# Patient Record
Sex: Female | Born: 1965 | Race: Black or African American | Hispanic: No | Smoking: Former smoker
Health system: Southern US, Community
[De-identification: ages and names within clinical notes are randomized; demographics above are authoritative.]

## PROBLEM LIST (undated history)

## (undated) DIAGNOSIS — L309 Dermatitis, unspecified: Secondary | ICD-10-CM

## (undated) DIAGNOSIS — I10 Essential (primary) hypertension: Secondary | ICD-10-CM

## (undated) DIAGNOSIS — T7840XA Allergy, unspecified, initial encounter: Secondary | ICD-10-CM

## (undated) DIAGNOSIS — Z801 Family history of malignant neoplasm of trachea, bronchus and lung: Secondary | ICD-10-CM

## (undated) DIAGNOSIS — Z8042 Family history of malignant neoplasm of prostate: Secondary | ICD-10-CM

## (undated) DIAGNOSIS — Z803 Family history of malignant neoplasm of breast: Secondary | ICD-10-CM

## (undated) HISTORY — DX: Family history of malignant neoplasm of trachea, bronchus and lung: Z80.1

## (undated) HISTORY — DX: Family history of malignant neoplasm of breast: Z80.3

## (undated) HISTORY — DX: Allergy, unspecified, initial encounter: T78.40XA

## (undated) HISTORY — DX: Essential (primary) hypertension: I10

## (undated) HISTORY — PX: KNEE SURGERY: SHX244

## (undated) HISTORY — DX: Family history of malignant neoplasm of prostate: Z80.42

## (undated) HISTORY — PX: TUBAL LIGATION: SHX77

## (undated) HISTORY — PX: KNEE ARTHROSCOPY W/ ACL RECONSTRUCTION: SHX1858

## (undated) HISTORY — DX: Dermatitis, unspecified: L30.9

## (undated) HISTORY — PX: ABDOMINAL HYSTERECTOMY: SHX81

---

## 1998-01-06 ENCOUNTER — Emergency Department (HOSPITAL_COMMUNITY): Admission: EM | Admit: 1998-01-06 | Discharge: 1998-01-06 | Payer: Self-pay | Admitting: Emergency Medicine

## 1998-01-06 ENCOUNTER — Encounter: Payer: Self-pay | Admitting: Emergency Medicine

## 1998-07-23 ENCOUNTER — Ambulatory Visit (HOSPITAL_COMMUNITY): Admission: RE | Admit: 1998-07-23 | Discharge: 1998-07-23 | Payer: Self-pay | Admitting: *Deleted

## 1998-11-30 ENCOUNTER — Encounter: Payer: Self-pay | Admitting: Emergency Medicine

## 1998-11-30 ENCOUNTER — Emergency Department (HOSPITAL_COMMUNITY): Admission: EM | Admit: 1998-11-30 | Discharge: 1998-11-30 | Payer: Self-pay

## 1999-07-26 ENCOUNTER — Other Ambulatory Visit: Admission: RE | Admit: 1999-07-26 | Discharge: 1999-07-26 | Payer: Self-pay | Admitting: *Deleted

## 2001-10-03 ENCOUNTER — Other Ambulatory Visit: Admission: RE | Admit: 2001-10-03 | Discharge: 2001-10-03 | Payer: Self-pay | Admitting: *Deleted

## 2002-09-24 ENCOUNTER — Other Ambulatory Visit: Admission: RE | Admit: 2002-09-24 | Discharge: 2002-09-24 | Payer: Self-pay | Admitting: *Deleted

## 2004-03-30 ENCOUNTER — Other Ambulatory Visit: Admission: RE | Admit: 2004-03-30 | Discharge: 2004-03-30 | Payer: Self-pay | Admitting: Obstetrics and Gynecology

## 2004-11-01 ENCOUNTER — Other Ambulatory Visit: Admission: RE | Admit: 2004-11-01 | Discharge: 2004-11-01 | Payer: Self-pay | Admitting: Obstetrics and Gynecology

## 2005-04-21 ENCOUNTER — Other Ambulatory Visit: Admission: RE | Admit: 2005-04-21 | Discharge: 2005-04-21 | Payer: Self-pay | Admitting: Obstetrics and Gynecology

## 2008-05-14 ENCOUNTER — Encounter (INDEPENDENT_AMBULATORY_CARE_PROVIDER_SITE_OTHER): Payer: Self-pay | Admitting: Obstetrics and Gynecology

## 2008-05-14 ENCOUNTER — Ambulatory Visit (HOSPITAL_COMMUNITY): Admission: RE | Admit: 2008-05-14 | Discharge: 2008-05-15 | Payer: Self-pay | Admitting: Obstetrics and Gynecology

## 2010-06-24 LAB — CBC
HCT: 35.4 % — ABNORMAL LOW (ref 36.0–46.0)
HCT: 40.3 % (ref 36.0–46.0)
Hemoglobin: 11.9 g/dL — ABNORMAL LOW (ref 12.0–15.0)
Hemoglobin: 13.5 g/dL (ref 12.0–15.0)
MCHC: 33.5 g/dL (ref 30.0–36.0)
MCHC: 33.5 g/dL (ref 30.0–36.0)
MCV: 93.8 fL (ref 78.0–100.0)
MCV: 94.9 fL (ref 78.0–100.0)
Platelets: 182 10*3/uL (ref 150–400)
Platelets: 232 10*3/uL (ref 150–400)
RBC: 3.73 MIL/uL — ABNORMAL LOW (ref 3.87–5.11)
RBC: 4.3 MIL/uL (ref 3.87–5.11)
RDW: 12.3 % (ref 11.5–15.5)
RDW: 12.6 % (ref 11.5–15.5)
WBC: 12.1 10*3/uL — ABNORMAL HIGH (ref 4.0–10.5)
WBC: 6.7 10*3/uL (ref 4.0–10.5)

## 2010-06-24 LAB — PREGNANCY, URINE: Preg Test, Ur: NEGATIVE

## 2010-07-27 NOTE — H&P (Signed)
[name], [name]                ACCOUNT NO.:  [id number]   MEDICAL RECORD NO.:  [id number]          PATIENT TYPE:  AMB   LOCATION:  SDC                           FACILITY:  [facility]   PHYSICIAN:  Zenaida Niece, M.D.DATE OF BIRTH:  March 11, 1966   DATE OF ADMISSION:  05/14/2008  DATE OF DISCHARGE:                              HISTORY & PHYSICAL   CHIEF COMPLAINT:  Abnormal uterine bleeding and uterine myomas.   HISTORY OF PRESENT ILLNESS:  This is a 45 year old female para 2-0-0-2  who was last seen for an annual exam in March 2009.  At that time, she  was not having any significant problems.  She called in January of this  year saying for the last 2 months periods were lasting longer with  irregular bleeding and then spotting for days after.  She was seen in  February of this year for a pelvic ultrasound and endometrial biopsy.  The ultrasound revealed an anterior fibroid which was partially  submucosal.  Uterus was slightly enlarged, but an otherwise normal  ultrasound.  Endometrial biopsy sounded to 10 cm, got mostly blood, but  pathology reveals benign endometrium with breakdown.  All nonsurgical  and surgical options were discussed with the patient and she wishes to  proceed with definitive surgical therapy.  She is being admitted for  total laparoscopic hysterectomy at this time.   PAST OBSTETRICS HISTORY:  Two cesarean sections at term without  complications.   PAST GYNECOLOGICAL HISTORY:  No history of abnormal Pap smears or  sexually transmitted diseases.   PAST MEDICAL HISTORY:  No significant illnesses.   PAST SURGICAL HISTORY:  Cesarean section x2, bilateral tubal ligation  and knee surgery.   ALLERGIES:  None known.   CURRENT MEDICATIONS:  None.   REVIEW OF SYSTEMS:  She has currently normal bowel and bladder function.  No chest pain or shortness of breath.   FAMILY HISTORY:  Mom with breast cancer.   SOCIAL HISTORY:  She is divorced and she does smoke about  five  cigarettes a day.   PHYSICAL EXAMINATION:  VITAL SIGNS:  Weight is approximately 189 pounds,  blood pressure was 122/84.  GENERAL:  This is a well-developed female in no acute distress.  NECK:  Supple without lymphadenopathy or thyromegaly.  LUNGS:  Clear to auscultation.  HEART:  Regular rate and rhythm without murmur.  ABDOMEN:  Soft, nontender, nondistended without palpable masses.  She  does have a well-healed transverse scar.  EXTREMITIES:  No edema and are nontender.  PELVIC:  External genitalia has no lesions.  On pelvic exam she has  normal cervix with minimal uterine descensus.  On bimanual exam, the  uterus is slightly enlarged and nontender.  She has no adnexal masses  and again there is minimal descensus.   ASSESSMENT:  Abnormal uterine bleeding with uterine leiomyoma.  The  patient wishes to undergo definitive surgical therapy.  All surgical  options and all risks of surgery have been discussed, and she agrees to  proceed.   PLAN:  Admit the patient on March 3 for total laparoscopic hysterectomy.  Zenaida Niece, M.D.  Electronically Signed     TDM/MEDQ  D:  05/13/2008  T:  05/13/2008  Job:  [id number]

## 2010-07-27 NOTE — Op Note (Signed)
[name], [name]                ACCOUNT NO.:  [id number]   MEDICAL RECORD NO.:  [id number]          PATIENT TYPE:  OIB   LOCATION:  9316                          FACILITY:  [facility]   PHYSICIAN:  Zenaida Niece, M.D.DATE OF BIRTH:  [birth year]   DATE OF PROCEDURE:  05/14/2008  DATE OF DISCHARGE:                               OPERATIVE REPORT   PREOPERATIVE DIAGNOSES:  1. Abnormal uterine bleeding.  2. Uterine myoma.   POSTOPERATIVE DIAGNOSES:  1. Abnormal uterine bleeding.  2. Uterine myoma.   PROCEDURE:  Total laparoscopic hysterectomy.   SURGEON:  Zenaida Niece, MD   ASSISTANT:  Huel Cote, M.D.   ANESTHESIA:  General endotracheal tube.   FINDINGS:  She had a slightly enlarged uterus with normal tubes and  ovaries with evidence of prior tubal ligation.  She had a normal  appendix and upper abdomen.   SPECIMENS:  Uterus with cervix sent for routine pathology.   ESTIMATED BLOOD LOSS:  100 mL.   COMPLICATIONS:  None.   PROCEDURE IN DETAIL:  The patient was taken to the operating room and  placed in the dorsal supine position.  General anesthesia was induced.  Both arms were tucked to her sides and legs were placed in mobile  stirrups.  Abdomen, perineum, and vagina were then prepped and draped in  the usual sterile fashion and a Foley catheter was inserted.  A sponge  stick was placed into the vagina for location of the vaginal fornices  later.  Infraumbilical skin was infiltrated with 0.25% Marcaine and a  0.75 cm vertical incision was made.  The Veress needle was inserted into  the peritoneal cavity and placement confirmed by the water-drop testing  and opening pressure of 6 mmHg.  CO2 gas was insufflated to a pressure  of 14 mmHg and the Veress needle was removed.  A 5-mm trocar was then  introduced with direct visualization of the laparoscope.  Inspection  revealed the above-mentioned findings.  A 5-mm port was placed on the  right side under direct  visualization and a 10-mm port on the left side  also under direct visualization.  Uterus did appear to be amendable to  laparoscopic hysterectomy.  Using the Harmonic scalpel ACE, the left  utero-ovarian ligament, round ligament, and upper-broad ligaments were  taken down and the Harmonic scalpel was used to assure hemostasis.  The  anterior leaf of the broad ligament was incised to cross the anterior  portion of the uterus to help push the bladder inferior.  The uterine  artery was identified, skeletonized, and taken down with the Harmonic  scalpel ACE with adequate division and adequate hemostasis.  A similar  procedure was then performed on the right side.  While this was being  done, the assistant used a 5-mm tenaculum on the uterus for  mobilization.  Once the uterine arteries were taken down on the right  side, bleeding was controlled with Harmonic scalpel.  The bladder was  pushed further inferior.  I then took the Harmonic scalpel back to the  left side.  The sponge stick was placed  into the anterior fornix/cul-de-  sac and pushed up into the field.  Using the Harmonic scalpel ACE on  maximum power, I was able to enter the vagina and the anterior cul-de-  sac.  This incision was extended short ways on either side.  The uterus  was then flipped anterior after the pelvis was irrigated.  The sponge  stick was placed into the posterior cul-de-sac and again just below the  level of the uterosacral ligaments, I was able to enter the posterior  cul-de-sac with Harmonic scalpel ACE on maximum power.  Then, using the  Harmonic scalpel ACE on maximum and minimum power needed for hemostasis,  the anterior and posterior incisions were connected first on the left  side taking down cardinal ligament and uterosacral ligaments and then on  the right side doing the same thing.  This removed the uterus and cervix  from the vagina.  It was then necessary for me to put on overgloves and  go from  below.  Using Deaver retractors, I was able to see the uterus  and cervix in the vaginal incision.  These were grasped with large  tenaculum and pulled through and into the vagina with some difficulty.  However, I was eventually able to achieve this and remove the uterus  through the vagina.  A moist sponge was then placed into the vagina to  help maintain pneumoperitoneum.  I removed the overgloves.   The pelvis was irrigated.  I then used 0 Vicryl suture through the  laparoscope, starting from the patient's right side at the right angle,  and I did 4 figure-of-eight sutures across the vaginal cuff closing it  with adequate closure and adequate hemostasis.  I used extracorporeal  knot-tying with a knot pusher.  This achieved adequate closure and  adequate hemostasis.  Pelvis was irrigated.  Small areas of bleeding  were controlled with Harmonic scalpel ACE.  Both ureters were identified  and seen to peristalse well below any incision lines.  Pelvis was again  inspected and found to be hemostatic.  The 5-mm trocar in the right was  removed under direct visualization.  The 10-mm trocar on the left was  removed under direct visualization.  All gas was allowed to deflate from  the abdomen and the umbilical trocar was removed.  The skin incisions  were closed with interrupted subcuticular sutures of 4-0 Vicryl followed  by Dermabond.  The patient was awakened in the operating room after  tolerating the procedure well and was taken to the recovery room in  stable condition.  Counts were correct, she received Ancef 1 g IV at the  beginning of the procedure and had PAS hose on throughout the procedure.      Zenaida Niece, M.D.  Electronically Signed     TDM/MEDQ  D:  05/14/2008  T:  05/15/2008  Job:  [id number]

## 2012-08-07 ENCOUNTER — Ambulatory Visit (INDEPENDENT_AMBULATORY_CARE_PROVIDER_SITE_OTHER): Payer: BC Managed Care – PPO | Admitting: Family Medicine

## 2012-08-07 VITALS — BP 118/62 | HR 80 | Temp 97.8°F | Resp 16 | Ht 68.5 in | Wt 183.2 lb

## 2012-08-07 DIAGNOSIS — J329 Chronic sinusitis, unspecified: Secondary | ICD-10-CM

## 2012-08-07 MED ORDER — CETIRIZINE HCL 10 MG PO TABS: 10.0000 mg | ORAL_TABLET | Freq: Every day | ORAL | Status: DC

## 2012-08-07 MED ORDER — AMOXICILLIN-POT CLAVULANATE 875-125 MG PO TABS: 1.0000 | ORAL_TABLET | Freq: Two times a day (BID) | ORAL | Status: DC

## 2012-08-07 NOTE — Patient Instructions (Addendum)

## 2012-08-07 NOTE — Progress Notes (Signed)
SUBJECTIVE:  [name] is a 47 y.o. female who complains of congestion, sore throat, swollen glands, nasal blockage, post nasal drip, productive cough, headache, bilateral sinus pain and chills for 6 days. She denies a history of chest pain, fevers, nausea, vomiting, weakness and weight loss and denies a history of asthma. Patient has smoke cigarettes.   OBJECTIVE: She appears well, vital signs are as noted. Ears air fluid level noted, non infectious.  Throat and pharynx + erythema with PND.  Neck supple. No adenopathy in the neck. Nose is congested. Sinuses tender to palpation frontal bilateral. The chest is clear, without wheezes or rales.  ASSESSMENT:  sinusitis  PLAN: Symptomatic therapy suggested: push fluids, rest, return office visit prn if symptoms persist or worsen and augmentin and zyrtec given. Call or return to clinic prn if these symptoms worsen or fail to improve as anticipated.

## 2012-08-08 ENCOUNTER — Telehealth: Payer: Self-pay | Admitting: Radiology

## 2012-08-08 NOTE — Telephone Encounter (Signed)
Work note provided for her for today.

## 2012-10-03 LAB — HM MAMMOGRAPHY

## 2012-10-12 ENCOUNTER — Encounter: Payer: Self-pay | Admitting: *Deleted

## 2013-01-29 ENCOUNTER — Other Ambulatory Visit: Payer: Self-pay | Admitting: Family Medicine

## 2013-02-05 ENCOUNTER — Other Ambulatory Visit: Payer: Self-pay | Admitting: *Deleted

## 2013-02-05 DIAGNOSIS — R5381 Other malaise: Secondary | ICD-10-CM

## 2013-02-05 DIAGNOSIS — E785 Hyperlipidemia, unspecified: Secondary | ICD-10-CM

## 2013-02-06 ENCOUNTER — Other Ambulatory Visit: Payer: Self-pay

## 2013-02-06 LAB — CBC WITH DIFFERENTIAL/PLATELET
Basophils Absolute: 0 10*3/uL (ref 0.0–0.1)
Basophils Relative: 0 % (ref 0–1)
Eosinophils Absolute: 0.2 10*3/uL (ref 0.0–0.7)
Eosinophils Relative: 3 % (ref 0–5)
HCT: 36.7 % (ref 36.0–46.0)
Hemoglobin: 12 g/dL (ref 12.0–15.0)
Lymphocytes Relative: 40 % (ref 12–46)
Lymphs Abs: 2 10*3/uL (ref 0.7–4.0)
MCH: 29.6 pg (ref 26.0–34.0)
MCHC: 32.7 g/dL (ref 30.0–36.0)
MCV: 90.4 fL (ref 78.0–100.0)
Monocytes Absolute: 0.7 10*3/uL (ref 0.1–1.0)
Monocytes Relative: 14 % — ABNORMAL HIGH (ref 3–12)
Neutro Abs: 2.1 10*3/uL (ref 1.7–7.7)
Neutrophils Relative %: 43 % (ref 43–77)
Platelets: 249 10*3/uL (ref 150–400)
RBC: 4.06 MIL/uL (ref 3.87–5.11)
RDW: 13 % (ref 11.5–15.5)
WBC: 5 10*3/uL (ref 4.0–10.5)

## 2013-02-06 LAB — LIPID PANEL
Cholesterol: 180 mg/dL (ref 0–200)
HDL: 63 mg/dL (ref >39)
LDL Cholesterol: 104 mg/dL — ABNORMAL HIGH (ref 0–99)
Total CHOL/HDL Ratio: 2.9 Ratio
Triglycerides: 66 mg/dL (ref <150)
VLDL: 13 mg/dL (ref 0–40)

## 2013-02-06 LAB — COMPLETE METABOLIC PANEL WITH GFR
ALT: 20 U/L (ref 0–35)
AST: 23 U/L (ref 0–37)
Albumin: 4.3 g/dL (ref 3.5–5.2)
Alkaline Phosphatase: 68 U/L (ref 39–117)
BUN: 11 mg/dL (ref 6–23)
CO2: 27 mEq/L (ref 19–32)
Calcium: 9.7 mg/dL (ref 8.4–10.5)
Chloride: 103 mEq/L (ref 96–112)
Creat: 0.59 mg/dL (ref 0.50–1.10)
GFR, Est African American: >89 mL/min
GFR, Est Non African American: >89 mL/min
Glucose, Bld: 85 mg/dL (ref 70–99)
Potassium: 3.9 mEq/L (ref 3.5–5.3)
Sodium: 138 mEq/L (ref 135–145)
Total Bilirubin: 0.3 mg/dL (ref 0.3–1.2)
Total Protein: 7 g/dL (ref 6.0–8.3)

## 2013-02-07 LAB — TSH: TSH: 2.585 u[IU]/mL (ref 0.350–4.500)

## 2013-02-11 ENCOUNTER — Encounter: Payer: Self-pay | Admitting: Family Medicine

## 2013-02-11 ENCOUNTER — Ambulatory Visit (INDEPENDENT_AMBULATORY_CARE_PROVIDER_SITE_OTHER): Payer: BC Managed Care – PPO | Admitting: Family Medicine

## 2013-02-11 ENCOUNTER — Encounter (INDEPENDENT_AMBULATORY_CARE_PROVIDER_SITE_OTHER): Payer: Self-pay

## 2013-02-11 VITALS — BP 117/76 | HR 79 | Resp 16 | Ht 68.5 in | Wt 176.0 lb

## 2013-02-11 DIAGNOSIS — N760 Acute vaginitis: Secondary | ICD-10-CM

## 2013-02-11 DIAGNOSIS — F17200 Nicotine dependence, unspecified, uncomplicated: Secondary | ICD-10-CM

## 2013-02-11 DIAGNOSIS — B9689 Other specified bacterial agents as the cause of diseases classified elsewhere: Secondary | ICD-10-CM

## 2013-02-11 DIAGNOSIS — R5383 Other fatigue: Secondary | ICD-10-CM

## 2013-02-11 DIAGNOSIS — I10 Essential (primary) hypertension: Secondary | ICD-10-CM

## 2013-02-11 DIAGNOSIS — L259 Unspecified contact dermatitis, unspecified cause: Secondary | ICD-10-CM

## 2013-02-11 DIAGNOSIS — R5381 Other malaise: Secondary | ICD-10-CM | POA: Insufficient documentation

## 2013-02-11 DIAGNOSIS — A499 Bacterial infection, unspecified: Secondary | ICD-10-CM

## 2013-02-11 DIAGNOSIS — L309 Dermatitis, unspecified: Secondary | ICD-10-CM | POA: Insufficient documentation

## 2013-02-11 MED ORDER — CLOBETASOL PROPIONATE 0.05 % EX OINT: 1.0000 "application " | TOPICAL_OINTMENT | Freq: Two times a day (BID) | CUTANEOUS | Status: DC

## 2013-02-11 MED ORDER — BUPROPION HCL ER (XL) 150 MG PO TB24: 150.0000 mg | ORAL_TABLET | ORAL | Status: DC

## 2013-02-11 MED ORDER — LOSARTAN POTASSIUM-HCTZ 100-12.5 MG PO TABS: ORAL_TABLET | ORAL | Status: DC

## 2013-02-11 MED ORDER — BUPROPION HCL ER (XL) 300 MG PO TB24: 300.0000 mg | ORAL_TABLET | ORAL | Status: DC

## 2013-02-11 NOTE — Progress Notes (Signed)
   Subjective:    Patient ID: [name], female    DOB: March 12, 1966, 47 y.o.   MRN: [id number]  HPI  [name] is here today to go over her most recent lab results and to discuss the conditions listed below:   1)  Hypertension:  She is taking Hyzaar (100-12.5, 1/2 pill daily).  She was originally supposed to take 1 pill daily but she was having severe lightheadedness.    She started cutting her dose in half and has her side effects have improved.    2)  Eczema:  She has been using her Clobetasol (0.05%).  She needs a refill on it.    3)  BV:  She has done well taking Boric Acid and needs a refill on it.     Review of Systems  Genitourinary: Negative for vaginal discharge.  Skin: Positive for rash.  Neurological: Negative for light-headedness.  All other systems reviewed and are negative.   Past Medical History, Past Surgical History, Family History, Social History and Medications were reviewed and update at this visit.       Objective:   Physical Exam  Vitals reviewed. Constitutional: She appears well-developed and well-nourished. No distress.  HENT:  Head: Normocephalic.  Eyes: No scleral icterus.  Neck: No thyromegaly present.  Cardiovascular: Normal rate, regular rhythm and normal heart sounds.   Pulmonary/Chest: Effort normal and breath sounds normal.  Musculoskeletal: She exhibits no edema and no tenderness.  Neurological: She is alert.  Skin: Skin is warm and dry.  Psychiatric: She has a normal mood and affect. Her behavior is normal. Judgment and thought content normal.          Assessment & Plan:

## 2013-02-11 NOTE — Assessment & Plan Note (Signed)
She would like to quit smoking.  She was unable to take Chantix because it made her have hallucinations of spiders.

## 2013-02-11 NOTE — Patient Instructions (Signed)
1)  BP -  Keep track of your BP.  If you continue to lose weight and stop smoking, you can probably stop the losartan/HCT.    2)  Smoking Cessation - Try the Wellbutrin XL 150 mg.  You can remain on this dosage or increase to 300 mg in a month if needed.    Smoking Cessation, Tips for Success YOU CAN QUIT SMOKING If you are ready to quit smoking, congratulations! You have chosen to help yourself be healthier. Cigarettes bring nicotine, tar, carbon monoxide, and other irritants into your body. Your lungs, heart, and blood vessels will be able to work better without these poisons. There are many different ways to quit smoking. Nicotine gum, nicotine patches, a nicotine inhaler, or nicotine nasal spray can help with physical craving. Hypnosis, support groups, and medicines help break the habit of smoking. Here are some tips to help you quit for good.  Throw away all cigarettes.  Clean and remove all ashtrays from your home, work, and car.  On a card, write down your reasons for quitting. Carry the card with you and read it when you get the urge to smoke.  Cleanse your body of nicotine. Drink enough water and fluids to keep your urine clear or pale yellow. Do this after quitting to flush the nicotine from your body.  Learn to predict your moods. Do not let a bad situation be your excuse to have a cigarette. Some situations in your life might tempt you into wanting a cigarette.  Never have "just one" cigarette. It leads to wanting another and another. Remind yourself of your decision to quit.  Change habits associated with smoking. If you smoked while driving or when feeling stressed, try other activities to replace smoking. Stand up when drinking your coffee. Brush your teeth after eating. Sit in a different chair when you read the paper. Avoid alcohol while trying to quit, and try to drink fewer caffeinated beverages. Alcohol and caffeine may urge you to smoke.  Avoid foods and drinks that can  trigger a desire to smoke, such as sugary or spicy foods and alcohol.  Ask people who smoke not to smoke around you.  Have something planned to do right after eating or having a cup of coffee. Take a walk or exercise to perk you up. This will help to keep you from overeating.  Try a relaxation exercise to calm you down and decrease your stress. Remember, you may be tense and nervous for the first 2 weeks after you quit, but this will pass.  Find new activities to keep your hands busy. Play with a pen, coin, or rubber band. Doodle or draw things on paper.  Brush your teeth right after eating. This will help cut down on the craving for the taste of tobacco after meals. You can try mouthwash, too.  Use oral substitutes, such as lemon drops, carrots, a cinnamon stick, or chewing gum, in place of cigarettes. Keep them handy so they are available when you have the urge to smoke.  When you have the urge to smoke, try deep breathing.  Designate your home as a nonsmoking area.  If you are a heavy smoker, ask your caregiver about a prescription for nicotine chewing gum. It can ease your withdrawal from nicotine.  Reward yourself. Set aside the cigarette money you save and buy yourself something nice.  Look for support from others. Join a support group or smoking cessation program. Ask someone at home or at work  to help you with your plan to quit smoking.  Always ask yourself, "Do I need this cigarette or is this just a reflex?" Tell yourself, "Today, I choose not to smoke," or "I do not want to smoke." You are reminding yourself of your decision to quit, even if you do smoke a cigarette. HOW WILL I FEEL WHEN I QUIT SMOKING?  The benefits of not smoking start within days of quitting.  You may have symptoms of withdrawal because your body is used to nicotine (the addictive substance in cigarettes). You may crave cigarettes, be irritable, feel very hungry, cough often, get headaches, or have  difficulty concentrating.  The withdrawal symptoms are only temporary. They are strongest when you first quit but will go away within 10 to 14 days.  When withdrawal symptoms occur, stay in control. Think about your reasons for quitting. Remind yourself that these are signs that your body is healing and getting used to being without cigarettes.  Remember that withdrawal symptoms are easier to treat than the major diseases that smoking can cause.  Even after the withdrawal is over, expect periodic urges to smoke. However, these cravings are generally short-lived and will go away whether you smoke or not. Do not smoke!  If you relapse and smoke again, do not lose hope. Most smokers quit 3 times before they are successful.  If you relapse, do not give up! Plan ahead and think about what you will do the next time you get the urge to smoke. LIFE AS A NONSMOKER: MAKE IT FOR A MONTH, MAKE IT FOR LIFE Day 1: Hang this page where you will see it every day. Day 2: Get rid of all ashtrays, matches, and lighters. Day 3: Drink water. Breathe deeply between sips. Day 4: Avoid places with smoke-filled air, such as bars, clubs, or the smoking section of restaurants. Day 5: Keep track of how much money you save by not smoking. Day 6: Avoid boredom. Keep a good book with you or go to the movies. Day 7: Reward yourself! One week without smoking! Day 8: Make a dental appointment to get your teeth cleaned. Day 9: Decide how you will turn down a cigarette before it is offered to you. Day 10: Review your reasons for quitting. Day 11: Distract yourself. Stay active to keep your mind off smoking and to relieve tension. Take a walk, exercise, read a book, do a crossword puzzle, or try a new hobby. Day 12: Exercise. Get off the bus before your stop or use stairs instead of escalators. Day 13: Call on friends for support and encouragement. Day 14: Reward yourself! Two weeks without smoking! Day 15: Practice deep  breathing exercises. Day 16: Bet a friend that you can stay a nonsmoker. Day 17: Ask to sit in nonsmoking sections of restaurants. Day 18: Hang up "No Smoking" signs. Day 19: Think of yourself as a nonsmoker. Day 20: Each morning, tell yourself you will not smoke. Day 21: Reward yourself! Three weeks without smoking! Day 22: Think of smoking in negative ways. Remember how it stains your teeth, gives you bad breath, and leaves you short of breath. Day 23: Eat a nutritious breakfast. Day 24:Do not relive your days as a smoker. Day 25: Hold a pencil in your hand when talking on the telephone. Day 26: Tell all your friends you do not smoke. Day 27: Think about how much better food tastes. Day 28: Remember, one cigarette is one too many. Day 29: Take up a  hobby that will keep your hands busy. Day 30: Congratulations! One month without smoking! Give yourself a big reward. Your caregiver can direct you to community resources or hospitals for support, which may include:  Group support.  Education.  Hypnosis.  Subliminal therapy. Document Released: 11/27/2003 Document Revised: 05/23/2011 Document Reviewed: 08/16/2012 [facility] Patient Information 2014 [place], [place].

## 2013-02-11 NOTE — Assessment & Plan Note (Signed)
She was given a prescription for Boric Acid vaginal capsules.

## 2013-02-11 NOTE — Assessment & Plan Note (Signed)
Refilled clobetasol ointment. 

## 2013-02-11 NOTE — Assessment & Plan Note (Signed)
We'll see if she has more energy on Wellbutrin.

## 2013-02-11 NOTE — Assessment & Plan Note (Signed)
Her BP is perfect on 1/2 of her previous dosage.  If she continues to lose weight, she may be able to discontinue her Hyzaar.

## 2013-09-23 ENCOUNTER — Other Ambulatory Visit: Payer: Self-pay | Admitting: *Deleted

## 2013-09-23 DIAGNOSIS — I10 Essential (primary) hypertension: Secondary | ICD-10-CM

## 2013-09-24 ENCOUNTER — Other Ambulatory Visit: Payer: BC Managed Care – PPO

## 2013-09-24 LAB — CBC WITH DIFFERENTIAL/PLATELET
Basophils Absolute: 0 10*3/uL (ref 0.0–0.1)
Basophils Relative: 1 % (ref 0–1)
Eosinophils Absolute: 0.4 10*3/uL (ref 0.0–0.7)
Eosinophils Relative: 10 % — ABNORMAL HIGH (ref 0–5)
HCT: 37.2 % (ref 36.0–46.0)
Hemoglobin: 12.2 g/dL (ref 12.0–15.0)
Lymphocytes Relative: 48 % — ABNORMAL HIGH (ref 12–46)
Lymphs Abs: 2.1 10*3/uL (ref 0.7–4.0)
MCH: 29.3 pg (ref 26.0–34.0)
MCHC: 32.8 g/dL (ref 30.0–36.0)
MCV: 89.2 fL (ref 78.0–100.0)
Monocytes Absolute: 0.4 10*3/uL (ref 0.1–1.0)
Monocytes Relative: 9 % (ref 3–12)
Neutro Abs: 1.4 10*3/uL — ABNORMAL LOW (ref 1.7–7.7)
Neutrophils Relative %: 32 % — ABNORMAL LOW (ref 43–77)
Platelets: 265 10*3/uL (ref 150–400)
RBC: 4.17 MIL/uL (ref 3.87–5.11)
RDW: 13 % (ref 11.5–15.5)
WBC: 4.4 10*3/uL (ref 4.0–10.5)

## 2013-09-24 LAB — COMPLETE METABOLIC PANEL WITH GFR
ALT: 19 U/L (ref 0–35)
AST: 20 U/L (ref 0–37)
Albumin: 3.9 g/dL (ref 3.5–5.2)
Alkaline Phosphatase: 72 U/L (ref 39–117)
BUN: 8 mg/dL (ref 6–23)
CO2: 28 mEq/L (ref 19–32)
Calcium: 9.5 mg/dL (ref 8.4–10.5)
Chloride: 109 mEq/L (ref 96–112)
Creat: 0.69 mg/dL (ref 0.50–1.10)
GFR, Est African American: >89 mL/min
GFR, Est Non African American: >89 mL/min
Glucose, Bld: 92 mg/dL (ref 70–99)
Potassium: 4.9 mEq/L (ref 3.5–5.3)
Sodium: 141 mEq/L (ref 135–145)
Total Bilirubin: 0.3 mg/dL (ref 0.2–1.2)
Total Protein: 6.3 g/dL (ref 6.0–8.3)

## 2013-10-04 ENCOUNTER — Ambulatory Visit (INDEPENDENT_AMBULATORY_CARE_PROVIDER_SITE_OTHER): Payer: BC Managed Care – PPO | Admitting: Family Medicine

## 2013-10-04 ENCOUNTER — Encounter: Payer: Self-pay | Admitting: Family Medicine

## 2013-10-04 VITALS — BP 140/85 | HR 69 | Resp 16 | Ht 68.5 in | Wt 188.0 lb

## 2013-10-04 DIAGNOSIS — L309 Dermatitis, unspecified: Secondary | ICD-10-CM

## 2013-10-04 DIAGNOSIS — R5383 Other fatigue: Secondary | ICD-10-CM

## 2013-10-04 DIAGNOSIS — I10 Essential (primary) hypertension: Secondary | ICD-10-CM

## 2013-10-04 DIAGNOSIS — R5381 Other malaise: Secondary | ICD-10-CM

## 2013-10-04 DIAGNOSIS — F17200 Nicotine dependence, unspecified, uncomplicated: Secondary | ICD-10-CM

## 2013-10-04 DIAGNOSIS — L259 Unspecified contact dermatitis, unspecified cause: Secondary | ICD-10-CM

## 2013-10-04 MED ORDER — BUPROPION HCL ER (XL) 300 MG PO TB24: 300.0000 mg | ORAL_TABLET | ORAL | Status: DC

## 2013-10-04 MED ORDER — CLOBETASOL PROPIONATE 0.05 % EX OINT: 1.0000 "application " | TOPICAL_OINTMENT | Freq: Two times a day (BID) | CUTANEOUS | Status: AC

## 2013-10-04 MED ORDER — LOSARTAN POTASSIUM-HCTZ 100-12.5 MG PO TABS: ORAL_TABLET | ORAL | Status: DC

## 2013-10-04 NOTE — Progress Notes (Signed)
Subjective:    Patient ID: [name], female    DOB: Jan 04, 1966, 48 y.o.   MRN: [id number]  HPI  [name] is here today to follow up on her recent lab results. She is needing to get some medication refills.   1)  Hypertension:  She takes Hyzaar for her BP.  She was taking 1/2 tab daily but recently decreased this to 1/4 tab because she felt that the 1/2 dropped her pressure too much.  Her pressure is higher today because she has   2)  Mood/Fatigue:  She continues to do well on the Wellbutrin. She would like to remain on it.   3)  Eczema:  She would like to get a refill on the clobetasol ointment.   Review of Systems  Constitutional: Negative for activity change, appetite change and fatigue.  Cardiovascular: Negative for chest pain, palpitations and leg swelling.  Psychiatric/Behavioral: Negative for behavioral problems. The patient is not nervous/anxious.   All other systems reviewed and are negative.    Past Medical History  Diagnosis Date  . Allergy   . Hypertension   . Eczema      Past Surgical History  Procedure Laterality Date  . Cesarean section    . Abdominal hysterectomy    . Tubal ligation    . Knee arthroscopy w/ acl reconstruction Left      History   Social History Narrative   Marital Status: Divorced    Children:  Sons (2)    Pets: None   Living Situation: Lives alone   Occupation: Shipping Clerk (Financial trader)     Education: Programmer, systems    Tobacco Use/Exposure:  She has smoked as much 1 ppd.  She has smoked for > 20 years.  She is trying to quit and is down to about 3-5 cigs per day.     Alcohol Use: 4 days/months  (3 drinks)    Drug Use:  None   Diet:  Regular   Exercise:  Walking/ Zumba/Basketball, Biking   Hobbies: Dancing/ Cooking                  Family History  Problem Relation Age of Onset  . Cancer Mother 40    Breast Cancer  . Diabetes Father   . Kidney disease Father   . Hypertension Sister   . Heart disease  Maternal Grandmother   . Cancer Maternal Aunt     Lung Cancer  . Heart disease Maternal Aunt      No Known Allergies      Objective:   Physical Exam  Vitals reviewed. Constitutional: She appears well-developed and well-nourished. No distress.  HENT:  Head: Normocephalic.  Eyes: No scleral icterus.  Neck: No thyromegaly present.  Cardiovascular: Normal rate, regular rhythm and normal heart sounds.   Pulmonary/Chest: Effort normal and breath sounds normal.  Musculoskeletal: She exhibits no edema and no tenderness.  Neurological: She is alert.  Skin: Skin is warm and dry.  Psychiatric: She has a normal mood and affect. Her behavior is normal. Judgment and thought content normal.      Assessment & Plan:    [name] was seen today for medication management.  Diagnoses and associated orders for this visit:  Essential hypertension, benign - losartan-hydrochlorothiazide (HYZAAR) 100-12.5 MG per tablet; Take 1/2 - 1 tab daily  Eczema - clobetasol ointment (TEMOVATE) 0.05 %; Apply 1 application topically 2 (two) times daily.  Other malaise and fatigue - buPROPion (WELLBUTRIN XL) 300 MG  24 hr tablet; Take 1 tablet (300 mg total) by mouth every morning.  Tobacco use disorder - buPROPion (WELLBUTRIN XL) 300 MG 24 hr tablet; Take 1 tablet (300 mg total) by mouth every morning.   TIME SPENT "FACE TO FACE" WITH PATIENT -  30 MINS

## 2014-11-24 ENCOUNTER — Ambulatory Visit (INDEPENDENT_AMBULATORY_CARE_PROVIDER_SITE_OTHER): Payer: BLUE CROSS/BLUE SHIELD

## 2014-11-24 ENCOUNTER — Ambulatory Visit (INDEPENDENT_AMBULATORY_CARE_PROVIDER_SITE_OTHER): Payer: BLUE CROSS/BLUE SHIELD | Admitting: Physician Assistant

## 2014-11-24 VITALS — BP 138/72 | HR 80 | Temp 98.0°F | Resp 17 | Ht 69.0 in | Wt 172.0 lb

## 2014-11-24 DIAGNOSIS — Z23 Encounter for immunization: Secondary | ICD-10-CM

## 2014-11-24 DIAGNOSIS — M25562 Pain in left knee: Secondary | ICD-10-CM

## 2014-11-24 MED ORDER — MELOXICAM 15 MG PO TABS: 15.0000 mg | ORAL_TABLET | Freq: Every day | ORAL | Status: DC

## 2014-11-24 NOTE — Progress Notes (Signed)
[facility] [address] [place] [address] [phone]  Date:  11/24/2014   Name:  [name]   DOB:  December 06, 1965   MRN:  [id number]  PCP:  Ival Bible, MD    History of Present Illness:  [name] is a 49 y.o. female patient who presents to [facility] for chief complaint of left knee pain for the last 2 days. Patient was playing volleyball when she jumped up and she is went back down to the ground twisted her knee. She felt a popping sensation and pain and immediately ensued followed by swelling. She has been able to ambulate very little. She has pain along the lateral side of her knee as well as the top of her knee. There is no numbness or tingling. There has been some generalized swelling. She has tried Epsom salts to no avail. She is also used ice packs. Patient has had multiple surgeries on this left knee repairing her anterior cruciate ligament through arthroscopy as well as a repair to her meniscus.  Patient Active Problem List   Diagnosis Date Noted  . Essential hypertension, benign 02/11/2013  . Other malaise and fatigue 02/11/2013  . Tobacco use disorder 02/11/2013  . Eczema 02/11/2013  . Bacterial vaginosis 02/11/2013    Past Medical History  Diagnosis Date  . Allergy   . Hypertension   . Eczema     Past Surgical History  Procedure Laterality Date  . Cesarean section    . Abdominal hysterectomy    . Tubal ligation    . Knee arthroscopy w/ acl reconstruction Left     Social History  Substance Use Topics  . Smoking status: Current Some Day Smoker -- 0.25 packs/day for 36 years    Types: Cigarettes  . Smokeless tobacco: Never Used     Comment: She is trying to quit.    . Alcohol Use: 0.0 oz/week    0 Standard drinks or equivalent per week     Comment: Occasional     Family History  Problem Relation Age of Onset  . Cancer Mother 65    Breast Cancer  . Diabetes Father   . Kidney disease Father   . Hypertension Sister   .  Heart disease Maternal Grandmother   . Cancer Maternal Aunt     Lung Cancer  . Heart disease Maternal Aunt     No Known Allergies  Medication list has been reviewed and updated.  Current Outpatient Prescriptions on File Prior to Visit  Medication Sig Dispense Refill  . losartan-hydrochlorothiazide (HYZAAR) 100-12.5 MG per tablet Take 1/2 - 1 tab daily 30 tablet 5  . buPROPion (WELLBUTRIN XL) 300 MG 24 hr tablet Take 1 tablet (300 mg total) by mouth every morning. 30 tablet 5   No current facility-administered medications on file prior to visit.    ROS ROS otherwise unremarkable unless listed above.  Physical Examination: BP 138/72 mmHg  Pulse 80  Temp(Src) 98 F (36.7 C) (Oral)  Resp 17  Ht 5\' 9"  (1.753 m)  Wt 172 lb (78.019 kg)  BMI 25.39 kg/m2  SpO2 98% Ideal Body Weight: Weight in (lb) to have BMI = 25: 168.9  Physical Exam  Constitutional: She is oriented to person, place, and time. She appears well-developed and well-nourished. No distress.  HENT:  Head: Normocephalic and atraumatic.  Right Ear: External ear normal.  Left Ear: External ear normal.  Eyes: Conjunctivae and EOM are normal. Pupils are equal, round,  and reactive to light.  Cardiovascular: Normal rate.   Pulmonary/Chest: Effort normal. No respiratory distress.  Musculoskeletal:       Left knee: She exhibits decreased range of motion (Not able to ambulate.  Normal strength against resisted extension.  Resisted flexion sparks increased pain to lateral side.), swelling and abnormal meniscus (Negative mcmurrary). Tenderness found. Lateral joint line tenderness noted. No medial joint line and no patellar tendon tenderness noted. LCL: Appears to be some laxity and tenderness with adduction.  Swelling along the anterior knee along both sides of otye  Neurological: She is alert and oriented to person, place, and time.  Skin: She is not diaphoretic.  Psychiatric: She has a normal mood and affect. Her behavior is  normal.   [facility] reading (PRIMARY) by  Dr. Lorelei Pont: Degerative chagnes and hardware in place.  Assessment and Plan: 49 year old female with past medical history left knee surgery for apparent torn anterior cruciate ligament and meniscus is here today for left knee pain for 2 days. -At this time orthopedic consult is appreciated. She was referred back to Dr. Lynann Bologna of [place] in [place].  Ice 3 times for 15 minutes per day. -Work restrictions to [employer] duty given at this time, where this can then be followed by ortho specialist.   Left knee pain - Plan: DG Knee Complete 4 Views Left, Ambulatory referral to Orthopedic Surgery, meloxicam (MOBIC) 15 MG tablet, CANCELED: DG Foot Complete Left  Influenza vaccine needed - Plan: Flu Vaccine QUAD 36+ mos IM  Ivar Drape, PA-C [facility] 9/12/20168:53 PM

## 2014-11-24 NOTE — Patient Instructions (Addendum)
Please take medication as prescribed avoiding naproxen, ibuprofen, etc.  If you need another medication for pain, use tylenol. Ice the knee 3-4 times for 15 minutes per day. Rest the knee. Stay in the immobilizer while moving at this time.  You may remove at night.   Knee Pain The knee is the complex joint between your thigh and your lower leg. It is made up of bones, tendons, ligaments, and cartilage. The bones that make up the knee are:  The femur in the thigh.  The tibia and fibula in the lower leg.  The patella or kneecap riding in the groove on the lower femur. CAUSES  Knee pain is a common complaint with many causes. A few of these causes are:  Injury, such as:  A ruptured ligament or tendon injury.  Torn cartilage.  Medical conditions, such as:  Gout  Arthritis  Infections  Overuse, over training, or overdoing a physical activity. Knee pain can be minor or severe. Knee pain can accompany debilitating injury. Minor knee problems often respond well to self-care measures or get well on their own. More serious injuries may need medical intervention or even surgery. SYMPTOMS The knee is complex. Symptoms of knee problems can vary widely. Some of the problems are:  Pain with movement and weight bearing.  Swelling and tenderness.  Buckling of the knee.  Inability to straighten or extend your knee.  Your knee locks and you cannot straighten it.  Warmth and redness with pain and fever.  Deformity or dislocation of the kneecap. DIAGNOSIS  Determining what is wrong may be very straight forward such as when there is an injury. It can also be challenging because of the complexity of the knee. Tests to make a diagnosis may include:  Your caregiver taking a history and doing a physical exam.  Routine X-rays can be used to rule out other problems. X-rays will not reveal a cartilage tear. Some injuries of the knee can be diagnosed by:  Arthroscopy a surgical technique by  which a small video camera is inserted through tiny incisions on the sides of the knee. This procedure is used to examine and repair internal knee joint problems. Tiny instruments can be used during arthroscopy to repair the torn knee cartilage (meniscus).  Arthrography is a radiology technique. A contrast liquid is directly injected into the knee joint. Internal structures of the knee joint then become visible on X-ray film.  An MRI scan is a non X-ray radiology procedure in which magnetic fields and a computer produce two- or three-dimensional images of the inside of the knee. Cartilage tears are often visible using an MRI scanner. MRI scans have largely replaced arthrography in diagnosing cartilage tears of the knee.  Blood work.  Examination of the fluid that helps to lubricate the knee joint (synovial fluid). This is done by taking a sample out using a needle and a syringe. TREATMENT The treatment of knee problems depends on the cause. Some of these treatments are:  Depending on the injury, proper casting, splinting, surgery, or physical therapy care will be needed.  Give yourself adequate recovery time. Do not overuse your joints. If you begin to get sore during workout routines, back off. Slow down or do fewer repetitions.  For repetitive activities such as cycling or running, maintain your strength and nutrition.  Alternate muscle groups. For example, if you are a weight lifter, work the upper body on one day and the lower body the next.  Either tight or weak  muscles do not give the proper support for your knee. Tight or weak muscles do not absorb the stress placed on the knee joint. Keep the muscles surrounding the knee strong.  Take care of mechanical problems.  If you have flat feet, orthotics or special shoes may help. See your caregiver if you need help.  Arch supports, sometimes with wedges on the inner or outer aspect of the heel, can help. These can shift pressure away from  the side of the knee most bothered by osteoarthritis.  A brace called an "unloader" brace also may be used to help ease the pressure on the most arthritic side of the knee.  If your caregiver has prescribed crutches, braces, wraps or ice, use as directed. The acronym for this is PRICE. This means protection, rest, ice, compression, and elevation.  Nonsteroidal anti-inflammatory drugs (NSAIDs), can help relieve pain. But if taken immediately after an injury, they may actually increase swelling. Take NSAIDs with food in your stomach. Stop them if you develop stomach problems. Do not take these if you have a history of ulcers, stomach pain, or bleeding from the bowel. Do not take without your caregiver's approval if you have problems with fluid retention, heart failure, or kidney problems.  For ongoing knee problems, physical therapy may be helpful.  Glucosamine and chondroitin are over-the-counter dietary supplements. Both may help relieve the pain of osteoarthritis in the knee. These medicines are different from the usual anti-inflammatory drugs. Glucosamine may decrease the rate of cartilage destruction.  Injections of a corticosteroid drug into your knee joint may help reduce the symptoms of an arthritis flare-up. They may provide pain relief that lasts a few months. You may have to wait a few months between injections. The injections do have a small increased risk of infection, water retention, and elevated blood sugar levels.  Hyaluronic acid injected into damaged joints may ease pain and provide lubrication. These injections may work by reducing inflammation. A series of shots may give relief for as long as 6 months.  Topical painkillers. Applying certain ointments to your skin may help relieve the pain and stiffness of osteoarthritis. Ask your pharmacist for suggestions. Many over the-counter products are approved for temporary relief of arthritis pain.  In some countries, doctors often  prescribe topical NSAIDs for relief of chronic conditions such as arthritis and tendinitis. A review of treatment with NSAID creams found that they worked as well as oral medications but without the serious side effects. PREVENTION  Maintain a healthy weight. Extra pounds put more strain on your joints.  Get strong, stay limber. Weak muscles are a common cause of knee injuries. Stretching is important. Include flexibility exercises in your workouts.  Be smart about exercise. If you have osteoarthritis, chronic knee pain or recurring injuries, you may need to change the way you exercise. This does not mean you have to stop being active. If your knees ache after jogging or playing basketball, consider switching to swimming, water aerobics, or other low-impact activities, at least for a few days a week. Sometimes limiting high-impact activities will provide relief.  Make sure your shoes fit well. Choose footwear that is right for your sport.  Protect your knees. Use the proper gear for knee-sensitive activities. Use kneepads when playing volleyball or laying carpet. Buckle your seat belt every time you drive. Most shattered kneecaps occur in car accidents.  Rest when you are tired. SEEK MEDICAL CARE IF:  You have knee pain that is continual and does not  seem to be getting better.  SEEK IMMEDIATE MEDICAL CARE IF:  Your knee joint feels hot to the touch and you have a high fever. MAKE SURE YOU:   Understand these instructions.  Will watch your condition.  Will get help right away if you are not doing well or get worse. Document Released: 12/26/2006 Document Revised: 05/23/2011 Document Reviewed: 12/26/2006 [facility] Patient Information 2015 [place], [place]. This information is not intended to replace advice given to you by your health care provider. Make sure you discuss any questions you have with your health care provider.

## 2016-03-14 DIAGNOSIS — M1990 Unspecified osteoarthritis, unspecified site: Secondary | ICD-10-CM

## 2016-03-14 HISTORY — DX: Unspecified osteoarthritis, unspecified site: M19.90

## 2017-09-15 ENCOUNTER — Ambulatory Visit: Payer: BLUE CROSS/BLUE SHIELD | Admitting: Family Medicine

## 2017-09-15 VITALS — BP 114/74 | HR 92 | Temp 98.1°F | Resp 16 | Ht 69.0 in | Wt 215.0 lb

## 2017-09-15 DIAGNOSIS — T63441A Toxic effect of venom of bees, accidental (unintentional), initial encounter: Secondary | ICD-10-CM

## 2017-09-15 MED ORDER — PREDNISONE 10 MG PO TABS: ORAL_TABLET | ORAL | 0 refills | Status: DC

## 2017-09-15 MED ORDER — CETIRIZINE HCL 10 MG PO TABS: 10.0000 mg | ORAL_TABLET | Freq: Every day | ORAL | 2 refills | Status: DC

## 2017-09-15 NOTE — Patient Instructions (Addendum)
Try a cooling lotion or gel like aloe vera or diphenhydramine (topical benadryl), or even vaseline - try keeping any topical in fridge for actual cooling effect.  Try to avoid any topical with hydrocortisone at this point - more risks than benefits considering starting systemic steroid.   IF you received an x-ray today, you will receive an invoice from [facility]. Please contact [facility] at [phone] with questions or concerns regarding your invoice.   IF you received labwork today, you will receive an invoice from [facility]. Please contact [facility] at [phone] with questions or concerns regarding your invoice.   Our billing staff will not be able to assist you with questions regarding bills from these companies.  You will be contacted with the lab results as soon as they are available. The fastest way to get your results is to activate your My Chart account. Instructions are located on the last page of this paperwork. If you have not heard from [place] regarding the results in 2 weeks, please contact this office.     Bee, Wasp, or [employer], Adult Bees, wasps, and hornets are part of a family of insects that can sting people. These stings can cause pain and inflammation, but they are usually not serious. However, some people may have an allergic reaction to a sting. This can cause the symptoms to be more severe. What increases the risk? You may be at a greater risk of getting stung if you:  Provoke a stinging insect by swatting or disturbing it.  Wear strong-smelling soaps, deodorants, or body sprays.  Spend time outdoors near gardens with flowers or fruit trees or in clothes that expose skin.  Eat or drink outside.  What are the signs or symptoms? Common symptoms of this condition include:  A red lump in the skin that sometimes has a tiny hole in the center. In some cases, a stinger may be in the center of the wound.  Pain and itching at the sting  site.  Redness and swelling around the sting site. If you have an allergic reaction (localized allergic reaction), the swelling and redness may spread out from the sting site. In some cases, this reaction can continue to develop over the next 24-48 hours.  In rare cases, a person may have a severe allergic reaction (anaphylactic reaction) to a sting. Symptoms of an anaphylactic reaction may include:  Wheezing or difficulty breathing.  Raised, itchy, red patches on the skin (hives).  Nausea or vomiting.  Abdominal cramping.  Diarrhea.  Tightness in the chest or chest pain.  Dizziness or fainting.  Redness of the face (flushing).  Hoarse voice.  Swollen tongue, lips, or face.  How is this diagnosed? This condition is usually diagnosed based on your symptoms and medical history as well as a physical exam. You may have an allergy test to determine if you are allergic to the substance that the insect injected during the sting (venom). How is this treated? If you were stung by a bee, the stinger and a small sac of venom may be in the wound. It is important to remove the stinger as soon as possible. You can do this by brushing across the wound with gauze, a fingernail, or a flat card such as a credit card. Removing the stinger can help reduce the severity of your body's reaction to the sting. Most stings can be treated with:  Icing to reduce swelling in the area.  Medicines (antihistamines) to treat itching or an allergic reaction.  Medicines to help reduce pain. These may be medicines that you take by mouth, or medicated creams or lotions that you apply to your skin.  Pay close attention to your symptoms after you have been stung. If possible, have someone stay with you to make sure you do not have an allergic reaction. If you have any signs of an allergic reaction, call your health care provider. If you have ever had a severe allergic reaction, your health care provider may give you  an inhaler or injectable medicine (epinephrine auto-injector) to use if necessary. Follow these instructions at home:  Wash the sting site 2-3 times each day with soap and water as told by your health care provider.  Apply or take over-the-counter and prescription medicines only as told by your health care provider.  If directed, apply ice to the sting area. ? Put ice in a plastic bag. ? Place a towel between your skin and the bag. ? Leave the ice on for 20 minutes, 2-3 times a day.  Do not scratch the sting area.  If you had a severe allergic reaction to a sting, you may need: ? To wear a medical bracelet or necklace that lists the allergy. ? To learn when and how to use an anaphylaxis kit or epinephrine injection. Your family members and coworkers may also need to learn this. ? To carry an anaphylaxis kit or epinephrine injection with you at all times. How is this prevented?  Avoid swatting at stinging insects and disturbing insect nests.  Do not use fragrant soaps or lotions.  Wear shoes, pants, and long sleeves when spending time outdoors, especially in grassy areas where stinging insects are common.  Keep outdoor areas free from nests or hives.  Keep food and drink containers covered when eating outdoors.  Avoid working or sitting near [employer], if possible.  Wear gloves if you are gardening or working outdoors.  If an attack by a stinging insect or a swarm seems likely in the moment, move away from the area or find a barrier between you and the insect(s), such as a door. Contact a health care provider if:  Your symptoms do not get better in 2-3 days.  You have redness, swelling, or pain that spreads beyond the area of the sting.  You have a fever. Get help right away if: You have symptoms of a severe allergic reaction. These include:  Wheezing or difficulty breathing.  Tightness in the chest or chest pain.  Light-headedness or fainting.  Itchy, raised,  red patches on the skin.  Nausea or vomiting.  Abdominal cramping.  Diarrhea.  A swollen tongue or lips, or trouble swallowing.  Dizziness or fainting.  Summary  Stings from bees, wasps, and hornets can cause pain and inflammation, but they are usually not serious. However, some people may have an allergic reaction to a sting. This can cause the symptoms to be more severe.  Pay close attention to your symptoms after you have been stung. If possible, have someone stay with you to make sure you do not have an allergic reaction.  Call your health care provider if you have any signs of an allergic reaction. This information is not intended to replace advice given to you by your health care provider. Make sure you discuss any questions you have with your health care provider. Document Released: 02/28/2005 Document Revised: 05/05/2016 Document Reviewed: 05/05/2016 [employer]vier Interactive Patient Education  [employer].

## 2017-09-15 NOTE — Progress Notes (Signed)
Subjective:  By signing my name below, I, [name], attest that this documentation has been prepared under the direction and in the presence of Delman Cheadle, MD Electronically Signed: Ladene Artist, ED Scribe 09/15/2017 at 10:20 AM.   Patient ID: [name], female    DOB: 1965/08/31, 52 y.o.   MRN: [id number]  Chief Complaint  Patient presents with  . Insect Bite    bee sting, pt was cutting grass tuesday when she was stung   HPI [name] is a 52 y.o. female who presents to Primary Care at [facility] complaining of an insect bite sustained 3 days ago. Pt states that she was cutting grass 3 days ago when she was stung multiple times by what she thinks were bees to her LEs and 2 on her L hand. Pt has applied baking soda paste to the areas and tried 2 tabs of benadryl, which she is unable to take at night since she works third shift. She has noticed increased itching and pain when she doesn't take benadryl. Denies drainage from the areas or fever.  Past Medical History:  Diagnosis Date  . Allergy   . Eczema   . Hypertension    Past Surgical History:  Procedure Laterality Date  . ABDOMINAL HYSTERECTOMY    . CESAREAN SECTION    . KNEE ARTHROSCOPY W/ ACL RECONSTRUCTION Left   . TUBAL LIGATION     Current Outpatient Medications on File Prior to Visit  Medication Sig Dispense Refill  . telmisartan-hydrochlorothiazide (MICARDIS HCT) 80-25 MG tablet Take 1 tablet by mouth daily.    Marland Kitchen buPROPion (WELLBUTRIN XL) 300 MG 24 hr tablet Take 1 tablet (300 mg total) by mouth every morning. 30 tablet 5  . losartan-hydrochlorothiazide (HYZAAR) 100-12.5 MG per tablet Take 1/2 - 1 tab daily 30 tablet 5  . meloxicam (MOBIC) 15 MG tablet Take 1 tablet (15 mg total) by mouth daily. (Patient not taking: Reported on 09/15/2017) 30 tablet 0   No current facility-administered medications on file prior to visit.    No Known Allergies Family History  Problem Relation Age of Onset  . Cancer Mother 11         Breast Cancer  . Diabetes Father   . Kidney disease Father   . Hypertension Sister   . Heart disease Maternal Grandmother   . Cancer Maternal Aunt        Lung Cancer  . Heart disease Maternal Aunt    Social History   Socioeconomic History  . Marital status: Divorced    Spouse name: Not on file  . Number of children: Not on file  . Years of education: Not on file  . Highest education level: Not on file  Occupational History  . Not on file  Social Needs  . Financial resource strain: Not on file  . Food insecurity:    Worry: Not on file    Inability: Not on file  . Transportation needs:    Medical: Not on file    Non-medical: Not on file  Tobacco Use  . Smoking status: Current Some Day Smoker    Packs/day: 0.25    Years: 36.00    Pack years: 9.00    Types: Cigarettes  . Smokeless tobacco: Never Used  . Tobacco comment: She is trying to quit.    Substance and Sexual Activity  . Alcohol use: Yes    Alcohol/week: 0.0 oz    Comment: Occasional   . Drug use: Not on  file  . Sexual activity: Yes    Partners: Male    Birth control/protection: Abstinence, None  Lifestyle  . Physical activity:    Days per week: Not on file    Minutes per session: Not on file  . Stress: Not on file  Relationships  . Social connections:    Talks on phone: Not on file    Gets together: Not on file    Attends religious service: Not on file    Active member of club or organization: Not on file    Attends meetings of clubs or organizations: Not on file    Relationship status: Not on file  Other Topics Concern  . Not on file  Social History Narrative   Marital Status: Divorced    Children:  Sons (2)    Pets: None   Living Situation: Lives alone   Occupation: Shipping Clerk Therapist, nutritional)     Education: Programmer, systems    Tobacco Use/Exposure:  She has smoked as much 1 ppd.  She has smoked for > 20 years.  She is trying to quit and is down to about 3-5 cigs per day.      Alcohol Use: 4 days/months  (3 drinks)    Drug Use:  None   Diet:  Regular   Exercise:  Walking/ Zumba/Basketball, Biking   Hobbies: Dancing/ Cooking                Depression screen [facility] 2/9 09/15/2017 11/24/2014  Decreased Interest 0 0  Down, Depressed, Hopeless 0 0  PHQ - 2 Score 0 0   Review of Systems  Constitutional: Negative for fever.  Skin: Positive for color change.       + insect bites      Objective:   Physical Exam  Constitutional: She is oriented to person, place, and time. She appears well-developed and well-nourished. No distress.  HENT:  Head: Normocephalic and atraumatic.  Eyes: Conjunctivae and EOM are normal.  Neck: Neck supple. No tracheal deviation present.  Cardiovascular: Normal rate.  Pulmonary/Chest: Effort normal. No respiratory distress.  Musculoskeletal: Normal range of motion.  1+ bilaterally L slightly worse than L. R with 4 stings on R below knee, 1 above. 2 on L below knee, 2 on L hand. Minimal warmth. No tenderness to palpation. No fluctuance below. Minimal induration. No drainage. Non blanching. Ecchymotic appearance.  Neurological: She is alert and oriented to person, place, and time.  Skin: Skin is warm and dry.  Psychiatric: She has a normal mood and affect. Her behavior is normal.  Nursing note and vitals reviewed.  BP 114/74   Pulse 92   Temp 98.1 F (36.7 C) (Oral)   Resp 16   Ht 5\' 9"  (1.753 m)   Wt 215 lb (97.5 kg)   SpO2 99%   BMI 31.75 kg/m     Assessment & Plan:   1. Bee sting reaction, accidental or unintentional, initial encounter     Meds ordered this encounter  Medications  . cetirizine (ZYRTEC) 10 MG tablet    Sig: Take 1 tablet (10 mg total) by mouth daily. Before bed x 2 weeks then prn    Dispense:  30 tablet    Refill:  2  . predniSONE (DELTASONE) 10 MG tablet    Sig: 4-3-2-1 tabs po qd, start tonight before work    Dispense:  10 tablet    Refill:  0   I personally performed the services described in  this  documentation, which was scribed in my presence. The recorded information has been reviewed and considered, and addended by me as needed.   Delman Cheadle, M.D.  Primary Care at [facility] [address] [place], [place] [address] [phone] phone [phone] fax  12/30/17 9:40 PM

## 2019-04-03 ENCOUNTER — Ambulatory Visit: Payer: BC Managed Care – PPO | Attending: Internal Medicine

## 2019-04-03 DIAGNOSIS — Z20822 Contact with and (suspected) exposure to covid-19: Secondary | ICD-10-CM

## 2019-04-04 LAB — NOVEL CORONAVIRUS, NAA: SARS-CoV-2, NAA: NOT DETECTED

## 2019-10-22 ENCOUNTER — Other Ambulatory Visit: Payer: BC Managed Care – PPO

## 2019-10-22 ENCOUNTER — Other Ambulatory Visit: Payer: Self-pay

## 2019-10-22 DIAGNOSIS — Z20822 Contact with and (suspected) exposure to covid-19: Secondary | ICD-10-CM

## 2019-10-23 LAB — SARS-COV-2, NAA 2 DAY TAT

## 2019-10-23 LAB — NOVEL CORONAVIRUS, NAA: SARS-CoV-2, NAA: NOT DETECTED

## 2019-11-12 ENCOUNTER — Other Ambulatory Visit: Payer: Self-pay

## 2019-11-12 ENCOUNTER — Other Ambulatory Visit: Payer: BC Managed Care – PPO

## 2019-11-12 DIAGNOSIS — Z20822 Contact with and (suspected) exposure to covid-19: Secondary | ICD-10-CM

## 2019-11-13 LAB — NOVEL CORONAVIRUS, NAA: SARS-CoV-2, NAA: NOT DETECTED

## 2020-02-17 ENCOUNTER — Other Ambulatory Visit: Payer: Self-pay

## 2020-02-17 ENCOUNTER — Ambulatory Visit
Admission: RE | Admit: 2020-02-17 | Discharge: 2020-02-17 | Disposition: A | Discharge status: Home / Self Care | Payer: BC Managed Care – PPO | Source: Ambulatory Visit

## 2020-02-17 VITALS — BP 124/85 | HR 82 | Temp 98.0°F | Resp 16

## 2020-02-17 DIAGNOSIS — M546 Pain in thoracic spine: Secondary | ICD-10-CM | POA: Diagnosis not present

## 2020-02-17 MED ORDER — TIZANIDINE HCL 4 MG PO TABS: 4.0000 mg | ORAL_TABLET | Freq: Four times a day (QID) | ORAL | 0 refills | Status: DC | PRN

## 2020-02-17 MED ORDER — NAPROXEN 500 MG PO TABS: 500.0000 mg | ORAL_TABLET | Freq: Two times a day (BID) | ORAL | 0 refills | Status: DC

## 2020-02-17 NOTE — ED Provider Notes (Signed)
[facility]    CSN: [id number] Arrival date & time: 02/17/20  1343      History   Chief Complaint Chief Complaint  Patient presents with  . Appointment  . Shoulder Pain    HPI [name] is a 54 y.o. female history of hypertension presenting today for evaluation of shoulder/back pain.  Patient reports that her symptoms began last Friday and have been persistent for the past 1.5 weeks.  Has been using some Tylenol as well as ice and heat.  This has improved her range of motion in her shoulder.  Reports repetitive motions at work.  Denies specific injury trauma or fall.  Denies history of similar.  Denies any chest pain or shortness of breath.   HPI  Past Medical History:  Diagnosis Date  . Allergy   . Eczema   . Hypertension     Patient Active Problem List   Diagnosis Date Noted  . Essential hypertension, benign 02/11/2013  . Other malaise and fatigue 02/11/2013  . Tobacco use disorder 02/11/2013  . Eczema 02/11/2013  . Bacterial vaginosis 02/11/2013    Past Surgical History:  Procedure Laterality Date  . ABDOMINAL HYSTERECTOMY    . CESAREAN SECTION    . KNEE ARTHROSCOPY W/ ACL RECONSTRUCTION Left   . TUBAL LIGATION      OB History   No obstetric history on file.      Home Medications    Prior to Admission medications   Medication Sig Start Date End Date Taking? Authorizing Provider  olmesartan (BENICAR) 20 MG tablet Take 20 mg by mouth daily.   Yes [provider]  cetirizine (ZYRTEC) 10 MG tablet Take 1 tablet (10 mg total) by mouth daily. Before bed x 2 weeks then prn 09/15/17   Shawnee Knapp, MD  naproxen (NAPROSYN) 500 MG tablet Take 1 tablet (500 mg total) by mouth 2 (two) times daily. 02/17/20   Wieters, Hallie C, PA-C  tiZANidine (ZANAFLEX) 4 MG tablet Take 1 tablet (4 mg total) by mouth every 6 (six) hours as needed for muscle spasms. 02/17/20   Wieters, Hallie C, PA-C  buPROPion (WELLBUTRIN XL) 300 MG 24 hr tablet Take 1 tablet  (300 mg total) by mouth every morning. 10/04/13 02/17/20  [name], MD  losartan-hydrochlorothiazide (HYZAAR) 100-12.5 MG per tablet Take 1/2 - 1 tab daily 10/04/13 02/17/20  Zanard, Bernadene Bell, MD  telmisartan-hydrochlorothiazide (MICARDIS HCT) 80-25 MG tablet Take 1 tablet by mouth daily.  02/17/20  [provider]    Family History Family History  Problem Relation Age of Onset  . Cancer Mother 75       Breast Cancer  . Diabetes Father   . Kidney disease Father   . Hypertension Sister   . Heart disease Maternal Grandmother   . Cancer Maternal Aunt        Lung Cancer  . Heart disease Maternal Aunt     Social History Social History   Tobacco Use  . Smoking status: Current Some Day Smoker    Packs/day: 0.25    Years: 36.00    Pack years: 9.00    Types: Cigarettes  . Smokeless tobacco: Never Used  . Tobacco comment: She is trying to quit.    Substance Use Topics  . Alcohol use: Yes    Alcohol/week: 0.0 standard drinks    Comment: Occasional   . Drug use: Not on file     Allergies   Patient has no known allergies.  Review of Systems Review of Systems  Constitutional: Negative for fatigue and fever.  Eyes: Negative for visual disturbance.  Respiratory: Negative for shortness of breath.   Cardiovascular: Negative for chest pain.  Gastrointestinal: Negative for abdominal pain, nausea and vomiting.  Musculoskeletal: Positive for arthralgias, back pain and myalgias. Negative for joint swelling.  Skin: Negative for color change, rash and wound.  Neurological: Negative for dizziness, weakness, light-headedness and headaches.     Physical Exam Triage Vital Signs ED Triage Vitals  Enc Vitals Group     BP 02/17/20 1439 124/85     Pulse Rate 02/17/20 1439 82     Resp 02/17/20 1439 16     Temp 02/17/20 1439 98 F (36.7 C)     Temp Source 02/17/20 1439 Oral     SpO2 02/17/20 1439 98 %     Weight --      Height --      Head Circumference --      Peak Flow  --      Pain Score 02/17/20 1436 8     Pain Loc --      Pain Edu? --      Excl. in [place]? --    No data found.  Updated Vital Signs BP 124/85 (BP Location: Left Arm)   Pulse 82   Temp 98 F (36.7 C) (Oral)   Resp 16   SpO2 98%   Visual Acuity Right Eye Distance:   Left Eye Distance:   Bilateral Distance:    Right Eye Near:   Left Eye Near:    Bilateral Near:     Physical Exam Vitals and nursing note reviewed.  Constitutional:      Appearance: She is well-developed.     Comments: No acute distress  HENT:     Head: Normocephalic and atraumatic.     Nose: Nose normal.  Eyes:     Conjunctiva/sclera: Conjunctivae normal.  Cardiovascular:     Rate and Rhythm: Normal rate.  Pulmonary:     Effort: Pulmonary effort is normal. No respiratory distress.     Comments: Breathing comfortably at rest, CTABL, no wheezing, rales or other adventitious sounds auscultated Abdominal:     General: There is no distension.  Musculoskeletal:        General: Normal range of motion.     Cervical back: Neck supple.     Comments: Back: Nontender to palpation of thoracic and lumbar spine midline, tenderness to palpation to mid thoracic and lower periscapular musculature extending to right flank  Full active range of motion of right shoulder, nontender to palpation along superior trapezius musculature or anterior chest  Skin:    General: Skin is warm and dry.     Comments: No rash noted  Neurological:     Mental Status: She is alert and oriented to person, place, and time.      UC Treatments / Results  Labs (all labs ordered are listed, but only abnormal results are displayed) Labs Reviewed - No data to display  EKG   Radiology No results found.  Procedures Procedures (including critical care time)  Medications Ordered in UC Medications - No data to display  Initial Impression / Assessment and Plan / UC Course  I have reviewed the triage vital signs and the nursing notes.   Pertinent labs & imaging results that were available during my care of the patient were reviewed by me and considered in my medical decision making (see chart for details).  Treating for right-sided thoracic strain, recommending continued NSAIDs muscle relaxers and anti-inflammatories and monitor for continued improvement.  Gentle stretching.  Discussed strict return precautions. Patient verbalized understanding and is agreeable with plan.  Final Clinical Impressions(s) / UC Diagnoses   Final diagnoses:  Acute right-sided thoracic back pain     Discharge Instructions     Naprosyn twice daily for pain Ice along with tizanidine which is a muscle relaxer, do not drive or work after taking, may cause drowsiness Continue alternating ice and heat Gentle stretching-see attached Follow-up if not improving or worsening    ED Prescriptions    Medication Sig Dispense Auth. Provider   naproxen (NAPROSYN) 500 MG tablet Take 1 tablet (500 mg total) by mouth 2 (two) times daily. 30 tablet Wieters, Hallie C, PA-C   tiZANidine (ZANAFLEX) 4 MG tablet Take 1 tablet (4 mg total) by mouth every 6 (six) hours as needed for muscle spasms. 30 tablet Wieters, Afton C, PA-C     PDMP not reviewed this encounter.   Janith Lima, PA-C 02/18/20 1137

## 2020-02-17 NOTE — ED Triage Notes (Addendum)
Patient reports right shoulder, right rib, and right arm pain since November 26th. States that the pain had started as left than right breast pain that she had evaluated and was told was hormones, states now the pain is on the right shoulder.  Heat and ice help. Reports lots of repetitive motion with right arm.

## 2020-02-17 NOTE — Discharge Instructions (Signed)
Naprosyn twice daily for pain Ice along with tizanidine which is a muscle relaxer, do not drive or work after taking, may cause drowsiness Continue alternating ice and heat Gentle stretching-see attached Follow-up if not improving or worsening

## 2020-03-10 ENCOUNTER — Other Ambulatory Visit: Payer: Self-pay | Admitting: Obstetrics and Gynecology

## 2020-03-10 DIAGNOSIS — R928 Other abnormal and inconclusive findings on diagnostic imaging of breast: Secondary | ICD-10-CM

## 2020-03-26 ENCOUNTER — Ambulatory Visit
Admission: RE | Admit: 2020-03-26 | Discharge: 2020-03-26 | Disposition: A | Discharge status: Home / Self Care | Payer: BC Managed Care – PPO | Source: Ambulatory Visit | Attending: Obstetrics and Gynecology | Admitting: Obstetrics and Gynecology

## 2020-03-26 ENCOUNTER — Other Ambulatory Visit: Payer: Self-pay | Admitting: Obstetrics and Gynecology

## 2020-03-26 ENCOUNTER — Other Ambulatory Visit: Payer: Self-pay

## 2020-03-26 DIAGNOSIS — R928 Other abnormal and inconclusive findings on diagnostic imaging of breast: Secondary | ICD-10-CM

## 2020-03-27 ENCOUNTER — Other Ambulatory Visit: Payer: Self-pay | Admitting: Obstetrics and Gynecology

## 2020-03-27 ENCOUNTER — Ambulatory Visit
Admission: RE | Admit: 2020-03-27 | Discharge: 2020-03-27 | Disposition: A | Discharge status: Home / Self Care | Payer: BC Managed Care – PPO | Source: Ambulatory Visit | Attending: Obstetrics and Gynecology | Admitting: Obstetrics and Gynecology

## 2020-03-27 DIAGNOSIS — R928 Other abnormal and inconclusive findings on diagnostic imaging of breast: Secondary | ICD-10-CM

## 2020-03-27 DIAGNOSIS — C50919 Malignant neoplasm of unspecified site of unspecified female breast: Secondary | ICD-10-CM

## 2020-03-27 HISTORY — DX: Malignant neoplasm of unspecified site of unspecified female breast: C50.919

## 2020-03-31 ENCOUNTER — Other Ambulatory Visit: Payer: Self-pay

## 2020-03-31 ENCOUNTER — Ambulatory Visit
Admission: RE | Admit: 2020-03-31 | Discharge: 2020-03-31 | Disposition: A | Discharge status: Home / Self Care | Payer: BC Managed Care – PPO | Source: Ambulatory Visit | Attending: Obstetrics and Gynecology | Admitting: Obstetrics and Gynecology

## 2020-03-31 DIAGNOSIS — R928 Other abnormal and inconclusive findings on diagnostic imaging of breast: Secondary | ICD-10-CM

## 2020-04-01 ENCOUNTER — Telehealth: Payer: Self-pay | Admitting: Oncology

## 2020-04-01 NOTE — Telephone Encounter (Signed)
Spoke to patient to confirm morning [facility] appointment for 1/26, packet will be mailed to patient

## 2020-04-03 ENCOUNTER — Encounter: Payer: Self-pay | Admitting: *Deleted

## 2020-04-03 DIAGNOSIS — Z170 Estrogen receptor positive status [ER+]: Secondary | ICD-10-CM

## 2020-04-03 DIAGNOSIS — C50411 Malignant neoplasm of upper-outer quadrant of right female breast: Secondary | ICD-10-CM | POA: Insufficient documentation

## 2020-04-08 ENCOUNTER — Inpatient Hospital Stay (HOSPITAL_BASED_OUTPATIENT_CLINIC_OR_DEPARTMENT_OTHER): Payer: BC Managed Care – PPO | Admitting: Genetic Counselor

## 2020-04-08 ENCOUNTER — Inpatient Hospital Stay: Payer: BC Managed Care – PPO | Attending: Oncology | Admitting: Oncology

## 2020-04-08 ENCOUNTER — Encounter: Payer: Self-pay | Admitting: *Deleted

## 2020-04-08 ENCOUNTER — Ambulatory Visit
Admission: RE | Admit: 2020-04-08 | Discharge: 2020-04-08 | Disposition: A | Discharge status: Home / Self Care | Payer: BC Managed Care – PPO | Source: Ambulatory Visit | Attending: Radiation Oncology | Admitting: Radiation Oncology

## 2020-04-08 ENCOUNTER — Encounter: Payer: Self-pay | Admitting: Genetic Counselor

## 2020-04-08 ENCOUNTER — Encounter: Payer: Self-pay | Admitting: Oncology

## 2020-04-08 ENCOUNTER — Encounter: Payer: Self-pay | Admitting: Licensed Clinical Social Worker

## 2020-04-08 ENCOUNTER — Encounter: Payer: Self-pay | Admitting: Physical Therapy

## 2020-04-08 ENCOUNTER — Inpatient Hospital Stay: Payer: BC Managed Care – PPO

## 2020-04-08 ENCOUNTER — Other Ambulatory Visit: Payer: Self-pay

## 2020-04-08 ENCOUNTER — Other Ambulatory Visit: Payer: Self-pay | Admitting: *Deleted

## 2020-04-08 ENCOUNTER — Other Ambulatory Visit: Payer: Self-pay | Admitting: General Surgery

## 2020-04-08 ENCOUNTER — Ambulatory Visit: Payer: BC Managed Care – PPO | Attending: General Surgery | Admitting: Physical Therapy

## 2020-04-08 VITALS — BP 127/73 | HR 87 | Temp 97.5°F | Resp 18 | Ht 69.0 in | Wt 219.4 lb

## 2020-04-08 DIAGNOSIS — F17210 Nicotine dependence, cigarettes, uncomplicated: Secondary | ICD-10-CM

## 2020-04-08 DIAGNOSIS — Z8042 Family history of malignant neoplasm of prostate: Secondary | ICD-10-CM

## 2020-04-08 DIAGNOSIS — R293 Abnormal posture: Secondary | ICD-10-CM | POA: Diagnosis present

## 2020-04-08 DIAGNOSIS — Z803 Family history of malignant neoplasm of breast: Secondary | ICD-10-CM | POA: Insufficient documentation

## 2020-04-08 DIAGNOSIS — Z79899 Other long term (current) drug therapy: Secondary | ICD-10-CM | POA: Diagnosis not present

## 2020-04-08 DIAGNOSIS — Z833 Family history of diabetes mellitus: Secondary | ICD-10-CM | POA: Insufficient documentation

## 2020-04-08 DIAGNOSIS — Z8249 Family history of ischemic heart disease and other diseases of the circulatory system: Secondary | ICD-10-CM | POA: Diagnosis not present

## 2020-04-08 DIAGNOSIS — C50411 Malignant neoplasm of upper-outer quadrant of right female breast: Secondary | ICD-10-CM

## 2020-04-08 DIAGNOSIS — I10 Essential (primary) hypertension: Secondary | ICD-10-CM | POA: Diagnosis not present

## 2020-04-08 DIAGNOSIS — Z801 Family history of malignant neoplasm of trachea, bronchus and lung: Secondary | ICD-10-CM | POA: Diagnosis not present

## 2020-04-08 DIAGNOSIS — Z170 Estrogen receptor positive status [ER+]: Secondary | ICD-10-CM

## 2020-04-08 DIAGNOSIS — Z8261 Family history of arthritis: Secondary | ICD-10-CM | POA: Diagnosis not present

## 2020-04-08 DIAGNOSIS — Z90710 Acquired absence of both cervix and uterus: Secondary | ICD-10-CM

## 2020-04-08 DIAGNOSIS — Z171 Estrogen receptor negative status [ER-]: Secondary | ICD-10-CM | POA: Diagnosis not present

## 2020-04-08 LAB — CMP (CANCER CENTER ONLY)
ALT: 16 U/L (ref 0–44)
AST: 18 U/L (ref 15–41)
Albumin: 4.2 g/dL (ref 3.5–5.0)
Alkaline Phosphatase: 118 U/L (ref 38–126)
Anion gap: 9 (ref 5–15)
BUN: 18 mg/dL (ref 6–20)
CO2: 26 mmol/L (ref 22–32)
Calcium: 10 mg/dL (ref 8.9–10.3)
Chloride: 102 mmol/L (ref 98–111)
Creatinine: 0.88 mg/dL (ref 0.44–1.00)
GFR, Estimated: >60 mL/min (ref >60)
Glucose, Bld: 96 mg/dL (ref 70–99)
Potassium: 3.7 mmol/L (ref 3.5–5.1)
Sodium: 137 mmol/L (ref 135–145)
Total Bilirubin: 0.3 mg/dL (ref 0.3–1.2)
Total Protein: 8 g/dL (ref 6.5–8.1)

## 2020-04-08 LAB — CBC WITH DIFFERENTIAL (CANCER CENTER ONLY)
Abs Immature Granulocytes: 0.01 10*3/uL (ref 0.00–0.07)
Basophils Absolute: 0 10*3/uL (ref 0.0–0.1)
Basophils Relative: 0 %
Eosinophils Absolute: 0.2 10*3/uL (ref 0.0–0.5)
Eosinophils Relative: 3 %
HCT: 36.2 % (ref 36.0–46.0)
Hemoglobin: 11.4 g/dL — ABNORMAL LOW (ref 12.0–15.0)
Immature Granulocytes: 0 %
Lymphocytes Relative: 34 %
Lymphs Abs: 2.3 10*3/uL (ref 0.7–4.0)
MCH: 28.3 pg (ref 26.0–34.0)
MCHC: 31.5 g/dL (ref 30.0–36.0)
MCV: 89.8 fL (ref 80.0–100.0)
Monocytes Absolute: 0.7 10*3/uL (ref 0.1–1.0)
Monocytes Relative: 10 %
Neutro Abs: 3.7 10*3/uL (ref 1.7–7.7)
Neutrophils Relative %: 53 %
Platelet Count: 288 10*3/uL (ref 150–400)
RBC: 4.03 MIL/uL (ref 3.87–5.11)
RDW: 12.5 % (ref 11.5–15.5)
WBC Count: 6.9 10*3/uL (ref 4.0–10.5)
nRBC: 0 % (ref 0.0–0.2)

## 2020-04-08 LAB — GENETIC SCREENING ORDER

## 2020-04-08 NOTE — Therapy (Signed)
[place], [place], [address] Phone: [phone]   Fax:  [phone]  Physical Therapy Evaluation  Patient Details  Name: [name] MRN: [id number] Date of Birth: 22-Mar-1965 Referring Provider (PT): Dr. Rolm Bookbinder   Encounter Date: 04/08/2020   PT End of Session - 04/08/20 1517    Visit Number 1    Number of Visits 2    Date for PT Re-Evaluation 10/06/20    PT Start Time 1048    PT Stop Time 1117    PT Time Calculation (min) 29 min    Activity Tolerance Patient tolerated treatment well    Behavior During Therapy [facility] for tasks assessed/performed           Past Medical History:  Diagnosis Date  . Allergy   . Arthritis 2018  . Breast cancer ([place]) 03-27-2020  . Eczema   . Family history of breast cancer   . Family history of lung cancer   . Family history of prostate cancer   . Hypertension     Past Surgical History:  Procedure Laterality Date  . ABDOMINAL HYSTERECTOMY    . CESAREAN SECTION    . KNEE ARTHROSCOPY W/ ACL RECONSTRUCTION Left   . TUBAL LIGATION      There were no vitals filed for this visit.    Subjective Assessment - 04/08/20 1510    Subjective Patient reports she is here today to be seen by her medical team for her newly diagnosed right breast cancer.    Patient is accompained by: Family member    Pertinent History Patient was diagnosed on 02/25/2020 with right grade II invasive ductal carcinoma breast cancer. It measures 1.8 cm and is located in the upper outer quadrant. It is functionally triple negative with ER/PR being only 1% and HER2 is negative. She has multiple abnormal appearing lymph nodes in her axilla and 1 biopsied and found to be positive. Ki67 is 30%.    Patient Stated Goals Reduce lymphedema risk and learn post op shoulder ROM HEP    Currently in Pain? No/denies              [facility] PT Assessment - 04/08/20 0001      Assessment   Medical  Diagnosis Right breast cancer    Referring Provider (PT) Dr. Rolm Bookbinder    Onset Date/Surgical Date 02/25/20    Hand Dominance Right    Prior Therapy none      Precautions   Precautions Other (comment)    Precaution Comments active cancer      Restrictions   Weight Bearing Restrictions No      Balance Screen   Has the patient fallen in the past 6 months No    Has the patient had a decrease in activity level because of a fear of falling?  No    Is the patient reluctant to leave their home because of a fear of falling?  No      Home Environment   Living Environment Private residence    Living Arrangements Children   55 y.o. son   Available Help at Discharge Family      Prior Function   Level of Independence Independent    Vocation Full time employment    Pharmacist, hospital    Leisure She bikes sometimes      Cognition   Overall Cognitive Status Within Functional Limits for tasks assessed      Posture/Postural Control  Posture/Postural Control Postural limitations    Postural Limitations Rounded Shoulders;Forward head      ROM / Strength   AROM / PROM / Strength AROM;Strength      AROM   Overall AROM Comments Cervical AROM is WNL    AROM Assessment Site Shoulder    Right/Left Shoulder Right;Left    Right Shoulder Extension 65 Degrees    Right Shoulder Flexion 156 Degrees    Right Shoulder ABduction 160 Degrees    Right Shoulder Internal Rotation 60 Degrees    Right Shoulder External Rotation 90 Degrees    Left Shoulder Extension 62 Degrees    Left Shoulder Flexion 152 Degrees    Left Shoulder ABduction 172 Degrees    Left Shoulder Internal Rotation 52 Degrees    Left Shoulder External Rotation 72 Degrees      Strength   Overall Strength Within functional limits for tasks performed             LYMPHEDEMA/ONCOLOGY QUESTIONNAIRE - 04/08/20 0001      Type   Cancer Type Right breast cancer      Lymphedema Assessments   Lymphedema  Assessments Upper extremities      Right Upper Extremity Lymphedema   10 cm Proximal to Olecranon Process 35 cm    Olecranon Process 28.9 cm    10 cm Proximal to Ulnar Styloid Process 24.5 cm    Just Proximal to Ulnar Styloid Process 17.7 cm    Across Hand at [employer] 21 cm    At [place] of 2nd Digit 6.9 cm      Left Upper Extremity Lymphedema   10 cm Proximal to Olecranon Process 34.4 cm    Olecranon Process 28.6 cm    10 cm Proximal to Ulnar Styloid Process 23.7 cm    Just Proximal to Ulnar Styloid Process 17.2 cm    Across Hand at [employer] 20.3 cm    At [place] of 2nd Digit 7 cm           L-DEX FLOWSHEETS - 04/08/20 1500      L-DEX LYMPHEDEMA SCREENING   Measurement Type Unilateral    L-DEX MEASUREMENT EXTREMITY Upper Extremity    POSITION  Standing    DOMINANT SIDE Right    At Risk Side Right    BASELINE SCORE (UNILATERAL) 3.5          The patient was assessed using the L-Dex machine today to produce a lymphedema index baseline score. The patient will be reassessed on a regular basis (typically every 3 months) to obtain new L-Dex scores. If the score is > 6.5 points away from his/her baseline score indicating onset of subclinical lymphedema, it will be recommended to wear a compression garment for 4 weeks, 12 hours per day and then be reassessed. If the score continues to be > 6.5 points from baseline at reassessment, we will initiate lymphedema treatment. Assessing in this manner has a 95% rate of preventing clinically significant lymphedema.       Katina Dung - 04/08/20 0001    Open a tight or new jar No difficulty    Do heavy household chores (wash walls, wash floors) No difficulty    Carry a shopping bag or briefcase No difficulty    Wash your back No difficulty    Use a knife to cut food No difficulty    Recreational activities in which you take some force or impact through your arm, shoulder, or hand (golf, hammering, tennis)  No difficulty    During the  past week, to what extent has your arm, shoulder or hand problem interfered with your normal social activities with family, friends, neighbors, or groups? Not at all    During the past week, to what extent has your arm, shoulder or hand problem limited your work or other regular daily activities Not at all    Arm, shoulder, or hand pain. None    Tingling (pins and needles) in your arm, shoulder, or hand None    Difficulty Sleeping No difficulty    DASH Score 0 %            Objective measurements completed on examination: See above findings.        Patient was instructed today in a home exercise program today for post op shoulder range of motion. These included active assist shoulder flexion in sitting, scapular retraction, wall walking with shoulder abduction, and hands behind head external rotation.  She was encouraged to do these twice a day, holding 3 seconds and repeating 5 times when permitted by her physician.           PT Education - 04/08/20 1516    Education Details Lymphedema risk reduction and post op shoulder ROM HEP    Person(s) Educated Patient;Child(ren)    Methods Explanation;Demonstration;Handout    Comprehension Returned demonstration;Verbalized understanding               PT Long Term Goals - 04/08/20 1521      PT LONG TERM GOAL #1   Title Patient will demonstrate she has regained full shoulder ROM and function post operatively compared to baselines.    Time 6    Period Months    Status New    Target Date 10/06/20           Breast Clinic Goals - 04/08/20 1521      Patient will be able to verbalize understanding of pertinent lymphedema risk reduction practices relevant to her diagnosis specifically related to skin care.   Time 1    Period Days    Status Achieved      Patient will be able to return demonstrate and/or verbalize understanding of the post-op home exercise program related to regaining shoulder range of motion.   Time 1     Period Days    Status Achieved      Patient will be able to verbalize understanding of the importance of attending the postoperative After Breast Cancer Class for further lymphedema risk reduction education and therapeutic exercise.   Time 1    Period Days    Status Achieved                 Plan - 04/08/20 1518    Clinical Impression Statement Patient was diagnosed on 02/25/2020 with right grade II invasive ductal carcinoma breast cancer. It measures 1.8 cm and is located in the upper outer quadrant. It is functionally triple negative with ER/PR being only 1% and HER2 is negative. She has multiple abnormal appearing lymph nodes in her axilla and 1 biopsied and found to be positive. Ki67 is 30%. Her multidisciplinary medical team met prior to her assessments to determine a recommended treatment plan. She is planning to have neoadjuvant chemotherapy followed by possible a right lumpectomy and targeted axillary lymph node dissection, radiation, and anti-estrogen therapy. She will benefit from a post op PT reassessment to determine needs and from L-Dex screens every 3 months for 2 years to detect subclinical  lymphedema.    Stability/Clinical Decision Making Stable/Uncomplicated    Clinical Decision Making Low    Rehab Potential Excellent    PT Frequency --   Eval and 1 f/u visit   PT Treatment/Interventions ADLs/Self Care Home Management;Therapeutic exercise;Patient/family education    PT Next Visit Plan Will reassess 3-4 weeks post op    PT Home Exercise Plan Post op shoulder HEP    Consulted and Agree with Plan of Care Patient;Family member/caregiver    Family Member Consulted son           Patient will benefit from skilled therapeutic intervention in order to improve the following deficits and impairments:  Postural dysfunction,Decreased range of motion,Decreased knowledge of precautions,Impaired UE functional use,Pain  Visit Diagnosis: Malignant neoplasm of upper-outer quadrant of  right breast in female, estrogen receptor positive ([place]) - Plan: PT plan of care cert/re-cert  Abnormal posture - Plan: PT plan of care cert/re-cert   Patient will follow up at outpatient cancer rehab 3-4 weeks following surgery.  If the patient requires physical therapy at that time, a specific plan will be dictated and sent to the referring physician for approval. The patient was educated today on appropriate basic range of motion exercises to begin post operatively and the importance of attending the After Breast Cancer class following surgery.  Patient was educated today on lymphedema risk reduction practices as it pertains to recommendations that will benefit the patient immediately following surgery.  She verbalized good understanding.     Problem List Patient Active Problem List   Diagnosis Date Noted  . Family history of breast cancer   . Family history of lung cancer   . Family history of prostate cancer   . Malignant neoplasm of upper-outer quadrant of right breast in female, estrogen receptor positive ([place]) 04/03/2020  . Essential hypertension, benign 02/11/2013  . Other malaise and fatigue 02/11/2013  . Tobacco use disorder 02/11/2013  . Eczema 02/11/2013  . Bacterial vaginosis 02/11/2013   Annia Friendly, PT 04/08/20 3:37 PM  [place], [place], [address] Phone: [phone]   Fax:  [phone]  Name: [name] MRN: [id number] Date of Birth: August 16, 1965

## 2020-04-08 NOTE — Progress Notes (Signed)
[name]  [phone] [phone]     ID: [name] DOB: [birth year]  MR#: [id number]  QVZ#:[id number]  Patient Care Team: Kara Resides, MD as PCP - General (Family Medicine) Kara Kaufmann, RN as Oncology Nurse Navigator Kara Germany, RN as Oncology Nurse Navigator Kara Bookbinder, MD as Consulting Physician (General Surgery) [name], Virgie Dad, MD as Consulting Physician (Oncology) Kara Rudd, MD as Consulting Physician (Radiation Oncology) Kara Fowler, MD as Consulting Physician (Obstetrics and Gynecology) Kara Saas, MD as Consulting Physician (Orthopedic Surgery) Kara Craver, MD as Consulting Physician (Gastroenterology) Kara Cruel, MD OTHER MD:  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 She underwent right diagnostic mammography with tomography and right breast ultrasonography at [facility] on 03/26/2020 showing: breast density category B; palpable 1.8 cm right breast mass at 10 o'clock, which feels much larger than what is visualized sonographically; suspicious 0.6 cm intramammary lymph node in right breast at 10 o'clock; at least 3 morphologically abnormal lymph nodes in right axilla.  Accordingly on 03/27/2020 she proceeded to biopsy of the right breast areas in question. The pathology from this procedure ([id number]) showed:  1. Right Breast, 10 o'clock (mass)  - invasive mammary carcinoma, e-cadherin positive with a lobular growth pattern, grade 2  - mammary carcinoma in situ  - Prognostic indicators significant for: estrogen receptor, 1% positive and progesterone receptor, 1% positive, both with strong staining intensity. Proliferation marker Ki67 at 30%. HER2 negative by  immunohistochemistry (0). 2. Right Breast, 10 o'clock (intramammary node)  - mammary carcinoma involving lymph node and perinodal soft tissue  -  Prognostic indicators significant for: estrogen receptor, 1% positive with strong staining intensity and progesterone receptor, 0% negative. Proliferation marker Ki67 at 40%. HER2 negative by immunohistochemistry (0).  Cancer Staging Malignant neoplasm of upper-outer quadrant of right breast in female, estrogen receptor positive ([place]) Staging form: Breast, AJCC 8th Edition - Clinical stage from 04/08/2020: Stage IIB (cT1c, cN1(f), cM0, G2, ER-, PR-, HER2-) - Signed by Kara Cruel, MD on 04/08/2020  The patient's subsequent history is as detailed below.   INTERVAL HISTORY: [name] was evaluated in the multidisciplinary breast cancer clinic on 04/08/2020 accompanied by her son [name]. Her case was also presented at the multidisciplinary breast cancer conference on the same day. At that time a preliminary plan was proposed: Neoadjuvant chemotherapy, staging studies, definitive surgery, adjuvant radiation, genetics   REVIEW OF SYSTEMS: There were no specific symptoms leading to the original mammogram, which was routinely scheduled. On the provided questionnaire, [name] occasional throbbing pain in her right breast and under-arm, wearing glasses, and hot flashes. The patient denies unusual headaches, visual changes, nausea, vomiting, stiff neck, dizziness, or gait imbalance. There has been no cough, phlegm production, or pleurisy, no chest pain or pressure, and no change in bowel or bladder habits. The patient denies fever, rash, bleeding, unexplained fatigue or unexplained weight loss. A detailed review of systems was otherwise entirely negative.   COVID 19 VACCINATION STATUS: fully vaccinated Therapist, music), with booster   PAST MEDICAL HISTORY: Past Medical History:  Diagnosis Date  . Allergy   . Arthritis 2018  . Breast cancer ([place])  03-27-2020  . Eczema   . Family history of breast cancer   . Family history of lung cancer   .  Family history of prostate cancer   . Hypertension     PAST SURGICAL HISTORY: Past Surgical History:  Procedure Laterality Date  . ABDOMINAL HYSTERECTOMY    . CESAREAN SECTION    . KNEE ARTHROSCOPY W/ ACL RECONSTRUCTION Left   . TUBAL LIGATION      FAMILY HISTORY: Family History  Problem Relation Age of Onset  . Breast cancer Mother 19  . Arthritis Mother   . Diabetes Father   . Kidney disease Father   . Hypertension Sister   . Heart disease Maternal Grandmother   . Breast cancer Paternal Grandmother        dx >50  . Heart disease Maternal Aunt   . Breast cancer Maternal Aunt 77  . Other Maternal Aunt        brain tumor (not cancerous)  . Lung cancer Maternal Aunt   . Prostate cancer Cousin 39       localized   Her parents are both living, her father age 45 and mother age 21, as of 03/2020. [name] has 1 brother and 2 sisters. She Franco breast cancer in her mother at age 105, her maternal aunt, and her paternal grandmother. Her maternal aunt also has a history of lung cancer. She also Franco prostate cancer in a maternal cousin in his 60's.   GYNECOLOGIC HISTORY:  No LMP recorded. Patient has had a hysterectomy. Menarche: 54 years old Age at first live birth: 55 years old [place] P 2 LMP 2010 with hysterectomy Contraceptive: prior use without issue HRT never used  Hysterectomy? Yes, 2010 for fibroids BSO? 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 She is divorced. She lives at home with son [name], age 46, who works for [employer]. Son [name], [name], age 60, works as a Development worker, international aid in [place], [place]. [name] has no grandchildren. She attends [employer].    ADVANCED DIRECTIVES: not in place; considering naming mother [name], son [name], or son [name] as healthcare powers  of attorney   HEALTH MAINTENANCE: Social History   Tobacco Use  . Smoking status: Current Some Day Smoker    Packs/day: 0.25    Years: 36.00    Pack years: 9.00    Types: Cigarettes  . Smokeless tobacco: Never Used  . Tobacco comment: She is trying to quit.    Substance Use Topics  . Alcohol use: Yes    Alcohol/week: 3.0 standard drinks    Types: 3 Glasses of wine per week    Comment: Occasional   . Drug use: Never     Colonoscopy: age 44  PAP: date unsure  Bone density: never done   No Known Allergies  Current Outpatient Medications  Medication Sig Dispense Refill  . BORIC ACID VAGINAL VA Place vaginally as needed.    . Cholecalciferol (VITAMIN D) 125 MCG (5000 UT) CAPS Take 5,000 Units by mouth daily.    . Cyanocobalamin (VITAMIN B 12 PO) Take 5,000 mcg by mouth daily.    Marland Kitchen olmesartan-hydrochlorothiazide (BENICAR HCT) 40-25 MG tablet Take 1 tablet by mouth every morning.     No current facility-administered medications for this visit.    OBJECTIVE: African-American woman who appears stated age  55:   04/08/20 0852  BP: 127/73  Pulse: 87  Resp: 18  Temp: (!) 97.5 F (36.4 C)  SpO2: 100%     Body mass index is 32.4 kg/m.   Wt Readings from  Last 3 Encounters:  04/08/20 219 lb 6.4 oz (99.5 kg)  09/15/17 215 lb (97.5 kg)  11/24/14 172 lb (78 kg)      ECOG FS:1 - Symptomatic but completely ambulatory  Ocular: Sclerae unicteric, pupils round and equal Ear-nose-throat: Wearing a mask Lymphatic: No cervical or supraclavicular adenopathy Lungs no rales or rhonchi Heart regular rate and rhythm Abd soft, nontender, positive bowel sounds MSK no focal spinal tenderness, no joint edema Neuro: non-focal, well-oriented, appropriate affect Breasts: The mass is palpable in the right upper quadrant, measuring about 2 cm by palpation, movable, with no skin or nipple involvement.    LAB RESULTS:  CMP     Component Value Date/Time   NA 137 04/08/2020 0814   K  3.7 04/08/2020 0814   CL 102 04/08/2020 0814   CO2 26 04/08/2020 0814   GLUCOSE 96 04/08/2020 0814   BUN 18 04/08/2020 0814   CREATININE 0.88 04/08/2020 0814   CREATININE 0.69 09/24/2013 0819   CALCIUM 10.0 04/08/2020 0814   PROT 8.0 04/08/2020 0814   ALBUMIN 4.2 04/08/2020 0814   AST 18 04/08/2020 0814   ALT 16 04/08/2020 0814   ALKPHOS 118 04/08/2020 0814   BILITOT 0.3 04/08/2020 0814   GFRNONAA >60 04/08/2020 0814   GFRNONAA >89 09/24/2013 0819   GFRAA >89 09/24/2013 0819    No results found for: TOTALPROTELP, ALBUMINELP, A1GS, A2GS, BETS, BETA2SER, GAMS, MSPIKE, SPEI  Lab Results  Component Value Date   WBC 6.9 04/08/2020   NEUTROABS 3.7 04/08/2020   HGB 11.4 (L) 04/08/2020   HCT 36.2 04/08/2020   MCV 89.8 04/08/2020   PLT 288 04/08/2020    No results found for: LABCA2  No components found for: CLEXNT700  No results for input(s): INR in the last 168 hours.  No results found for: LABCA2  No results found for: FVC944  No results found for: HQP591  No results found for: MBW466  No results found for: CA2729  No components found for: HGQUANT  No results found for: CEA1 / No results found for: CEA1   No results found for: AFPTUMOR  No results found for: CHROMOGRNA  No results found for: KPAFRELGTCHN, LAMBDASER, KAPLAMBRATIO (kappa/lambda light chains)  No results found for: HGBA, HGBA2QUANT, HGBFQUANT, HGBSQUAN (Hemoglobinopathy evaluation)   No results found for: LDH  No results found for: IRON, TIBC, IRONPCTSAT (Iron and TIBC)  No results found for: FERRITIN  Urinalysis No results found for: COLORURINE, APPEARANCEUR, LABSPEC, PHURINE, GLUCOSEU, HGBUR, BILIRUBINUR, KETONESUR, PROTEINUR, UROBILINOGEN, NITRITE, LEUKOCYTESUR   STUDIES: US BREAST LTD UNI RIGHT INC AXILLA  Result Date: 03/26/2020 CLINICAL DATA:  Screening recall for possible right breast distortion and prominent intramammary lymph node. EXAM: DIGITAL DIAGNOSTIC UNILATERAL RIGHT  MAMMOGRAM WITH TOMO AND CAD; ULTRASOUND RIGHT BREAST LIMITED COMPARISON:  Previous exams. ACR Breast Density Category b: There are scattered areas of fibroglandular density. FINDINGS: There is persistent area of distortion in the upper-outer right breast with associated mass. Just posterosuperior to the mass is an intramammary lymph node measuring 0.6 cm, increased in size when compared to the prior exams. Mammographic images were processed with CAD. Physical examination of the upper-outer right breast really feels a large firm palpable mass. Targeted ultrasound of the right breast was performed. There is an ill-defined hypoechoic mass in the right breast at 10 o'clock 5 cm from nipple measuring 1.7 x 1.7 x 1.8 cm. This corresponds well with the mass/distortion seen in the upper-outer right breast at mammography, however feels much larger on  physical exam than what is being visualized sonographically. There is an intramammary lymph node in the right breast at 10 o'clock 7 cm from nipple measuring 0.6 x 0.5 x 0.5 cm. This corresponds with the abnormal lymph nodes seen in the upper-outer right breast at mammography. At least 3 morphologically abnormal lymph nodes are visualized in the right axilla. IMPRESSION: 1. Suspicious palpable mass in the right breast at 10 o'clock measuring up to 1.8 cm. Note that this mass feels much larger on physical exam than what is visualized sonographically. 2. Suspicious 0.6 cm intramammary lymph node in the right breast at the 10 o'clock position. 3. At least 3 morphologically abnormal lymph nodes in the right axilla. RECOMMENDATION: 1. Recommend ultrasound-guided biopsy of the mass in the right breast at the 10 o'clock position. 2. Recommend ultrasound-guided biopsy of the intramammary lymph node in the right breast at the 10 o'clock position. 3. Recommend ultrasound-guided biopsy of 1 of the lymph nodes in the right axilla. I have discussed the findings and recommendations with the  patient. If applicable, a reminder letter will be sent to the patient regarding the next appointment. BI-RADS CATEGORY  5: Highly suggestive of malignancy. Electronically Signed   By: Everlean Alstrom M.D.   On: 03/26/2020 09:30   MM DIAG BREAST TOMO UNI RIGHT  Result Date: 03/26/2020 CLINICAL DATA:  Screening recall for possible right breast distortion and prominent intramammary lymph node. EXAM: DIGITAL DIAGNOSTIC UNILATERAL RIGHT MAMMOGRAM WITH TOMO AND CAD; ULTRASOUND RIGHT BREAST LIMITED COMPARISON:  Previous exams. ACR Breast Density Category b: There are scattered areas of fibroglandular density. FINDINGS: There is persistent area of distortion in the upper-outer right breast with associated mass. Just posterosuperior to the mass is an intramammary lymph node measuring 0.6 cm, increased in size when compared to the prior exams. Mammographic images were processed with CAD. Physical examination of the upper-outer right breast really feels a large firm palpable mass. Targeted ultrasound of the right breast was performed. There is an ill-defined hypoechoic mass in the right breast at 10 o'clock 5 cm from nipple measuring 1.7 x 1.7 x 1.8 cm. This corresponds well with the mass/distortion seen in the upper-outer right breast at mammography, however feels much larger on physical exam than what is being visualized sonographically. There is an intramammary lymph node in the right breast at 10 o'clock 7 cm from nipple measuring 0.6 x 0.5 x 0.5 cm. This corresponds with the abnormal lymph nodes seen in the upper-outer right breast at mammography. At least 3 morphologically abnormal lymph nodes are visualized in the right axilla. IMPRESSION: 1. Suspicious palpable mass in the right breast at 10 o'clock measuring up to 1.8 cm. Note that this mass feels much larger on physical exam than what is visualized sonographically. 2. Suspicious 0.6 cm intramammary lymph node in the right breast at the 10 o'clock position. 3. At  least 3 morphologically abnormal lymph nodes in the right axilla. RECOMMENDATION: 1. Recommend ultrasound-guided biopsy of the mass in the right breast at the 10 o'clock position. 2. Recommend ultrasound-guided biopsy of the intramammary lymph node in the right breast at the 10 o'clock position. 3. Recommend ultrasound-guided biopsy of 1 of the lymph nodes in the right axilla. I have discussed the findings and recommendations with the patient. If applicable, a reminder letter will be sent to the patient regarding the next appointment. BI-RADS CATEGORY  5: Highly suggestive of malignancy. Electronically Signed   By: Everlean Alstrom M.D.   On: 03/26/2020 09:30  [place] AXILLARY NODE CORE BIOPSY RIGHT  Addendum Date: 04/02/2020   ADDENDUM REPORT: 04/01/2020 12:43 ADDENDUM: Pathology revealed METASTATIC MAMMARY CARCINOMA INVOLVING A LYMPH NODE of the RIGHT axilla. This was found to be concordant by Dr. Audie Pinto. Pathology results were discussed with the patient by telephone. The patient reported doing well after the biopsy with tenderness at the site. Post biopsy instructions and care were reviewed and questions were answered. The patient was encouraged to call [facility] for any additional concerns. The patient has a recent diagnosis of RIGHT breast cancer and should follow her outlined treatment plan. Pathology results reported by Stacie Acres RN on 04/01/2020. Electronically Signed   By: Audie Pinto M.D.   On: 04/01/2020 12:43   Addendum Date: 04/01/2020   ADDENDUM REPORT: 04/01/2020 12:43 ADDENDUM: Pathology revealed METASTATIC MAMMARY CARCINOMA INVOLVING A LYMPH NODE of the RIGHT axilla. This was found to be concordant by Dr. Audie Pinto. Pathology results were discussed with the patient by telephone. The patient reported doing well after the biopsy with tenderness at the site. Post biopsy instructions and care were reviewed and questions were answered. The patient  was encouraged to call [facility] for any additional concerns. The patient has a recent diagnosis of RIGHT breast cancer and should follow her outlined treatment plan. Pathology results reported by Stacie Acres RN on 04/01/2020. Electronically Signed   By: Audie Pinto M.D.   On: 04/01/2020 12:43   Result Date: 04/01/2020 CLINICAL DATA:  55 year old female presenting for biopsy of a right axillary lymph node. EXAM: [place] AXILLARY NODE CORE BIOPSY RIGHT COMPARISON:  Previous exam(s). PROCEDURE: I met with the patient and we discussed the procedure of ultrasound-guided biopsy, including benefits and alternatives. We discussed the high likelihood of a successful procedure. We discussed the risks of the procedure, including infection, bleeding, tissue injury, clip migration, and inadequate sampling. Informed written consent was given. The usual time-out protocol was performed immediately prior to the procedure. Using sterile technique and 1% Lidocaine as local anesthetic, under direct ultrasound visualization, a 14 gauge spring-loaded device was used to perform biopsy of a right axillary lymph node using a lateral approach. At the conclusion of the procedure a heart tissue marker clip was deployed into the biopsy cavity. Follow up 2 view mammogram was performed and dictated separately. IMPRESSION: Ultrasound guided biopsy of a right axillary lymph node. No apparent complications. Electronically Signed: By: Audie Pinto M.D. On: 03/31/2020 15:15   MM CLIP PLACEMENT RIGHT  Result Date: 03/31/2020 CLINICAL DATA:  Post procedure mammogram for clip placement. Patient had two right breast biopsies performed on January 13 but a post clip mammogram was not performed at that time due to patient bleeding. EXAM: DIAGNOSTIC RIGHT MAMMOGRAM POST ULTRASOUND BIOPSY COMPARISON:  Previous exam(s). FINDINGS: Mammographic images were obtained following ultrasound guided biopsy of a right breast mass  at 10 o'clock 5 cm from the nipple. The ribbon biopsy marking clip is in expected position at the site of biopsy. Mammographic images were obtained following ultrasound guided biopsy of a right breast mass at 10 o'clock 7 cm from the nipple. The coil biopsy marking clip is in expected position at the site of biopsy. Mammographic images were obtained following ultrasound guided biopsy of a right axillary lymph node. The heart biopsy marking clip is in expected position at the site of biopsy. IMPRESSION: Appropriate positioning of the ribbon shaped biopsy marking clip at the site of biopsy in the right  breast at 10 o'clock 5 cm from nipple. Appropriate positioning of the coil shaped biopsy marking clip at the site of biopsy in the right breast at 10 o'clock 7 cm from nipple. Appropriate positioning of the heart shaped biopsy marking clip at the site of biopsy in the right axilla. Final Assessment: Post Procedure Mammograms for Marker Placement Electronically Signed   By: Audie Pinto M.D.   On: 03/31/2020 15:15   [place] RT BREAST BX W LOC DEV 1ST LESION IMG BX SPEC US GUIDE  Addendum Date: 04/02/2020   ADDENDUM REPORT: 04/01/2020 12:14 ADDENDUM: Pathology revealed GRADE II INVASIVE MAMMARY CARCINOMA. MAMMARY CARCINOMA IN SITU of the RIGHT breast, 10 o'clock, 5 cmfn. E-cadherin is positive supporting a ductal origin; however, please note that there is a lobular growth pattern. This was found to be concordant by Dr. Kristopher Oppenheim. Pathology revealed MAMMARY CARCINOMA INVOLVING LYMPH NODE AND PERINODAL SOFT TISSUE of the RIGHT breast, 10 o'clock, 7 cmfn. This was found to be concordant by Dr. Kristopher Oppenheim. Pathology results were discussed with the patient by telephone. The patient reported doing well after the biopsies with tenderness and minimal bleeding at the sites. Post biopsy instructions and care were reviewed and questions were answered. The patient was encouraged to call [facility] for any additional concerns. Due to bleeding after second biopsy, patient instructed to return for clip films and RIGHT axillary lymph node biopsy on 03/31/2020 at 2:45 PM. The patient was referred to [facility] on April 08, 2020. Pathology results reported by Stacie Acres RN on 04/01/2020. Electronically Signed   By: Kristopher Oppenheim M.D.   On: 04/01/2020 12:14   Addendum Date: 04/01/2020   ADDENDUM REPORT: 04/01/2020 12:14 ADDENDUM: Pathology revealed GRADE II INVASIVE MAMMARY CARCINOMA. MAMMARY CARCINOMA IN SITU of the RIGHT breast, 10 o'clock, 5 cmfn. E-cadherin is positive supporting a ductal origin; however, please note that there is a lobular growth pattern. This was found to be concordant by Dr. Kristopher Oppenheim. Pathology revealed MAMMARY CARCINOMA INVOLVING LYMPH NODE AND PERINODAL SOFT TISSUE of the RIGHT breast, 10 o'clock, 7 cmfn. This was found to be concordant by Dr. Kristopher Oppenheim. Pathology results were discussed with the patient by telephone. The patient reported doing well after the biopsies with tenderness and minimal bleeding at the sites. Post biopsy instructions and care were reviewed and questions were answered. The patient was encouraged to call [facility] for any additional concerns. Due to bleeding after second biopsy, patient instructed to return for clip films and RIGHT axillary lymph node biopsy on 03/31/2020 at 2:45 PM. The patient was referred to [facility] on April 08, 2020. Pathology results reported by Stacie Acres RN on 04/01/2020. Electronically Signed   By: Kristopher Oppenheim M.D.   On: 04/01/2020 12:14   Result Date: 04/01/2020 CLINICAL DATA:  55 year old female with suspicious right breast masses and right axillary lymph node. EXAM: ULTRASOUND GUIDED RIGHT BREAST CORE NEEDLE BIOPSY COMPARISON:   Previous exam(s). PROCEDURE: I met with the patient and we discussed the procedure of ultrasound-guided biopsy, including benefits and alternatives. We discussed the high likelihood of a successful procedure. We discussed the risks of the procedure, including infection, bleeding, tissue injury, clip migration, and inadequate sampling. Informed written consent was given. The usual time-out protocol was performed immediately prior to the procedure. Lesion quadrant: Upper outer  quadrant Using sterile technique and 1% Lidocaine as local anesthetic, under direct ultrasound visualization, a 12 gauge spring-loaded device was used to perform biopsy of an irregular mass at the 10 o'clock position 5 cm from the nipple using a lateral approach. At the conclusion of the procedure a ribbon shaped tissue marker clip was deployed into the biopsy cavity. Lesion quadrant: Upper outer quadrant Using sterile technique and 1% Lidocaine as local anesthetic, under direct ultrasound visualization, a 12 gauge spring-loaded device was used to perform biopsy of of a mass at the 10 o'clock position 7 cm from the nipple using a lateral approach. The patient began having significant bleeding after a single core sample was taken. Firm pressure was held for approximately 15 minutes and the bleeding subsided. Subsequently, a coil shaped post biopsy clip was placed. At this time the patient began rebleeding significantly. Additional pressure was held for 10 minutes and a "quick clot" applied. At this point the bleeding subsided. However, follow-up mammogram and additional biopsy of a right axillary lymph node was deferred at this time. An appointment will be arranged for the patient to return for this. IMPRESSION: 1. Ultrasound guided biopsy of the right breast x2. This was complicated by significant bleeding after a single pass was obtained through the mass at the 10 o'clock position 7 cm from the nipple. Firm pressure was applied for multiple  intervals and a quick clot and pressure dressing were placed. The bleeding eventually subsided and the patient was discharged in stable condition. She was given instructions if the bleeding returns. 2. Additional biopsy of the right axilla and post biopsy clip films were deferred at this time. An appointment will be made for the patient to return soon to complete this. Electronically Signed: By: Kristopher Oppenheim M.D. On: 03/27/2020 12:32   [place] RT BREAST BX W LOC DEV EA ADD LESION IMG BX SPEC US GUIDE  Addendum Date: 04/02/2020   ADDENDUM REPORT: 04/01/2020 12:14 ADDENDUM: Pathology revealed GRADE II INVASIVE MAMMARY CARCINOMA. MAMMARY CARCINOMA IN SITU of the RIGHT breast, 10 o'clock, 5 cmfn. E-cadherin is positive supporting a ductal origin; however, please note that there is a lobular growth pattern. This was found to be concordant by Dr. Kristopher Oppenheim. Pathology revealed MAMMARY CARCINOMA INVOLVING LYMPH NODE AND PERINODAL SOFT TISSUE of the RIGHT breast, 10 o'clock, 7 cmfn. This was found to be concordant by Dr. Kristopher Oppenheim. Pathology results were discussed with the patient by telephone. The patient reported doing well after the biopsies with tenderness and minimal bleeding at the sites. Post biopsy instructions and care were reviewed and questions were answered. The patient was encouraged to call [facility] for any additional concerns. Due to bleeding after second biopsy, patient instructed to return for clip films and RIGHT axillary lymph node biopsy on 03/31/2020 at 2:45 PM. The patient was referred to [facility] at [facility] on April 08, 2020. Pathology results reported by Stacie Acres RN on 04/01/2020. Electronically Signed   By: Kristopher Oppenheim M.D.   On: 04/01/2020 12:14   Addendum Date: 04/01/2020   ADDENDUM REPORT: 04/01/2020 12:14 ADDENDUM: Pathology revealed GRADE II INVASIVE MAMMARY CARCINOMA. MAMMARY  CARCINOMA IN SITU of the RIGHT breast, 10 o'clock, 5 cmfn. E-cadherin is positive supporting a ductal origin; however, please note that there is a lobular growth pattern. This was found to be concordant by Dr. Kristopher Oppenheim. Pathology revealed MAMMARY CARCINOMA INVOLVING LYMPH NODE AND PERINODAL SOFT  TISSUE of the RIGHT breast, 10 o'clock, 7 cmfn. This was found to be concordant by Dr. Kristopher Oppenheim. Pathology results were discussed with the patient by telephone. The patient reported doing well after the biopsies with tenderness and minimal bleeding at the sites. Post biopsy instructions and care were reviewed and questions were answered. The patient was encouraged to call [facility] for any additional concerns. Due to bleeding after second biopsy, patient instructed to return for clip films and RIGHT axillary lymph node biopsy on 03/31/2020 at 2:45 PM. The patient was referred to [facility] at [facility] on April 08, 2020. Pathology results reported by Stacie Acres RN on 04/01/2020. Electronically Signed   By: Kristopher Oppenheim M.D.   On: 04/01/2020 12:14   Result Date: 04/01/2020 CLINICAL DATA:  55 year old female with suspicious right breast masses and right axillary lymph node. EXAM: ULTRASOUND GUIDED RIGHT BREAST CORE NEEDLE BIOPSY COMPARISON:  Previous exam(s). PROCEDURE: I met with the patient and we discussed the procedure of ultrasound-guided biopsy, including benefits and alternatives. We discussed the high likelihood of a successful procedure. We discussed the risks of the procedure, including infection, bleeding, tissue injury, clip migration, and inadequate sampling. Informed written consent was given. The usual time-out protocol was performed immediately prior to the procedure. Lesion quadrant: Upper outer quadrant Using sterile technique and 1% Lidocaine as local anesthetic, under direct ultrasound visualization,  a 12 gauge spring-loaded device was used to perform biopsy of an irregular mass at the 10 o'clock position 5 cm from the nipple using a lateral approach. At the conclusion of the procedure a ribbon shaped tissue marker clip was deployed into the biopsy cavity. Lesion quadrant: Upper outer quadrant Using sterile technique and 1% Lidocaine as local anesthetic, under direct ultrasound visualization, a 12 gauge spring-loaded device was used to perform biopsy of of a mass at the 10 o'clock position 7 cm from the nipple using a lateral approach. The patient began having significant bleeding after a single core sample was taken. Firm pressure was held for approximately 15 minutes and the bleeding subsided. Subsequently, a coil shaped post biopsy clip was placed. At this time the patient began rebleeding significantly. Additional pressure was held for 10 minutes and a "quick clot" applied. At this point the bleeding subsided. However, follow-up mammogram and additional biopsy of a right axillary lymph node was deferred at this time. An appointment will be arranged for the patient to return for this. IMPRESSION: 1. Ultrasound guided biopsy of the right breast x2. This was complicated by significant bleeding after a single pass was obtained through the mass at the 10 o'clock position 7 cm from the nipple. Firm pressure was applied for multiple intervals and a quick clot and pressure dressing were placed. The bleeding eventually subsided and the patient was discharged in stable condition. She was given instructions if the bleeding returns. 2. Additional biopsy of the right axilla and post biopsy clip films were deferred at this time. An appointment will be made for the patient to return soon to complete this. Electronically Signed: By: Kristopher Oppenheim M.D. On: 03/27/2020 12:32     ELIGIBLE FOR AVAILABLE RESEARCH PROTOCOL: no  ASSESSMENT: 55 y.o. [place] woman status post right breast upper outer quadrant biopsy  03/27/2020 for a clinical T1c N2, stage IIB invasive ductal carcinoma, functionally triple negative, with an MIB-1 of 30-40%  (1) genetics testing  (2) neoadjuvant chemotherapy will consist of pembrolizumab every  3 weeks to be continued for 1 year, carboplatin every 3 weeks x 4 with paclitaxel weekly x12, followed by doxorubicin and cyclophosphamide every 3 weeks x 4  (3) definitive surgery to follow  (4) adjuvant radiation  PLAN: I met today with [name] to review her new diagnosis. Specifically we discussed the biology of her breast cancer, its diagnosis, staging, treatment  options and prognosis. We first reviewed the fact that cancer is not one disease but more than 100 different diseases and that it is important to keep them separate-- otherwise when friends and relatives discuss their own cancer experiences with [name] confusion can result. Similarly we explained that if breast cancer spreads to the bone or liver, the patient would not have bone cancer or liver cancer, but breast cancer in the bone and breast cancer in the liver: one cancer in three places-- not 3 different cancers which otherwise would have to be treated in 3 different ways.  We discussed the difference between local and systemic therapy. In terms of loco-regional treatment, lumpectomy plus radiation is equivalent to mastectomy as far as survival is concerned. For this reason, and because the cosmetic results are generally superior, we recommend breast conserving surgery.   We also noted that in terms of sequencing of treatments, whether systemic therapy or surgery is done first does not affect the ultimate outcome.  This is relevant to [name] situation since we believe she will benefit from having chemotherapy before surgery.  This will make the surgery easier, make it more likely that she can safely keep her breast, and also provide [place] with prognostic information based on response.  Next we went over the options for systemic  therapy which are anti-estrogens,  immunotherapy, and chemotherapy. [name] breast cancer is minimally estrogen receptor positive and progesterone receptor negative.  It should be considered as functionally triple negative and treated as such.  Accordingly she will receive immunotherapy and chemotherapy as her main form of treatment.  Specifically she will receive pembrolizumab every 3 weeks to be continued for a year.  At the same time she starts the pembrolizumab she will start carboplatin given every 3 weeks x 4 with paclitaxel given weekly x12.  At the completion of the carboplatin and paclitaxel she will start doxorubicin and cyclophosphamide given every 3 weeks x 4.  She has a good understanding of the possible toxicities side effects and complications of treatment.  She will be restaged prior to the start of treatment and also have a breast MRI port placement and echocardiogram.  She will meet with our chemotherapy teaching nurse.  We are hoping to be able to start her chemotherapy on 04/21/2020.  She will meet with me that morning to make sure everything is in place and that all her questions have been answered.  I will then see her a week later to review initial symptoms.  Thereafter we will see her every 3 weeks until the completion of therapy.  In addition to the above, [name] does qualify for genetics testing. In patients who carry a deleterious mutation [for example in a  BRCA gene], the risk of a new breast cancer developing in the future may be sufficiently great that the patient may choose bilateral mastectomies. However if she wishes to keep her breasts in that situation it is safe to do so. That would require intensified screening, which generally means not only yearly mammography but a yearly breast MRI as well. Of course, if there is a deleterious mutation bilateral oophorectomy would  be necessary as there is no standard screening protocol for ovarian cancer.   [name] has a good  understanding of the overall plan. She agrees with it. She knows the goal of treatment in her case is cure. She will call with any problems that may develop before her next visit here.  Total encounter time 75 minutes.Kara Jews C. Adrean Heitz, MD 04/08/2020 6:41 PM Medical Oncology and Hematology [facility] [place], [place] [address] Tel. [phone]    Fax. [phone]   This document serves as a record of services personally performed by Kara Del, MD. It was created on his behalf by Wilburn Mylar, a trained medical scribe. The creation of this record is based on the scribe's personal observations and the provider's statements to them.   I, Kara Del MD, have reviewed the above documentation for accuracy and completeness, and I agree with the above.    *Total Encounter Time as defined by the Centers for Medicare and Medicaid Services includes, in addition to the face-to-face time of a patient visit (documented in the note above) non-face-to-face time: obtaining and reviewing outside history, ordering and reviewing medications, tests or procedures, care coordination (communications with other health care professionals or caregivers) and documentation in the medical record.

## 2020-04-08 NOTE — Patient Instructions (Signed)

## 2020-04-08 NOTE — Progress Notes (Signed)
Radiation Oncology         ([phone]) [phone] ________________________________  Name: [name]        MRN: [id number]  Date of Service: 04/08/2020 DOB: 06/08/1965  HA:[id number], Bernadene Bell, MD  Rolm Bookbinder, MD     REFERRING PHYSICIAN: Rolm Bookbinder, MD   DIAGNOSIS: The encounter diagnosis was Malignant neoplasm of upper-outer quadrant of right breast in female, estrogen receptor positive ([place]).   HISTORY OF PRESENT ILLNESS: [name] is a 55 y.o. female seen in the multidisciplinary breast clinic for a new diagnosis of right breast cancer. The patient was noted to have screening detected distortion of the right breast. Diagnostic imaging revealed a mass at 10:00 measuring 1.8 cm, and there was an intramammary noted at 10:00 as well as at least 3 abnormal nodes in the axilla. A biopsy on 03/31/20 revealed  A grade 2 invasive ductal carcinoma with DCIS of the 10:00 mass that was ER weakly positive, PR weakly positive, HER2 negative with a Ki 67 of 30%, functionally triple negative essentially. Her intramammary node at 10:00 was also consistent with invasive ductal carcinoma with perinodal tissue, ER weakly positive, PR negative, HER2 negative with a Ki 67 of 40%. An axillary node showed metastatic carcinoma. She's seen today to discuss treatment options of her cancer.   PREVIOUS RADIATION THERAPY: No   PAST MEDICAL HISTORY:  Past Medical History:  Diagnosis Date  . Allergy   . Eczema   . Hypertension        PAST SURGICAL HISTORY: Past Surgical History:  Procedure Laterality Date  . ABDOMINAL HYSTERECTOMY    . CESAREAN SECTION    . KNEE ARTHROSCOPY W/ ACL RECONSTRUCTION Left   . TUBAL LIGATION       FAMILY HISTORY:  Family History  Problem Relation Age of Onset  . Cancer Mother 29       Breast Cancer  . Breast cancer Mother 44  . Diabetes Father   . Kidney disease Father   . Hypertension Sister   . Heart disease Maternal Grandmother   . Breast cancer Paternal  Grandmother   . Cancer Maternal Aunt        Lung Cancer  . Heart disease Maternal Aunt   . Breast cancer Maternal Aunt      SOCIAL HISTORY:  reports that she has been smoking cigarettes. She has a 9.00 pack-year smoking history. She has never used smokeless tobacco. She reports current alcohol use. The patient is divorced and lives in [place]. 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 She's accompanied by her son [name]. She has another adult son who lives in [place].   ALLERGIES: Patient has no known allergies.   MEDICATIONS:  Current Outpatient Medications  Medication Sig Dispense Refill  . cetirizine (ZYRTEC) 10 MG tablet Take 1 tablet (10 mg total) by mouth daily. Before bed x 2 weeks then prn 30 tablet 2  . naproxen (NAPROSYN) 500 MG tablet Take 1 tablet (500 mg total) by mouth 2 (two) times daily. 30 tablet 0  . olmesartan (BENICAR) 20 MG tablet Take 20 mg by mouth daily.    Marland Kitchen tiZANidine (ZANAFLEX) 4 MG tablet Take 1 tablet (4 mg total) by mouth every 6 (six) hours as needed for muscle spasms. 30 tablet 0   No current facility-administered medications for this encounter.     REVIEW OF SYSTEMS: On review of systems, the patient reports that she is doing well overall. She denies any concerns  at this time specific to her breast but is taking all the information she can about her cancer.      PHYSICAL EXAM:  Wt Readings from Last 3 Encounters:  09/15/17 215 lb (97.5 kg)  11/24/14 172 lb (78 kg)  10/04/13 188 lb (85.3 kg)   Temp Readings from Last 3 Encounters:  02/17/20 98 F (36.7 C) (Oral)  09/15/17 98.1 F (36.7 C) (Oral)  11/24/14 98 F (36.7 C) (Oral)   BP Readings from Last 3 Encounters:  02/17/20 124/85  09/15/17 114/74  11/24/14 138/72   Pulse Readings from Last 3 Encounters:  02/17/20 82  09/15/17 92  11/24/14 80    In general this is a well appearing African American female in no acute distress. She's alert and oriented  x4 and appropriate throughout the examination. Cardiopulmonary assessment is negative for acute distress and she exhibits normal effort. Bilateral breast exam is deferred.    ECOG = 0  0 - Asymptomatic (Fully active, able to carry on all predisease activities without restriction)  1 - Symptomatic but completely ambulatory (Restricted in physically strenuous activity but ambulatory and able to carry out work of a light or sedentary nature. For example, light housework, office work)  2 - Symptomatic, <50% in bed during the day (Ambulatory and capable of all self care but unable to carry out any work activities. Up and about more than 50% of waking hours)  3 - Symptomatic, >50% in bed, but not bedbound (Capable of only limited self-care, confined to bed or chair 50% or more of waking hours)  4 - Bedbound (Completely disabled. Cannot carry on any self-care. Totally confined to bed or chair)  5 - Death   Eustace Pen MM, Creech RH, Tormey DC, et al. [phone]). "Toxicity and response criteria of the [facility]". [place] Oncol. 5 (6): 649-55    LABORATORY DATA:  Lab Results  Component Value Date   WBC 4.4 09/24/2013   HGB 12.2 09/24/2013   HCT 37.2 09/24/2013   MCV 89.2 09/24/2013   PLT 265 09/24/2013   Lab Results  Component Value Date   NA 141 09/24/2013   K 4.9 09/24/2013   CL 109 09/24/2013   CO2 28 09/24/2013   Lab Results  Component Value Date   ALT 19 09/24/2013   AST 20 09/24/2013   ALKPHOS 72 09/24/2013   BILITOT 0.3 09/24/2013      RADIOGRAPHY: US BREAST LTD UNI RIGHT INC AXILLA  Result Date: 03/26/2020 CLINICAL DATA:  Screening recall for possible right breast distortion and prominent intramammary lymph node. EXAM: DIGITAL DIAGNOSTIC UNILATERAL RIGHT MAMMOGRAM WITH TOMO AND CAD; ULTRASOUND RIGHT BREAST LIMITED COMPARISON:  Previous exams. ACR Breast Density Category b: There are scattered areas of fibroglandular density. FINDINGS: There is  persistent area of distortion in the upper-outer right breast with associated mass. Just posterosuperior to the mass is an intramammary lymph node measuring 0.6 cm, increased in size when compared to the prior exams. Mammographic images were processed with CAD. Physical examination of the upper-outer right breast really feels a large firm palpable mass. Targeted ultrasound of the right breast was performed. There is an ill-defined hypoechoic mass in the right breast at 10 o'clock 5 cm from nipple measuring 1.7 x 1.7 x 1.8 cm. This corresponds well with the mass/distortion seen in the upper-outer right breast at mammography, however feels much larger on physical exam than what is being visualized sonographically. There is an intramammary lymph node in the  right breast at 10 o'clock 7 cm from nipple measuring 0.6 x 0.5 x 0.5 cm. This corresponds with the abnormal lymph nodes seen in the upper-outer right breast at mammography. At least 3 morphologically abnormal lymph nodes are visualized in the right axilla. IMPRESSION: 1. Suspicious palpable mass in the right breast at 10 o'clock measuring up to 1.8 cm. Note that this mass feels much larger on physical exam than what is visualized sonographically. 2. Suspicious 0.6 cm intramammary lymph node in the right breast at the 10 o'clock position. 3. At least 3 morphologically abnormal lymph nodes in the right axilla. RECOMMENDATION: 1. Recommend ultrasound-guided biopsy of the mass in the right breast at the 10 o'clock position. 2. Recommend ultrasound-guided biopsy of the intramammary lymph node in the right breast at the 10 o'clock position. 3. Recommend ultrasound-guided biopsy of 1 of the lymph nodes in the right axilla. I have discussed the findings and recommendations with the patient. If applicable, a reminder letter will be sent to the patient regarding the next appointment. BI-RADS CATEGORY  5: Highly suggestive of malignancy. Electronically Signed   By: Everlean Alstrom M.D.   On: 03/26/2020 09:30   MM DIAG BREAST TOMO UNI RIGHT  Result Date: 03/26/2020 CLINICAL DATA:  Screening recall for possible right breast distortion and prominent intramammary lymph node. EXAM: DIGITAL DIAGNOSTIC UNILATERAL RIGHT MAMMOGRAM WITH TOMO AND CAD; ULTRASOUND RIGHT BREAST LIMITED COMPARISON:  Previous exams. ACR Breast Density Category b: There are scattered areas of fibroglandular density. FINDINGS: There is persistent area of distortion in the upper-outer right breast with associated mass. Just posterosuperior to the mass is an intramammary lymph node measuring 0.6 cm, increased in size when compared to the prior exams. Mammographic images were processed with CAD. Physical examination of the upper-outer right breast really feels a large firm palpable mass. Targeted ultrasound of the right breast was performed. There is an ill-defined hypoechoic mass in the right breast at 10 o'clock 5 cm from nipple measuring 1.7 x 1.7 x 1.8 cm. This corresponds well with the mass/distortion seen in the upper-outer right breast at mammography, however feels much larger on physical exam than what is being visualized sonographically. There is an intramammary lymph node in the right breast at 10 o'clock 7 cm from nipple measuring 0.6 x 0.5 x 0.5 cm. This corresponds with the abnormal lymph nodes seen in the upper-outer right breast at mammography. At least 3 morphologically abnormal lymph nodes are visualized in the right axilla. IMPRESSION: 1. Suspicious palpable mass in the right breast at 10 o'clock measuring up to 1.8 cm. Note that this mass feels much larger on physical exam than what is visualized sonographically. 2. Suspicious 0.6 cm intramammary lymph node in the right breast at the 10 o'clock position. 3. At least 3 morphologically abnormal lymph nodes in the right axilla. RECOMMENDATION: 1. Recommend ultrasound-guided biopsy of the mass in the right breast at the 10 o'clock position. 2. Recommend  ultrasound-guided biopsy of the intramammary lymph node in the right breast at the 10 o'clock position. 3. Recommend ultrasound-guided biopsy of 1 of the lymph nodes in the right axilla. I have discussed the findings and recommendations with the patient. If applicable, a reminder letter will be sent to the patient regarding the next appointment. BI-RADS CATEGORY  5: Highly suggestive of malignancy. Electronically Signed   By: Everlean Alstrom M.D.   On: 03/26/2020 09:30   [place] AXILLARY NODE CORE BIOPSY RIGHT  Addendum Date: 04/02/2020   ADDENDUM REPORT: 04/01/2020  12:43 ADDENDUM: Pathology revealed METASTATIC MAMMARY CARCINOMA INVOLVING A LYMPH NODE of the RIGHT axilla. This was found to be concordant by Dr. Audie Pinto. Pathology results were discussed with the patient by telephone. The patient reported doing well after the biopsy with tenderness at the site. Post biopsy instructions and care were reviewed and questions were answered. The patient was encouraged to call [facility] for any additional concerns. The patient has a recent diagnosis of RIGHT breast cancer and should follow her outlined treatment plan. Pathology results reported by Stacie Acres RN on 04/01/2020. Electronically Signed   By: Audie Pinto M.D.   On: 04/01/2020 12:43   Addendum Date: 04/01/2020   ADDENDUM REPORT: 04/01/2020 12:43 ADDENDUM: Pathology revealed METASTATIC MAMMARY CARCINOMA INVOLVING A LYMPH NODE of the RIGHT axilla. This was found to be concordant by Dr. Audie Pinto. Pathology results were discussed with the patient by telephone. The patient reported doing well after the biopsy with tenderness at the site. Post biopsy instructions and care were reviewed and questions were answered. The patient was encouraged to call [facility] for any additional concerns. The patient has a recent diagnosis of RIGHT breast cancer and should follow her outlined treatment  plan. Pathology results reported by Stacie Acres RN on 04/01/2020. Electronically Signed   By: Audie Pinto M.D.   On: 04/01/2020 12:43   Result Date: 04/01/2020 CLINICAL DATA:  55 year old female presenting for biopsy of a right axillary lymph node. EXAM: [place] AXILLARY NODE CORE BIOPSY RIGHT COMPARISON:  Previous exam(s). PROCEDURE: I met with the patient and we discussed the procedure of ultrasound-guided biopsy, including benefits and alternatives. We discussed the high likelihood of a successful procedure. We discussed the risks of the procedure, including infection, bleeding, tissue injury, clip migration, and inadequate sampling. Informed written consent was given. The usual time-out protocol was performed immediately prior to the procedure. Using sterile technique and 1% Lidocaine as local anesthetic, under direct ultrasound visualization, a 14 gauge spring-loaded device was used to perform biopsy of a right axillary lymph node using a lateral approach. At the conclusion of the procedure a heart tissue marker clip was deployed into the biopsy cavity. Follow up 2 view mammogram was performed and dictated separately. IMPRESSION: Ultrasound guided biopsy of a right axillary lymph node. No apparent complications. Electronically Signed: By: Audie Pinto M.D. On: 03/31/2020 15:15   MM CLIP PLACEMENT RIGHT  Result Date: 03/31/2020 CLINICAL DATA:  Post procedure mammogram for clip placement. Patient had two right breast biopsies performed on January 13 but a post clip mammogram was not performed at that time due to patient bleeding. EXAM: DIAGNOSTIC RIGHT MAMMOGRAM POST ULTRASOUND BIOPSY COMPARISON:  Previous exam(s). FINDINGS: Mammographic images were obtained following ultrasound guided biopsy of a right breast mass at 10 o'clock 5 cm from the nipple. The ribbon biopsy marking clip is in expected position at the site of biopsy. Mammographic images were obtained following ultrasound guided biopsy of a  right breast mass at 10 o'clock 7 cm from the nipple. The coil biopsy marking clip is in expected position at the site of biopsy. Mammographic images were obtained following ultrasound guided biopsy of a right axillary lymph node. The heart biopsy marking clip is in expected position at the site of biopsy. IMPRESSION: Appropriate positioning of the ribbon shaped biopsy marking clip at the site of biopsy in the right breast at 10 o'clock 5 cm from nipple. Appropriate positioning of the coil shaped biopsy  marking clip at the site of biopsy in the right breast at 10 o'clock 7 cm from nipple. Appropriate positioning of the heart shaped biopsy marking clip at the site of biopsy in the right axilla. Final Assessment: Post Procedure Mammograms for Marker Placement Electronically Signed   By: Audie Pinto M.D.   On: 03/31/2020 15:15   [place] RT BREAST BX W LOC DEV 1ST LESION IMG BX SPEC US GUIDE  Addendum Date: 04/02/2020   ADDENDUM REPORT: 04/01/2020 12:14 ADDENDUM: Pathology revealed GRADE II INVASIVE MAMMARY CARCINOMA. MAMMARY CARCINOMA IN SITU of the RIGHT breast, 10 o'clock, 5 cmfn. E-cadherin is positive supporting a ductal origin; however, please note that there is a lobular growth pattern. This was found to be concordant by Dr. Kristopher Oppenheim. Pathology revealed MAMMARY CARCINOMA INVOLVING LYMPH NODE AND PERINODAL SOFT TISSUE of the RIGHT breast, 10 o'clock, 7 cmfn. This was found to be concordant by Dr. Kristopher Oppenheim. Pathology results were discussed with the patient by telephone. The patient reported doing well after the biopsies with tenderness and minimal bleeding at the sites. Post biopsy instructions and care were reviewed and questions were answered. The patient was encouraged to call [facility] for any additional concerns. Due to bleeding after second biopsy, patient instructed to return for clip films and RIGHT axillary lymph node biopsy on 03/31/2020 at 2:45 PM. The patient  was referred to [facility] on April 08, 2020. Pathology results reported by Stacie Acres RN on 04/01/2020. Electronically Signed   By: Kristopher Oppenheim M.D.   On: 04/01/2020 12:14   Addendum Date: 04/01/2020   ADDENDUM REPORT: 04/01/2020 12:14 ADDENDUM: Pathology revealed GRADE II INVASIVE MAMMARY CARCINOMA. MAMMARY CARCINOMA IN SITU of the RIGHT breast, 10 o'clock, 5 cmfn. E-cadherin is positive supporting a ductal origin; however, please note that there is a lobular growth pattern. This was found to be concordant by Dr. Kristopher Oppenheim. Pathology revealed MAMMARY CARCINOMA INVOLVING LYMPH NODE AND PERINODAL SOFT TISSUE of the RIGHT breast, 10 o'clock, 7 cmfn. This was found to be concordant by Dr. Kristopher Oppenheim. Pathology results were discussed with the patient by telephone. The patient reported doing well after the biopsies with tenderness and minimal bleeding at the sites. Post biopsy instructions and care were reviewed and questions were answered. The patient was encouraged to call [facility] for any additional concerns. Due to bleeding after second biopsy, patient instructed to return for clip films and RIGHT axillary lymph node biopsy on 03/31/2020 at 2:45 PM. The patient was referred to [facility] on April 08, 2020. Pathology results reported by Stacie Acres RN on 04/01/2020. Electronically Signed   By: Kristopher Oppenheim M.D.   On: 04/01/2020 12:14   Result Date: 04/01/2020 CLINICAL DATA:  55 year old female with suspicious right breast masses and right axillary lymph node. EXAM: ULTRASOUND GUIDED RIGHT BREAST CORE NEEDLE BIOPSY COMPARISON:  Previous exam(s). PROCEDURE: I met with the patient and we discussed the procedure of ultrasound-guided biopsy, including benefits and alternatives. We discussed the high likelihood of a  successful procedure. We discussed the risks of the procedure, including infection, bleeding, tissue injury, clip migration, and inadequate sampling. Informed written consent was given. The usual time-out protocol was performed immediately prior to the procedure. Lesion quadrant: Upper outer quadrant Using sterile technique and 1% Lidocaine as local anesthetic, under direct ultrasound visualization, a  12 gauge spring-loaded device was used to perform biopsy of an irregular mass at the 10 o'clock position 5 cm from the nipple using a lateral approach. At the conclusion of the procedure a ribbon shaped tissue marker clip was deployed into the biopsy cavity. Lesion quadrant: Upper outer quadrant Using sterile technique and 1% Lidocaine as local anesthetic, under direct ultrasound visualization, a 12 gauge spring-loaded device was used to perform biopsy of of a mass at the 10 o'clock position 7 cm from the nipple using a lateral approach. The patient began having significant bleeding after a single core sample was taken. Firm pressure was held for approximately 15 minutes and the bleeding subsided. Subsequently, a coil shaped post biopsy clip was placed. At this time the patient began rebleeding significantly. Additional pressure was held for 10 minutes and a "quick clot" applied. At this point the bleeding subsided. However, follow-up mammogram and additional biopsy of a right axillary lymph node was deferred at this time. An appointment will be arranged for the patient to return for this. IMPRESSION: 1. Ultrasound guided biopsy of the right breast x2. This was complicated by significant bleeding after a single pass was obtained through the mass at the 10 o'clock position 7 cm from the nipple. Firm pressure was applied for multiple intervals and a quick clot and pressure dressing were placed. The bleeding eventually subsided and the patient was discharged in stable condition. She was given instructions if the bleeding  returns. 2. Additional biopsy of the right axilla and post biopsy clip films were deferred at this time. An appointment will be made for the patient to return soon to complete this. Electronically Signed: By: Kristopher Oppenheim M.D. On: 03/27/2020 12:32   [place] RT BREAST BX W LOC DEV EA ADD LESION IMG BX SPEC US GUIDE  Addendum Date: 04/02/2020   ADDENDUM REPORT: 04/01/2020 12:14 ADDENDUM: Pathology revealed GRADE II INVASIVE MAMMARY CARCINOMA. MAMMARY CARCINOMA IN SITU of the RIGHT breast, 10 o'clock, 5 cmfn. E-cadherin is positive supporting a ductal origin; however, please note that there is a lobular growth pattern. This was found to be concordant by Dr. Kristopher Oppenheim. Pathology revealed MAMMARY CARCINOMA INVOLVING LYMPH NODE AND PERINODAL SOFT TISSUE of the RIGHT breast, 10 o'clock, 7 cmfn. This was found to be concordant by Dr. Kristopher Oppenheim. Pathology results were discussed with the patient by telephone. The patient reported doing well after the biopsies with tenderness and minimal bleeding at the sites. Post biopsy instructions and care were reviewed and questions were answered. The patient was encouraged to call [facility] for any additional concerns. Due to bleeding after second biopsy, patient instructed to return for clip films and RIGHT axillary lymph node biopsy on 03/31/2020 at 2:45 PM. The patient was referred to [facility] at [facility] on April 08, 2020. Pathology results reported by Stacie Acres RN on 04/01/2020. Electronically Signed   By: Kristopher Oppenheim M.D.   On: 04/01/2020 12:14   Addendum Date: 04/01/2020   ADDENDUM REPORT: 04/01/2020 12:14 ADDENDUM: Pathology revealed GRADE II INVASIVE MAMMARY CARCINOMA. MAMMARY CARCINOMA IN SITU of the RIGHT breast, 10 o'clock, 5 cmfn. E-cadherin is positive supporting a ductal origin; however, please note that there is a lobular growth pattern. This was found to be  concordant by Dr. Kristopher Oppenheim. Pathology revealed MAMMARY CARCINOMA INVOLVING LYMPH NODE AND PERINODAL SOFT TISSUE of the RIGHT breast, 10 o'clock, 7 cmfn. This was found to be concordant  by Dr. Kristopher Oppenheim. Pathology results were discussed with the patient by telephone. The patient reported doing well after the biopsies with tenderness and minimal bleeding at the sites. Post biopsy instructions and care were reviewed and questions were answered. The patient was encouraged to call [facility] for any additional concerns. Due to bleeding after second biopsy, patient instructed to return for clip films and RIGHT axillary lymph node biopsy on 03/31/2020 at 2:45 PM. The patient was referred to [facility] on April 08, 2020. Pathology results reported by Stacie Acres RN on 04/01/2020. Electronically Signed   By: Kristopher Oppenheim M.D.   On: 04/01/2020 12:14   Result Date: 04/01/2020 CLINICAL DATA:  55 year old female with suspicious right breast masses and right axillary lymph node. EXAM: ULTRASOUND GUIDED RIGHT BREAST CORE NEEDLE BIOPSY COMPARISON:  Previous exam(s). PROCEDURE: I met with the patient and we discussed the procedure of ultrasound-guided biopsy, including benefits and alternatives. We discussed the high likelihood of a successful procedure. We discussed the risks of the procedure, including infection, bleeding, tissue injury, clip migration, and inadequate sampling. Informed written consent was given. The usual time-out protocol was performed immediately prior to the procedure. Lesion quadrant: Upper outer quadrant Using sterile technique and 1% Lidocaine as local anesthetic, under direct ultrasound visualization, a 12 gauge spring-loaded device was used to perform biopsy of an irregular mass at the 10 o'clock position 5 cm from the nipple using a lateral approach. At the conclusion of the procedure a  ribbon shaped tissue marker clip was deployed into the biopsy cavity. Lesion quadrant: Upper outer quadrant Using sterile technique and 1% Lidocaine as local anesthetic, under direct ultrasound visualization, a 12 gauge spring-loaded device was used to perform biopsy of of a mass at the 10 o'clock position 7 cm from the nipple using a lateral approach. The patient began having significant bleeding after a single core sample was taken. Firm pressure was held for approximately 15 minutes and the bleeding subsided. Subsequently, a coil shaped post biopsy clip was placed. At this time the patient began rebleeding significantly. Additional pressure was held for 10 minutes and a "quick clot" applied. At this point the bleeding subsided. However, follow-up mammogram and additional biopsy of a right axillary lymph node was deferred at this time. An appointment will be arranged for the patient to return for this. IMPRESSION: 1. Ultrasound guided biopsy of the right breast x2. This was complicated by significant bleeding after a single pass was obtained through the mass at the 10 o'clock position 7 cm from the nipple. Firm pressure was applied for multiple intervals and a quick clot and pressure dressing were placed. The bleeding eventually subsided and the patient was discharged in stable condition. She was given instructions if the bleeding returns. 2. Additional biopsy of the right axilla and post biopsy clip films were deferred at this time. An appointment will be made for the patient to return soon to complete this. Electronically Signed: By: Kristopher Oppenheim M.D. On: 03/27/2020 12:32       IMPRESSION/PLAN: 1. 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 Dr. Lisbeth Renshaw discusses the pathology findings and reviews the nature of node positive breast disease. The consensus from the breast conference includes  proceeding with neoadjuvant chemotherapy, with surgery to  be determined. Dr. Lisbeth Renshaw reviews the rationale for adjuvant radiotherapy to the right breast or chest wall and  regional nodes at the appropriate time.  We discussed the risks, benefits, short, and long term effects of radiotherapy, as well as the curative intent, and the patient is interested in proceeding. Dr. Lisbeth Renshaw discusses the delivery and logistics of radiotherapy and anticipates a course of 6 1/2 weeks of radiotherapy to the breast or chest wall as well as regional nodes. We will see her back a few weeks after surgery to discuss the simulation process and anticipate we starting radiotherapy about 4-6 weeks after surgery.  2. Possible genetic predisposition to malignancy. The patient is a candidate for genetic testing given her personal history. She was offered referral and is interested in meeting with genetics whom she will meet today..   In a visit lasting 60 minutes, greater than 50% of the time was spent face to face reviewing her case, as well as in preparation of, discussing, and coordinating the patient's care.  The above documentation reflects my direct findings during this shared patient visit. Please see the separate note by Dr. Lisbeth Renshaw on this date for the remainder of the patient's plan of care.    Carola Rhine, PAC

## 2020-04-08 NOTE — Progress Notes (Signed)
START ON PATHWAY REGIMEN - Breast     Cycles 1 through 4: A cycle is every 21 days:     Pembrolizumab      Paclitaxel      Carboplatin      Filgrastim-xxxx    Cycles 5 through 8: A cycle is every 21 days:     Pembrolizumab      Doxorubicin      Cyclophosphamide      Pegfilgrastim-xxxx   **Always confirm dose/schedule in your pharmacy ordering system**  Patient Characteristics: Preoperative or Nonsurgical Candidate (Clinical Staging), Neoadjuvant Therapy followed by Surgery, Invasive Disease, Chemotherapy, HER2 Negative/Unknown/Equivocal, ER Negative/Unknown, Platinum Therapy Indicated, High-Risk Disease Present Therapeutic Status: Preoperative or Nonsurgical Candidate (Clinical Staging) AJCC M Category: cM0 AJCC Grade: G2 Breast Surgical Plan: Neoadjuvant Therapy followed by Surgery ER Status: Negative (-) AJCC 8 Stage Grouping: IIIB HER2 Status: Negative (-) AJCC T Category: cT2 AJCC N Category: cN2 PR Status: Negative (-) Type of Therapy: Platinum Therapy Indicated Intent of Therapy: Curative Intent, Discussed with Patient

## 2020-04-08 NOTE — Progress Notes (Signed)
[facility]  Initial Assessment   [name] is a 55 y.o. year old female accompanied by patient and son, [name]. Clinical Social Work was referred by [facility] for assessment of psychosocial needs.   SDOH (Social Determinants of Health) assessments performed: Yes   Distress Screen completed: Yes ONCBCN DISTRESS SCREENING 04/08/2020  Screening Type Initial Screening  Distress experienced in past week (1-10) 4  Emotional problem type Nervousness/Anxiety  Information Concerns Type Lack of info about diagnosis;Lack of info about treatment  Physical Problem type Pain    Family/Social Information:  . Housing Arrangement: patient lives with son, [name]  (he may be moving out soon). Other son lives in [place] . Family members/support persons in your life? Family . Transportation concerns: no, should be able to drive self or have son help  . 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 May have access to FMLA and Short-term disability Income source: Employment . Financial concerns: Not currently o Type of concern: None . Food access concerns: no . Services Currently in place:  n/a  Coping/ Adjustment to diagnosis: . Patient understands treatment plan and what happens next? yes . Concerns about diagnosis and/or treatment: General adjustment to diagnosis and information . Patient reported stressors: Anxiety . Patient enjoys time with family, watching movies, her dog, her work . Current coping skills/ strengths: Active sense of humor, Capable of independent living, Motivation for treatment/growth and Supportive family/friends    SUMMARY: Current SDOH Barriers:  . No significant SDOH barriers noted today  Clinical Social Work Clinical Goal(s):  Marland Kitchen Patient will contact CSW if transportation or other needs arise  Interventions: . Discussed common feeling and emotions when being diagnosed with cancer, and the importance of support during  treatment . Informed patient of the support team roles and support services at [facility] . Provided CSW contact information and encouraged patient to call with any questions or concerns  Follow Up Plan: Patient will contact CSW with any support or resource needs Patient verbalizes understanding of plan: Yes    [name] , LCSW

## 2020-04-08 NOTE — Progress Notes (Signed)
REFERRING PROVIDER: Chauncey Cruel, MD [address] [place],  [place] [address]  PRIMARY PROVIDER:  [name], MD  PRIMARY REASON FOR VISIT:  1. Malignant neoplasm of upper-outer quadrant of right breast in female, estrogen receptor positive ([place])   2. Family history of breast cancer   3. Family history of lung cancer   4. Family history of prostate cancer      I connected with [name] on 04/08/2020 at 12:00 pm EDT by video conference and verified that I am speaking with the correct person using two identifiers.   Patient location: [facility] Provider location: [place] Office  HISTORY OF PRESENT ILLNESS:   [name], a 55 y.o. female, was seen for a [place] cancer genetics consultation at the request of Dr. [name] due to a personal and family history of cancer.  [name] presents to clinic today to discuss the possibility of a hereditary predisposition to cancer, genetic testing, and to further clarify her future cancer risks, as well as potential cancer risks for family members.   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 The treatment plan includes neoadjuvant chemotherapy, surgery, and radiation therapy.   CANCER HISTORY:  Oncology History  Malignant neoplasm of upper-outer quadrant of right breast in female, estrogen receptor positive ([place])  04/03/2020 Initial Diagnosis   Malignant neoplasm of upper-outer quadrant of right breast in female, estrogen receptor positive ([place])   04/21/2020 -  Chemotherapy    Patient is on Treatment Plan: BREAST PEMBROLIZUMAB + CARBOPLATIN D1 + PACLITAXEL D1,8,15 Q21D X 4 CYCLES / PEMBROLIZUMAB + AC Q21D X 4 CYCLES         RISK FACTORS:  Menarche was at age 82.  First live birth at age 89.  Ovaries intact: yes.  Hysterectomy: yes.  Menopausal status: postmenopausal.  HRT use: 0  years. Colonoscopy: yes; age 72. Mammogram within the last year: yes.   Past Medical History:  Diagnosis Date  . Allergy   . Arthritis 2018  . Breast cancer ([facility]) 03-27-2020  . Eczema   . Family history of breast cancer   . Family history of lung cancer   . Family history of prostate cancer   . Hypertension     Past Surgical History:  Procedure Laterality Date  . ABDOMINAL HYSTERECTOMY    . CESAREAN SECTION    . KNEE ARTHROSCOPY W/ ACL RECONSTRUCTION Left   . TUBAL LIGATION      Social History   Socioeconomic History  . Marital status: Divorced    Spouse name: Not on file  . Number of children: Not on file  . Years of education: Not on file  . Highest education level: Not on file  Occupational History  . Not on file  Tobacco Use  . Smoking status: Current Some Day Smoker    Packs/day: 0.25    Years: 36.00    Pack years: 9.00    Types: Cigarettes  . Smokeless tobacco: Never Used  . Tobacco comment: She is trying to quit.    Substance and Sexual Activity  . Alcohol use: Yes    Alcohol/week: 3.0 standard drinks    Types: 3 Glasses of wine per week    Comment: Occasional   . Drug use: Never  . Sexual activity: Yes    Partners: Male    Birth control/protection: Abstinence  Other Topics Concern  . Not on file  Social History Narrative   Marital Status: Divorced    Children:  Sons (2)    Pets: None   Living Situation: Lives alone   Occupation: Shipping Clerk Therapist, nutritional)     Education: Programmer, systems    Tobacco Use/Exposure:  She has smoked as much 1 ppd.  She has smoked for > 20 years.  She is trying to quit and is down to about 3-5 cigs per day.     Alcohol Use: 4 days/months  (3 drinks)    Drug Use:  None   Diet:  Regular   Exercise:  Walking/ Zumba/Basketball, Biking   Hobbies: Dancing/ Cooking                Social Determinants of Health   Financial Resource Strain: Not on file  Food Insecurity: No Food Insecurity  . Worried About  Charity fundraiser in the Last Year: Never true  . Ran Out of Food in the Last Year: Never true  Transportation Needs: No Transportation Needs  . Lack of Transportation (Medical): No  . Lack of Transportation (Non-Medical): No  Physical Activity: Not on file  Stress: Not on file  Social Connections: Not on file     FAMILY HISTORY:  We obtained a detailed, 4-generation family history.  Significant diagnoses are listed below: Family History  Problem Relation Age of Onset  . Breast cancer Mother 52  . Arthritis Mother   . Diabetes Father   . Kidney disease Father   . Hypertension Sister   . Heart disease Maternal Grandmother   . Breast cancer Paternal Grandmother        dx >50  . Heart disease Maternal Aunt   . Breast cancer Maternal Aunt 77  . Other Maternal Aunt        brain tumor (not cancerous)  . Lung cancer Maternal Aunt   . Prostate cancer Cousin 4       localized   [name] has two sons (ages 41 and 65). She has one maternal half-sister (age 83), as well as one paternal half-sister (age 63) and one paternal half-brother (age 50). None of these family members have had cancer.  [name] mother is 32 and has a history of breast cancer diagnosed at age 60. [name] had one maternal aunt and three maternal uncles. Her aunt had breast cancer diagnosed at age 56, lung cancer diagnosed at an unknown age, and was also diagnosed with a non-cancerous brain tumor. This aunt's son was recently diagnosed with localized prostate cancer at age 52. [name] maternal grandparents died older than 35 without cancer.  [name] father is 24 and has not had cancer. There are no paternal aunts or uncles. Her paternal grandmother was diagnosed with breast cancer older than 7. Her paternal grandfather died older than 44 without cancer.  [name] is unaware of previous family history of genetic testing for hereditary cancer risks. Patient's ancestors are of [place] and [place] descent,  according to [employer]. There is no reported Ashkenazi Jewish ancestry. There is no known consanguinity.  GENETIC COUNSELING ASSESSMENT: [name] is a 55 y.o. female with a personal history of breast cancer and a family history of breast and prostate cancer, which is somewhat suggestive of a hereditary cancer syndrome and predisposition to cancer. We, therefore, discussed and recommended the following at today's visit.   DISCUSSION: We discussed that approximately 5-10% of breast cancer is  hereditary, with most cases associated with the BRCA1 and BRCA2 genes. There are other genes that can be associated with hereditary breast cancer syndromes. These include ATM, CHEK2, PALB2, etc. We discussed that testing is beneficial for several reasons, including knowing about other cancer risks, identifying potential screening and risk-reduction options that may be appropriate, and to understand if other family members could be at risk for cancer and allow them to undergo genetic testing.   We reviewed the characteristics, features and inheritance patterns of hereditary cancer syndromes. We also discussed genetic testing, including the appropriate family members to test, the process of testing, insurance coverage and turn-around-time for results. We discussed the implications of a negative, positive and/or variant of uncertain significant result. We recommended [name] pursue genetic testing for the [facility] CustomNext-Cancer+RNAinsight gene panel.   The CustomNext-Cancer+RNAinsight panel offered by [facility] includes sequencing and rearrangement analysis for the following 47 genes:  APC, ATM, AXIN2, BARD1, BMPR1A, BRCA1, BRCA2, BRIP1, CDH1, CDK4, CDKN2A, CHEK2, DICER1, EPCAM, GREM1, HOXB13, MEN1, MLH1, MSH2, MSH3, MSH6, MUTYH, NBN, NF1, NF2, NTHL1, PALB2, PMS2, POLD1, POLE, PTEN, RAD51C, RAD51D, RECQL, RET, SDHA, SDHAF2, SDHB, SDHC, SDHD, SMAD4, SMARCA4, STK11, TP53, TSC1, TSC2, and VHL.  RNA data is routinely  analyzed for use in variant interpretation for all genes.  Based on [name]'s personal and family history of cancer, she meets medical criteria for genetic testing. Despite that she meets criteria, there may still be an out of pocket cost.  PLAN: After considering the risks, benefits, and limitations, [name] provided informed consent to pursue genetic testing and the blood sample was sent to [facility] for analysis of the CustomNext-Cancer + RNAinsight panel. Results should be available within approximately two-three weeks' time, at which Franco they will be disclosed by telephone to [name], as will any additional recommendations warranted by these results. [name] will receive a summary of her genetic counseling visit and a copy of her results once available. This information will also be available in Epic.   [name] questions were answered to her satisfaction today. Our contact information was provided should additional questions or concerns arise. Thank you for the referral and allowing [place] to share in the care of your patient.   Clint Guy, [place], [facility] Licensed, Certified Dispensing [email or web address] Phone: [phone]  The patient was seen for a total of 20 minutes in face-to-face genetic counseling.  This patient was discussed with Drs. Magrinat, Lindi Adie and/or Burr Medico who agrees with the above.    _______________________________________________________________________ For Office Staff:  Number of people involved in session: 1 Was an Intern/ student involved with case: no

## 2020-04-09 ENCOUNTER — Other Ambulatory Visit: Payer: Self-pay | Admitting: *Deleted

## 2020-04-13 ENCOUNTER — Telehealth: Payer: Self-pay | Admitting: *Deleted

## 2020-04-13 ENCOUNTER — Telehealth: Payer: Self-pay | Admitting: Oncology

## 2020-04-13 ENCOUNTER — Encounter: Payer: Self-pay | Admitting: *Deleted

## 2020-04-13 NOTE — Telephone Encounter (Signed)
Informed patient of her upcoming appointments. Patient is aware but requested more information regarding appointments.

## 2020-04-13 NOTE — Telephone Encounter (Signed)
Spoke with patient to follow up from [facility] 1/26 and assess navigation needs. Patient had some questions regarding her appointments. Reviewed appointments with her and she verbalized understanding.

## 2020-04-14 ENCOUNTER — Other Ambulatory Visit: Payer: Self-pay | Admitting: Oncology

## 2020-04-15 ENCOUNTER — Inpatient Hospital Stay: Payer: BC Managed Care – PPO | Attending: Oncology

## 2020-04-15 ENCOUNTER — Other Ambulatory Visit: Payer: Self-pay

## 2020-04-15 ENCOUNTER — Ambulatory Visit (HOSPITAL_BASED_OUTPATIENT_CLINIC_OR_DEPARTMENT_OTHER)
Admission: RE | Admit: 2020-04-15 | Discharge: 2020-04-15 | Disposition: A | Discharge status: Home / Self Care | Payer: BC Managed Care – PPO | Source: Ambulatory Visit | Attending: Oncology | Admitting: Oncology

## 2020-04-15 ENCOUNTER — Ambulatory Visit (HOSPITAL_COMMUNITY)
Admission: RE | Admit: 2020-04-15 | Discharge: 2020-04-15 | Disposition: A | Discharge status: Home / Self Care | Payer: BC Managed Care – PPO | Source: Ambulatory Visit | Attending: Oncology | Admitting: Oncology

## 2020-04-15 ENCOUNTER — Telehealth: Payer: Self-pay | Admitting: Oncology

## 2020-04-15 ENCOUNTER — Other Ambulatory Visit: Payer: Self-pay | Admitting: Oncology

## 2020-04-15 ENCOUNTER — Other Ambulatory Visit: Payer: Self-pay | Admitting: *Deleted

## 2020-04-15 DIAGNOSIS — Z170 Estrogen receptor positive status [ER+]: Secondary | ICD-10-CM

## 2020-04-15 DIAGNOSIS — C50411 Malignant neoplasm of upper-outer quadrant of right female breast: Secondary | ICD-10-CM | POA: Insufficient documentation

## 2020-04-15 DIAGNOSIS — Z8042 Family history of malignant neoplasm of prostate: Secondary | ICD-10-CM | POA: Insufficient documentation

## 2020-04-15 DIAGNOSIS — Z801 Family history of malignant neoplasm of trachea, bronchus and lung: Secondary | ICD-10-CM | POA: Insufficient documentation

## 2020-04-15 DIAGNOSIS — Z90710 Acquired absence of both cervix and uterus: Secondary | ICD-10-CM | POA: Insufficient documentation

## 2020-04-15 DIAGNOSIS — Z5112 Encounter for antineoplastic immunotherapy: Secondary | ICD-10-CM | POA: Insufficient documentation

## 2020-04-15 DIAGNOSIS — Z5111 Encounter for antineoplastic chemotherapy: Secondary | ICD-10-CM | POA: Insufficient documentation

## 2020-04-15 DIAGNOSIS — Z0189 Encounter for other specified special examinations: Secondary | ICD-10-CM | POA: Diagnosis not present

## 2020-04-15 DIAGNOSIS — Z5189 Encounter for other specified aftercare: Secondary | ICD-10-CM | POA: Insufficient documentation

## 2020-04-15 DIAGNOSIS — Z171 Estrogen receptor negative status [ER-]: Secondary | ICD-10-CM | POA: Insufficient documentation

## 2020-04-15 DIAGNOSIS — Z87891 Personal history of nicotine dependence: Secondary | ICD-10-CM | POA: Insufficient documentation

## 2020-04-15 DIAGNOSIS — Z79899 Other long term (current) drug therapy: Secondary | ICD-10-CM | POA: Insufficient documentation

## 2020-04-15 DIAGNOSIS — Z803 Family history of malignant neoplasm of breast: Secondary | ICD-10-CM | POA: Insufficient documentation

## 2020-04-15 DIAGNOSIS — C7951 Secondary malignant neoplasm of bone: Secondary | ICD-10-CM | POA: Insufficient documentation

## 2020-04-15 DIAGNOSIS — I10 Essential (primary) hypertension: Secondary | ICD-10-CM | POA: Insufficient documentation

## 2020-04-15 DIAGNOSIS — Z7982 Long term (current) use of aspirin: Secondary | ICD-10-CM | POA: Insufficient documentation

## 2020-04-15 LAB — ECHOCARDIOGRAM COMPLETE
Area-P 1/2: 5.27 cm2
S' Lateral: 3.3 cm

## 2020-04-15 MED ORDER — GADOBUTROL 1 MMOL/ML IV SOLN
10.0000 mL | Freq: Once | INTRAVENOUS | Status: AC | PRN
  Administered 2020-04-15: 10 mL via INTRAVENOUS

## 2020-04-15 MED ORDER — PROCHLORPERAZINE MALEATE 10 MG PO TABS: 10.0000 mg | ORAL_TABLET | Freq: Four times a day (QID) | ORAL | 1 refills | Status: DC | PRN

## 2020-04-15 MED ORDER — LIDOCAINE-PRILOCAINE 2.5-2.5 % EX CREA: TOPICAL_CREAM | CUTANEOUS | 3 refills | Status: DC

## 2020-04-15 NOTE — Progress Notes (Signed)
  Echocardiogram 2D Echocardiogram with 3D has been performed.  Darlina Sicilian M 04/15/2020, 9:25 AM

## 2020-04-15 NOTE — Telephone Encounter (Signed)
Rescheduled appt per 2/1 staff msg. Pt confirmed new arrival time.

## 2020-04-16 ENCOUNTER — Telehealth: Payer: Self-pay | Admitting: Oncology

## 2020-04-16 NOTE — Progress Notes (Signed)
Pharmacist Chemotherapy Monitoring - Initial Assessment    Anticipated start date: 04/24/20   Regimen:  . Are orders appropriate based on the patient's diagnosis, regimen, and cycle? Yes . Does the plan date match the patient's scheduled date? Yes . Is the sequencing of drugs appropriate? Yes . Are the premedications appropriate for the patient's regimen? Yes . Prior Authorization for treatment is: Not Started o If applicable, is the correct biosimilar selected based on the patient's insurance? No - will f/u w/ PA team  Organ Function and Labs: Marland Kitchen Are dose adjustments needed based on the patient's renal function, hepatic function, or hematologic function? No . Are appropriate labs ordered prior to the start of patient's treatment? Yes . Other organ system assessment, if indicated: anthracyclines: Echo/ MUGA . The following baseline labs, if indicated, have been ordered: N/A  Dose Assessment: . Are the drug doses appropriate? Yes . Are the following correct: o Drug concentrations Yes o IV fluid compatible with drug Yes o Administration routes Yes o Timing of therapy Yes . If applicable, does the patient have documented access for treatment and/or plans for port-a-cath placement? yes . If applicable, have lifetime cumulative doses been properly documented and assessed? yes Lifetime Dose Tracking  No doses have been documented on this patient for the following tracked chemicals: Doxorubicin, Epirubicin, Idarubicin, Daunorubicin, Mitoxantrone, Bleomycin, Oxaliplatin, Carboplatin, Liposomal Doxorubicin  o   Toxicity Monitoring/Prevention: . The patient has the following take home antiemetics prescribed: Prochlorperazine . The patient has the following take home medications prescribed: N/A . Medication allergies and previous infusion related reactions, if applicable, have been reviewed and addressed. Yes . The patient's current medication list has been assessed for drug-drug interactions  with their chemotherapy regimen. no significant drug-drug interactions were identified on review.  Order Review: . Are the treatment plan orders signed? Yes . Is the patient scheduled to see a provider prior to their treatment? Yes  I verify that I have reviewed each item in the above checklist and answered each question accordingly.   Kennith Center, Pharm.D., CPP 04/16/2020@9 :52 AM

## 2020-04-16 NOTE — Telephone Encounter (Signed)
Scheduled injection appointments per 2/3 schedule message. Patient is aware.

## 2020-04-17 ENCOUNTER — Encounter: Payer: Self-pay | Admitting: *Deleted

## 2020-04-20 ENCOUNTER — Ambulatory Visit (HOSPITAL_COMMUNITY)
Admission: RE | Admit: 2020-04-20 | Discharge: 2020-04-20 | Disposition: A | Discharge status: Home / Self Care | Payer: BC Managed Care – PPO | Source: Ambulatory Visit | Attending: Oncology | Admitting: Oncology

## 2020-04-20 ENCOUNTER — Other Ambulatory Visit: Payer: Self-pay

## 2020-04-20 ENCOUNTER — Other Ambulatory Visit (HOSPITAL_COMMUNITY): Payer: BC Managed Care – PPO

## 2020-04-20 ENCOUNTER — Encounter (HOSPITAL_COMMUNITY)
Admission: RE | Admit: 2020-04-20 | Discharge: 2020-04-20 | Disposition: A | Discharge status: Home / Self Care | Payer: BC Managed Care – PPO | Source: Ambulatory Visit | Attending: Oncology | Admitting: Oncology

## 2020-04-20 ENCOUNTER — Other Ambulatory Visit (HOSPITAL_COMMUNITY)
Admission: RE | Admit: 2020-04-20 | Discharge: 2020-04-20 | Disposition: A | Discharge status: Home / Self Care | Payer: BC Managed Care – PPO | Source: Ambulatory Visit | Attending: General Surgery | Admitting: General Surgery

## 2020-04-20 ENCOUNTER — Encounter (HOSPITAL_COMMUNITY): Payer: Self-pay

## 2020-04-20 ENCOUNTER — Other Ambulatory Visit: Payer: Self-pay | Admitting: Oncology

## 2020-04-20 DIAGNOSIS — I700 Atherosclerosis of aorta: Secondary | ICD-10-CM | POA: Diagnosis not present

## 2020-04-20 DIAGNOSIS — C50411 Malignant neoplasm of upper-outer quadrant of right female breast: Secondary | ICD-10-CM | POA: Insufficient documentation

## 2020-04-20 DIAGNOSIS — Z20822 Contact with and (suspected) exposure to covid-19: Secondary | ICD-10-CM | POA: Insufficient documentation

## 2020-04-20 DIAGNOSIS — Z170 Estrogen receptor positive status [ER+]: Secondary | ICD-10-CM | POA: Diagnosis not present

## 2020-04-20 DIAGNOSIS — Z01812 Encounter for preprocedural laboratory examination: Secondary | ICD-10-CM | POA: Diagnosis present

## 2020-04-20 DIAGNOSIS — C7951 Secondary malignant neoplasm of bone: Secondary | ICD-10-CM | POA: Diagnosis not present

## 2020-04-20 LAB — SARS CORONAVIRUS 2 (TAT 6-24 HRS): SARS Coronavirus 2: NEGATIVE

## 2020-04-20 MED ORDER — IOHEXOL 300 MG/ML  SOLN
75.0000 mL | Freq: Once | INTRAMUSCULAR | Status: AC | PRN
  Administered 2020-04-20: 75 mL via INTRAVENOUS

## 2020-04-20 MED ORDER — TECHNETIUM TC 99M MEDRONATE IV KIT
20.9000 | PACK | Freq: Once | INTRAVENOUS | Status: AC | PRN
  Administered 2020-04-20: 20.9 via INTRAVENOUS

## 2020-04-20 NOTE — Progress Notes (Signed)
[facility]  [phone]) [phone] [phone]     ID: [name] DOB: 1965/06/24  MR#: [id number]  YOM#:[id number]  Patient Care Team: [name], MD as PCP - General (Family Medicine) Mauro Kaufmann, RN as Oncology Nurse Navigator Rockwell Germany, RN as Oncology Nurse Navigator Rolm Bookbinder, MD as Consulting Physician (General Surgery) Chaun Uemura, Virgie Dad, MD as Consulting Physician (Oncology) Kyung Rudd, MD as Consulting Physician (Radiation Oncology) Cheri Fowler, MD as Consulting Physician (Obstetrics and Gynecology) Elsie Saas, MD as Consulting Physician (Orthopedic Surgery) Juanita Craver, MD as Consulting Physician (Gastroenterology) Chauncey Cruel, MD OTHER MD:  CHIEF COMPLAINT: Triple negative breast cancer  CURRENT TREATMENT: Neoadjuvant chemotherapy   INTERVAL HISTORY: [name] returns today for follow up of her triple negative breast cancer. She was evaluated in the multidisciplinary breast cancer clinic on 04/08/2020.  Since consultation, she underwent echocardiogram on 04/15/2020 showing an ejection fraction of 55-60%.   She also underwent breast MRI the same day showing: breast composition B; 4.5 cm area of biopsy-proven malignancy in upper-outer right breast extending into anterior upper-inner quadrant and from superior retroareolar region posteriorly to level of pectoralis major muscle laterally; edema and mild patchy enhancement in lateral aspect of pectoralis muscle on right; biopsy-proven metastatic intramammary lymph node within patchy non-mass enhancement in upper-outer right breast; at least 12 metastatic right axillary lymph nodes, including 2 retropectoral nodes in infraclavicular region; 8 mm enhancing sternal mass on left and several smaller enhancing masses in manubrium on left; nonspecific 3 mm right middle lobe nodule.  She also underwent staging chest CT and bone scan yesterday, 04/20/2020. Chest CT showed:  known right breast mass, associated with axillary adenopathy and widespread bony lesions with both lytic and sclerotic changes involving nearly every level of spine with destruction and soft tissue about right 9th rib posteriorly; potential left thoracic inlet adenopathy; T6 compression appears more likely to be chronic; pleural nodularity versus focal atelectasis in left lung base. PET scan and spinal MRI recommended for further evaluation.  Bone scan showed multifocal bone metastases corresponding to the findings on the chest CT scan.   REVIEW OF SYSTEMS: [name] has remarkably few symptoms given the findings on the recent scans.  Specifically she denies any bone pain.  There have been no unusual headaches visual changes nausea vomiting gait imbalance or falls.  A detailed review of systems was otherwise stable.   COVID 19 VACCINATION STATUS: fully vaccinated Therapist, music), with booster   HISTORY OF CURRENT ILLNESS: From the original intake note:  [name] had routine screening mammography showing a possible abnormality in the right breast and a prominent intramammary lymph node. She underwent right diagnostic mammography with tomography and right breast ultrasonography at [facility] on 03/26/2020 showing: breast density category B; palpable 1.8 cm right breast mass at 10 o'clock, which feels much larger than what is visualized sonographically; suspicious 0.6 cm intramammary lymph node in right breast at 10 o'clock; at least 3 morphologically abnormal lymph nodes in right axilla.  Accordingly on 03/27/2020 she proceeded to biopsy of the right breast areas in question. The pathology from this procedure ([id number]) showed:  1. Right Breast, 10 o'clock (mass)  - invasive mammary carcinoma, e-cadherin positive with a lobular growth pattern, grade 2  - mammary carcinoma in situ  - Prognostic indicators significant for: estrogen receptor, 1% positive and progesterone receptor, 1% positive,  both with strong staining intensity. Proliferation marker Ki67 at 30%. HER2 negative by immunohistochemistry (0). 2. Right  Breast, 10 o'clock (intramammary node)  - mammary carcinoma involving lymph node and perinodal soft tissue  -  Prognostic indicators significant for: estrogen receptor, 1% positive with strong staining intensity and progesterone receptor, 0% negative. Proliferation marker Ki67 at 40%. HER2 negative by immunohistochemistry (0).  Cancer Staging Malignant neoplasm of upper-outer quadrant of right breast in female, estrogen receptor positive ([place]) Staging form: Breast, AJCC 8th Edition - Clinical stage from 04/08/2020: Stage IIB (cT1c, cN1(f), cM0, G2, ER-, PR-, HER2-) - Signed by Chauncey Cruel, MD on 04/08/2020 Stage prefix: Initial diagnosis Method of lymph node assessment: Core biopsy  The patient's subsequent history is as detailed below.   PAST MEDICAL HISTORY: Past Medical History:  Diagnosis Date  . Allergy   . Arthritis 2018  . Breast cancer ([place]) 03-27-2020   right  . Eczema   . Family history of breast cancer   . Family history of lung cancer   . Family history of prostate cancer   . Hypertension     PAST SURGICAL HISTORY: Past Surgical History:  Procedure Laterality Date  . ABDOMINAL HYSTERECTOMY    . CESAREAN SECTION    . KNEE ARTHROSCOPY W/ ACL RECONSTRUCTION Left   . TUBAL LIGATION      FAMILY HISTORY: Family History  Problem Relation Age of Onset  . Breast cancer Mother 56  . Arthritis Mother   . Diabetes Father   . Kidney disease Father   . Hypertension Sister   . Heart disease Maternal Grandmother   . Breast cancer Paternal Grandmother        dx >50  . Heart disease Maternal Aunt   . Breast cancer Maternal Aunt 77  . Other Maternal Aunt        brain tumor (not cancerous)  . Lung cancer Maternal Aunt   . Prostate cancer Cousin 43       localized   Her parents are both living, her father age 10 and mother age 62, as of 03/2020.  [name] has 1 brother and 2 sisters. She reports breast cancer in her mother at age 54, her maternal aunt, and her paternal grandmother. Her maternal aunt also has a history of lung cancer. She also reports prostate cancer in a maternal cousin in his 21's.   GYNECOLOGIC HISTORY:  No LMP recorded. Patient has had a hysterectomy. Menarche: 55 years old Age at first live birth: 55 years old [place] P 2 LMP 2010 with hysterectomy Contraceptive: prior use without issue HRT never used  Hysterectomy? Yes, 2010 for fibroids BSO? 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 She is divorced. She lives at home with son [name], age 16, who works for [employer]. Son [name], [name], age 25, works as a Development worker, international aid in [place], [place]. [name] has no grandchildren. She attends [employer].    ADVANCED DIRECTIVES: not in place; considering naming mother [name], son [name], or son [name] as healthcare powers of attorney   HEALTH MAINTENANCE: Social History   Tobacco Use  . Smoking status: Current Some Day Smoker    Packs/day: 0.25    Years: 36.00    Pack years: 9.00    Types: Cigarettes  . Smokeless tobacco: Never Used  . Tobacco comment: She is trying to quit.    Substance Use Topics  . Alcohol use: Yes    Alcohol/week: 3.0 standard drinks    Types: 3 Glasses of wine per week  Comment: Occasional   . Drug use: Never     Colonoscopy: age 48  PAP: date unsure  Bone density: never done   No Known Allergies  Current Outpatient Medications  Medication Sig Dispense Refill  . acetaminophen (TYLENOL) 500 MG tablet Take 1,000 mg by mouth every 6 (six) hours as needed for moderate pain.    Marland Kitchen aspirin EC 81 MG tablet Take 81 mg by mouth daily. Swallow whole.    . Boric Acid Vaginal 600 MG SUPP Place 600 mg vaginally daily as needed (yeast infections).    . Cholecalciferol (VITAMIN D) 125 MCG (5000  UT) CAPS Take 5,000 Units by mouth daily.    . clobetasol ointment (TEMOVATE) 1.47 % Apply 1 application topically 2 (two) times daily as needed (rash/itching).    . Cyanocobalamin (B-12) 5000 MCG CAPS Take 5,000 mcg by mouth daily.    Marland Kitchen lidocaine-prilocaine (EMLA) cream Apply to affected area once (Patient taking differently: Apply 1 application topically daily as needed (port access).) 30 g 3  . loperamide (IMODIUM A-D) 2 MG tablet Take 2 mg by mouth 4 (four) times daily as needed for diarrhea or loose stools.    Marland Kitchen loratadine (CLARITIN) 10 MG tablet Take 10 mg by mouth daily.    Marland Kitchen olmesartan-hydrochlorothiazide (BENICAR HCT) 40-25 MG tablet Take 1 tablet by mouth every morning.    . prochlorperazine (COMPAZINE) 10 MG tablet Take 1 tablet (10 mg total) by mouth every 6 (six) hours as needed (Nausea or vomiting). 30 tablet 1   No current facility-administered medications for this visit.    OBJECTIVE: African-American woman who appears stated age  55:   04/21/20 1250  BP: (!) 142/71  Pulse: 93  Resp: 18  Temp: 97.7 F (36.5 C)  SpO2: 100%     Body mass index is 32.07 kg/m.   Wt Readings from Last 3 Encounters:  04/21/20 217 lb 3.2 oz (98.5 kg)  04/08/20 219 lb 6.4 oz (99.5 kg)  09/15/17 215 lb (97.5 kg)      ECOG FS:1 - Symptomatic but completely ambulatory  Sclerae unicteric, EOMs intact Wearing a mask No cervical or supraclavicular adenopathy Lungs no rales or rhonchi Heart regular rate and rhythm Abd soft, nontender, positive bowel sounds MSK no focal spinal tenderness, no upper extremity lymphedema Neuro: nonfocal, well oriented, appropriate affect Breasts: The right breast shows a diffuse mass in the upper quadrants, with no skin or nipple involvement.  The left breast is benign.  Both axillae are benign.   LAB RESULTS:  CMP     Component Value Date/Time   NA 137 04/08/2020 0814   K 3.7 04/08/2020 0814   CL 102 04/08/2020 0814   CO2 26 04/08/2020 0814    GLUCOSE 96 04/08/2020 0814   BUN 18 04/08/2020 0814   CREATININE 0.88 04/08/2020 0814   CREATININE 0.69 09/24/2013 0819   CALCIUM 10.0 04/08/2020 0814   PROT 8.0 04/08/2020 0814   ALBUMIN 4.2 04/08/2020 0814   AST 18 04/08/2020 0814   ALT 16 04/08/2020 0814   ALKPHOS 118 04/08/2020 0814   BILITOT 0.3 04/08/2020 0814   GFRNONAA >60 04/08/2020 0814   GFRNONAA >89 09/24/2013 0819   GFRAA >89 09/24/2013 0819    No results found for: TOTALPROTELP, ALBUMINELP, A1GS, A2GS, BETS, BETA2SER, GAMS, MSPIKE, SPEI  Lab Results  Component Value Date   WBC 6.9 04/08/2020   NEUTROABS 3.7 04/08/2020   HGB 11.4 (L) 04/08/2020   HCT 36.2 04/08/2020   MCV 89.8  04/08/2020   PLT 288 04/08/2020    No results found for: LABCA2  No components found for: IEPPIR518  No results for input(s): INR in the last 168 hours.  No results found for: LABCA2  No results found for: ACZ660  No results found for: YTK160  No results found for: FUX323  No results found for: CA2729  No components found for: HGQUANT  No results found for: CEA1 / No results found for: CEA1   No results found for: AFPTUMOR  No results found for: CHROMOGRNA  No results found for: KPAFRELGTCHN, LAMBDASER, KAPLAMBRATIO (kappa/lambda light chains)  No results found for: HGBA, HGBA2QUANT, HGBFQUANT, HGBSQUAN (Hemoglobinopathy evaluation)   No results found for: LDH  No results found for: IRON, TIBC, IRONPCTSAT (Iron and TIBC)  No results found for: FERRITIN  Urinalysis No results found for: COLORURINE, APPEARANCEUR, LABSPEC, PHURINE, GLUCOSEU, HGBUR, BILIRUBINUR, KETONESUR, PROTEINUR, UROBILINOGEN, NITRITE, LEUKOCYTESUR   STUDIES: CT Chest W Contrast  Result Date: 04/20/2020 CLINICAL DATA:  Breast cancer staging, new breast cancer diagnosis. 55 year old female with new diagnosis of breast cancer in January 2022, planning for immuno and chemotherapy. EXAM: CT CHEST WITH CONTRAST TECHNIQUE: Multidetector CT imaging of  the chest was performed during intravenous contrast administration. CONTRAST:  12m OMNIPAQUE IOHEXOL 300 MG/ML  SOLN COMPARISON:  None FINDINGS: Cardiovascular: Normal caliber thoracic aorta. Heart size top normal. No pericardial effusion. Central pulmonary vasculature unremarkable on venous phase normal caliber. Mediastinum/Nodes: RIGHT axillary adenopathy. Largest lymph node measuring 1.7 x 1.9 cm (image 39, series 2) Lymph nodes track towards subpectoral nodal stations (image 26, series 2) 11 mm lymph node with smaller lymph nodes with rounded morphologic features tracking beneath the RIGHT pectoral musculature best seen on image 21 of series 2. LEFT thoracic inlet with numerous small lymph nodes suspicious given other findings tracking into the LEFT low neck (image 14, series 2 6 mm and image 7 of series 2 7 mm. Thyroid grossly normal. No LEFT axillary lymphadenopathy the Known breast mass may be indicated by asymmetric density in the RIGHT breast as compared to the LEFT with biopsy clips in the area of the upper outer RIGHT breast. Internal mammary nodal prominence on the RIGHT 4 mm on image 37 of series 2 No hilar or mediastinal lymphadenopathy.  Esophagus mildly patulous. Lungs and pleura: Basilar atelectasis. Chest wall mass, see below. Pleural nodularity in the LEFT lung base at 9 mm on image 94 of series 7 airways are patent. Upper Abdomen: Imaged portions of liver, gallbladder, pancreas, spleen and adrenal glands are normal. Kidneys in the upper poles enhance symmetrically. No acute gastrointestinal process. Musculoskeletal: Destructive lesion involving the RIGHT posterior ninth rib measures 2.2 x 1.5 cm and is associated with subacute appearing pathologic fracture. Multifocal lytic changes seen in the ribs and in the spine. Lytic process in the RIGHT second rib (image 14, series 2) third rib with lytic and sclerotic changes on image 24 of series 2 also on the RIGHT. Similar involvement of the RIGHT  fourth rib and sixth rib Subtle lytic change in the LEFT posterior fifth rib on image 48 of series 2. Multilevel spinal involvement, lytic and sclerotic changes at at multiple levels, essentially every visualized level of the spine. Example of lytic changes seen at T12 violating the posterior cortex along the LEFT posterolateral vertebral body with central canal soft tissue. Another lytic focus with central soft tissue on image 99 of series 2 measuring 16 mm at thew T9 level as another example of this process.  Loss of height at T6 appears more likely to be chronic but is age indeterminate. IMPRESSION: 1. Soft tissue in the RIGHT breast presumably at the site of known breast mass, associated with axillary adenopathy and widespread bony lesions with both lytic and sclerotic changes involving nearly every level of the spine and with destruction and soft tissue about the RIGHT ninth rib posteriorly. 2. Potential LEFT thoracic inlet adenopathy as well. PET scan may be helpful for complete staging in this patient. 3. Given extensive spinal involvement and posterior cortical disruption and soft tissue extending in the central canal at the T11 level, spinal MRI may also be helpful. 4. T6 compression appears more likely to be chronic. Correlate with any symptoms in this location, approximately 40% loss of height at this level. 5. Pleural nodularity versus focal atelectasis in the LEFT lung base, attention on follow-up. 6. Aortic atherosclerosis. These results will be called to the ordering clinician or representative by the Radiologist Assistant, and communication documented in the PACS or [employer]. Aortic Atherosclerosis (ICD10-I70.0). Electronically Signed   By: Zetta Bills M.D.   On: 04/20/2020 11:42   NM Bone Scan Whole Body  Result Date: 04/21/2020 CLINICAL DATA:  Breast cancer, staging new breast cancer staging prior to chemo/metastatic lymph node EXAM: NUCLEAR MEDICINE WHOLE BODY BONE SCAN TECHNIQUE:  Whole body anterior and posterior images were obtained approximately 3 hours after intravenous injection of radiopharmaceutical. RADIOPHARMACEUTICALS:  20.9 mCi Technetium-75mMDP IV COMPARISON:  CT 04/20/2020 FINDINGS: Along the inferior medial border of the RIGHT scapula there is a rim of intense radiotracer activity which corresponds to a mixed lytic lesion on CT. Focal uptake posterior RIGHT ninth rib corresponds to pathologic fracture on comparison CT On comparison CT there several small sclerotic lesions in the spine as well as a lytic lesion at T9. These are not well appreciated on the bone scan. There is intense uptake associated with the LEFT knee involving the femoral condyles and tibial plateau. Tiny focus of uptake in the midshaft LEFT tibia. IMPRESSION: 1. Multifocal skeletal metastasis corresponds to findings on comparison CT chest. 2. Uptake in the LEFT knee is favored severe degenerative arthropathy. Electronically Signed   By: SSuzy BouchardM.D.   On: 04/21/2020 07:42   MR BREAST BILATERAL W WO CONTRAST INC CAD  Result Date: 04/17/2020 CLINICAL DATA:  Recently diagnosed invasive mammary carcinoma and mammary carcinoma in situ in the 10 o'clock position of the right breast. Recently diagnosed metastatic intramammary lymph node in the 10 o'clock position of the right breast. Recently diagnosed metastatic right axillary adenopathy. LABS:  None obtained on site today. EXAM: BILATERAL BREAST MRI WITH AND WITHOUT CONTRAST TECHNIQUE: Multiplanar, multisequence MR images of both breasts were obtained prior to and following the intravenous administration of 10 ml of Gadavist Three-dimensional MR images were rendered by post-processing of the original MR data on an independent workstation. The three-dimensional MR images were interpreted, and findings are reported in the following complete MRI report for this study. Three dimensional images were evaluated at the independent interpreting workstation  using the DynaCAD thin client. COMPARISON:  Bilateral screening mammogram dated 02/25/2020, right diagnostic mammogram and right breast ultrasound dated 03/26/2020 and subsequent right breast and right axillary ultrasound-guided core needle biopsies. FINDINGS: Breast composition: b. Scattered fibroglandular tissue. Background parenchymal enhancement: Mild. Right breast: Irregular, spiculated enhancing mass in the 10 o'clock position of the right breast, middle depth, containing a biopsy marker clip artifact. This measures 4.5 x 2.9 cm on image number 51  series 16 and 1.9 cm in cephalocaudal dimension in the sagittal plane. This has predominantly persistent enhancement kinetics with some plateau and rapid wash-in/washout kinetics. There is associated patchy non mass enhancement extending anteriorly and posteriorly with linear extensions, spanning 9.2 x 3.1 cm on image number 54 series 16. This is also in the upper outer quadrant and extends from the superior retroareolar region to the pectoralis major muscle with mild patchy enhancement in outer portions of the pectoralis major muscle on the right as well as edema in the muscle. Anteriorly, the non mass enhancement extends into the anterior aspect of the upper inner quadrant of the breast and is somewhat more focal and confluent in that area. This also has predominantly persistent enhancement kinetics with some plateau and rapid wash-in/washout kinetics. The recently biopsied intramammary lymph node is within the area of patchy non mass enhancement in the upper-outer quadrant of the right breast, slightly posteriorly. This contains a biopsy marker clip artifact laterally and has predominantly rapid wash-in/washout enhancement kinetics. Left breast: Nodular background parenchymal enhancement. No mass or abnormal enhancement suspicious for malignancy. Lymph nodes: There are at least 12 abnormally enlarged right axillary lymph nodes with abnormal morphology, including  the biopsied lymph node, containing a biopsy marker clip artifact in the inferior axilla. Two of the enlarged lymph nodes are retropectoral nodes in the infraclavicular region. No enlarged internal mammary lymph nodes are seen and there is no adenopathy on the left. Ancillary findings: 8 mm enhancing mass in the inferior sternum on the left. There are also several small rounded and somewhat patchy areas of enhancement in the manubrium on the left, near the sternoclavicular joint. A 3 mm enhancing nodule is demonstrated in the anterior right middle lobe medially on image number 31 series 5. IMPRESSION: 1. 4.5 x 2.9 x 1.9 cm area of biopsy-proven invasive mammary carcinoma and mammary carcinoma in situ in the upper-outer quadrant of the right breast and extending into the anterior aspect of the upper inner quadrant of the right breast. This extends from the superior retroareolar region posteriorly to the level of the pectoralis major muscle laterally. 2. Edema and mild patchy enhancement in the lateral aspect of the pectoralis muscle on the right. This could represent post biopsy changes or small areas of tumor invasion. 3. Biopsy-proven metastatic intramammary lymph node within the patchy non mass enhancement in the upper-outer quadrant of the right breast, slightly posteriorly. 4. At least 12 metastatic right axillary lymph nodes, including 2 retropectoral nodes in the infraclavicular region. 5. 8 mm enhancing sternal mass on the left and several smaller enhancing masses in the manubrium on the left. These are suspicious for the possibility of bony metastatic disease. 6. 3 mm right middle lobe nodule. This is nonspecific and of low suspicion for malignancy or metastatic disease. RECOMMENDATION: Treatment plan. BI-RADS CATEGORY  6: Known biopsy-proven malignancy. Electronically Signed   By: Claudie Revering M.D.   On: 04/17/2020 08:45   US BREAST LTD UNI RIGHT INC AXILLA  Result Date: 03/26/2020 CLINICAL DATA:   Screening recall for possible right breast distortion and prominent intramammary lymph node. EXAM: DIGITAL DIAGNOSTIC UNILATERAL RIGHT MAMMOGRAM WITH TOMO AND CAD; ULTRASOUND RIGHT BREAST LIMITED COMPARISON:  Previous exams. ACR Breast Density Category b: There are scattered areas of fibroglandular density. FINDINGS: There is persistent area of distortion in the upper-outer right breast with associated mass. Just posterosuperior to the mass is an intramammary lymph node measuring 0.6 cm, increased in size when compared to the prior exams.  Mammographic images were processed with CAD. Physical examination of the upper-outer right breast really feels a large firm palpable mass. Targeted ultrasound of the right breast was performed. There is an ill-defined hypoechoic mass in the right breast at 10 o'clock 5 cm from nipple measuring 1.7 x 1.7 x 1.8 cm. This corresponds well with the mass/distortion seen in the upper-outer right breast at mammography, however feels much larger on physical exam than what is being visualized sonographically. There is an intramammary lymph node in the right breast at 10 o'clock 7 cm from nipple measuring 0.6 x 0.5 x 0.5 cm. This corresponds with the abnormal lymph nodes seen in the upper-outer right breast at mammography. At least 3 morphologically abnormal lymph nodes are visualized in the right axilla. IMPRESSION: 1. Suspicious palpable mass in the right breast at 10 o'clock measuring up to 1.8 cm. Note that this mass feels much larger on physical exam than what is visualized sonographically. 2. Suspicious 0.6 cm intramammary lymph node in the right breast at the 10 o'clock position. 3. At least 3 morphologically abnormal lymph nodes in the right axilla. RECOMMENDATION: 1. Recommend ultrasound-guided biopsy of the mass in the right breast at the 10 o'clock position. 2. Recommend ultrasound-guided biopsy of the intramammary lymph node in the right breast at the 10 o'clock position. 3.  Recommend ultrasound-guided biopsy of 1 of the lymph nodes in the right axilla. I have discussed the findings and recommendations with the patient. If applicable, a reminder letter will be sent to the patient regarding the next appointment. BI-RADS CATEGORY  5: Highly suggestive of malignancy. Electronically Signed   By: Everlean Alstrom M.D.   On: 03/26/2020 09:30   MM DIAG BREAST TOMO UNI RIGHT  Result Date: 03/26/2020 CLINICAL DATA:  Screening recall for possible right breast distortion and prominent intramammary lymph node. EXAM: DIGITAL DIAGNOSTIC UNILATERAL RIGHT MAMMOGRAM WITH TOMO AND CAD; ULTRASOUND RIGHT BREAST LIMITED COMPARISON:  Previous exams. ACR Breast Density Category b: There are scattered areas of fibroglandular density. FINDINGS: There is persistent area of distortion in the upper-outer right breast with associated mass. Just posterosuperior to the mass is an intramammary lymph node measuring 0.6 cm, increased in size when compared to the prior exams. Mammographic images were processed with CAD. Physical examination of the upper-outer right breast really feels a large firm palpable mass. Targeted ultrasound of the right breast was performed. There is an ill-defined hypoechoic mass in the right breast at 10 o'clock 5 cm from nipple measuring 1.7 x 1.7 x 1.8 cm. This corresponds well with the mass/distortion seen in the upper-outer right breast at mammography, however feels much larger on physical exam than what is being visualized sonographically. There is an intramammary lymph node in the right breast at 10 o'clock 7 cm from nipple measuring 0.6 x 0.5 x 0.5 cm. This corresponds with the abnormal lymph nodes seen in the upper-outer right breast at mammography. At least 3 morphologically abnormal lymph nodes are visualized in the right axilla. IMPRESSION: 1. Suspicious palpable mass in the right breast at 10 o'clock measuring up to 1.8 cm. Note that this mass feels much larger on physical exam  than what is visualized sonographically. 2. Suspicious 0.6 cm intramammary lymph node in the right breast at the 10 o'clock position. 3. At least 3 morphologically abnormal lymph nodes in the right axilla. RECOMMENDATION: 1. Recommend ultrasound-guided biopsy of the mass in the right breast at the 10 o'clock position. 2. Recommend ultrasound-guided biopsy of the intramammary lymph node  in the right breast at the 10 o'clock position. 3. Recommend ultrasound-guided biopsy of 1 of the lymph nodes in the right axilla. I have discussed the findings and recommendations with the patient. If applicable, a reminder letter will be sent to the patient regarding the next appointment. BI-RADS CATEGORY  5: Highly suggestive of malignancy. Electronically Signed   By: Everlean Alstrom M.D.   On: 03/26/2020 09:30   ECHOCARDIOGRAM COMPLETE  Result Date: 04/15/2020    ECHOCARDIOGRAM REPORT   Patient Name:   ALLONA GONDEK Date of Exam: 04/15/2020 Medical Rec #:  [id number]      Height:       69.0 in Accession #:    [id number]     Weight:       219.4 lb Date of Birth:  1965/10/06     BSA:          2.149 m Patient Age:    80 years       BP:           127/73 mmHg Patient Gender: F              HR:           80 bpm. Exam Location:  Outpatient Procedure: 2D Echo, 3D Echo, Color Doppler, Cardiac Doppler and Strain Analysis Indications:    Chemo Z09  History:        Patient has no prior history of Echocardiogram examinations.                 Risk Factors:Hypertension. Breast Cancer.  Sonographer:    Darlina Sicilian RDCS Referring Phys: [place]  1. Left ventricular ejection fraction, by estimation, is 55 to 60%. The left ventricle has normal function. The left ventricle has no regional wall motion abnormalities. Left ventricular diastolic parameters are consistent with Grade I diastolic dysfunction (impaired relaxation).  2. Right ventricular systolic function is mildly reduced. The right ventricular size is mildly  enlarged. There is mildly elevated pulmonary artery systolic pressure.  3. The mitral valve is normal in structure. Trivial mitral valve regurgitation. No evidence of mitral stenosis.  4. The TV is not well seen but is mildly thickened. There is severe tricuspid regurgitation. Consider TEE to more fully evalaute. The tricuspid valve is abnormal. Severe tricuspid stenosis.  5. The aortic valve is normal in structure. There is mild calcification of the aortic valve. Aortic valve regurgitation is not visualized. No aortic stenosis is present.  6. The inferior vena cava is normal in size with greater than 50% respiratory variability, suggesting right atrial pressure of 3 mmHg. FINDINGS  Left Ventricle: Left ventricular ejection fraction, by estimation, is 55 to 60%. The left ventricle has normal function. The left ventricle has no regional wall motion abnormalities. The left ventricular internal cavity size was normal in size. There is  no left ventricular hypertrophy. Left ventricular diastolic parameters are consistent with Grade I diastolic dysfunction (impaired relaxation). Right Ventricle: The right ventricular size is mildly enlarged. No increase in right ventricular wall thickness. Right ventricular systolic function is mildly reduced. There is mildly elevated pulmonary artery systolic pressure. The tricuspid regurgitant  velocity is 3.12 m/s, and with an assumed right atrial pressure of 3 mmHg, the estimated right ventricular systolic pressure is 60.1 mmHg. Left Atrium: Left atrial size was normal in size. Right Atrium: Right atrial size was normal in size. Pericardium: There is no evidence of pericardial effusion. Mitral Valve: The mitral valve is normal in  structure. There is mild thickening of the mitral valve leaflet(s). Trivial mitral valve regurgitation. No evidence of mitral valve stenosis. Tricuspid Valve: The TV is not well seen but is mildly thickened. There is severe tricuspid regurgitation. Consider  TEE to more fully evalaute. The tricuspid valve is abnormal. Tricuspid valve regurgitation is not demonstrated. Severe tricuspid stenosis. Aortic Valve: The aortic valve is normal in structure. There is mild calcification of the aortic valve. Aortic valve regurgitation is not visualized. No aortic stenosis is present. Pulmonic Valve: The pulmonic valve was normal in structure. Pulmonic valve regurgitation is trivial. No evidence of pulmonic stenosis. Aorta: The aortic root is normal in size and structure. Venous: The inferior vena cava is normal in size with greater than 50% respiratory variability, suggesting right atrial pressure of 3 mmHg. IAS/Shunts: No atrial level shunt detected by color flow Doppler.  LEFT VENTRICLE PLAX 2D LVIDd:         4.70 cm  Diastology LVIDs:         3.30 cm  LV e' medial:    7.83 cm/s LV PW:         1.00 cm  LV E/e' medial:  8.8 LV IVS:        0.90 cm  LV e' lateral:   11.00 cm/s LVOT diam:     1.90 cm  LV E/e' lateral: 6.2 LV SV:         58 LV SV Index:   27 LVOT Area:     2.84 cm  RIGHT VENTRICLE RV S prime:     13.60 cm/s TAPSE (M-mode): 2.4 cm LEFT ATRIUM             Index       RIGHT ATRIUM           Index LA diam:        3.60 cm 1.68 cm/m  RA Area:     15.30 cm LA Vol (A2C):   40.5 ml 18.85 ml/m RA Volume:   37.60 ml  17.50 ml/m LA Vol (A4C):   42.5 ml 19.78 ml/m LA Biplane Vol: 41.3 ml 19.22 ml/m  AORTIC VALVE LVOT Vmax:   102.00 cm/s LVOT Vmean:  65.800 cm/s LVOT VTI:    0.203 m  AORTA Ao Root diam: 2.70 cm Ao Asc diam:  2.60 cm MITRAL VALVE               TRICUSPID VALVE MV Area (PHT): 5.27 cm    TR Peak grad:   38.9 mmHg MV Decel Time: 144 msec    TR Vmax:        312.00 cm/s MV E velocity: 68.70 cm/s MV A velocity: 80.60 cm/s  SHUNTS MV E/A ratio:  0.85        Systemic VTI:  0.20 m                            Systemic Diam: 1.90 cm Glori Bickers MD Electronically signed by Glori Bickers MD Signature Date/Time: 04/15/2020/4:12:20 PM    Final    [place] AXILLARY NODE  CORE BIOPSY RIGHT  Addendum Date: 04/02/2020   ADDENDUM REPORT: 04/01/2020 12:43 ADDENDUM: Pathology revealed METASTATIC MAMMARY CARCINOMA INVOLVING A LYMPH NODE of the RIGHT axilla. This was found to be concordant by Dr. Audie Pinto. Pathology results were discussed with the patient by telephone. The patient reported doing well after the biopsy with tenderness at the site. Post biopsy instructions and care  were reviewed and questions were answered. The patient was encouraged to call [facility] for any additional concerns. The patient has a recent diagnosis of RIGHT breast cancer and should follow her outlined treatment plan. Pathology results reported by Stacie Acres RN on 04/01/2020. Electronically Signed   By: Audie Pinto M.D.   On: 04/01/2020 12:43   Addendum Date: 04/01/2020   ADDENDUM REPORT: 04/01/2020 12:43 ADDENDUM: Pathology revealed METASTATIC MAMMARY CARCINOMA INVOLVING A LYMPH NODE of the RIGHT axilla. This was found to be concordant by Dr. Audie Pinto. Pathology results were discussed with the patient by telephone. The patient reported doing well after the biopsy with tenderness at the site. Post biopsy instructions and care were reviewed and questions were answered. The patient was encouraged to call [facility] for any additional concerns. The patient has a recent diagnosis of RIGHT breast cancer and should follow her outlined treatment plan. Pathology results reported by Stacie Acres RN on 04/01/2020. Electronically Signed   By: Audie Pinto M.D.   On: 04/01/2020 12:43   Result Date: 04/01/2020 CLINICAL DATA:  55 year old female presenting for biopsy of a right axillary lymph node. EXAM: [place] AXILLARY NODE CORE BIOPSY RIGHT COMPARISON:  Previous exam(s). PROCEDURE: I met with the patient and we discussed the procedure of ultrasound-guided biopsy, including benefits and alternatives. We discussed the high likelihood of a  successful procedure. We discussed the risks of the procedure, including infection, bleeding, tissue injury, clip migration, and inadequate sampling. Informed written consent was given. The usual time-out protocol was performed immediately prior to the procedure. Using sterile technique and 1% Lidocaine as local anesthetic, under direct ultrasound visualization, a 14 gauge spring-loaded device was used to perform biopsy of a right axillary lymph node using a lateral approach. At the conclusion of the procedure a heart tissue marker clip was deployed into the biopsy cavity. Follow up 2 view mammogram was performed and dictated separately. IMPRESSION: Ultrasound guided biopsy of a right axillary lymph node. No apparent complications. Electronically Signed: By: Audie Pinto M.D. On: 03/31/2020 15:15   MM CLIP PLACEMENT RIGHT  Result Date: 03/31/2020 CLINICAL DATA:  Post procedure mammogram for clip placement. Patient had two right breast biopsies performed on January 13 but a post clip mammogram was not performed at that time due to patient bleeding. EXAM: DIAGNOSTIC RIGHT MAMMOGRAM POST ULTRASOUND BIOPSY COMPARISON:  Previous exam(s). FINDINGS: Mammographic images were obtained following ultrasound guided biopsy of a right breast mass at 10 o'clock 5 cm from the nipple. The ribbon biopsy marking clip is in expected position at the site of biopsy. Mammographic images were obtained following ultrasound guided biopsy of a right breast mass at 10 o'clock 7 cm from the nipple. The coil biopsy marking clip is in expected position at the site of biopsy. Mammographic images were obtained following ultrasound guided biopsy of a right axillary lymph node. The heart biopsy marking clip is in expected position at the site of biopsy. IMPRESSION: Appropriate positioning of the ribbon shaped biopsy marking clip at the site of biopsy in the right breast at 10 o'clock 5 cm from nipple. Appropriate positioning of the coil  shaped biopsy marking clip at the site of biopsy in the right breast at 10 o'clock 7 cm from nipple. Appropriate positioning of the heart shaped biopsy marking clip at the site of biopsy in the right axilla. Final Assessment: Post Procedure Mammograms for Marker Placement Electronically Signed   By: Audie Pinto  M.D.   On: 03/31/2020 15:15   [place] RT BREAST BX W LOC DEV 1ST LESION IMG BX SPEC US GUIDE  Addendum Date: 04/02/2020   ADDENDUM REPORT: 04/01/2020 12:14 ADDENDUM: Pathology revealed GRADE II INVASIVE MAMMARY CARCINOMA. MAMMARY CARCINOMA IN SITU of the RIGHT breast, 10 o'clock, 5 cmfn. E-cadherin is positive supporting a ductal origin; however, please note that there is a lobular growth pattern. This was found to be concordant by Dr. Kristopher Oppenheim. Pathology revealed MAMMARY CARCINOMA INVOLVING LYMPH NODE AND PERINODAL SOFT TISSUE of the RIGHT breast, 10 o'clock, 7 cmfn. This was found to be concordant by Dr. Kristopher Oppenheim. Pathology results were discussed with the patient by telephone. The patient reported doing well after the biopsies with tenderness and minimal bleeding at the sites. Post biopsy instructions and care were reviewed and questions were answered. The patient was encouraged to call [facility] [place] [facility] for any additional concerns. Due to bleeding after second biopsy, patient instructed to return for clip films and RIGHT axillary lymph node biopsy on 03/31/2020 at 2:45 PM. The patient was referred to [facility] on April 08, 2020. Pathology results reported by Stacie Acres RN on 04/01/2020. Electronically Signed   By: Kristopher Oppenheim M.D.   On: 04/01/2020 12:14   Addendum Date: 04/01/2020   ADDENDUM REPORT: 04/01/2020 12:14 ADDENDUM: Pathology revealed GRADE II INVASIVE MAMMARY CARCINOMA. MAMMARY CARCINOMA IN SITU of the RIGHT breast, 10 o'clock, 5 cmfn. E-cadherin is positive supporting a  ductal origin; however, please note that there is a lobular growth pattern. This was found to be concordant by Dr. Kristopher Oppenheim. Pathology revealed MAMMARY CARCINOMA INVOLVING LYMPH NODE AND PERINODAL SOFT TISSUE of the RIGHT breast, 10 o'clock, 7 cmfn. This was found to be concordant by Dr. Kristopher Oppenheim. Pathology results were discussed with the patient by telephone. The patient reported doing well after the biopsies with tenderness and minimal bleeding at the sites. Post biopsy instructions and care were reviewed and questions were answered. The patient was encouraged to call [facility] for any additional concerns. Due to bleeding after second biopsy, patient instructed to return for clip films and RIGHT axillary lymph node biopsy on 03/31/2020 at 2:45 PM. The patient was referred to [facility] on April 08, 2020. Pathology results reported by Stacie Acres RN on 04/01/2020. Electronically Signed   By: Kristopher Oppenheim M.D.   On: 04/01/2020 12:14   Result Date: 04/01/2020 CLINICAL DATA:  55 year old female with suspicious right breast masses and right axillary lymph node. EXAM: ULTRASOUND GUIDED RIGHT BREAST CORE NEEDLE BIOPSY COMPARISON:  Previous exam(s). PROCEDURE: I met with the patient and we discussed the procedure of ultrasound-guided biopsy, including benefits and alternatives. We discussed the high likelihood of a successful procedure. We discussed the risks of the procedure, including infection, bleeding, tissue injury, clip migration, and inadequate sampling. Informed written consent was given. The usual time-out protocol was performed immediately prior to the procedure. Lesion quadrant: Upper outer quadrant Using sterile technique and 1% Lidocaine as local anesthetic, under direct ultrasound visualization, a 12 gauge spring-loaded device was used to perform biopsy of an irregular mass at the 10  o'clock position 5 cm from the nipple using a lateral approach. At the conclusion of the procedure a ribbon shaped tissue marker clip was deployed into the biopsy cavity. Lesion quadrant: Upper outer quadrant Using sterile  technique and 1% Lidocaine as local anesthetic, under direct ultrasound visualization, a 12 gauge spring-loaded device was used to perform biopsy of of a mass at the 10 o'clock position 7 cm from the nipple using a lateral approach. The patient began having significant bleeding after a single core sample was taken. Firm pressure was held for approximately 15 minutes and the bleeding subsided. Subsequently, a coil shaped post biopsy clip was placed. At this time the patient began rebleeding significantly. Additional pressure was held for 10 minutes and a "quick clot" applied. At this point the bleeding subsided. However, follow-up mammogram and additional biopsy of a right axillary lymph node was deferred at this time. An appointment will be arranged for the patient to return for this. IMPRESSION: 1. Ultrasound guided biopsy of the right breast x2. This was complicated by significant bleeding after a single pass was obtained through the mass at the 10 o'clock position 7 cm from the nipple. Firm pressure was applied for multiple intervals and a quick clot and pressure dressing were placed. The bleeding eventually subsided and the patient was discharged in stable condition. She was given instructions if the bleeding returns. 2. Additional biopsy of the right axilla and post biopsy clip films were deferred at this time. An appointment will be made for the patient to return soon to complete this. Electronically Signed: By: Kristopher Oppenheim M.D. On: 03/27/2020 12:32   [place] RT BREAST BX W LOC DEV EA ADD LESION IMG BX SPEC US GUIDE  Addendum Date: 04/02/2020   ADDENDUM REPORT: 04/01/2020 12:14 ADDENDUM: Pathology revealed GRADE II INVASIVE MAMMARY CARCINOMA. MAMMARY CARCINOMA IN SITU of the RIGHT breast,  10 o'clock, 5 cmfn. E-cadherin is positive supporting a ductal origin; however, please note that there is a lobular growth pattern. This was found to be concordant by Dr. Kristopher Oppenheim. Pathology revealed MAMMARY CARCINOMA INVOLVING LYMPH NODE AND PERINODAL SOFT TISSUE of the RIGHT breast, 10 o'clock, 7 cmfn. This was found to be concordant by Dr. Kristopher Oppenheim. Pathology results were discussed with the patient by telephone. The patient reported doing well after the biopsies with tenderness and minimal bleeding at the sites. Post biopsy instructions and care were reviewed and questions were answered. The patient was encouraged to call [facility] for any additional concerns. Due to bleeding after second biopsy, patient instructed to return for clip films and RIGHT axillary lymph node biopsy on 03/31/2020 at 2:45 PM. The patient was referred to [facility] at [facility] on April 08, 2020. Pathology results reported by Stacie Acres RN on 04/01/2020. Electronically Signed   By: Kristopher Oppenheim M.D.   On: 04/01/2020 12:14   Addendum Date: 04/01/2020   ADDENDUM REPORT: 04/01/2020 12:14 ADDENDUM: Pathology revealed GRADE II INVASIVE MAMMARY CARCINOMA. MAMMARY CARCINOMA IN SITU of the RIGHT breast, 10 o'clock, 5 cmfn. E-cadherin is positive supporting a ductal origin; however, please note that there is a lobular growth pattern. This was found to be concordant by Dr. Kristopher Oppenheim. Pathology revealed MAMMARY CARCINOMA INVOLVING LYMPH NODE AND PERINODAL SOFT TISSUE of the RIGHT breast, 10 o'clock, 7 cmfn. This was found to be concordant by Dr. Kristopher Oppenheim. Pathology results were discussed with the patient by telephone. The patient reported doing well after the biopsies with tenderness and minimal bleeding at the sites. Post biopsy instructions and care were reviewed and questions were answered. The patient was encouraged to call The  [facility]  for any additional concerns. Due to bleeding after second biopsy, patient instructed to return for clip films and RIGHT axillary lymph node biopsy on 03/31/2020 at 2:45 PM. The patient was referred to [facility] on April 08, 2020. Pathology results reported by Stacie Acres RN on 04/01/2020. Electronically Signed   By: Kristopher Oppenheim M.D.   On: 04/01/2020 12:14   Result Date: 04/01/2020 CLINICAL DATA:  55 year old female with suspicious right breast masses and right axillary lymph node. EXAM: ULTRASOUND GUIDED RIGHT BREAST CORE NEEDLE BIOPSY COMPARISON:  Previous exam(s). PROCEDURE: I met with the patient and we discussed the procedure of ultrasound-guided biopsy, including benefits and alternatives. We discussed the high likelihood of a successful procedure. We discussed the risks of the procedure, including infection, bleeding, tissue injury, clip migration, and inadequate sampling. Informed written consent was given. The usual time-out protocol was performed immediately prior to the procedure. Lesion quadrant: Upper outer quadrant Using sterile technique and 1% Lidocaine as local anesthetic, under direct ultrasound visualization, a 12 gauge spring-loaded device was used to perform biopsy of an irregular mass at the 10 o'clock position 5 cm from the nipple using a lateral approach. At the conclusion of the procedure a ribbon shaped tissue marker clip was deployed into the biopsy cavity. Lesion quadrant: Upper outer quadrant Using sterile technique and 1% Lidocaine as local anesthetic, under direct ultrasound visualization, a 12 gauge spring-loaded device was used to perform biopsy of of a mass at the 10 o'clock position 7 cm from the nipple using a lateral approach. The patient began having significant bleeding after a single core sample was taken. Firm pressure was held for approximately 15 minutes and  the bleeding subsided. Subsequently, a coil shaped post biopsy clip was placed. At this time the patient began rebleeding significantly. Additional pressure was held for 10 minutes and a "quick clot" applied. At this point the bleeding subsided. However, follow-up mammogram and additional biopsy of a right axillary lymph node was deferred at this time. An appointment will be arranged for the patient to return for this. IMPRESSION: 1. Ultrasound guided biopsy of the right breast x2. This was complicated by significant bleeding after a single pass was obtained through the mass at the 10 o'clock position 7 cm from the nipple. Firm pressure was applied for multiple intervals and a quick clot and pressure dressing were placed. The bleeding eventually subsided and the patient was discharged in stable condition. She was given instructions if the bleeding returns. 2. Additional biopsy of the right axilla and post biopsy clip films were deferred at this time. An appointment will be made for the patient to return soon to complete this. Electronically Signed: By: Kristopher Oppenheim M.D. On: 03/27/2020 12:32     ELIGIBLE FOR AVAILABLE RESEARCH PROTOCOL: no  ASSESSMENT: 55 y.o. [place] woman status post right breast upper outer quadrant biopsy 03/27/2020 for a clinical T1c N3 M1, stageIV invasive ductal carcinoma, functionally triple negative, with an MIB-1 of 30-40%  (a) chest CT scan 04/20/2020 shows in addition to the breast mass and regional adenopathy, lytic and sclerotic bone changes, possible left thoracic adenopathy, and indeterminant left pleural nodularity  (b) bone scan 04/21/2020 confirms multiple bone lesions  (c) thoracic MRI  (1) genetics testing pending  (2) neoadjuvant chemotherapy will consist of pembrolizumab every 3 weeks, carboplatin every 3 weeks x 4 with paclitaxel weekly x12, then possibly followed by doxorubicin and cyclophosphamide every 3 weeks x 4  (  3) zoledronate to start 05/08/2018,  repeated every 12 weeks  (4) definitive surgery to follow  (5) adjuvant radiation   PLAN: I discussed the results of the CT of the chest and bone scan with [facility].  She understands that stage IV breast cancer is not curable with our current knowledge base. The goal of treatment is control.  At the same time, especially in cases where there is predominantly bone metastatic disease, survival may be prolonged and good local control is crucial to quality of life.  For that reason I think starting with carboplatin, paclitaxel and pembrolizumab as originally planned is still the best plan.  We may however choose to not follow-up with the Cytoxan and doxorubicin portions of her treatment.  Instead we will obtain a repeat MRI of the breast after the Taxol/carbo portion and make a decision then regarding further chemotherapy versus continuing on pembrolizumab alone.  We discussed the possible toxicities side effects and complications of the agents she is about to receive and she understands the risk of pneumonitis and other unusual complications from the pembrolizumab, and concerns regarding peripheral neuropathy from the paclitaxel.  She is already scheduled for her first treatment 04/24/2020 and she will have her port placed the day before.  She understands that this will be left accessed and I reassured her regarding that.  We reviewed her supportive medications which she already has on hand.  She will then see [place] on the day 8 treatment of cycle 1 and I asked her to keep a diary of symptoms so we can troubleshoot problems for the next cycles.  Because of the spinal involvement with soft tissue extending into the central canal at T11 I have set her up for an MRI of the spine.  At present though she has no symptoms suggestive of cord compression.   Encounter time 40 minutes.Sarajane Jews C. Sherleen Pangborn, MD 04/21/2020 6:30 PM Medical Oncology and Hematology [facility] [place],  [address] Tel. [phone]    Fax. [phone]   This document serves as a record of services personally performed by Lurline Del, MD. It was created on his behalf by Wilburn Mylar, a trained medical scribe. The creation of this record is based on the scribe's personal observations and the provider's statements to them.   I, Lurline Del MD, have reviewed the above documentation for accuracy and completeness, and I agree with the above.   *Total Encounter Time as defined by the Centers for Medicare and Medicaid Services includes, in addition to the face-to-face time of a patient visit (documented in the note above) non-face-to-face time: obtaining and reviewing outside history, ordering and reviewing medications, tests or procedures, care coordination (communications with other health care professionals or caregivers) and documentation in the medical record.

## 2020-04-21 ENCOUNTER — Other Ambulatory Visit: Payer: Self-pay

## 2020-04-21 ENCOUNTER — Encounter: Payer: Self-pay | Admitting: Oncology

## 2020-04-21 ENCOUNTER — Ambulatory Visit: Payer: BC Managed Care – PPO | Admitting: Oncology

## 2020-04-21 ENCOUNTER — Encounter: Payer: Self-pay | Admitting: *Deleted

## 2020-04-21 ENCOUNTER — Inpatient Hospital Stay: Payer: BC Managed Care – PPO | Admitting: Oncology

## 2020-04-21 VITALS — BP 142/71 | HR 93 | Temp 97.7°F | Resp 18 | Ht 69.0 in | Wt 217.2 lb

## 2020-04-21 DIAGNOSIS — Z79899 Other long term (current) drug therapy: Secondary | ICD-10-CM | POA: Diagnosis not present

## 2020-04-21 DIAGNOSIS — Z5112 Encounter for antineoplastic immunotherapy: Secondary | ICD-10-CM | POA: Diagnosis present

## 2020-04-21 DIAGNOSIS — Z170 Estrogen receptor positive status [ER+]: Secondary | ICD-10-CM | POA: Diagnosis not present

## 2020-04-21 DIAGNOSIS — C50411 Malignant neoplasm of upper-outer quadrant of right female breast: Secondary | ICD-10-CM | POA: Diagnosis present

## 2020-04-21 DIAGNOSIS — Z90710 Acquired absence of both cervix and uterus: Secondary | ICD-10-CM | POA: Diagnosis not present

## 2020-04-21 DIAGNOSIS — I10 Essential (primary) hypertension: Secondary | ICD-10-CM | POA: Diagnosis not present

## 2020-04-21 DIAGNOSIS — Z801 Family history of malignant neoplasm of trachea, bronchus and lung: Secondary | ICD-10-CM | POA: Diagnosis not present

## 2020-04-21 DIAGNOSIS — Z5189 Encounter for other specified aftercare: Secondary | ICD-10-CM | POA: Diagnosis not present

## 2020-04-21 DIAGNOSIS — Z171 Estrogen receptor negative status [ER-]: Secondary | ICD-10-CM | POA: Diagnosis not present

## 2020-04-21 DIAGNOSIS — Z87891 Personal history of nicotine dependence: Secondary | ICD-10-CM | POA: Diagnosis not present

## 2020-04-21 DIAGNOSIS — C7951 Secondary malignant neoplasm of bone: Secondary | ICD-10-CM | POA: Diagnosis not present

## 2020-04-21 DIAGNOSIS — Z7982 Long term (current) use of aspirin: Secondary | ICD-10-CM | POA: Diagnosis not present

## 2020-04-21 DIAGNOSIS — Z8042 Family history of malignant neoplasm of prostate: Secondary | ICD-10-CM | POA: Diagnosis not present

## 2020-04-21 DIAGNOSIS — Z5111 Encounter for antineoplastic chemotherapy: Secondary | ICD-10-CM | POA: Diagnosis present

## 2020-04-21 DIAGNOSIS — Z803 Family history of malignant neoplasm of breast: Secondary | ICD-10-CM | POA: Diagnosis not present

## 2020-04-21 NOTE — Progress Notes (Signed)
Met with patient at registration to introduce myself as Arboriculturist and to offer available resources.  Discussed one-time $1000 Radio broadcast assistant to assist with personal expenses while going through treatment. Also discussed possible available copay assistance for treatment drugs. She verbalized understanding   She has my card if interested in applying and for any additional financial questions or concerns.

## 2020-04-22 ENCOUNTER — Telehealth: Payer: Self-pay | Admitting: Oncology

## 2020-04-22 ENCOUNTER — Encounter (HOSPITAL_BASED_OUTPATIENT_CLINIC_OR_DEPARTMENT_OTHER): Payer: Self-pay | Admitting: General Surgery

## 2020-04-22 ENCOUNTER — Encounter: Payer: Self-pay | Admitting: Oncology

## 2020-04-22 ENCOUNTER — Other Ambulatory Visit: Payer: Self-pay

## 2020-04-22 ENCOUNTER — Telehealth: Payer: Self-pay | Admitting: *Deleted

## 2020-04-22 NOTE — Telephone Encounter (Signed)
Called pt & clarified that she would receive Adriamycin & Cytoxan after 4 cycles of Pembrolizumab, Paclitaxel & Carboplatin.  Discussed possible side effects of N/V, diarrhea/constipation, bone marrow suppression, red urine, hair loss, bladder irritation, cardiac effects, fertility effects, but not limited to these.  Pt expressed understanding & information on these drugs will be left at front desk for pt to p/u on Friday.

## 2020-04-22 NOTE — Telephone Encounter (Signed)
Scheduled appts per 2/8 los. Pt to get updated appt calendar at next visit per appt notes.

## 2020-04-22 NOTE — Progress Notes (Signed)
Left physician form for copay assistance for Keytruda through [employer] with RN to have provider sign and return to me.  I have patient forms for her to sign at next visit so that I can fax completed application to [employer] for processing.

## 2020-04-23 ENCOUNTER — Ambulatory Visit (HOSPITAL_BASED_OUTPATIENT_CLINIC_OR_DEPARTMENT_OTHER)
Admission: RE | Admit: 2020-04-23 | Discharge: 2020-04-23 | Disposition: A | Discharge status: Home / Self Care | Payer: BC Managed Care – PPO | Attending: General Surgery | Admitting: General Surgery

## 2020-04-23 ENCOUNTER — Encounter (HOSPITAL_BASED_OUTPATIENT_CLINIC_OR_DEPARTMENT_OTHER)
Admission: RE | Disposition: A | Discharge status: Home / Self Care | Payer: Self-pay | Source: Home / Self Care | Attending: General Surgery

## 2020-04-23 ENCOUNTER — Ambulatory Visit (HOSPITAL_BASED_OUTPATIENT_CLINIC_OR_DEPARTMENT_OTHER): Payer: BC Managed Care – PPO | Admitting: Anesthesiology

## 2020-04-23 ENCOUNTER — Ambulatory Visit (HOSPITAL_COMMUNITY): Payer: BC Managed Care – PPO

## 2020-04-23 ENCOUNTER — Encounter (HOSPITAL_BASED_OUTPATIENT_CLINIC_OR_DEPARTMENT_OTHER): Payer: Self-pay | Admitting: General Surgery

## 2020-04-23 ENCOUNTER — Encounter: Payer: Self-pay | Admitting: *Deleted

## 2020-04-23 ENCOUNTER — Other Ambulatory Visit (HOSPITAL_COMMUNITY): Payer: BC Managed Care – PPO

## 2020-04-23 ENCOUNTER — Other Ambulatory Visit: Payer: Self-pay

## 2020-04-23 DIAGNOSIS — Z87891 Personal history of nicotine dependence: Secondary | ICD-10-CM | POA: Diagnosis not present

## 2020-04-23 DIAGNOSIS — Z8249 Family history of ischemic heart disease and other diseases of the circulatory system: Secondary | ICD-10-CM | POA: Diagnosis not present

## 2020-04-23 DIAGNOSIS — Z419 Encounter for procedure for purposes other than remedying health state, unspecified: Secondary | ICD-10-CM

## 2020-04-23 DIAGNOSIS — D0511 Intraductal carcinoma in situ of right breast: Secondary | ICD-10-CM | POA: Insufficient documentation

## 2020-04-23 DIAGNOSIS — Z803 Family history of malignant neoplasm of breast: Secondary | ICD-10-CM | POA: Insufficient documentation

## 2020-04-23 DIAGNOSIS — C50919 Malignant neoplasm of unspecified site of unspecified female breast: Secondary | ICD-10-CM | POA: Diagnosis present

## 2020-04-23 DIAGNOSIS — Z809 Family history of malignant neoplasm, unspecified: Secondary | ICD-10-CM | POA: Diagnosis not present

## 2020-04-23 DIAGNOSIS — Z833 Family history of diabetes mellitus: Secondary | ICD-10-CM | POA: Insufficient documentation

## 2020-04-23 HISTORY — PX: PORTACATH PLACEMENT: SHX2246

## 2020-04-23 SURGERY — INSERTION, TUNNELED CENTRAL VENOUS DEVICE, WITH PORT
Anesthesia: General | Site: Breast | Laterality: Left

## 2020-04-23 MED ORDER — HEPARIN SOD (PORK) LOCK FLUSH 100 UNIT/ML IV SOLN
INTRAVENOUS | Status: AC
  Filled 2020-04-23: qty 5

## 2020-04-23 MED ORDER — HEPARIN (PORCINE) IN NACL 1000-0.9 UT/500ML-% IV SOLN
INTRAVENOUS | Status: AC
  Filled 2020-04-23: qty 500

## 2020-04-23 MED ORDER — OXYCODONE HCL 5 MG PO TABS
5.0000 mg | ORAL_TABLET | Freq: Once | ORAL | Status: AC | PRN
  Administered 2020-04-23: 5 mg via ORAL

## 2020-04-23 MED ORDER — MIDAZOLAM HCL 2 MG/2ML IJ SOLN
INTRAMUSCULAR | Status: AC
  Filled 2020-04-23: qty 2

## 2020-04-23 MED ORDER — CEFAZOLIN SODIUM-DEXTROSE 2-4 GM/100ML-% IV SOLN
INTRAVENOUS | Status: AC
  Filled 2020-04-23: qty 100

## 2020-04-23 MED ORDER — AMISULPRIDE (ANTIEMETIC) 5 MG/2ML IV SOLN: 10.0000 mg | Freq: Once | INTRAVENOUS | Status: DC | PRN

## 2020-04-23 MED ORDER — HYDROMORPHONE HCL 1 MG/ML IJ SOLN: 0.2500 mg | INTRAMUSCULAR | Status: DC | PRN

## 2020-04-23 MED ORDER — BUPIVACAINE HCL (PF) 0.25 % IJ SOLN
INTRAMUSCULAR | Status: DC | PRN
  Administered 2020-04-23: 8 mL

## 2020-04-23 MED ORDER — ACETAMINOPHEN 500 MG PO TABS
1000.0000 mg | ORAL_TABLET | ORAL | Status: AC
  Administered 2020-04-23: 1000 mg via ORAL

## 2020-04-23 MED ORDER — MIDAZOLAM HCL 5 MG/5ML IJ SOLN
INTRAMUSCULAR | Status: DC | PRN
  Administered 2020-04-23: 2 mg via INTRAVENOUS

## 2020-04-23 MED ORDER — OXYCODONE HCL 5 MG PO TABS
ORAL_TABLET | ORAL | Status: AC
  Filled 2020-04-23: qty 1

## 2020-04-23 MED ORDER — LIDOCAINE HCL (CARDIAC) PF 100 MG/5ML IV SOSY
PREFILLED_SYRINGE | INTRAVENOUS | Status: DC | PRN
  Administered 2020-04-23: 100 mg via INTRAVENOUS

## 2020-04-23 MED ORDER — FENTANYL CITRATE (PF) 100 MCG/2ML IJ SOLN
INTRAMUSCULAR | Status: AC
  Filled 2020-04-23: qty 2

## 2020-04-23 MED ORDER — HEPARIN SOD (PORK) LOCK FLUSH 100 UNIT/ML IV SOLN
INTRAVENOUS | Status: DC | PRN
  Administered 2020-04-23: 500 [IU]

## 2020-04-23 MED ORDER — TRAMADOL HCL 50 MG PO TABS
100.0000 mg | ORAL_TABLET | Freq: Four times a day (QID) | ORAL | 0 refills | Status: DC | PRN
Start: 2020-04-23 — End: 2020-08-21

## 2020-04-23 MED ORDER — OXYCODONE HCL 5 MG/5ML PO SOLN: 5.0000 mg | Freq: Once | ORAL | Status: AC | PRN

## 2020-04-23 MED ORDER — ONDANSETRON HCL 4 MG/2ML IJ SOLN
INTRAMUSCULAR | Status: DC | PRN
  Administered 2020-04-23: 4 mg via INTRAVENOUS

## 2020-04-23 MED ORDER — ACETAMINOPHEN 500 MG PO TABS
ORAL_TABLET | ORAL | Status: AC
  Filled 2020-04-23: qty 2

## 2020-04-23 MED ORDER — CEFAZOLIN SODIUM-DEXTROSE 2-4 GM/100ML-% IV SOLN
2.0000 g | INTRAVENOUS | Status: AC
  Administered 2020-04-23: 2 g via INTRAVENOUS

## 2020-04-23 MED ORDER — ENSURE PRE-SURGERY PO LIQD: 296.0000 mL | Freq: Once | ORAL | Status: DC

## 2020-04-23 MED ORDER — DEXAMETHASONE SODIUM PHOSPHATE 10 MG/ML IJ SOLN
INTRAMUSCULAR | Status: DC | PRN
  Administered 2020-04-23: 10 mg via INTRAVENOUS

## 2020-04-23 MED ORDER — PROPOFOL 10 MG/ML IV BOLUS
INTRAVENOUS | Status: DC | PRN
  Administered 2020-04-23: 150 mg via INTRAVENOUS
  Administered 2020-04-23: 30 mg via INTRAVENOUS
  Administered 2020-04-23: 20 mg via INTRAVENOUS

## 2020-04-23 MED ORDER — MEPERIDINE HCL 25 MG/ML IJ SOLN: 6.2500 mg | INTRAMUSCULAR | Status: DC | PRN

## 2020-04-23 MED ORDER — FENTANYL CITRATE (PF) 100 MCG/2ML IJ SOLN
INTRAMUSCULAR | Status: DC | PRN
  Administered 2020-04-23: 100 ug via INTRAVENOUS

## 2020-04-23 MED ORDER — HEPARIN (PORCINE) IN NACL 2-0.9 UNITS/ML
INTRAMUSCULAR | Status: AC | PRN
  Administered 2020-04-23: 1

## 2020-04-23 MED ORDER — BUPIVACAINE HCL (PF) 0.25 % IJ SOLN
INTRAMUSCULAR | Status: AC
  Filled 2020-04-23: qty 30

## 2020-04-23 MED ORDER — LACTATED RINGERS IV SOLN: INTRAVENOUS | Status: DC

## 2020-04-23 MED ORDER — PROMETHAZINE HCL 25 MG/ML IJ SOLN: 6.2500 mg | INTRAMUSCULAR | Status: DC | PRN

## 2020-04-23 SURGICAL SUPPLY — 50 items
ADH SKN CLS APL DERMABOND .7 (GAUZE/BANDAGES/DRESSINGS) ×1
APL PRP STRL LF DISP 70% ISPRP (MISCELLANEOUS) ×1
APL SKNCLS STERI-STRIP NONHPOA (GAUZE/BANDAGES/DRESSINGS) ×1
BAG DECANTER FOR FLEXI CONT (MISCELLANEOUS) ×2 IMPLANT
BENZOIN TINCTURE PRP APPL 2/3 (GAUZE/BANDAGES/DRESSINGS) ×2 IMPLANT
BLADE SURG 11 STRL SS (BLADE) ×2 IMPLANT
BLADE SURG 15 STRL LF DISP TIS (BLADE) ×1 IMPLANT
BLADE SURG 15 STRL SS (BLADE) ×2
CANISTER SUCT 1200ML W/VALVE (MISCELLANEOUS) IMPLANT
CHLORAPREP W/TINT 26 (MISCELLANEOUS) ×2 IMPLANT
COVER BACK TABLE 60X90IN (DRAPES) ×2 IMPLANT
COVER MAYO STAND STRL (DRAPES) ×2 IMPLANT
COVER PROBE 5X48 (MISCELLANEOUS) ×2
COVER WAND RF STERILE (DRAPES) IMPLANT
DECANTER SPIKE VIAL GLASS SM (MISCELLANEOUS) IMPLANT
DERMABOND ADVANCED (GAUZE/BANDAGES/DRESSINGS) ×1
DERMABOND ADVANCED .7 DNX12 (GAUZE/BANDAGES/DRESSINGS) ×1 IMPLANT
DRAPE C-ARM 42X72 X-RAY (DRAPES) ×2 IMPLANT
DRAPE LAPAROSCOPIC ABDOMINAL (DRAPES) ×2 IMPLANT
DRAPE UTILITY XL STRL (DRAPES) ×2 IMPLANT
DRSG TEGADERM 4X4.75 (GAUZE/BANDAGES/DRESSINGS) ×4 IMPLANT
ELECT COATED BLADE 2.86 ST (ELECTRODE) ×2 IMPLANT
ELECT REM PT RETURN 9FT ADLT (ELECTROSURGICAL) ×2
ELECTRODE REM PT RTRN 9FT ADLT (ELECTROSURGICAL) ×1 IMPLANT
GAUZE SPONGE 4X4 12PLY STRL LF (GAUZE/BANDAGES/DRESSINGS) ×2 IMPLANT
GLOVE SURG ENC MOIS LTX SZ7 (GLOVE) ×2 IMPLANT
GLOVE SURG UNDER POLY LF SZ7.5 (GLOVE) ×2 IMPLANT
GOWN STRL REUS W/ TWL LRG LVL3 (GOWN DISPOSABLE) ×2 IMPLANT
GOWN STRL REUS W/TWL LRG LVL3 (GOWN DISPOSABLE) ×4
IV KIT MINILOC 20X1 SAFETY (NEEDLE) IMPLANT
KIT CVR 48X5XPRB PLUP LF (MISCELLANEOUS) ×1 IMPLANT
KIT PORT POWER 8FR ISP CVUE (Port) ×2 IMPLANT
NDL SAFETY ECLIPSE 18X1.5 (NEEDLE) IMPLANT
NEEDLE HYPO 18GX1.5 SHARP (NEEDLE)
NEEDLE HYPO 25X1 1.5 SAFETY (NEEDLE) ×2 IMPLANT
PACK BASIN DAY SURGERY FS (CUSTOM PROCEDURE TRAY) ×2 IMPLANT
PENCIL SMOKE EVACUATOR (MISCELLANEOUS) ×2 IMPLANT
SLEEVE SCD COMPRESS KNEE MED (MISCELLANEOUS) ×2 IMPLANT
STRIP CLOSURE SKIN 1/2X4 (GAUZE/BANDAGES/DRESSINGS) ×2 IMPLANT
SUT MNCRL AB 4-0 PS2 18 (SUTURE) ×2 IMPLANT
SUT PROLENE 2 0 SH DA (SUTURE) ×2 IMPLANT
SUT SILK 2 0 TIES 17X18 (SUTURE)
SUT SILK 2-0 18XBRD TIE BLK (SUTURE) IMPLANT
SUT VIC AB 3-0 SH 27 (SUTURE) ×2
SUT VIC AB 3-0 SH 27X BRD (SUTURE) ×1 IMPLANT
SYR 5ML LUER SLIP (SYRINGE) ×2 IMPLANT
SYR CONTROL 10ML LL (SYRINGE) ×2 IMPLANT
TOWEL GREEN STERILE FF (TOWEL DISPOSABLE) ×2 IMPLANT
TUBE CONNECTING 20X1/4 (TUBING) IMPLANT
YANKAUER SUCT BULB TIP NO VENT (SUCTIONS) IMPLANT

## 2020-04-23 NOTE — H&P (Signed)
55 yof who has history of HTN presents with new right breast cancer. she has significant fh. she noted right breast mass in November, no nipple dc noted. she had mm that showed a right breast distortion in uoq. dx views and mm showed a 1.8x1.7x1.7 cm mass with a 6 mm IM node as well. there are at least 3 abnormal ax nodes present. biopsy of all 3 were done. the clips from breast and im node are 3.3 cm apart under compression. the ax node is positive. 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 Ki between 30-40%.  she lives in [place] and works in [employer]. she has no prior breast history.    Past Surgical History Kara Slipper, RN; 04/08/2020 8:17 AM) Cesarean Section - Multiple  Hysterectomy (not due to cancer) - Partial  Knee Surgery  Left.  Diagnostic Studies History Kara Slipper, RN; 04/08/2020 8:17 AM) Mammogram  within last year Pap Smear  1-5 years ago  Medication History Kara Slipper, RN; 04/08/2020 8:16 AM) Medications Reconciled  Social History Kara Slipper, RN; 04/08/2020 8:17 AM) Alcohol use  Occasional alcohol use. Caffeine use  Carbonated beverages, Coffee, Tea. No drug use  Tobacco use  Former smoker.  Family History Kara Slipper, RN; 04/08/2020 8:17 AM) Arthritis  Mother. Breast Cancer  Family Members In General, Mother. Cancer  Family Members In General. Diabetes Mellitus  Father. Heart Disease  Family Members In General, Father, Mother. Hypertension  Mother.  Pregnancy / Birth History Kara Slipper, RN; 04/08/2020 8:17 AM) Age at menarche  55 years. Age of menopause  <45 Contraceptive History  Oral contraceptives. Gravida  2 Irregular periods  Maternal age  41-20 Para  2  Other Problems Kara Slipper, RN; 04/08/2020 8:17 AM) Arthritis  High blood pressure     Review of Systems Kara Slipper RN; 04/08/2020 8:17 AM) General Present- Night Sweats. Not Present- Appetite Loss, Chills,  Fatigue, Fever, Weight Gain and Weight Loss. HEENT Present- Wears glasses/contact lenses. Not Present- Earache, Hearing Loss, Hoarseness, Nose Bleed, Oral Ulcers, Ringing in the Ears, Seasonal Allergies, Sinus Pain, Sore Throat, Visual Disturbances and Yellow Eyes. Breast Present- Breast Mass and Breast Pain. Not Present- Nipple Discharge and Skin Changes. Gastrointestinal Not Present- Abdominal Pain, Bloating, Bloody Stool, Change in Bowel Habits, Chronic diarrhea, Constipation, Difficulty Swallowing, Excessive gas, Gets full quickly at meals, Hemorrhoids, Indigestion, Nausea, Rectal Pain and Vomiting. Female Genitourinary Not Present- Frequency, Nocturia, Painful Urination, Pelvic Pain and Urgency. Musculoskeletal Not Present- Back Pain, Joint Pain, Joint Stiffness, Muscle Pain, Muscle Weakness and Swelling of Extremities. Neurological Not Present- Decreased Memory, Fainting, Headaches, Numbness, Seizures, Tingling, Tremor, Trouble walking and Weakness. Psychiatric Not Present- Anxiety, Bipolar, Change in Sleep Pattern, Depression, Fearful and Frequent crying. Endocrine Present- Hot flashes. Not Present- Cold Intolerance, Excessive Hunger, Hair Changes, Heat Intolerance and New Diabetes. Hematology Present- Blood Thinners and Easy Bruising. Not Present- Excessive bleeding, Gland problems, HIV and Persistent Infections.   Physical Exam Rolm Bookbinder MD; 04/08/2020 2:04 PM) General Mental Status-Alert. Orientation-Oriented X3.  Breast Nipples-No Discharge. Note: right uoq mass with surrounding hematoma measuring 3 cm no left breast mass   Lymphatic Head & Neck  General Head & Neck Lymphatics: Bilateral - Description - Normal. Axillary  General Axillary Region: Bilateral - Description - Normal. Note: no Tecumseh adenopathy     Assessment & Plan Rolm Bookbinder MD; 04/08/2020 2:07 PM) BREAST CANCER OF UPPER-OUTER QUADRANT OF RIGHT FEMALE BREAST (C50.411)  Story: Genetic  testing, MRI, port, staging We discussed the staging and pathophysiology of breast cancer. We discussed all of the different options for treatment for breast cancer including surgery, chemotherapy, radiation therapy, Herceptin, and antiestrogen therapy. We discussed rationale for chemo first with prognostic information. We discussed possibly less surgery including a TAD once chemot completed We discussed mastectomy and lumpectomy as options once chemo is done and would not make that decision until chemo is done. for now will plan for port placement which I discussed will plan to do soon so we can start chemotherapy.

## 2020-04-23 NOTE — Transfer of Care (Signed)
Immediate Anesthesia Transfer of Care Note  Patient: [name]  Procedure(s) Performed: LEFT SIDE PORT PLACEMENT WITH ULTRASOUND GUIDANCE (Left Breast)  Patient Location: PACU  Anesthesia Type:General  Level of Consciousness: awake  Airway & Oxygen Therapy: Patient Spontanous Breathing and Patient connected to face mask oxygen  Post-op Assessment: Report given to RN and Post -op Vital signs reviewed and stable  Post vital signs: Reviewed and stable  Last Vitals:  Vitals Value Taken Time  BP 145/90 04/23/20 1523  Temp    Pulse 85 04/23/20 1524  Resp 18 04/23/20 1524  SpO2 100 % 04/23/20 1524  Vitals shown include unvalidated device data.  Last Pain:  Vitals:   04/23/20 1253  TempSrc: Oral  PainSc: 0-No pain         Complications: No complications documented.

## 2020-04-23 NOTE — Discharge Instructions (Signed)
PORT-A-CATH: POST OP INSTRUCTIONS  Always review your discharge instruction sheet given to you by the facility where your surgery was performed.   1. A prescription for pain medication may be given to you upon discharge. Take your pain medication as prescribed, if needed. If narcotic pain medicine is not needed, then you make take acetaminophen (Tylenol) or ibuprofen (Advil) as needed.  2. Take your usually prescribed medications unless otherwise directed. 3. If you need a refill on your pain medication, please contact our office. All narcotic pain medicine now requires a paper prescription.  Phoned in and fax refills are no longer allowed by law.  Prescriptions will not be filled after 5 pm or on weekends.  4. You should follow a light diet for the remainder of the day after your procedure. 5. Most patients will experience some mild swelling and/or bruising in the area of the incision. It may take several days to resolve. 6. It is common to experience some constipation if taking pain medication after surgery. Increasing fluid intake and taking a stool softener (such as Colace) will usually help or prevent this problem from occurring. A mild laxative (Milk of Magnesia or Miralax) should be taken according to package directions if there are no bowel movements after 48 hours.  7. Unless discharge instructions indicate otherwise, you may remove your bandages 48 hours after surgery, and you may shower at that time. You may have steri-strips (small white skin tapes) in place directly over the incision.  These strips should be left on the skin for 7-10 days.  If your surgeon used Dermabond (skin glue) on the incision, you may shower in 24 hours.  The glue will flake off over the next 2-3 weeks.  8. If your port is left accessed at the end of surgery (needle left in port), the dressing cannot get wet and should only by changed by a healthcare professional. When the port is no longer accessed (when the  needle has been removed), follow step 7.   9. ACTIVITIES:  Limit activity involving your arms for the next 72 hours. Do no strenuous exercise or activity for 1 week. You may drive when you are no longer taking prescription pain medication, you can comfortably wear a seatbelt, and you can maneuver your car. 10.You may need to see your doctor in the office for a follow-up appointment.  Please       check with your doctor.  11.When you receive a new Port-a-Cath, you will get a product guide and        ID card.  Please keep them in case you need them.  WHEN TO CALL YOUR DOCTOR [phone]): 1. Fever over 101.0 2. Chills 3. Continued bleeding from incision 4. Increased redness and tenderness at the site 5. Shortness of breath, difficulty breathing   The clinic staff is available to answer your questions during regular business hours. Please dont hesitate to call and ask to speak to one of the nurses or medical assistants for clinical concerns. If you have a medical emergency, go to the nearest emergency room or call 911.  A surgeon from [facility] is always on call at the hospital.     For further information, please visit [email or web address]   Post Anesthesia Home Care Instructions  Activity: Get plenty of rest for the remainder of the day. A responsible individual must stay with you for 24 hours following the procedure.  For the next 24 hours, DO NOT: -Drive a car -  Operate machinery -Drink alcoholic beverages -Take any medication unless instructed by your physician -Make any legal decisions or sign important papers.  Meals: Start with liquid foods such as gelatin or soup. Progress to regular foods as tolerated. Avoid greasy, spicy, heavy foods. If nausea and/or vomiting occur, drink only clear liquids until the nausea and/or vomiting subsides. Call your physician if vomiting continues.  Special Instructions/Symptoms: Your throat may feel dry or sore from  the anesthesia or the breathing tube placed in your throat during surgery. If this causes discomfort, gargle with warm salt water. The discomfort should disappear within 24 hours.  If you had a scopolamine patch placed behind your ear for the management of post- operative nausea and/or vomiting:  1. The medication in the patch is effective for 72 hours, after which it should be removed.  Wrap patch in a tissue and discard in the trash. Wash hands thoroughly with soap and water. 2. You may remove the patch earlier than 72 hours if you experience unpleasant side effects which may include dry mouth, dizziness or visual disturbances. 3. Avoid touching the patch. Wash your hands with soap and water after contact with the patch.    No Tylenol until 7:00 PM. Check with your nurses at the cancer center about taking your aspiring. Otherwise check with your regular doctor.

## 2020-04-23 NOTE — Anesthesia Preprocedure Evaluation (Signed)
Anesthesia Evaluation  Patient identified by MRN, date of birth, ID band Patient awake    Reviewed: Allergy & Precautions, H&P , NPO status , Patient's Chart, lab work & pertinent test results  Airway Mallampati: II  TM Distance: >3 FB Neck ROM: Full    Dental no notable dental hx.    Pulmonary neg pulmonary ROS, former smoker,    Pulmonary exam normal breath sounds clear to auscultation       Cardiovascular hypertension, negative cardio ROS Normal cardiovascular exam Rhythm:Regular Rate:Normal     Neuro/Psych negative neurological ROS  negative psych ROS   GI/Hepatic negative GI ROS, Neg liver ROS,   Endo/Other  negative endocrine ROS  Renal/GU negative Renal ROS  negative genitourinary   Musculoskeletal  (+) Arthritis , Osteoarthritis,    Abdominal (+) + obese,   Peds negative pediatric ROS (+)  Hematology negative hematology ROS (+)   Anesthesia Other Findings Breast Cancer  Reproductive/Obstetrics negative OB ROS                             Anesthesia Physical Anesthesia Plan  ASA: III  Anesthesia Plan: General   Post-op Pain Management:    Induction: Intravenous  PONV Risk Score and Plan: 3 and Ondansetron, Dexamethasone and Midazolam  Airway Management Planned: LMA  Additional Equipment:   Intra-op Plan:   Post-operative Plan: Extubation in OR  Informed Consent: I have reviewed the patients History and Physical, chart, labs and discussed the procedure including the risks, benefits and alternatives for the proposed anesthesia with the patient or authorized representative who has indicated his/her understanding and acceptance.     Dental advisory given  Plan Discussed with: CRNA  Anesthesia Plan Comments:         Anesthesia Quick Evaluation

## 2020-04-23 NOTE — Anesthesia Postprocedure Evaluation (Signed)
Anesthesia Post Note  Patient: [name]  Procedure(s) Performed: LEFT SIDE PORT PLACEMENT WITH ULTRASOUND GUIDANCE (Left Breast)     Patient location during evaluation: Phase II Anesthesia Type: General Level of consciousness: awake and sedated Pain management: pain level controlled Vital Signs Assessment: post-procedure vital signs reviewed and stable Respiratory status: spontaneous breathing Cardiovascular status: stable Postop Assessment: no apparent nausea or vomiting Anesthetic complications: no   No complications documented.  Last Vitals:  Vitals:   04/23/20 1545 04/23/20 1558  BP: 133/83 (!) 143/82  Pulse: 72 74  Resp: 13 16  Temp:  36.8 C  SpO2: 99% 100%    Last Pain:  Vitals:   04/23/20 1530  TempSrc:   PainSc: Dike Jr

## 2020-04-23 NOTE — Anesthesia Procedure Notes (Signed)
Procedure Name: LMA Insertion Date/Time: 04/23/2020 2:36 PM Performed by: Ezequiel Kayser, CRNA Pre-anesthesia Checklist: Patient identified, Emergency Drugs available, Suction available and Patient being monitored Patient Re-evaluated:Patient Re-evaluated prior to induction Oxygen Delivery Method: Circle System Utilized Preoxygenation: Pre-oxygenation with 100% oxygen Induction Type: IV induction Ventilation: Mask ventilation without difficulty LMA: LMA inserted LMA Size: 4.0 Number of attempts: 1 Airway Equipment and Method: Bite block Placement Confirmation: positive ETCO2 Tube secured with: Tape Dental Injury: Teeth and Oropharynx as per pre-operative assessment

## 2020-04-23 NOTE — Op Note (Signed)
Preoperative diagnosis stage IV breast cancer Postoperative diagnosis: Same as above Procedure: Left internal jugular port placement with ultrasound guidance Surgeon: Dr. Serita Grammes Anesthesia: General  Estimated blood loss:minimal Specimens:none Sponge and needlecount was correct atcompletion Drains: None Disposition recovery stable condition  Indications:54 yof with stage IV breast cancer due to begin chemotherapy tomorrow. Discussed port placement.   Procedure: After informed consent was obtainedshe was taken to the OR.She was given antibiotics. SCDs were placed. She was placed under general anesthesia without complication. She was prepped and draped in the standard sterile surgical fashion. A surgical timeout was then performed.  I used the ultrasound to identify the left internal jugular vein. Under ultrasound guidance I then accessed the vein with the needle. I passed the wire. The wire was in the vein both by ultrasound and by fluoroscopy. I thenmade an incisionon her left chest. I tunneled the line between the 2 sites. I then placed the dilator over the wire. I observed this with fluoroscopy to go in the correct position. I then removedthe wire. I then passed the line. The peel-away sheath was removed. I pulled the line back to be in the superior vena cava. I then attached the port. I sutured this into place with 2-0 Prolene. I then closed this with 3-0 Vicryl and 4-0 Monocryl. Glue was placed. Final fluoroscopic image showed the port to be in good position. I then accessed the port and was able to aspirate blood and packed this with heparin.

## 2020-04-24 ENCOUNTER — Inpatient Hospital Stay: Payer: BC Managed Care – PPO

## 2020-04-24 ENCOUNTER — Encounter: Payer: Self-pay | Admitting: *Deleted

## 2020-04-24 ENCOUNTER — Encounter (HOSPITAL_BASED_OUTPATIENT_CLINIC_OR_DEPARTMENT_OTHER): Payer: Self-pay | Admitting: General Surgery

## 2020-04-24 ENCOUNTER — Telehealth: Payer: Self-pay | Admitting: *Deleted

## 2020-04-24 ENCOUNTER — Other Ambulatory Visit: Payer: Self-pay

## 2020-04-24 VITALS — BP 145/74 | HR 70 | Temp 98.0°F | Resp 18

## 2020-04-24 DIAGNOSIS — C50411 Malignant neoplasm of upper-outer quadrant of right female breast: Secondary | ICD-10-CM

## 2020-04-24 DIAGNOSIS — Z170 Estrogen receptor positive status [ER+]: Secondary | ICD-10-CM

## 2020-04-24 DIAGNOSIS — Z1379 Encounter for other screening for genetic and chromosomal anomalies: Secondary | ICD-10-CM | POA: Insufficient documentation

## 2020-04-24 DIAGNOSIS — Z95828 Presence of other vascular implants and grafts: Secondary | ICD-10-CM

## 2020-04-24 LAB — CMP (CANCER CENTER ONLY)
ALT: 11 U/L (ref 0–44)
AST: 14 U/L — ABNORMAL LOW (ref 15–41)
Albumin: 3.8 g/dL (ref 3.5–5.0)
Alkaline Phosphatase: 114 U/L (ref 38–126)
Anion gap: 7 (ref 5–15)
BUN: 13 mg/dL (ref 6–20)
CO2: 25 mmol/L (ref 22–32)
Calcium: 10.2 mg/dL (ref 8.9–10.3)
Chloride: 107 mmol/L (ref 98–111)
Creatinine: 0.78 mg/dL (ref 0.44–1.00)
GFR, Estimated: >60 mL/min (ref >60)
Glucose, Bld: 136 mg/dL — ABNORMAL HIGH (ref 70–99)
Potassium: 3.4 mmol/L — ABNORMAL LOW (ref 3.5–5.1)
Sodium: 139 mmol/L (ref 135–145)
Total Bilirubin: 0.3 mg/dL (ref 0.3–1.2)
Total Protein: 7.9 g/dL (ref 6.5–8.1)

## 2020-04-24 LAB — CBC WITH DIFFERENTIAL (CANCER CENTER ONLY)
Abs Immature Granulocytes: 0.03 10*3/uL (ref 0.00–0.07)
Basophils Absolute: 0 10*3/uL (ref 0.0–0.1)
Basophils Relative: 0 %
Eosinophils Absolute: 0 10*3/uL (ref 0.0–0.5)
Eosinophils Relative: 0 %
HCT: 36.1 % (ref 36.0–46.0)
Hemoglobin: 11.5 g/dL — ABNORMAL LOW (ref 12.0–15.0)
Immature Granulocytes: 0 %
Lymphocytes Relative: 14 %
Lymphs Abs: 1.4 10*3/uL (ref 0.7–4.0)
MCH: 28.2 pg (ref 26.0–34.0)
MCHC: 31.9 g/dL (ref 30.0–36.0)
MCV: 88.5 fL (ref 80.0–100.0)
Monocytes Absolute: 0.7 10*3/uL (ref 0.1–1.0)
Monocytes Relative: 7 %
Neutro Abs: 7.8 10*3/uL — ABNORMAL HIGH (ref 1.7–7.7)
Neutrophils Relative %: 79 %
Platelet Count: 369 10*3/uL (ref 150–400)
RBC: 4.08 MIL/uL (ref 3.87–5.11)
RDW: 12.1 % (ref 11.5–15.5)
WBC Count: 10 10*3/uL (ref 4.0–10.5)
nRBC: 0 % (ref 0.0–0.2)

## 2020-04-24 LAB — TSH: TSH: 0.525 u[IU]/mL (ref 0.308–3.960)

## 2020-04-24 MED ORDER — DIPHENHYDRAMINE HCL 50 MG/ML IJ SOLN
25.0000 mg | Freq: Once | INTRAMUSCULAR | Status: AC
  Administered 2020-04-24: 25 mg via INTRAVENOUS

## 2020-04-24 MED ORDER — DIPHENHYDRAMINE HCL 50 MG/ML IJ SOLN
INTRAMUSCULAR | Status: AC
  Filled 2020-04-24: qty 1

## 2020-04-24 MED ORDER — SODIUM CHLORIDE 0.9 % IV SOLN
Freq: Once | INTRAVENOUS | Status: AC
  Filled 2020-04-24: qty 250

## 2020-04-24 MED ORDER — SODIUM CHLORIDE 0.9 % IV SOLN
80.0000 mg/m2 | Freq: Once | INTRAVENOUS | Status: AC
  Administered 2020-04-24: 174 mg via INTRAVENOUS
  Filled 2020-04-24: qty 29

## 2020-04-24 MED ORDER — FAMOTIDINE IN NACL 20-0.9 MG/50ML-% IV SOLN
INTRAVENOUS | Status: AC
  Filled 2020-04-24: qty 50

## 2020-04-24 MED ORDER — HEPARIN SOD (PORK) LOCK FLUSH 100 UNIT/ML IV SOLN
500.0000 [IU] | Freq: Once | INTRAVENOUS | Status: AC | PRN
  Administered 2020-04-24: 500 [IU]
  Filled 2020-04-24: qty 5

## 2020-04-24 MED ORDER — PALONOSETRON HCL INJECTION 0.25 MG/5ML
0.2500 mg | Freq: Once | INTRAVENOUS | Status: AC
  Administered 2020-04-24: 0.25 mg via INTRAVENOUS

## 2020-04-24 MED ORDER — SODIUM CHLORIDE 0.9% FLUSH
10.0000 mL | INTRAVENOUS | Status: DC | PRN
  Administered 2020-04-24: 10 mL via INTRAVENOUS
  Filled 2020-04-24: qty 10

## 2020-04-24 MED ORDER — FAMOTIDINE IN NACL 20-0.9 MG/50ML-% IV SOLN
20.0000 mg | Freq: Once | INTRAVENOUS | Status: AC
  Administered 2020-04-24: 20 mg via INTRAVENOUS

## 2020-04-24 MED ORDER — SODIUM CHLORIDE 0.9 % IV SOLN
200.0000 mg | Freq: Once | INTRAVENOUS | Status: AC
  Administered 2020-04-24: 200 mg via INTRAVENOUS
  Filled 2020-04-24: qty 8

## 2020-04-24 MED ORDER — SODIUM CHLORIDE 0.9 % IV SOLN
10.0000 mg | Freq: Once | INTRAVENOUS | Status: AC
  Administered 2020-04-24: 10 mg via INTRAVENOUS
  Filled 2020-04-24: qty 10

## 2020-04-24 MED ORDER — PALONOSETRON HCL INJECTION 0.25 MG/5ML
INTRAVENOUS | Status: AC
  Filled 2020-04-24: qty 5

## 2020-04-24 MED ORDER — SODIUM CHLORIDE 0.9% FLUSH
10.0000 mL | INTRAVENOUS | Status: DC | PRN
  Administered 2020-04-24: 10 mL
  Filled 2020-04-24: qty 10

## 2020-04-24 MED ORDER — SODIUM CHLORIDE 0.9 % IV SOLN
150.0000 mg | Freq: Once | INTRAVENOUS | Status: AC
  Administered 2020-04-24: 150 mg via INTRAVENOUS
  Filled 2020-04-24: qty 150

## 2020-04-24 MED ORDER — SODIUM CHLORIDE 0.9 % IV SOLN
750.0000 mg | Freq: Once | INTRAVENOUS | Status: AC
Start: 2020-04-24 — End: 2020-04-24
  Administered 2020-04-24: 750 mg via INTRAVENOUS
  Filled 2020-04-24: qty 75

## 2020-04-24 NOTE — Patient Instructions (Signed)
Implanted Port Insertion, Care After This sheet gives you information about how to care for yourself after your procedure. Your health care provider may also give you more specific instructions. If you have problems or questions, contact your health care provider. What can I expect after the procedure? After the procedure, it is common to have:  Discomfort at the port insertion site.  Bruising on the skin over the port. This should improve over 3-4 days. Follow these instructions at home: Port care  After your port is placed, you will get a manufacturer's information card. The card has information about your port. Keep this card with you at all times.  Take care of the port as told by your health care provider. Ask your health care provider if you or a family member can get training for taking care of the port at home. A home health care nurse may also take care of the port.  Make sure to remember what type of port you have. Incision care  Follow instructions from your health care provider about how to take care of your port insertion site. Make sure you: ? Wash your hands with soap and water before and after you change your bandage (dressing). If soap and water are not available, use hand sanitizer. ? Change your dressing as told by your health care provider. ? Leave stitches (sutures), skin glue, or adhesive strips in place. These skin closures may need to stay in place for 2 weeks or longer. If adhesive strip edges start to loosen and curl up, you may trim the loose edges. Do not remove adhesive strips completely unless your health care provider tells you to do that.  Check your port insertion site every day for signs of infection. Check for: ? Redness, swelling, or pain. ? Fluid or blood. ? Warmth. ? Pus or a bad smell.      Activity  Return to your normal activities as told by your health care provider. Ask your health care provider what activities are safe for you.  Do not  lift anything that is heavier than 10 lb (4.5 kg), or the limit that you are told, until your health care provider says that it is safe. General instructions  Take over-the-counter and prescription medicines only as told by your health care provider.  Do not take baths, swim, or use a hot tub until your health care provider approves. Ask your health care provider if you may take showers. You may only be allowed to take sponge baths.  Do not drive for 24 hours if you were given a sedative during your procedure.  Wear a medical alert bracelet in case of an emergency. This will tell any health care providers that you have a port.  Keep all follow-up visits as told by your health care provider. This is important. Contact a health care provider if:  You cannot flush your port with saline as directed, or you cannot draw blood from the port.  You have a fever or chills.  You have redness, swelling, or pain around your port insertion site.  You have fluid or blood coming from your port insertion site.  Your port insertion site feels warm to the touch.  You have pus or a bad smell coming from the port insertion site. Get help right away if:  You have chest pain or shortness of breath.  You have bleeding from your port that you cannot control. Summary  Take care of the port as told by your   health care provider. Keep the manufacturer's information card with you at all times.  Change your dressing as told by your health care provider.  Contact a health care provider if you have a fever or chills or if you have redness, swelling, or pain around your port insertion site.  Keep all follow-up visits as told by your health care provider. This information is not intended to replace advice given to you by your health care provider. Make sure you discuss any questions you have with your health care provider. Document Revised: 09/26/2017 Document Reviewed: 09/26/2017 Elsevier Patient Education   2021 [employer] Inc[employer]  

## 2020-04-24 NOTE — Telephone Encounter (Signed)
LM with note below. To call if has questions 

## 2020-04-24 NOTE — Telephone Encounter (Signed)
-----   Message from Gardenia Phlegm, NP sent at 04/24/2020  1:36 PM EST ----- Patient's potassium is slightly low, please call her about increasing her potassium rich diet.   ----- Message ----- From: Buel Ream, Lab In [place] Sent: 04/24/2020   8:50 AM EST To: Chauncey Cruel, MD

## 2020-04-24 NOTE — Patient Instructions (Signed)
[facility] Discharge Instructions for Patients Receiving Chemotherapy  Today you received the following chemotherapy agents keytruda; taxol; carboplatin  To help prevent nausea and vomiting after your treatment, we encourage you to take your nausea medication as directed   If you develop nausea and vomiting that is not controlled by your nausea medication, call the clinic.   BELOW ARE SYMPTOMS THAT SHOULD BE REPORTED IMMEDIATELY:  *FEVER GREATER THAN 100.5 F  *CHILLS WITH OR WITHOUT FEVER  NAUSEA AND VOMITING THAT IS NOT CONTROLLED WITH YOUR NAUSEA MEDICATION  *UNUSUAL SHORTNESS OF BREATH  *UNUSUAL BRUISING OR BLEEDING  TENDERNESS IN MOUTH AND THROAT WITH OR WITHOUT PRESENCE OF ULCERS  *URINARY PROBLEMS  *BOWEL PROBLEMS  UNUSUAL RASH Items with * indicate a potential emergency and should be followed up as soon as possible.  Feel free to call the clinic should you have any questions or concerns. The clinic phone number is [phone].  Please show the [facility] at check-in to the Emergency Department and triage nurse.  Pembrolizumab injection What is this medicine? PEMBROLIZUMAB (pem broe liz ue mab) is a monoclonal antibody. It is used to treat certain types of cancer. This medicine may be used for other purposes; ask your health care provider or pharmacist if you have questions. COMMON BRAND NAME(S): Keytruda What should I tell my health care provider before I take this medicine? They need to know if you have any of these conditions:  autoimmune diseases like Crohn's disease, ulcerative colitis, or lupus  have had or planning to have an allogeneic stem cell transplant (uses someone else's stem cells)  history of organ transplant  history of chest radiation  nervous system problems like myasthenia gravis or Guillain-Barre syndrome  an unusual or allergic reaction to pembrolizumab, other medicines, foods, dyes, or preservatives  pregnant  or trying to get pregnant  breast-feeding How should I use this medicine? This medicine is for infusion into a vein. It is given by a health care professional in a hospital or clinic setting. A special MedGuide will be given to you before each treatment. Be sure to read this information carefully each time. Talk to your pediatrician regarding the use of this medicine in children. While this drug may be prescribed for children as young as 6 months for selected conditions, precautions do apply. Overdosage: If you think you have taken too much of this medicine contact a poison control center or emergency room at once. NOTE: This medicine is only for you. Do not share this medicine with others. What if I miss a dose? It is important not to miss your dose. Call your doctor or health care professional if you are unable to keep an appointment. What may interact with this medicine? Interactions have not been studied. This list may not describe all possible interactions. Give your health care provider a list of all the medicines, herbs, non-prescription drugs, or dietary supplements you use. Also tell them if you smoke, drink alcohol, or use illegal drugs. Some items may interact with your medicine. What should I watch for while using this medicine? Your condition will be monitored carefully while you are receiving this medicine. You may need blood work done while you are taking this medicine. Do not become pregnant while taking this medicine or for 4 months after stopping it. Women should inform their doctor if they wish to become pregnant or think they might be pregnant. There is a potential for serious side effects to an unborn child.  Talk to your health care professional or pharmacist for more information. Do not breast-feed an infant while taking this medicine or for 4 months after the last dose. What side effects may I notice from receiving this medicine? Side effects that you should report to your  doctor or health care professional as soon as possible:  allergic reactions like skin rash, itching or hives, swelling of the face, lips, or tongue  bloody or black, tarry  breathing problems  changes in vision  chest pain  chills  confusion  constipation  cough  diarrhea  dizziness or feeling faint or lightheaded  fast or irregular heartbeat  fever  flushing  joint pain  low blood counts - this medicine may decrease the number of white blood cells, red blood cells and platelets. You may be at increased risk for infections and bleeding.  muscle pain  muscle weakness  pain, tingling, numbness in the hands or feet  persistent headache  redness, blistering, peeling or loosening of the skin, including inside the mouth  signs and symptoms of high blood sugar such as dizziness; dry mouth; dry skin; fruity breath; nausea; stomach pain; increased hunger or thirst; increased urination  signs and symptoms of kidney injury like trouble passing urine or change in the amount of urine  signs and symptoms of liver injury like dark urine, light-colored stools, loss of appetite, nausea, right upper belly pain, yellowing of the eyes or skin  sweating  swollen lymph nodes  weight loss Side effects that usually do not require medical attention (report to your doctor or health care professional if they continue or are bothersome):  decreased appetite  hair loss  tiredness This list may not describe all possible side effects. Call your doctor for medical advice about side effects. You may report side effects to FDA at 1-800-FDA-1088. Where should I keep my medicine? This drug is given in a hospital or clinic and will not be stored at home. NOTE: This sheet is a summary. It may not cover all possible information. If you have questions about this medicine, talk to your doctor, pharmacist, or health care provider.  2021 Elsevier/Gold Standard (2019-01-30  21:44:53)   Paclitaxel injection What is this medicine? PACLITAXEL (PAK li TAX el) is a chemotherapy drug. It targets fast dividing cells, like cancer cells, and causes these cells to die. This medicine is used to treat ovarian cancer, breast cancer, lung cancer, Kaposi's sarcoma, and other cancers. This medicine may be used for other purposes; ask your health care provider or pharmacist if you have questions. COMMON BRAND NAME(S): Onxol, Taxol What should I tell my health care provider before I take this medicine? They need to know if you have any of these conditions:  history of irregular heartbeat  liver disease  low blood counts, like low white cell, platelet, or red cell counts  lung or breathing disease, like asthma  tingling of the fingers or toes, or other nerve disorder  an unusual or allergic reaction to paclitaxel, alcohol, polyoxyethylated castor oil, other chemotherapy, other medicines, foods, dyes, or preservatives  pregnant or trying to get pregnant  breast-feeding How should I use this medicine? This drug is given as an infusion into a vein. It is administered in a hospital or clinic by a specially trained health care professional. Talk to your pediatrician regarding the use of this medicine in children. Special care may be needed. Overdosage: If you think you have taken too much of this medicine contact a poison control  center or emergency room at once. NOTE: This medicine is only for you. Do not share this medicine with others. What if I miss a dose? It is important not to miss your dose. Call your doctor or health care professional if you are unable to keep an appointment. What may interact with this medicine? Do not take this medicine with any of the following medications:  live virus vaccines This medicine may also interact with the following medications:  antiviral medicines for hepatitis, HIV or AIDS  certain antibiotics like erythromycin and  clarithromycin  certain medicines for fungal infections like ketoconazole and itraconazole  certain medicines for seizures like carbamazepine, phenobarbital, phenytoin  gemfibrozil  nefazodone  rifampin  St. Raad's wort This list may not describe all possible interactions. Give your health care provider a list of all the medicines, herbs, non-prescription drugs, or dietary supplements you use. Also tell them if you smoke, drink alcohol, or use illegal drugs. Some items may interact with your medicine. What should I watch for while using this medicine? Your condition will be monitored carefully while you are receiving this medicine. You will need important blood work done while you are taking this medicine. This medicine can cause serious allergic reactions. To reduce your risk you will need to take other medicine(s) before treatment with this medicine. If you experience allergic reactions like skin rash, itching or hives, swelling of the face, lips, or tongue, tell your doctor or health care professional right away. In some cases, you may be given additional medicines to help with side effects. Follow all directions for their use. This drug may make you feel generally unwell. This is not uncommon, as chemotherapy can affect healthy cells as well as cancer cells. Report any side effects. Continue your course of treatment even though you feel ill unless your doctor tells you to stop. Call your doctor or health care professional for advice if you get a fever, chills or sore throat, or other symptoms of a cold or flu. Do not treat yourself. This drug decreases your body's ability to fight infections. Try to avoid being around people who are sick. This medicine may increase your risk to bruise or bleed. Call your doctor or health care professional if you notice any unusual bleeding. Be careful brushing and flossing your teeth or using a toothpick because you may get an infection or bleed more easily.  If you have any dental work done, tell your dentist you are receiving this medicine. Avoid taking products that contain aspirin, acetaminophen, ibuprofen, naproxen, or ketoprofen unless instructed by your doctor. These medicines may hide a fever. Do not become pregnant while taking this medicine. Women should inform their doctor if they wish to become pregnant or think they might be pregnant. There is a potential for serious side effects to an unborn child. Talk to your health care professional or pharmacist for more information. Do not breast-feed an infant while taking this medicine. Men are advised not to father a child while receiving this medicine. This product may contain alcohol. Ask your pharmacist or healthcare provider if this medicine contains alcohol. Be sure to tell all healthcare providers you are taking this medicine. Certain medicines, like metronidazole and disulfiram, can cause an unpleasant reaction when taken with alcohol. The reaction includes flushing, headache, nausea, vomiting, sweating, and increased thirst. The reaction can last from 30 minutes to several hours. What side effects may I notice from receiving this medicine? Side effects that you should report to your doctor  or health care professional as soon as possible:  allergic reactions like skin rash, itching or hives, swelling of the face, lips, or tongue  breathing problems  changes in vision  fast, irregular heartbeat  high or low blood pressure  mouth sores  pain, tingling, numbness in the hands or feet  signs of decreased platelets or bleeding - bruising, pinpoint red spots on the skin, black, tarry stools, blood in the urine  signs of decreased red blood cells - unusually weak or tired, feeling faint or lightheaded, falls  signs of infection - fever or chills, cough, sore throat, pain or difficulty passing urine  signs and symptoms of liver injury like dark yellow or brown urine; general ill feeling or  flu-like symptoms; light-colored stools; loss of appetite; nausea; right upper belly pain; unusually weak or tired; yellowing of the eyes or skin  swelling of the ankles, feet, hands  unusually slow heartbeat Side effects that usually do not require medical attention (report to your doctor or health care professional if they continue or are bothersome):  diarrhea  hair loss  loss of appetite  muscle or joint pain  nausea, vomiting  pain, redness, or irritation at site where injected  tiredness This list may not describe all possible side effects. Call your doctor for medical advice about side effects. You may report side effects to FDA at 1-800-FDA-1088. Where should I keep my medicine? This drug is given in a hospital or clinic and will not be stored at home. NOTE: This sheet is a summary. It may not cover all possible information. If you have questions about this medicine, talk to your doctor, pharmacist, or health care provider.  2021 Elsevier/Gold Standard (2019-01-30 13:37:23)  Carboplatin injection What is this medicine? CARBOPLATIN (KAR boe pla tin) is a chemotherapy drug. It targets fast dividing cells, like cancer cells, and causes these cells to die. This medicine is used to treat ovarian cancer and many other cancers. This medicine may be used for other purposes; ask your health care provider or pharmacist if you have questions. COMMON BRAND NAME(S): Paraplatin What should I tell my health care provider before I take this medicine? They need to know if you have any of these conditions:  blood disorders  hearing problems  kidney disease  recent or ongoing radiation therapy  an unusual or allergic reaction to carboplatin, cisplatin, other chemotherapy, other medicines, foods, dyes, or preservatives  pregnant or trying to get pregnant  breast-feeding How should I use this medicine? This drug is usually given as an infusion into a vein. It is administered in a  hospital or clinic by a specially trained health care professional. Talk to your pediatrician regarding the use of this medicine in children. Special care may be needed. Overdosage: If you think you have taken too much of this medicine contact a poison control center or emergency room at once. NOTE: This medicine is only for you. Do not share this medicine with others. What if I miss a dose? It is important not to miss a dose. Call your doctor or health care professional if you are unable to keep an appointment. What may interact with this medicine?  medicines for seizures  medicines to increase blood counts like filgrastim, pegfilgrastim, sargramostim  some antibiotics like amikacin, gentamicin, neomycin, streptomycin, tobramycin  vaccines Talk to your doctor or health care professional before taking any of these medicines:  acetaminophen  aspirin  ibuprofen  ketoprofen  naproxen This list may not describe all  possible interactions. Give your health care provider a list of all the medicines, herbs, non-prescription drugs, or dietary supplements you use. Also tell them if you smoke, drink alcohol, or use illegal drugs. Some items may interact with your medicine. What should I watch for while using this medicine? Your condition will be monitored carefully while you are receiving this medicine. You will need important blood work done while you are taking this medicine. This drug may make you feel generally unwell. This is not uncommon, as chemotherapy can affect healthy cells as well as cancer cells. Report any side effects. Continue your course of treatment even though you feel ill unless your doctor tells you to stop. In some cases, you may be given additional medicines to help with side effects. Follow all directions for their use. Call your doctor or health care professional for advice if you get a fever, chills or sore throat, or other symptoms of a cold or flu. Do not treat  yourself. This drug decreases your body's ability to fight infections. Try to avoid being around people who are sick. This medicine may increase your risk to bruise or bleed. Call your doctor or health care professional if you notice any unusual bleeding. Be careful brushing and flossing your teeth or using a toothpick because you may get an infection or bleed more easily. If you have any dental work done, tell your dentist you are receiving this medicine. Avoid taking products that contain aspirin, acetaminophen, ibuprofen, naproxen, or ketoprofen unless instructed by your doctor. These medicines may hide a fever. Do not become pregnant while taking this medicine. Women should inform their doctor if they wish to become pregnant or think they might be pregnant. There is a potential for serious side effects to an unborn child. Talk to your health care professional or pharmacist for more information. Do not breast-feed an infant while taking this medicine. What side effects may I notice from receiving this medicine? Side effects that you should report to your doctor or health care professional as soon as possible:  allergic reactions like skin rash, itching or hives, swelling of the face, lips, or tongue  signs of infection - fever or chills, cough, sore throat, pain or difficulty passing urine  signs of decreased platelets or bleeding - bruising, pinpoint red spots on the skin, black, tarry stools, nosebleeds  signs of decreased red blood cells - unusually weak or tired, fainting spells, lightheadedness  breathing problems  changes in hearing  changes in vision  chest pain  high blood pressure  low blood counts - This drug may decrease the number of white blood cells, red blood cells and platelets. You may be at increased risk for infections and bleeding.  nausea and vomiting  pain, swelling, redness or irritation at the injection site  pain, tingling, numbness in the hands or  feet  problems with balance, talking, walking  trouble passing urine or change in the amount of urine Side effects that usually do not require medical attention (report to your doctor or health care professional if they continue or are bothersome):  hair loss  loss of appetite  metallic taste in the mouth or changes in taste This list may not describe all possible side effects. Call your doctor for medical advice about side effects. You may report side effects to FDA at 1-800-FDA-1088. Where should I keep my medicine? This drug is given in a hospital or clinic and will not be stored at home. NOTE: This sheet is  a summary. It may not cover all possible information. If you have questions about this medicine, talk to your doctor, pharmacist, or health care provider.  2021 Elsevier/Gold Standard (2007-06-05 14:38:05)

## 2020-04-25 LAB — T4: T4, Total: 8.7 ug/dL (ref 4.5–12.0)

## 2020-04-27 ENCOUNTER — Encounter: Payer: Self-pay | Admitting: *Deleted

## 2020-04-27 ENCOUNTER — Ambulatory Visit: Payer: Self-pay | Admitting: Genetic Counselor

## 2020-04-27 ENCOUNTER — Encounter: Payer: Self-pay | Admitting: Oncology

## 2020-04-27 ENCOUNTER — Telehealth: Payer: Self-pay

## 2020-04-27 ENCOUNTER — Telehealth: Payer: Self-pay | Admitting: *Deleted

## 2020-04-27 ENCOUNTER — Encounter: Payer: Self-pay | Admitting: Genetic Counselor

## 2020-04-27 ENCOUNTER — Telehealth: Payer: Self-pay | Admitting: Genetic Counselor

## 2020-04-27 DIAGNOSIS — Z1379 Encounter for other screening for genetic and chromosomal anomalies: Secondary | ICD-10-CM

## 2020-04-27 NOTE — Telephone Encounter (Signed)
Revealed negative genetic testing. Discussed that we do not know why she has breast cancer or why there is cancer in the family. There could be a genetic mutation in the family that [name] did not inherit. There could also be a mutation in a different gene that we are not testing, or our current technology may not be able to detect certain mutations. It will therefore be important for her to stay in contact with genetics to keep up with whether additional testing may be appropriate in the future.

## 2020-04-27 NOTE — Progress Notes (Signed)
HPI:  Ms. Kiss was previously seen in the [facility] due to a personal and family history of cancer and concerns regarding a hereditary predisposition to cancer. Please refer to our prior cancer genetics clinic note for more information regarding our discussion, assessment and recommendations, at the time. Ms. Bohall recent genetic test results were disclosed to her, as were recommendations warranted by these results. These results and recommendations are discussed in more detail below.  CANCER HISTORY:  Oncology History  Malignant neoplasm of upper-outer quadrant of right breast in female, estrogen receptor positive ([place])  04/03/2020 Initial Diagnosis   Malignant neoplasm of upper-outer quadrant of right breast in female, estrogen receptor positive ([place])   04/08/2020 Cancer Staging   Staging form: Breast, AJCC 8th Edition - Clinical stage from 04/08/2020: Stage IIB (cT1c, cN1(f), cM0, G2, ER-, PR-, HER2-) - Signed by Chauncey Cruel, MD on 04/08/2020   04/24/2020 -  Chemotherapy    Patient is on Treatment Plan: BREAST PEMBROLIZUMAB + CARBOPLATIN D1 + PACLITAXEL D1,8,15 Q21D X 4 CYCLES / PEMBROLIZUMAB + AC Q21D X 4 CYCLES      04/24/2020 Genetic Testing   Negative genetic testing:  No pathogenic variants detected on the [facility]Next-Cancer + RNAinsight panel. The report date is 04/24/2020.   The CustomNext-Cancer+RNAinsight panel offered by [facility] includes sequencing and rearrangement analysis for the following 47 genes:  APC, ATM, AXIN2, BARD1, BMPR1A, BRCA1, BRCA2, BRIP1, CDH1, CDK4, CDKN2A, CHEK2, DICER1, EPCAM, GREM1, HOXB13, MEN1, MLH1, MSH2, MSH3, MSH6, MUTYH, NBN, NF1, NF2, NTHL1, PALB2, PMS2, POLD1, POLE, PTEN, RAD51C, RAD51D, RECQL, RET, SDHA, SDHAF2, SDHB, SDHC, SDHD, SMAD4, SMARCA4, STK11, TP53, TSC1, TSC2, and VHL.  RNA data is routinely analyzed for use in variant interpretation for all genes.     FAMILY HISTORY:  We obtained a detailed,  4-generation family history.  Significant diagnoses are listed below: Family History  Problem Relation Age of Onset  . Breast cancer Mother 95  . Arthritis Mother   . Diabetes Father   . Kidney disease Father   . Hypertension Sister   . Heart disease Maternal Grandmother   . Breast cancer Paternal Grandmother        dx >50  . Heart disease Maternal Aunt   . Breast cancer Maternal Aunt 77  . Other Maternal Aunt        brain tumor (not cancerous)  . Lung cancer Maternal Aunt   . Prostate cancer Cousin 75       localized   Ms. Lamaster has two sons (ages 76 and 36). She has one maternal half-sister (age 33), as well as one paternal half-sister (age 6) and one paternal half-brother (age 41). None of these family members have had cancer.  Ms. Laconte mother is 41 and has a history of breast cancer diagnosed at age 93. Ms. Salyers had one maternal aunt and three maternal uncles. Her aunt had breast cancer diagnosed at age 72, lung cancer diagnosed at an unknown age, and was also diagnosed with a non-cancerous brain tumor. This aunt's son was recently diagnosed with localized prostate cancer at age 53. Ms. Pfeffer's maternal grandparents died older than 54 without cancer.  Ms. Reininger father is 26 and has not had cancer. There are no paternal aunts or uncles. Her paternal grandmother was diagnosed with breast cancer older than 75. Her paternal grandfather died older than 70 without cancer.  Ms. Pearman is unaware of previous family history of genetic testing for hereditary cancer risks. Patient's  ancestors are of [place] and [place] descent, according to [employer]. There is no reported Ashkenazi Jewish ancestry. There is no known consanguinity.  GENETIC TEST RESULTS: Genetic testing reported out on 04/24/2020 through the [facility]Next-Cancer + RNAinsight panel. No pathogenic variants were detected.   The CustomNext-Cancer+RNAinsight panel offered by [facility] includes sequencing and  rearrangement analysis for the following 47 genes:  APC, ATM, AXIN2, BARD1, BMPR1A, BRCA1, BRCA2, BRIP1, CDH1, CDK4, CDKN2A, CHEK2, DICER1, EPCAM, GREM1, HOXB13, MEN1, MLH1, MSH2, MSH3, MSH6, MUTYH, NBN, NF1, NF2, NTHL1, PALB2, PMS2, POLD1, POLE, PTEN, RAD51C, RAD51D, RECQL, RET, SDHA, SDHAF2, SDHB, SDHC, SDHD, SMAD4, SMARCA4, STK11, TP53, TSC1, TSC2, and VHL.  RNA data is routinely analyzed for use in variant interpretation for all genes. The test report will be scanned into EPIC and located under the Molecular Pathology section of the Results Review tab.  A portion of the result report is included below for reference.     We discussed with Ms. Nau that because current genetic testing is not perfect, it is possible there may be a gene mutation in one of these genes that current testing cannot detect, but that chance is small.  We also discussed that there could be another gene that has not yet been discovered, or that we have not yet tested, that is responsible for the cancer diagnoses in the family. It is also possible there is a hereditary cause for the cancer in the family that Ms. Kohen did not inherit and therefore was not identified in her testing.  Therefore, it is important to remain in touch with cancer genetics in the future so that we can continue to offer Ms. Halm the most up to date genetic testing.   CANCER SCREENING RECOMMENDATIONS: Ms. Pecor test result is considered negative (normal).  This means that we have not identified a hereditary cause for her personal and family history of cancer at this time. While reassuring, this does not definitively rule out a hereditary predisposition to cancer. It is still possible that there could be genetic mutations that are undetectable by current technology. There could be genetic mutations in genes that have not been tested or identified to increase cancer risk.  Therefore, it is recommended she continue to follow the cancer management and screening  guidelines provided by her oncology and primary healthcare provider.   An individual's cancer risk and medical management are not determined by genetic test results alone. Overall cancer risk assessment incorporates additional factors, including personal medical history, family history, and any available genetic information that may result in a personalized plan for cancer prevention and surveillance.  RECOMMENDATIONS FOR FAMILY MEMBERS:  Individuals in this family might be at some increased risk of developing cancer, over the general population risk, simply due to the family history of cancer.  We recommended women in this family have a yearly mammogram beginning at age 52, or 73 years younger than the earliest onset of cancer, an annual clinical breast exam, and perform monthly breast self-exams. Women in this family should also have a gynecological exam as recommended by their primary provider. All family members should be referred for colonoscopy starting at age 52.  It is also possible there is a hereditary cause for the cancer in Ms. Erway's family that she did not inherit and therefore was not identified in her.  Based on Ms. Nowaczyk's family history, we recommended her mother, who was diagnosed with breast cancer at age 18, have genetic counseling and testing. Ms. Granville will let us  know if we can be of any assistance in coordinating genetic counseling and/or testing for this family member.   FOLLOW-UP: Lastly, we discussed with Ms. Hodgman that cancer genetics is a rapidly advancing field and it is possible that new genetic tests will be appropriate for her and/or her family members in the future. We encouraged her to remain in contact with cancer genetics on an annual basis so we can update her personal and family histories and let her know of advances in cancer genetics that may benefit this family.   Our contact number was provided. Ms. Rupard questions were answered to her satisfaction, and she  knows she is welcome to call us at anytime with additional questions or concerns.   Clint Guy, MS, [facility] Counsel[facility] [email or web address] Phone: [phone]

## 2020-04-27 NOTE — Telephone Encounter (Signed)
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 Pt verbalized understanding.

## 2020-04-27 NOTE — Progress Notes (Signed)
Patient called regarding Pharmacologist. Advised what is needed to apply.  She will bring on 05/01/20.  She has my card for any additional financial questions or concerns.

## 2020-04-29 ENCOUNTER — Ambulatory Visit (HOSPITAL_COMMUNITY)
Admission: RE | Admit: 2020-04-29 | Discharge: 2020-04-29 | Disposition: A | Discharge status: Home / Self Care | Payer: BC Managed Care – PPO | Source: Ambulatory Visit | Attending: Oncology | Admitting: Oncology

## 2020-04-29 DIAGNOSIS — Z170 Estrogen receptor positive status [ER+]: Secondary | ICD-10-CM | POA: Diagnosis present

## 2020-04-29 DIAGNOSIS — C50411 Malignant neoplasm of upper-outer quadrant of right female breast: Secondary | ICD-10-CM

## 2020-04-29 DIAGNOSIS — C7951 Secondary malignant neoplasm of bone: Secondary | ICD-10-CM

## 2020-04-29 MED ORDER — GADOBUTROL 1 MMOL/ML IV SOLN
10.0000 mL | Freq: Once | INTRAVENOUS | Status: AC | PRN
  Administered 2020-04-29: 10 mL via INTRAVENOUS

## 2020-04-30 ENCOUNTER — Encounter: Payer: Self-pay | Admitting: *Deleted

## 2020-04-30 ENCOUNTER — Other Ambulatory Visit: Payer: Self-pay | Admitting: Oncology

## 2020-05-01 ENCOUNTER — Inpatient Hospital Stay: Payer: BC Managed Care – PPO

## 2020-05-01 ENCOUNTER — Encounter: Payer: Self-pay | Admitting: Adult Health

## 2020-05-01 ENCOUNTER — Inpatient Hospital Stay: Payer: BC Managed Care – PPO | Admitting: Adult Health

## 2020-05-01 ENCOUNTER — Encounter: Payer: Self-pay | Admitting: Oncology

## 2020-05-01 ENCOUNTER — Other Ambulatory Visit: Payer: BC Managed Care – PPO

## 2020-05-01 ENCOUNTER — Encounter: Payer: Self-pay | Admitting: *Deleted

## 2020-05-01 ENCOUNTER — Other Ambulatory Visit: Payer: Self-pay

## 2020-05-01 VITALS — BP 108/66 | HR 103 | Temp 97.7°F | Resp 17 | Ht 69.0 in | Wt 216.9 lb

## 2020-05-01 VITALS — HR 90

## 2020-05-01 DIAGNOSIS — C50411 Malignant neoplasm of upper-outer quadrant of right female breast: Secondary | ICD-10-CM | POA: Diagnosis not present

## 2020-05-01 DIAGNOSIS — C7951 Secondary malignant neoplasm of bone: Secondary | ICD-10-CM

## 2020-05-01 DIAGNOSIS — Z170 Estrogen receptor positive status [ER+]: Secondary | ICD-10-CM

## 2020-05-01 DIAGNOSIS — Z95828 Presence of other vascular implants and grafts: Secondary | ICD-10-CM

## 2020-05-01 LAB — CMP (CANCER CENTER ONLY)
ALT: 17 U/L (ref 0–44)
AST: 22 U/L (ref 15–41)
Albumin: 3.9 g/dL (ref 3.5–5.0)
Alkaline Phosphatase: 118 U/L (ref 38–126)
Anion gap: 10 (ref 5–15)
BUN: 11 mg/dL (ref 6–20)
CO2: 25 mmol/L (ref 22–32)
Calcium: 9.6 mg/dL (ref 8.9–10.3)
Chloride: 102 mmol/L (ref 98–111)
Creatinine: 0.86 mg/dL (ref 0.44–1.00)
GFR, Estimated: >60 mL/min (ref >60)
Glucose, Bld: 118 mg/dL — ABNORMAL HIGH (ref 70–99)
Potassium: 3.7 mmol/L (ref 3.5–5.1)
Sodium: 137 mmol/L (ref 135–145)
Total Bilirubin: 0.4 mg/dL (ref 0.3–1.2)
Total Protein: 7.5 g/dL (ref 6.5–8.1)

## 2020-05-01 LAB — CBC WITH DIFFERENTIAL (CANCER CENTER ONLY)
Abs Immature Granulocytes: 0.01 10*3/uL (ref 0.00–0.07)
Basophils Absolute: 0 10*3/uL (ref 0.0–0.1)
Basophils Relative: 1 %
Eosinophils Absolute: 0.2 10*3/uL (ref 0.0–0.5)
Eosinophils Relative: 4 %
HCT: 31.1 % — ABNORMAL LOW (ref 36.0–46.0)
Hemoglobin: 10 g/dL — ABNORMAL LOW (ref 12.0–15.0)
Immature Granulocytes: 0 %
Lymphocytes Relative: 34 %
Lymphs Abs: 1.4 10*3/uL (ref 0.7–4.0)
MCH: 28 pg (ref 26.0–34.0)
MCHC: 32.2 g/dL (ref 30.0–36.0)
MCV: 87.1 fL (ref 80.0–100.0)
Monocytes Absolute: 0.3 10*3/uL (ref 0.1–1.0)
Monocytes Relative: 8 %
Neutro Abs: 2.3 10*3/uL (ref 1.7–7.7)
Neutrophils Relative %: 53 %
Platelet Count: 262 10*3/uL (ref 150–400)
RBC: 3.57 MIL/uL — ABNORMAL LOW (ref 3.87–5.11)
RDW: 11.9 % (ref 11.5–15.5)
WBC Count: 4.3 10*3/uL (ref 4.0–10.5)
nRBC: 0 % (ref 0.0–0.2)

## 2020-05-01 LAB — TSH: TSH: 0.895 u[IU]/mL (ref 0.308–3.960)

## 2020-05-01 MED ORDER — ZOLEDRONIC ACID 4 MG/100ML IV SOLN
INTRAVENOUS | Status: AC
  Filled 2020-05-01: qty 100

## 2020-05-01 MED ORDER — ZOLEDRONIC ACID 4 MG/100ML IV SOLN: 4.0000 mg | Freq: Once | INTRAVENOUS | Status: DC

## 2020-05-01 MED ORDER — FAMOTIDINE IN NACL 20-0.9 MG/50ML-% IV SOLN
20.0000 mg | Freq: Once | INTRAVENOUS | Status: AC
  Administered 2020-05-01: 20 mg via INTRAVENOUS

## 2020-05-01 MED ORDER — DIPHENHYDRAMINE HCL 50 MG/ML IJ SOLN
INTRAMUSCULAR | Status: AC
  Filled 2020-05-01: qty 1

## 2020-05-01 MED ORDER — SODIUM CHLORIDE 0.9% FLUSH
10.0000 mL | INTRAVENOUS | Status: DC | PRN
  Administered 2020-05-01: 10 mL
  Filled 2020-05-01: qty 10

## 2020-05-01 MED ORDER — SODIUM CHLORIDE 0.9 % IV SOLN
10.0000 mg | Freq: Once | INTRAVENOUS | Status: AC
  Administered 2020-05-01: 10 mg via INTRAVENOUS
  Filled 2020-05-01: qty 10

## 2020-05-01 MED ORDER — SODIUM CHLORIDE 0.9% FLUSH
10.0000 mL | Freq: Once | INTRAVENOUS | Status: AC
  Administered 2020-05-01: 10 mL via INTRAVENOUS
  Filled 2020-05-01: qty 10

## 2020-05-01 MED ORDER — HEPARIN SOD (PORK) LOCK FLUSH 100 UNIT/ML IV SOLN
500.0000 [IU] | Freq: Once | INTRAVENOUS | Status: AC | PRN
  Administered 2020-05-01: 500 [IU]
  Filled 2020-05-01: qty 5

## 2020-05-01 MED ORDER — DIPHENHYDRAMINE HCL 50 MG/ML IJ SOLN
25.0000 mg | Freq: Once | INTRAMUSCULAR | Status: AC
  Administered 2020-05-01: 25 mg via INTRAVENOUS

## 2020-05-01 MED ORDER — SODIUM CHLORIDE 0.9 % IV SOLN
80.0000 mg/m2 | Freq: Once | INTRAVENOUS | Status: AC
  Administered 2020-05-01: 174 mg via INTRAVENOUS
  Filled 2020-05-01: qty 29

## 2020-05-01 MED ORDER — FAMOTIDINE IN NACL 20-0.9 MG/50ML-% IV SOLN
INTRAVENOUS | Status: AC
  Filled 2020-05-01: qty 50

## 2020-05-01 MED ORDER — SODIUM CHLORIDE 0.9 % IV SOLN
Freq: Once | INTRAVENOUS | Status: AC
  Filled 2020-05-01: qty 250

## 2020-05-01 NOTE — Progress Notes (Signed)
Obtained information from RN for [place] for [employer].  Faxed completed form to [employer]. Fax received ok per confirmation sheet.

## 2020-05-01 NOTE — Progress Notes (Signed)
[name]  Telephone:(336) [phone] Fax:(336) [phone]     ID: [name] DOB: 1965-08-05  MR#: [id number]  YTK#:[id number]  Patient Care Team: [name], MD as PCP - General (Family Medicine) Mauro Kaufmann, RN as Oncology Nurse Navigator Rockwell Germany, RN as Oncology Nurse Navigator Rolm Bookbinder, MD as Consulting Physician (General Surgery) Magrinat, Virgie Dad, MD as Consulting Physician (Oncology) Kyung Rudd, MD as Consulting Physician (Radiation Oncology) Cheri Fowler, MD as Consulting Physician (Obstetrics and Gynecology) Elsie Saas, MD as Consulting Physician (Orthopedic Surgery) Juanita Craver, MD as Consulting Physician (Gastroenterology) Scot Dock, NP OTHER MD:  CHIEF COMPLAINT: Triple negative breast cancer  CURRENT TREATMENT: Neoadjuvant chemotherapy   INTERVAL HISTORY: [name] returns today for follow up and treatment of her 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  She is receiving treatment with Paclitaxel, Carboplatin, and Pembrolizumab.  She receives the Pembrolizumab and Carboplatin on day 1 of a 21 day cycle, and Paclitaxel on days 1, 8, and 15.  She is here for cycle 1 day 8 of treatment.     REVIEW OF SYSTEMS: [name] notes that she tolerated her treatment quite well.  She has continued to work.  She has had maybe some mild fatigue, and occasional pain/difficulty with the mobility of her right arm.  She has noted that she has been accommodated at work to change what she is doing so she isn't moving her arm as much.    [name] is still adjusting to the port and it is working well.  She denies nausea, vomiting, fever,chills, cough, chest pain, palpitations, shortness of breath, bowel/bladder changes, peripheral neuropathy concerns.    She did not do the cryotherapy last week and was unaware that she needed to ask the nurse to help her with this.  Otherwise a detailed ROS was non contributory.      COVID 19 VACCINATION  STATUS: fully vaccinated Therapist, music), with booster   HISTORY OF CURRENT ILLNESS: From the original intake note:  [name] had routine screening mammography showing a possible abnormality in the right breast and a prominent intramammary lymph node. She underwent right diagnostic mammography with tomography and right breast ultrasonography at [facility] on 03/26/2020 showing: breast density category B; palpable 1.8 cm right breast mass at 10 o'clock, which feels much larger than what is visualized sonographically; suspicious 0.6 cm intramammary lymph node in right breast at 10 o'clock; at least 3 morphologically abnormal lymph nodes in right axilla.  Accordingly on 03/27/2020 she proceeded to biopsy of the right breast areas in question. The pathology from this procedure ([id number]) showed:  1. Right Breast, 10 o'clock (mass)  - invasive mammary carcinoma, e-cadherin positive with a lobular growth pattern, grade 2  - mammary carcinoma in situ  - Prognostic indicators significant for: estrogen receptor, 1% positive and progesterone receptor, 1% positive, both with strong staining intensity. Proliferation marker Ki67 at 30%. HER2 negative by immunohistochemistry (0). 2. Right Breast, 10 o'clock (intramammary node)  - mammary carcinoma involving lymph node and perinodal soft tissue  -  Prognostic indicators significant for: estrogen receptor, 1% positive with strong staining intensity and progesterone receptor, 0% negative. Proliferation marker Ki67 at 40%. HER2 negative by immunohistochemistry (0).  Cancer Staging Malignant neoplasm of upper-outer quadrant of right breast in female, estrogen receptor positive ([place]) Staging form: Breast, AJCC 8th Edition - Clinical stage from 04/08/2020: Stage IIB (cT1c, cN1(f), cM0, G2, ER-, PR-, HER2-) - Signed by Chauncey Cruel, MD on 04/08/2020 Stage  prefix: Initial diagnosis Method of lymph node assessment: Core biopsy  The patient's subsequent  history is as detailed below.   PAST MEDICAL HISTORY: Past Medical History:  Diagnosis Date  . Allergy   . Arthritis 2018   hands, left knee  . Breast cancer ([place]) 03-27-2020   right breast IMC  . Eczema   . Family history of breast cancer   . Family history of lung cancer   . Family history of prostate cancer   . Hypertension     PAST SURGICAL HISTORY: Past Surgical History:  Procedure Laterality Date  . ABDOMINAL HYSTERECTOMY    . CESAREAN SECTION    . KNEE ARTHROSCOPY W/ ACL RECONSTRUCTION Left   . PORTACATH PLACEMENT Left 04/23/2020   Procedure: LEFT SIDE PORT PLACEMENT WITH ULTRASOUND GUIDANCE;  Surgeon: Rolm Bookbinder, MD;  Location: [facility];  Service: General;  Laterality: Left;  230PM START TIME PLEASE ROOM 2 THANKS  . TUBAL LIGATION      FAMILY HISTORY: Family History  Problem Relation Age of Onset  . Breast cancer Mother 41  . Arthritis Mother   . Diabetes Father   . Kidney disease Father   . Hypertension Sister   . Heart disease Maternal Grandmother   . Breast cancer Paternal Grandmother        dx >50  . Heart disease Maternal Aunt   . Breast cancer Maternal Aunt 77  . Other Maternal Aunt        brain tumor (not cancerous)  . Lung cancer Maternal Aunt   . Prostate cancer Cousin 102       localized   Her parents are both living, her father age 31 and mother age 38, as of 03/2020. Chequita has 1 brother and 2 sisters. She reports breast cancer in her mother at age 20, her maternal aunt, and her paternal grandmother. Her maternal aunt also has a history of lung cancer. She also reports prostate cancer in a maternal cousin in his 77's.   GYNECOLOGIC HISTORY:  No LMP recorded. Patient has had a hysterectomy. Menarche: 55 years old Age at first live birth: 54 years old [place] P 2 LMP 2010 with hysterectomy Contraceptive: prior use without issue HRT never used  Hysterectomy? Yes, 2010 for fibroids BSO? 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 She is divorced. She lives at home with son [name], age 49, who works for [employer]. Son [name], Brooke Bonito, age 35, works as a Development worker, international aid in [place], [place]. [name] has no grandchildren. She attends [employer].    ADVANCED DIRECTIVES: not in place; considering naming mother [name], son [name], or son [name] as healthcare powers of attorney   HEALTH MAINTENANCE: Social History   Tobacco Use  . Smoking status: Former Smoker    Packs/day: 0.25    Years: 36.00    Pack years: 9.00    Types: Cigarettes    Quit date: 04/22/2017    Years since quitting: 3.0  . Smokeless tobacco: Never Used  . Tobacco comment: She is trying to quit.    Substance Use Topics  . Alcohol use: Yes    Alcohol/week: 3.0 standard drinks    Types: 3 Glasses of wine per week    Comment: Occasional   . Drug use: Never     Colonoscopy: age 59  PAP: date unsure  Bone density: never done   No Known Allergies  Current Outpatient Medications  Medication Sig Dispense Refill  . acetaminophen (TYLENOL) 500 MG tablet Take 1,000 mg by mouth every 6 (six) hours as needed for moderate pain.    . Boric Acid Vaginal 600 MG SUPP Place 600 mg vaginally daily as needed (yeast infections).    . Cholecalciferol (VITAMIN D) 125 MCG (5000 UT) CAPS Take 5,000 Units by mouth daily.    . clobetasol ointment (TEMOVATE) 9.35 % Apply 1 application topically 2 (two) times daily as needed (rash/itching).    . Cyanocobalamin (B-12) 5000 MCG CAPS Take 5,000 mcg by mouth daily.    Marland Kitchen lidocaine-prilocaine (EMLA) cream Apply to affected area once (Patient taking differently: Apply 1 application topically daily as needed (port access).) 30 g 3  . olmesartan-hydrochlorothiazide (BENICAR HCT) 40-25 MG tablet Take 1 tablet by mouth every morning.    . prochlorperazine (COMPAZINE) 10 MG tablet Take 1 tablet (10 mg total) by mouth every 6 (six)  hours as needed (Nausea or vomiting). 30 tablet 1  . traMADol (ULTRAM) 50 MG tablet Take 2 tablets (100 mg total) by mouth every 6 (six) hours as needed. 10 tablet 0  . aspirin EC 81 MG tablet Take 81 mg by mouth daily. Swallow whole. (Patient not taking: Reported on 05/01/2020)    . loperamide (IMODIUM A-D) 2 MG tablet Take 2 mg by mouth 4 (four) times daily as needed for diarrhea or loose stools. (Patient not taking: Reported on 05/01/2020)    . loratadine (CLARITIN) 10 MG tablet Take 10 mg by mouth daily. (Patient not taking: Reported on 05/01/2020)     No current facility-administered medications for this visit.    OBJECTIVE: African-American woman who appears stated age  10:   05/01/20 0911  BP: 108/66  Pulse: (!) 103  Resp: 17  Temp: 97.7 F (36.5 C)  SpO2: 100%     Body mass index is 32.03 kg/m.   Wt Readings from Last 3 Encounters:  05/01/20 216 lb 14.4 oz (98.4 kg)  04/23/20 214 lb 8.1 oz (97.3 kg)  04/21/20 217 lb 3.2 oz (98.5 kg)      ECOG FS:1 - Symptomatic but completely ambulatory  GENERAL: Patient is a well appearing female in no acute distress HEENT:  Sclerae anicteric.  Mask in place. Neck is supple.  NODES:  No cervical, supraclavicular, or axillary lymphadenopathy palpated.  BREAST EXAM:  Deferred. LUNGS:  Clear to auscultation bilaterally.  No wheezes or rhonchi. HEART:  Regular rate and rhythm. No murmur appreciated. ABDOMEN:  Soft, nontender.  Positive, normoactive bowel sounds. No organomegaly palpated. MSK:  No focal spinal tenderness to palpation. Full range of motion bilaterally in the upper extremities. EXTREMITIES:  + right arm edema SKIN:  Clear with no obvious rashes or skin changes. No nail dyscrasia. NEURO:  Nonfocal. Well oriented.  Appropriate affect.    LAB RESULTS:  CMP     Component Value Date/Time   NA 139 04/24/2020 0832   K 3.4 (L) 04/24/2020 0832   CL 107 04/24/2020 0832   CO2 25 04/24/2020 0832   GLUCOSE 136 (H) 04/24/2020  0832   BUN 13 04/24/2020 0832   CREATININE 0.78 04/24/2020 0832   CREATININE 0.69 09/24/2013 0819   CALCIUM 10.2 04/24/2020 0832   PROT 7.9 04/24/2020 0832   ALBUMIN 3.8 04/24/2020 0832   AST 14 (L) 04/24/2020 0832   ALT 11 04/24/2020 0832   ALKPHOS 114 04/24/2020 0832   BILITOT 0.3 04/24/2020 0832   GFRNONAA >60 04/24/2020 7017  GFRNONAA >89 09/24/2013 0819   GFRAA >89 09/24/2013 0819    No results found for: TOTALPROTELP, ALBUMINELP, A1GS, A2GS, BETS, BETA2SER, GAMS, MSPIKE, SPEI  Lab Results  Component Value Date   WBC 4.3 05/01/2020   NEUTROABS 2.3 05/01/2020   HGB 10.0 (L) 05/01/2020   HCT 31.1 (L) 05/01/2020   MCV 87.1 05/01/2020   PLT 262 05/01/2020    No results found for: LABCA2  No components found for: JQBHAL937  No results for input(s): INR in the last 168 hours.  No results found for: LABCA2  No results found for: TKW409  No results found for: BDZ329  No results found for: JME268  No results found for: CA2729  No components found for: HGQUANT  No results found for: CEA1 / No results found for: CEA1   No results found for: AFPTUMOR  No results found for: CHROMOGRNA  No results found for: KPAFRELGTCHN, LAMBDASER, KAPLAMBRATIO (kappa/lambda light chains)  No results found for: HGBA, HGBA2QUANT, HGBFQUANT, HGBSQUAN (Hemoglobinopathy evaluation)   No results found for: LDH  No results found for: IRON, TIBC, IRONPCTSAT (Iron and TIBC)  No results found for: FERRITIN  Urinalysis No results found for: COLORURINE, APPEARANCEUR, LABSPEC, PHURINE, GLUCOSEU, HGBUR, BILIRUBINUR, KETONESUR, PROTEINUR, UROBILINOGEN, NITRITE, LEUKOCYTESUR   STUDIES: DG Chest 1 View  Result Date: 04/23/2020 CLINICAL DATA:  Chest port placement EXAM: DG C-ARM 1-60 MIN; CHEST  1 VIEW FLUOROSCOPY TIME:  Fluoroscopy Time:  20.7 seconds Radiation Exposure Index (if provided by the fluoroscopic device): 2.42 mGy Number of Acquired Spot Images: 1 COMPARISON:  None.  FINDINGS: Single fluoroscopic image was obtained during the performance of the procedure and is provided for interpretation only. Left chest wall port via internal jugular approach tip overlies superior vena cava. Visualized portions of the left chest are clear. IMPRESSION: 1. Left chest wall port as above. Please refer to the operative report. Electronically Signed   By: Randa Ngo M.D.   On: 04/23/2020 15:40   CT Chest W Contrast  Result Date: 04/20/2020 CLINICAL DATA:  Breast cancer staging, new breast cancer diagnosis. 55 year old female with new diagnosis of breast cancer in January 2022, planning for immuno and chemotherapy. EXAM: CT CHEST WITH CONTRAST TECHNIQUE: Multidetector CT imaging of the chest was performed during intravenous contrast administration. CONTRAST:  16m OMNIPAQUE IOHEXOL 300 MG/ML  SOLN COMPARISON:  None FINDINGS: Cardiovascular: Normal caliber thoracic aorta. Heart size top normal. No pericardial effusion. Central pulmonary vasculature unremarkable on venous phase normal caliber. Mediastinum/Nodes: RIGHT axillary adenopathy. Largest lymph node measuring 1.7 x 1.9 cm (image 39, series 2) Lymph nodes track towards subpectoral nodal stations (image 26, series 2) 11 mm lymph node with smaller lymph nodes with rounded morphologic features tracking beneath the RIGHT pectoral musculature best seen on image 21 of series 2. LEFT thoracic inlet with numerous small lymph nodes suspicious given other findings tracking into the LEFT low neck (image 14, series 2 6 mm and image 7 of series 2 7 mm. Thyroid grossly normal. No LEFT axillary lymphadenopathy the Known breast mass may be indicated by asymmetric density in the RIGHT breast as compared to the LEFT with biopsy clips in the area of the upper outer RIGHT breast. Internal mammary nodal prominence on the RIGHT 4 mm on image 37 of series 2 No hilar or mediastinal lymphadenopathy.  Esophagus mildly patulous. Lungs and pleura: Basilar  atelectasis. Chest wall mass, see below. Pleural nodularity in the LEFT lung base at 9 mm on image 94 of series 7 airways  are patent. Upper Abdomen: Imaged portions of liver, gallbladder, pancreas, spleen and adrenal glands are normal. Kidneys in the upper poles enhance symmetrically. No acute gastrointestinal process. Musculoskeletal: Destructive lesion involving the RIGHT posterior ninth rib measures 2.2 x 1.5 cm and is associated with subacute appearing pathologic fracture. Multifocal lytic changes seen in the ribs and in the spine. Lytic process in the RIGHT second rib (image 14, series 2) third rib with lytic and sclerotic changes on image 24 of series 2 also on the RIGHT. Similar involvement of the RIGHT fourth rib and sixth rib Subtle lytic change in the LEFT posterior fifth rib on image 48 of series 2. Multilevel spinal involvement, lytic and sclerotic changes at at multiple levels, essentially every visualized level of the spine. Example of lytic changes seen at T12 violating the posterior cortex along the LEFT posterolateral vertebral body with central canal soft tissue. Another lytic focus with central soft tissue on image 99 of series 2 measuring 16 mm at thew T9 level as another example of this process. Loss of height at T6 appears more likely to be chronic but is age indeterminate. IMPRESSION: 1. Soft tissue in the RIGHT breast presumably at the site of known breast mass, associated with axillary adenopathy and widespread bony lesions with both lytic and sclerotic changes involving nearly every level of the spine and with destruction and soft tissue about the RIGHT ninth rib posteriorly. 2. Potential LEFT thoracic inlet adenopathy as well. PET scan may be helpful for complete staging in this patient. 3. Given extensive spinal involvement and posterior cortical disruption and soft tissue extending in the central canal at the T11 level, spinal MRI may also be helpful. 4. T6 compression appears more  likely to be chronic. Correlate with any symptoms in this location, approximately 40% loss of height at this level. 5. Pleural nodularity versus focal atelectasis in the LEFT lung base, attention on follow-up. 6. Aortic atherosclerosis. These results will be called to the ordering clinician or representative by the Radiologist Assistant, and communication documented in the PACS or [employer]. Aortic Atherosclerosis (ICD10-I70.0). Electronically Signed   By: Zetta Bills M.D.   On: 04/20/2020 11:42   MR THORACIC SPINE W WO CONTRAST  Result Date: 04/30/2020 CLINICAL DATA:  Metastatic breast cancer. Evaluate for spinal cord compression. EXAM: MRI THORACIC WITHOUT AND WITH CONTRAST TECHNIQUE: Multiplanar and multiecho pulse sequences of the thoracic spine were obtained without and with intravenous contrast. CONTRAST:  70m GADAVIST GADOBUTROL 1 MMOL/ML IV SOLN COMPARISON:  Chest CT 04/20/2020, bone scan 04/20/2020 FINDINGS: Alignment:  Physiologic. Vertebrae: Chronic T6 vertebral body compression fracture with approximately 20% anterior height loss. Remainder the vertebral body heights are maintained. No acute fracture. No discitis or osteomyelitis. Abnormal bone lesions throughout the thoracic spine vertebral bodies and lower cervical spine vertebral bodies and posterior elements consistent with metastatic disease. Abnormal bone lesion involving the left T8 pedicle and right T6 pedicle. No epidural soft tissue component. Abnormal bone lesions scattered throughout the visualized portions of the ribs with a destructive bone lesion with the large soft tissue component involving the right posterior ninth rib. Cord:  Normal signal and morphology. Paraspinal and other soft tissues: No acute paraspinal abnormality. Disc levels: No significant thoracic spine disc protrusion, foraminal stenosis or central canal stenosis. IMPRESSION: 1. 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 No epidural soft tissue component. No cord compression. Electronically Signed   By: HKathreen Devoid  On: 04/30/2020 10:52  NM Bone Scan Whole Body  Result Date: 04/21/2020 CLINICAL DATA:  Breast cancer, staging new breast cancer staging prior to chemo/metastatic lymph node EXAM: NUCLEAR MEDICINE WHOLE BODY BONE SCAN TECHNIQUE: Whole body anterior and posterior images were obtained approximately 3 hours after intravenous injection of radiopharmaceutical. RADIOPHARMACEUTICALS:  20.9 mCi Technetium-74mMDP IV COMPARISON:  CT 04/20/2020 FINDINGS: Along the inferior medial border of the RIGHT scapula there is a rim of intense radiotracer activity which corresponds to a mixed lytic lesion on CT. Focal uptake posterior RIGHT ninth rib corresponds to pathologic fracture on comparison CT On comparison CT there several small sclerotic lesions in the spine as well as a lytic lesion at T9. These are not well appreciated on the bone scan. There is intense uptake associated with the LEFT knee involving the femoral condyles and tibial plateau. Tiny focus of uptake in the midshaft LEFT tibia. IMPRESSION: 1. Multifocal skeletal metastasis corresponds to findings on comparison CT chest. 2. Uptake in the LEFT knee is favored severe degenerative arthropathy. Electronically Signed   By: SSuzy BouchardM.D.   On: 04/21/2020 07:42   MR BREAST BILATERAL W WO CONTRAST INC CAD  Result Date: 04/17/2020 CLINICAL DATA:  Recently diagnosed invasive mammary carcinoma and mammary carcinoma in situ in the 10 o'clock position of the right breast. Recently diagnosed metastatic intramammary lymph node in the 10 o'clock position of the right breast. Recently diagnosed metastatic right axillary adenopathy. LABS:  None obtained on site today. EXAM: BILATERAL BREAST MRI WITH AND WITHOUT CONTRAST TECHNIQUE: Multiplanar, multisequence MR images of both breasts were obtained prior to and following the intravenous administration of 10 ml of  Gadavist Three-dimensional MR images were rendered by post-processing of the original MR data on an independent workstation. The three-dimensional MR images were interpreted, and findings are reported in the following complete MRI report for this study. Three dimensional images were evaluated at the independent interpreting workstation using the DynaCAD thin client. COMPARISON:  Bilateral screening mammogram dated 02/25/2020, right diagnostic mammogram and right breast ultrasound dated 03/26/2020 and subsequent right breast and right axillary ultrasound-guided core needle biopsies. FINDINGS: Breast composition: b. Scattered fibroglandular tissue. Background parenchymal enhancement: Mild. Right breast: Irregular, spiculated enhancing mass in the 10 o'clock position of the right breast, middle depth, containing a biopsy marker clip artifact. This measures 4.5 x 2.9 cm on image number 51 series 16 and 1.9 cm in cephalocaudal dimension in the sagittal plane. This has predominantly persistent enhancement kinetics with some plateau and rapid wash-in/washout kinetics. There is associated patchy non mass enhancement extending anteriorly and posteriorly with linear extensions, spanning 9.2 x 3.1 cm on image number 54 series 16. This is also in the upper outer quadrant and extends from the superior retroareolar region to the pectoralis major muscle with mild patchy enhancement in outer portions of the pectoralis major muscle on the right as well as edema in the muscle. Anteriorly, the non mass enhancement extends into the anterior aspect of the upper inner quadrant of the breast and is somewhat more focal and confluent in that area. This also has predominantly persistent enhancement kinetics with some plateau and rapid wash-in/washout kinetics. The recently biopsied intramammary lymph node is within the area of patchy non mass enhancement in the upper-outer quadrant of the right breast, slightly posteriorly. This contains a  biopsy marker clip artifact laterally and has predominantly rapid wash-in/washout enhancement kinetics. Left breast: Nodular background parenchymal enhancement. No mass or abnormal enhancement suspicious for malignancy. Lymph nodes: There are at least  12 abnormally enlarged right axillary lymph nodes with abnormal morphology, including the biopsied lymph node, containing a biopsy marker clip artifact in the inferior axilla. Two of the enlarged lymph nodes are retropectoral nodes in the infraclavicular region. No enlarged internal mammary lymph nodes are seen and there is no adenopathy on the left. Ancillary findings: 8 mm enhancing mass in the inferior sternum on the left. There are also several small rounded and somewhat patchy areas of enhancement in the manubrium on the left, near the sternoclavicular joint. A 3 mm enhancing nodule is demonstrated in the anterior right middle lobe medially on image number 31 series 5. IMPRESSION: 1. 4.5 x 2.9 x 1.9 cm area of biopsy-proven invasive mammary carcinoma and mammary carcinoma in situ in the upper-outer quadrant of the right breast and extending into the anterior aspect of the upper inner quadrant of the right breast. This extends from the superior retroareolar region posteriorly to the level of the pectoralis major muscle laterally. 2. Edema and mild patchy enhancement in the lateral aspect of the pectoralis muscle on the right. This could represent post biopsy changes or small areas of tumor invasion. 3. Biopsy-proven metastatic intramammary lymph node within the patchy non mass enhancement in the upper-outer quadrant of the right breast, slightly posteriorly. 4. At least 12 metastatic right axillary lymph nodes, including 2 retropectoral nodes in the infraclavicular region. 5. 8 mm enhancing sternal mass on the left and several smaller enhancing masses in the manubrium on the left. These are suspicious for the possibility of bony metastatic disease. 6. 3 mm right  middle lobe nodule. This is nonspecific and of low suspicion for malignancy or metastatic disease. RECOMMENDATION: Treatment plan. BI-RADS CATEGORY  6: Known biopsy-proven malignancy. Electronically Signed   By: Claudie Revering M.D.   On: 04/17/2020 08:45   DG C-Arm 1-60 Min  Result Date: 04/23/2020 CLINICAL DATA:  Chest port placement EXAM: DG C-ARM 1-60 MIN; CHEST  1 VIEW FLUOROSCOPY TIME:  Fluoroscopy Time:  20.7 seconds Radiation Exposure Index (if provided by the fluoroscopic device): 2.42 mGy Number of Acquired Spot Images: 1 COMPARISON:  None. FINDINGS: Single fluoroscopic image was obtained during the performance of the procedure and is provided for interpretation only. Left chest wall port via internal jugular approach tip overlies superior vena cava. Visualized portions of the left chest are clear. IMPRESSION: 1. Left chest wall port as above. Please refer to the operative report. Electronically Signed   By: Randa Ngo M.D.   On: 04/23/2020 15:40   ECHOCARDIOGRAM COMPLETE  Result Date: 04/15/2020    ECHOCARDIOGRAM REPORT   Patient Name:   SAMHITHA ROSEN Date of Exam: 04/15/2020 Medical Rec #:  [id number]      Height:       69.0 in Accession #:    [id number]     Weight:       219.4 lb Date of Birth:  [birth year]     BSA:          2.149 m Patient Age:    55 years       BP:           127/73 mmHg Patient Gender: F              HR:           80 bpm. Exam Location:  Outpatient Procedure: 2D Echo, 3D Echo, Color Doppler, Cardiac Doppler and Strain Analysis Indications:    Chemo Z09  History:  Patient has no prior history of Echocardiogram examinations.                 Risk Factors:Hypertension. Breast Cancer.  Sonographer:    Darlina Sicilian RDCS Referring Phys: [place]  1. Left ventricular ejection fraction, by estimation, is 55 to 60%. The left ventricle has normal function. The left ventricle has no regional wall motion abnormalities. Left ventricular diastolic parameters  are consistent with Grade I diastolic dysfunction (impaired relaxation).  2. Right ventricular systolic function is mildly reduced. The right ventricular size is mildly enlarged. There is mildly elevated pulmonary artery systolic pressure.  3. The mitral valve is normal in structure. Trivial mitral valve regurgitation. No evidence of mitral stenosis.  4. The TV is not well seen but is mildly thickened. There is severe tricuspid regurgitation. Consider TEE to more fully evalaute. The tricuspid valve is abnormal. Severe tricuspid stenosis.  5. The aortic valve is normal in structure. There is mild calcification of the aortic valve. Aortic valve regurgitation is not visualized. No aortic stenosis is present.  6. The inferior vena cava is normal in size with greater than 50% respiratory variability, suggesting right atrial pressure of 3 mmHg. FINDINGS  Left Ventricle: Left ventricular ejection fraction, by estimation, is 55 to 60%. The left ventricle has normal function. The left ventricle has no regional wall motion abnormalities. The left ventricular internal cavity size was normal in size. There is  no left ventricular hypertrophy. Left ventricular diastolic parameters are consistent with Grade I diastolic dysfunction (impaired relaxation). Right Ventricle: The right ventricular size is mildly enlarged. No increase in right ventricular wall thickness. Right ventricular systolic function is mildly reduced. There is mildly elevated pulmonary artery systolic pressure. The tricuspid regurgitant  velocity is 3.12 m/s, and with an assumed right atrial pressure of 3 mmHg, the estimated right ventricular systolic pressure is 65.7 mmHg. Left Atrium: Left atrial size was normal in size. Right Atrium: Right atrial size was normal in size. Pericardium: There is no evidence of pericardial effusion. Mitral Valve: The mitral valve is normal in structure. There is mild thickening of the mitral valve leaflet(s). Trivial mitral valve  regurgitation. No evidence of mitral valve stenosis. Tricuspid Valve: The TV is not well seen but is mildly thickened. There is severe tricuspid regurgitation. Consider TEE to more fully evalaute. The tricuspid valve is abnormal. Tricuspid valve regurgitation is not demonstrated. Severe tricuspid stenosis. Aortic Valve: The aortic valve is normal in structure. There is mild calcification of the aortic valve. Aortic valve regurgitation is not visualized. No aortic stenosis is present. Pulmonic Valve: The pulmonic valve was normal in structure. Pulmonic valve regurgitation is trivial. No evidence of pulmonic stenosis. Aorta: The aortic root is normal in size and structure. Venous: The inferior vena cava is normal in size with greater than 50% respiratory variability, suggesting right atrial pressure of 3 mmHg. IAS/Shunts: No atrial level shunt detected by color flow Doppler.  LEFT VENTRICLE PLAX 2D LVIDd:         4.70 cm  Diastology LVIDs:         3.30 cm  LV e' medial:    7.83 cm/s LV PW:         1.00 cm  LV E/e' medial:  8.8 LV IVS:        0.90 cm  LV e' lateral:   11.00 cm/s LVOT diam:     1.90 cm  LV E/e' lateral: 6.2 LV SV:  58 LV SV Index:   27 LVOT Area:     2.84 cm  RIGHT VENTRICLE RV S prime:     13.60 cm/s TAPSE (M-mode): 2.4 cm LEFT ATRIUM             Index       RIGHT ATRIUM           Index LA diam:        3.60 cm 1.68 cm/m  RA Area:     15.30 cm LA Vol (A2C):   40.5 ml 18.85 ml/m RA Volume:   37.60 ml  17.50 ml/m LA Vol (A4C):   42.5 ml 19.78 ml/m LA Biplane Vol: 41.3 ml 19.22 ml/m  AORTIC VALVE LVOT Vmax:   102.00 cm/s LVOT Vmean:  65.800 cm/s LVOT VTI:    0.203 m  AORTA Ao Root diam: 2.70 cm Ao Asc diam:  2.60 cm MITRAL VALVE               TRICUSPID VALVE MV Area (PHT): 5.27 cm    TR Peak grad:   38.9 mmHg MV Decel Time: 144 msec    TR Vmax:        312.00 cm/s MV E velocity: 68.70 cm/s MV A velocity: 80.60 cm/s  SHUNTS MV E/A ratio:  0.85        Systemic VTI:  0.20 m                             Systemic Diam: 1.90 cm Kara Bickers MD Electronically signed by Kara Bickers MD Signature Date/Time: 04/15/2020/4:12:20 PM    Final      ELIGIBLE FOR AVAILABLE RESEARCH PROTOCOL: no  ASSESSMENT: 55 y.o. [place] woman status post right breast upper outer quadrant biopsy 03/27/2020 for a clinical T1c N3 M1, stageIV invasive ductal carcinoma, functionally triple negative, with an MIB-1 of 30-40%  (a) chest CT scan 04/20/2020 shows in addition to the breast mass and regional adenopathy, lytic and sclerotic bone changes, possible left thoracic adenopathy, and indeterminant left pleural nodularity  (b) bone scan 04/21/2020 confirms multiple bone lesions  (c) thoracic MRI  (1) genetics testing pending  (2) neoadjuvant chemotherapy will consist of pembrolizumab every 3 weeks, carboplatin every 3 weeks x 4 with paclitaxel weekly x12, then possibly followed by doxorubicin and cyclophosphamide every 3 weeks x 4  (3) zoledronate to start 05/08/2018, repeated every 12 weeks  (4) definitive surgery to follow  (5) adjuvant radiation   PLAN:  [name] is here today for follow up and evaluation of her stage IV breast cancer.  She is currently receiving neoadjuvant chemotherapy that began last week and she is tolerating treatment quite well.  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    She is not experiencing peripheral neuropathy. We discussed cryotherapy.  I recommended that she ask her nurse this time to help her with putting ice in the bags.    [name] and I discussed healthy diet and exercise.  She is not experiencing pain, and we are going to help her with her FMLA paperwork today.    [name] will return in one week for labs and treatment.  She will see [place] again on cycle 2 day 1 of her therapy.    She knows to call for any questions that may arise between now and her next appointment.  We are happy to see her sooner  if  needed.    Total encounter time: 20 minutes*  Wilber Bihari, NP 05/01/20 9:26 AM Medical Oncology and Hematology [facility] [place], [place] [address] Tel. [phone]    Fax. [phone]  *Total Encounter Time as defined by the Centers for Medicare and Medicaid Services includes, in addition to the face-to-face time of a patient visit (documented in the note above) non-face-to-face time: obtaining and reviewing outside history, ordering and reviewing medications, tests or procedures, care coordination (communications with other health care professionals or caregivers) and documentation in the medical record.

## 2020-05-01 NOTE — Patient Instructions (Signed)
[facility] Discharge Instructions for Patients Receiving Chemotherapy  Today you received the following chemotherapy agents Taxol  To help prevent nausea and vomiting after your treatment, we encourage you to take your nausea medication as directed.   If you develop nausea and vomiting that is not controlled by your nausea medication, call the clinic.   BELOW ARE SYMPTOMS THAT SHOULD BE REPORTED IMMEDIATELY:  *FEVER GREATER THAN 100.5 F  *CHILLS WITH OR WITHOUT FEVER  NAUSEA AND VOMITING THAT IS NOT CONTROLLED WITH YOUR NAUSEA MEDICATION  *UNUSUAL SHORTNESS OF BREATH  *UNUSUAL BRUISING OR BLEEDING  TENDERNESS IN MOUTH AND THROAT WITH OR WITHOUT PRESENCE OF ULCERS  *URINARY PROBLEMS  *BOWEL PROBLEMS  UNUSUAL RASH Items with * indicate a potential emergency and should be followed up as soon as possible.  Feel free to call the clinic should you have any questions or concerns. The clinic phone number is [phone].  Please show the CHEMO ALERT CARD at check-in to the Emergency Department and triage nurse.  Paclitaxel injection What is this medicine? PACLITAXEL (PAK li TAX el) is a chemotherapy drug. It targets fast dividing cells, like cancer cells, and causes these cells to die. This medicine is used to treat ovarian cancer, breast cancer, lung cancer, Kaposi's sarcoma, and other cancers. This medicine may be used for other purposes; ask your health care provider or pharmacist if you have questions. COMMON BRAND NAME(S): Onxol, Taxol What should I tell my health care provider before I take this medicine? They need to know if you have any of these conditions:  history of irregular heartbeat  liver disease  low blood counts, like low white cell, platelet, or red cell counts  lung or breathing disease, like asthma  tingling of the fingers or toes, or other nerve disorder  an unusual or allergic reaction to paclitaxel, alcohol, polyoxyethylated castor  oil, other chemotherapy, other medicines, foods, dyes, or preservatives  pregnant or trying to get pregnant  breast-feeding How should I use this medicine? This drug is given as an infusion into a vein. It is administered in a hospital or clinic by a specially trained health care professional. Talk to your pediatrician regarding the use of this medicine in children. Special care may be needed. Overdosage: If you think you have taken too much of this medicine contact a poison control center or emergency room at once. NOTE: This medicine is only for you. Do not share this medicine with others. What if I miss a dose? It is important not to miss your dose. Call your doctor or health care professional if you are unable to keep an appointment. What may interact with this medicine? Do not take this medicine with any of the following medications:  live virus vaccines This medicine may also interact with the following medications:  antiviral medicines for hepatitis, HIV or AIDS  certain antibiotics like erythromycin and clarithromycin  certain medicines for fungal infections like ketoconazole and itraconazole  certain medicines for seizures like carbamazepine, phenobarbital, phenytoin  gemfibrozil  nefazodone  rifampin  St. John's wort This list may not describe all possible interactions. Give your health care provider a list of all the medicines, herbs, non-prescription drugs, or dietary supplements you use. Also tell them if you smoke, drink alcohol, or use illegal drugs. Some items may interact with your medicine. What should I watch for while using this medicine? Your condition will be monitored carefully while you are receiving this medicine. You will need important blood work   work done while you are taking this medicine. This medicine can cause serious allergic reactions. To reduce your risk you will need to take other medicine(s) before treatment with this medicine. If you experience  allergic reactions like skin rash, itching or hives, swelling of the face, lips, or tongue, tell your doctor or health care professional right away. In some cases, you may be given additional medicines to help with side effects. Follow all directions for their use. This drug may make you feel generally unwell. This is not uncommon, as chemotherapy can affect healthy cells as well as cancer cells. Report any side effects. Continue your course of treatment even though you feel ill unless your doctor tells you to stop. Call your doctor or health care professional for advice if you get a fever, chills or sore throat, or other symptoms of a cold or flu. Do not treat yourself. This drug decreases your body's ability to fight infections. Try to avoid being around people who are sick. This medicine may increase your risk to bruise or bleed. Call your doctor or health care professional if you notice any unusual bleeding. Be careful brushing and flossing your teeth or using a toothpick because you may get an infection or bleed more easily. If you have any dental work done, tell your dentist you are receiving this medicine. Avoid taking products that contain aspirin, acetaminophen, ibuprofen, naproxen, or ketoprofen unless instructed by your doctor. These medicines may hide a fever. Do not become pregnant while taking this medicine. Women should inform their doctor if they wish to become pregnant or think they might be pregnant. There is a potential for serious side effects to an unborn child. Talk to your health care professional or pharmacist for more information. Do not breast-feed an infant while taking this medicine. Men are advised not to father a child while receiving this medicine. This product may contain alcohol. Ask your pharmacist or healthcare provider if this medicine contains alcohol. Be sure to tell all healthcare providers you are taking this medicine. Certain medicines, like metronidazole and  disulfiram, can cause an unpleasant reaction when taken with alcohol. The reaction includes flushing, headache, nausea, vomiting, sweating, and increased thirst. The reaction can last from 30 minutes to several hours. What side effects may I notice from receiving this medicine? Side effects that you should report to your doctor or health care professional as soon as possible:  allergic reactions like skin rash, itching or hives, swelling of the face, lips, or tongue  breathing problems  changes in vision  fast, irregular heartbeat  high or low blood pressure  mouth sores  pain, tingling, numbness in the hands or feet  signs of decreased platelets or bleeding - bruising, pinpoint red spots on the skin, black, tarry stools, blood in the urine  signs of decreased red blood cells - unusually weak or tired, feeling faint or lightheaded, falls  signs of infection - fever or chills, cough, sore throat, pain or difficulty passing urine  signs and symptoms of liver injury like dark yellow or brown urine; general ill feeling or flu-like symptoms; light-colored stools; loss of appetite; nausea; right upper belly pain; unusually weak or tired; yellowing of the eyes or skin  swelling of the ankles, feet, hands  unusually slow heartbeat Side effects that usually do not require medical attention (report to your doctor or health care professional if they continue or are bothersome):  diarrhea  hair loss  loss of appetite  muscle or joint pain  nausea, vomiting  pain, redness, or irritation at site where injected  tiredness This list may not describe all possible side effects. Call your doctor for medical advice about side effects. You may report side effects to FDA at 1-800-FDA-1088. Where should I keep my medicine? This drug is given in a hospital or clinic and will not be stored at home. NOTE: This sheet is a summary. It may not cover all possible information. If you have questions  about this medicine, talk to your doctor, pharmacist, or health care provider.  2021 Elsevier/Gold Standard (2019-01-30 13:37:23)

## 2020-05-01 NOTE — Progress Notes (Signed)
Met with patient in lobby and provided her with copy of grant approval and gave gift card from grant.  Obtained her signatures for [employer] application for [employer] assistance.   She has my card for any additional financial questions or concerns.  Faxed completed application to [employer] for [employer]. Fax received ok per confirmation sheet.

## 2020-05-01 NOTE — Progress Notes (Signed)
Met with patient at registration to obtain income for grant.  Patient approved for one-time $1000 [employer] grant to assist with personal expenses while going through treatment. Discussed expenses and how they are covered. Will give her a copy of the approval letter and expense sheet along with the Outpatient pharmacy information. She will receive gift card today from her grant as I will catch up with her between doctor and infusion appointment to provide everything due to RN waiting for her to flush.  Will also have her to complete her portion of [employer] application for [employer].  She has my card for any additional financial questions or concerns.

## 2020-05-01 NOTE — Patient Instructions (Signed)

## 2020-05-01 NOTE — Progress Notes (Signed)
Per provider and patient request RN help with completion of FMLA forms.  Original copy was provided to patient, pt reports she will fax and deliver to employer.  Copy placed to HIM. Pt verbalized appreciation.

## 2020-05-02 LAB — T4: T4, Total: 9 ug/dL (ref 4.5–12.0)

## 2020-05-04 ENCOUNTER — Telehealth: Payer: Self-pay

## 2020-05-04 ENCOUNTER — Telehealth: Payer: Self-pay | Admitting: Adult Health

## 2020-05-04 NOTE — Telephone Encounter (Signed)
No 2/18 los. No changes made to pt's schedule.  °

## 2020-05-04 NOTE — Telephone Encounter (Signed)
Returned call to pt regarding sensation she had yesterday when getting out of the car. Pt stated that she felt something under her right breast "pop" . Pt states there has been a mass there since Jan. (MD aware) and the mass is unchanged. Pt states she is not having pain but now feels very "sore" there. Pt wanted to make sure  That she did not need to do anything any differently. Pt states no pain or SOB, just "soreness". Pt to call back if anything changes or she develops pain. Will inform Dr. [name].

## 2020-05-08 ENCOUNTER — Inpatient Hospital Stay: Payer: BC Managed Care – PPO

## 2020-05-08 ENCOUNTER — Other Ambulatory Visit: Payer: Self-pay

## 2020-05-08 VITALS — BP 110/70 | HR 93 | Temp 98.5°F | Resp 18

## 2020-05-08 DIAGNOSIS — Z95828 Presence of other vascular implants and grafts: Secondary | ICD-10-CM

## 2020-05-08 DIAGNOSIS — C50411 Malignant neoplasm of upper-outer quadrant of right female breast: Secondary | ICD-10-CM | POA: Diagnosis not present

## 2020-05-08 DIAGNOSIS — Z170 Estrogen receptor positive status [ER+]: Secondary | ICD-10-CM

## 2020-05-08 DIAGNOSIS — C7951 Secondary malignant neoplasm of bone: Secondary | ICD-10-CM

## 2020-05-08 LAB — CBC WITH DIFFERENTIAL (CANCER CENTER ONLY)
Abs Immature Granulocytes: 0.02 10*3/uL (ref 0.00–0.07)
Basophils Absolute: 0 10*3/uL (ref 0.0–0.1)
Basophils Relative: 1 %
Eosinophils Absolute: 0.1 10*3/uL (ref 0.0–0.5)
Eosinophils Relative: 2 %
HCT: 29.4 % — ABNORMAL LOW (ref 36.0–46.0)
Hemoglobin: 9.5 g/dL — ABNORMAL LOW (ref 12.0–15.0)
Immature Granulocytes: 1 %
Lymphocytes Relative: 39 %
Lymphs Abs: 1.6 10*3/uL (ref 0.7–4.0)
MCH: 28.8 pg (ref 26.0–34.0)
MCHC: 32.3 g/dL (ref 30.0–36.0)
MCV: 89.1 fL (ref 80.0–100.0)
Monocytes Absolute: 0.6 10*3/uL (ref 0.1–1.0)
Monocytes Relative: 13 %
Neutro Abs: 1.9 10*3/uL (ref 1.7–7.7)
Neutrophils Relative %: 44 %
Platelet Count: 266 10*3/uL (ref 150–400)
RBC: 3.3 MIL/uL — ABNORMAL LOW (ref 3.87–5.11)
RDW: 11.9 % (ref 11.5–15.5)
WBC Count: 4.2 10*3/uL (ref 4.0–10.5)
nRBC: 0 % (ref 0.0–0.2)

## 2020-05-08 LAB — CMP (CANCER CENTER ONLY)
ALT: 24 U/L (ref 0–44)
AST: 20 U/L (ref 15–41)
Albumin: 3.9 g/dL (ref 3.5–5.0)
Alkaline Phosphatase: 114 U/L (ref 38–126)
Anion gap: 7 (ref 5–15)
BUN: 20 mg/dL (ref 6–20)
CO2: 25 mmol/L (ref 22–32)
Calcium: 9.7 mg/dL (ref 8.9–10.3)
Chloride: 103 mmol/L (ref 98–111)
Creatinine: 0.79 mg/dL (ref 0.44–1.00)
GFR, Estimated: >60 mL/min (ref >60)
Glucose, Bld: 122 mg/dL — ABNORMAL HIGH (ref 70–99)
Potassium: 4 mmol/L (ref 3.5–5.1)
Sodium: 135 mmol/L (ref 135–145)
Total Bilirubin: <0.2 mg/dL — ABNORMAL LOW (ref 0.3–1.2)
Total Protein: 7.4 g/dL (ref 6.5–8.1)

## 2020-05-08 LAB — TSH: TSH: 1.028 u[IU]/mL (ref 0.308–3.960)

## 2020-05-08 MED ORDER — HEPARIN SOD (PORK) LOCK FLUSH 100 UNIT/ML IV SOLN
500.0000 [IU] | Freq: Once | INTRAVENOUS | Status: AC | PRN
Start: 2020-05-08 — End: 2020-05-08
  Administered 2020-05-08: 500 [IU]
  Filled 2020-05-08: qty 5

## 2020-05-08 MED ORDER — SODIUM CHLORIDE 0.9 % IV SOLN
Freq: Once | INTRAVENOUS | Status: AC
  Filled 2020-05-08: qty 250

## 2020-05-08 MED ORDER — SODIUM CHLORIDE 0.9% FLUSH
10.0000 mL | INTRAVENOUS | Status: DC | PRN
  Administered 2020-05-08: 10 mL
  Filled 2020-05-08: qty 10

## 2020-05-08 MED ORDER — DIPHENHYDRAMINE HCL 50 MG/ML IJ SOLN
INTRAMUSCULAR | Status: AC
  Filled 2020-05-08: qty 1

## 2020-05-08 MED ORDER — FAMOTIDINE IN NACL 20-0.9 MG/50ML-% IV SOLN
INTRAVENOUS | Status: AC
  Filled 2020-05-08: qty 50

## 2020-05-08 MED ORDER — SODIUM CHLORIDE 0.9 % IV SOLN
80.0000 mg/m2 | Freq: Once | INTRAVENOUS | Status: AC
  Administered 2020-05-08: 174 mg via INTRAVENOUS
  Filled 2020-05-08: qty 29

## 2020-05-08 MED ORDER — SODIUM CHLORIDE 0.9% FLUSH
10.0000 mL | INTRAVENOUS | Status: DC | PRN
  Administered 2020-05-08: 10 mL via INTRAVENOUS
  Filled 2020-05-08: qty 10

## 2020-05-08 MED ORDER — SODIUM CHLORIDE 0.9 % IV SOLN
10.0000 mg | Freq: Once | INTRAVENOUS | Status: AC
  Administered 2020-05-08: 10 mg via INTRAVENOUS
  Filled 2020-05-08: qty 10

## 2020-05-08 MED ORDER — ZOLEDRONIC ACID 4 MG/100ML IV SOLN
INTRAVENOUS | Status: AC
  Filled 2020-05-08: qty 100

## 2020-05-08 MED ORDER — DIPHENHYDRAMINE HCL 50 MG/ML IJ SOLN
25.0000 mg | Freq: Once | INTRAMUSCULAR | Status: AC
Start: 2020-05-08 — End: 2020-05-08
  Administered 2020-05-08: 25 mg via INTRAVENOUS

## 2020-05-08 MED ORDER — FAMOTIDINE IN NACL 20-0.9 MG/50ML-% IV SOLN
20.0000 mg | Freq: Once | INTRAVENOUS | Status: AC
  Administered 2020-05-08: 20 mg via INTRAVENOUS

## 2020-05-08 MED ORDER — ZOLEDRONIC ACID 4 MG/100ML IV SOLN
4.0000 mg | Freq: Once | INTRAVENOUS | Status: AC
  Administered 2020-05-08: 4 mg via INTRAVENOUS

## 2020-05-08 NOTE — Patient Instructions (Signed)
Implanted Port Insertion, Care After This sheet gives you information about how to care for yourself after your procedure. Your health care provider may also give you more specific instructions. If you have problems or questions, contact your health care provider. What can I expect after the procedure? After the procedure, it is common to have:  Discomfort at the port insertion site.  Bruising on the skin over the port. This should improve over 3-4 days. Follow these instructions at home: Port care  After your port is placed, you will get a manufacturer's information card. The card has information about your port. Keep this card with you at all times.  Take care of the port as told by your health care provider. Ask your health care provider if you or a family member can get training for taking care of the port at home. A home health care nurse may also take care of the port.  Make sure to remember what type of port you have. Incision care  Follow instructions from your health care provider about how to take care of your port insertion site. Make sure you: ? Wash your hands with soap and water before and after you change your bandage (dressing). If soap and water are not available, use hand sanitizer. ? Change your dressing as told by your health care provider. ? Leave stitches (sutures), skin glue, or adhesive strips in place. These skin closures may need to stay in place for 2 weeks or longer. If adhesive strip edges start to loosen and curl up, you may trim the loose edges. Do not remove adhesive strips completely unless your health care provider tells you to do that.  Check your port insertion site every day for signs of infection. Check for: ? Redness, swelling, or pain. ? Fluid or blood. ? Warmth. ? Pus or a bad smell.      Activity  Return to your normal activities as told by your health care provider. Ask your health care provider what activities are safe for you.  Do not  lift anything that is heavier than 10 lb (4.5 kg), or the limit that you are told, until your health care provider says that it is safe. General instructions  Take over-the-counter and prescription medicines only as told by your health care provider.  Do not take baths, swim, or use a hot tub until your health care provider approves. Ask your health care provider if you may take showers. You may only be allowed to take sponge baths.  Do not drive for 24 hours if you were given a sedative during your procedure.  Wear a medical alert bracelet in case of an emergency. This will tell any health care providers that you have a port.  Keep all follow-up visits as told by your health care provider. This is important. Contact a health care provider if:  You cannot flush your port with saline as directed, or you cannot draw blood from the port.  You have a fever or chills.  You have redness, swelling, or pain around your port insertion site.  You have fluid or blood coming from your port insertion site.  Your port insertion site feels warm to the touch.  You have pus or a bad smell coming from the port insertion site. Get help right away if:  You have chest pain or shortness of breath.  You have bleeding from your port that you cannot control. Summary  Take care of the port as told by your   health care provider. Keep the manufacturer's information card with you at all times.  Change your dressing as told by your health care provider.  Contact a health care provider if you have a fever or chills or if you have redness, swelling, or pain around your port insertion site.  Keep all follow-up visits as told by your health care provider. This information is not intended to replace advice given to you by your health care provider. Make sure you discuss any questions you have with your health care provider. Document Revised: 09/26/2017 Document Reviewed: 09/26/2017 Elsevier Patient Education   2021 [employer] Inc[employer]  

## 2020-05-08 NOTE — Patient Instructions (Signed)
[facility] Discharge Instructions for Patients Receiving Chemotherapy  Today you received the following chemotherapy agents: paclitaxel.  To help prevent nausea and vomiting after your treatment, we encourage you to take your nausea medication as directed.   If you develop nausea and vomiting that is not controlled by your nausea medication, call the clinic.   BELOW ARE SYMPTOMS THAT SHOULD BE REPORTED IMMEDIATELY:  *FEVER GREATER THAN 100.5 F  *CHILLS WITH OR WITHOUT FEVER  NAUSEA AND VOMITING THAT IS NOT CONTROLLED WITH YOUR NAUSEA MEDICATION  *UNUSUAL SHORTNESS OF BREATH  *UNUSUAL BRUISING OR BLEEDING  TENDERNESS IN MOUTH AND THROAT WITH OR WITHOUT PRESENCE OF ULCERS  *URINARY PROBLEMS  *BOWEL PROBLEMS  UNUSUAL RASH Items with * indicate a potential emergency and should be followed up as soon as possible.  Feel free to call the clinic should you have any questions or concerns. The clinic phone number is [phone].  Please show the CHEMO ALERT CARD at check-in to the Emergency Department and triage nurse.   

## 2020-05-09 ENCOUNTER — Inpatient Hospital Stay: Payer: BC Managed Care – PPO

## 2020-05-09 VITALS — BP 130/85 | HR 93 | Temp 97.9°F | Resp 18

## 2020-05-09 DIAGNOSIS — C50411 Malignant neoplasm of upper-outer quadrant of right female breast: Secondary | ICD-10-CM | POA: Diagnosis not present

## 2020-05-09 DIAGNOSIS — Z170 Estrogen receptor positive status [ER+]: Secondary | ICD-10-CM

## 2020-05-09 LAB — T4: T4, Total: 8 ug/dL (ref 4.5–12.0)

## 2020-05-09 MED ORDER — FILGRASTIM-AAFI 480 MCG/0.8ML IJ SOSY
480.0000 ug | PREFILLED_SYRINGE | Freq: Once | INTRAMUSCULAR | Status: AC
  Administered 2020-05-09: 480 ug via SUBCUTANEOUS

## 2020-05-09 NOTE — Patient Instructions (Signed)

## 2020-05-11 ENCOUNTER — Inpatient Hospital Stay: Payer: BC Managed Care – PPO

## 2020-05-11 ENCOUNTER — Other Ambulatory Visit: Payer: Self-pay

## 2020-05-11 VITALS — BP 120/70 | HR 103 | Temp 99.0°F | Resp 18

## 2020-05-11 DIAGNOSIS — C50411 Malignant neoplasm of upper-outer quadrant of right female breast: Secondary | ICD-10-CM

## 2020-05-11 DIAGNOSIS — Z170 Estrogen receptor positive status [ER+]: Secondary | ICD-10-CM

## 2020-05-11 MED ORDER — FILGRASTIM-AAFI 480 MCG/0.8ML IJ SOSY
480.0000 ug | PREFILLED_SYRINGE | Freq: Once | INTRAMUSCULAR | Status: AC
  Administered 2020-05-11: 480 ug via SUBCUTANEOUS
  Filled 2020-05-11: qty 0.8

## 2020-05-11 NOTE — Patient Instructions (Signed)

## 2020-05-12 ENCOUNTER — Other Ambulatory Visit: Payer: Self-pay

## 2020-05-12 ENCOUNTER — Inpatient Hospital Stay: Payer: BC Managed Care – PPO

## 2020-05-12 ENCOUNTER — Inpatient Hospital Stay: Payer: BC Managed Care – PPO | Attending: Oncology

## 2020-05-12 ENCOUNTER — Inpatient Hospital Stay (HOSPITAL_BASED_OUTPATIENT_CLINIC_OR_DEPARTMENT_OTHER): Payer: BC Managed Care – PPO | Admitting: Medical

## 2020-05-12 ENCOUNTER — Other Ambulatory Visit: Payer: Self-pay | Admitting: Oncology

## 2020-05-12 VITALS — BP 107/74 | HR 110 | Resp 18

## 2020-05-12 DIAGNOSIS — Z90710 Acquired absence of both cervix and uterus: Secondary | ICD-10-CM | POA: Insufficient documentation

## 2020-05-12 DIAGNOSIS — Z5189 Encounter for other specified aftercare: Secondary | ICD-10-CM | POA: Diagnosis not present

## 2020-05-12 DIAGNOSIS — Z5111 Encounter for antineoplastic chemotherapy: Secondary | ICD-10-CM | POA: Diagnosis present

## 2020-05-12 DIAGNOSIS — Z7982 Long term (current) use of aspirin: Secondary | ICD-10-CM | POA: Diagnosis not present

## 2020-05-12 DIAGNOSIS — C50411 Malignant neoplasm of upper-outer quadrant of right female breast: Secondary | ICD-10-CM

## 2020-05-12 DIAGNOSIS — Z8041 Family history of malignant neoplasm of ovary: Secondary | ICD-10-CM | POA: Diagnosis not present

## 2020-05-12 DIAGNOSIS — Z171 Estrogen receptor negative status [ER-]: Secondary | ICD-10-CM | POA: Diagnosis not present

## 2020-05-12 DIAGNOSIS — Z5112 Encounter for antineoplastic immunotherapy: Secondary | ICD-10-CM | POA: Insufficient documentation

## 2020-05-12 DIAGNOSIS — Z79899 Other long term (current) drug therapy: Secondary | ICD-10-CM | POA: Insufficient documentation

## 2020-05-12 DIAGNOSIS — Z803 Family history of malignant neoplasm of breast: Secondary | ICD-10-CM | POA: Diagnosis not present

## 2020-05-12 DIAGNOSIS — Z170 Estrogen receptor positive status [ER+]: Secondary | ICD-10-CM

## 2020-05-12 DIAGNOSIS — Z801 Family history of malignant neoplasm of trachea, bronchus and lung: Secondary | ICD-10-CM | POA: Insufficient documentation

## 2020-05-12 DIAGNOSIS — I10 Essential (primary) hypertension: Secondary | ICD-10-CM | POA: Diagnosis not present

## 2020-05-12 DIAGNOSIS — C7951 Secondary malignant neoplasm of bone: Secondary | ICD-10-CM

## 2020-05-12 MED ORDER — SODIUM CHLORIDE 0.9% FLUSH
10.0000 mL | Freq: Once | INTRAVENOUS | Status: AC
  Administered 2020-05-12: 10 mL via INTRAVENOUS
  Filled 2020-05-12: qty 10

## 2020-05-12 MED ORDER — SODIUM CHLORIDE 0.9 % IV SOLN
INTRAVENOUS | Status: DC
  Filled 2020-05-12: qty 250

## 2020-05-12 MED ORDER — HEPARIN SOD (PORK) LOCK FLUSH 100 UNIT/ML IV SOLN
500.0000 [IU] | Freq: Once | INTRAVENOUS | Status: AC
  Administered 2020-05-12: 500 [IU] via INTRAVENOUS
  Filled 2020-05-12: qty 5

## 2020-05-12 MED ORDER — FILGRASTIM-AAFI 480 MCG/0.8ML IJ SOSY
480.0000 ug | PREFILLED_SYRINGE | Freq: Once | INTRAMUSCULAR | Status: AC
  Administered 2020-05-12: 480 ug via SUBCUTANEOUS
  Filled 2020-05-12: qty 0.8

## 2020-05-12 NOTE — Progress Notes (Signed)
Dr. [name] notified of pt low blood pressure this am. Ok to administer injection.

## 2020-05-12 NOTE — Patient Instructions (Signed)
Rehydration, Adult Rehydration is the replacement of body fluids, salts, and minerals (electrolytes) that are lost during dehydration. Dehydration is when there is not enough water or other fluids in the body. This happens when you lose more fluids than you take in. Common causes of dehydration include:  Not drinking enough fluids. This can occur when you are ill or doing activities that require a lot of energy, especially in hot weather.  Conditions that cause loss of water or other fluids, such as diarrhea, vomiting, sweating, or urinating a lot.  Other illnesses, such as fever or infection.  Certain medicines, such as those that remove excess fluid from the body (diuretics). Symptoms of mild or moderate dehydration may include thirst, dry lips and mouth, and dizziness. Symptoms of severe dehydration may include increased heart rate, confusion, fainting, and not urinating. For severe dehydration, you may need to get fluids through an IV at the hospital. For mild or moderate dehydration, you can usually rehydrate at home by drinking certain fluids as told by your health care provider. What are the risks? Generally, rehydration is safe. However, taking in too much fluid (overhydration) can be a problem. This is rare. Overhydration can cause an electrolyte imbalance, kidney failure, or a decrease in salt (sodium) levels in the body. Supplies needed You will need an oral rehydration solution (ORS) if your health care provider tells you to use one. This is a drink to treat dehydration. It can be found in pharmacies and retail stores. How to rehydrate Fluids Follow instructions from your health care provider for rehydration. The kind of fluid and the amount you should drink depend on your condition. In general, you should choose drinks that you prefer.  If told by your health care provider, drink an ORS. ? Make an ORS by following instructions on the package. ? Start by drinking small amounts,  about  cup (120 mL) every 5-10 minutes. ? Slowly increase how much you drink until you have taken the amount recommended by your health care provider.  Drink enough clear fluids to keep your urine pale yellow. If you were told to drink an ORS, finish it first, then start slowly drinking other clear fluids. Drink fluids such as: ? Water. This includes sparkling water and flavored water. Drinking only water can lead to having too little sodium in your body (hyponatremia). Follow the advice of your health care provider. ? Water from ice chips you suck on. ? Fruit juice with water you add to it (diluted). ? Sports drinks. ? Hot or cold herbal teas. ? Broth-based soups. ? Milk or milk products. Food Follow instructions from your health care provider about what to eat while you rehydrate. Your health care provider may recommend that you slowly begin eating regular foods in small amounts.  Eat foods that contain a healthy balance of electrolytes, such as bananas, oranges, potatoes, tomatoes, and spinach.  Avoid foods that are greasy or contain a lot of sugar. In some cases, you may get nutrition through a feeding tube that is passed through your nose and into your stomach (nasogastric tube, or NG tube). This may be done if you have uncontrolled vomiting or diarrhea.   Beverages to avoid Certain beverages may make dehydration worse. While you rehydrate, avoid drinking alcohol.   How to tell if you are recovering from dehydration You may be recovering from dehydration if:  You are urinating more often than before you started rehydrating.  Your urine is pale yellow.  Your energy level   improves.  You vomit less frequently.  You have diarrhea less frequently.  Your appetite improves or returns to normal.  You feel less dizzy or less light-headed.  Your skin tone and color start to look more normal. Follow these instructions at home:  Take over-the-counter and prescription medicines only  as told by your health care provider.  Do not take sodium tablets. Doing this can lead to having too much sodium in your body (hypernatremia). Contact a health care provider if:  You continue to have symptoms of mild or moderate dehydration, such as: ? Thirst. ? Dry lips. ? Slightly dry mouth. ? Dizziness. ? Dark urine or less urine than normal. ? Muscle cramps.  You continue to vomit or have diarrhea. Get help right away if you:  Have symptoms of dehydration that get worse.  Have a fever.  Have a severe headache.  Have been vomiting and the following happens: ? Your vomiting gets worse or does not go away. ? Your vomit includes blood or green matter (bile). ? You cannot eat or drink without vomiting.  Have problems with urination or bowel movements, such as: ? Diarrhea that gets worse or does not go away. ? Blood in your stool (feces). This may cause stool to look black and tarry. ? Not urinating, or urinating only a small amount of very dark urine, within 6-8 hours.  Have trouble breathing.  Have symptoms that get worse with treatment. These symptoms may represent a serious problem that is an emergency. Do not wait to see if the symptoms will go away. Get medical help right away. Call your local emergency services (911 in the [place] Do not drive yourself to the hospital. Summary  Rehydration is the replacement of body fluids and minerals (electrolytes) that are lost during dehydration.  Follow instructions from your health care provider for rehydration. The kind of fluid and amount you should drink depend on your condition.  Slowly increase how much you drink until you have taken the amount recommended by your health care provider.  Contact your health care provider if you continue to show signs of mild or moderate dehydration. This information is not intended to replace advice given to you by your health care provider. Make sure you discuss any questions you have with  your health care provider. Document Revised: 05/01/2019 Document Reviewed: 03/11/2019 [employer] Patient Education  2021 [employer]  

## 2020-05-13 NOTE — Progress Notes (Unsigned)
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 She is receiving treatment with Paclitaxel, Carboplatin, and Pembrolizumab.  She receives the Pembrolizumab and Carboplatin on day 1 of a 21 day cycle, and Paclitaxel on days 1, 8, and 15.  She is here for cycle 2 day 1 of treatment.     INTERVAL HISTORY: [name] 55 y.o. female returns to the clinic today for a follow up visit. The patient is feeling well today without any concerning complaints except for she is curious about a status update on her FMLA for her son which was turned in last Friday. She also is requiring the chemotherapy drug information for [name]. She completed a release of information form at her last appointment. In the interval since her last appointment, she was seen in the [facility] on 05/12/20 for the chief concern of asymptomatic hypotension. She had taken her BP medication that morning. Since that time, she has only been taking a 1/2 a tablet. She is looking to purchase a BP cuff at home so she can monitor her BP. She feels like her breast mass is getting smaller as the soreness is improving.  She denies fevers, chills, or weight loss. She reports her baseline night sweats which has been occurring prior to her diagnosis. She denies cough, shortness of breath, or palpations. She denies nausea, vomiting, diarrhea or constipation. She denies rashes or skin changes except her eczema on her right forearm for which she has cream for.. She denies changes with her bladder habits. She denies peripheral neuropathy. She is here for evaluation before starting cycle #2 day 1 today.   COVID 19 VACCINATION STATUS: fully vaccinated Therapist, music), with booster   HISTORY OF CURRENT ILLNESS: From the original intake note:  [name] had routine screening mammography showing a possible  abnormality in the right breast and a prominent intramammary lymph node. She underwent right diagnostic mammography with tomography and right breast ultrasonography at [name] on 03/26/2020 showing: breast density category B; palpable 1.8 cm right breast mass at 10 o'clock, which feels much larger than what is visualized sonographically; suspicious 0.6 cm intramammary lymph node in right breast at 10 o'clock; at least 3 morphologically abnormal lymph nodes in right axilla.  Accordingly on 03/27/2020 she proceeded to biopsy of the right breast areas in question. The pathology from this procedure ([id number]) showed:  1. Right Breast, 10 o'clock (mass)             - invasive mammary carcinoma, e-cadherin positive with a lobular growth pattern, grade 2             - mammary carcinoma in situ             - Prognostic indicators significant for: estrogen receptor, 1% positive and progesterone receptor, 1% positive, both with strong staining intensity. Proliferation marker Ki67 at 30%. HER2 negative by immunohistochemistry (0). 2. Right Breast, 10 o'clock (intramammary node)             - mammary carcinoma involving lymph node and perinodal soft tissue             -  Prognostic indicators significant for: estrogen receptor, 1% positive with strong staining intensity and progesterone receptor, 0% negative. Proliferation marker Ki67 at 40%. HER2 negative by immunohistochemistry (0).   MEDICAL HISTORY: Past Medical History:  Diagnosis Date  .  Allergy   . Arthritis 2018   hands, left knee  . Breast cancer ([place]) 03-27-2020   right breast IMC  . Eczema   . Family history of breast cancer   . Family history of lung cancer   . Family history of prostate cancer   . Hypertension     ALLERGIES:  has No Known Allergies.  MEDICATIONS:  Current Outpatient Medications  Medication Sig Dispense Refill  . acetaminophen (TYLENOL) 500 MG tablet Take 1,000 mg by mouth every 6 (six) hours as needed for  moderate pain.    . Boric Acid Vaginal 600 MG SUPP Place 600 mg vaginally daily as needed (yeast infections).    . Cholecalciferol (VITAMIN D) 125 MCG (5000 UT) CAPS Take 5,000 Units by mouth daily.    . clobetasol ointment (TEMOVATE) 1.57 % Apply 1 application topically 2 (two) times daily as needed (rash/itching).    . Cyanocobalamin (B-12) 5000 MCG CAPS Take 5,000 mcg by mouth daily.    Marland Kitchen lidocaine-prilocaine (EMLA) cream Apply to affected area once (Patient taking differently: Apply 1 application topically daily as needed (port access).) 30 g 3  . loratadine (CLARITIN) 10 MG tablet Take 10 mg by mouth daily.    Marland Kitchen olmesartan-hydrochlorothiazide (BENICAR HCT) 40-25 MG tablet Take 1 tablet by mouth every morning.    . prochlorperazine (COMPAZINE) 10 MG tablet Take 1 tablet (10 mg total) by mouth every 6 (six) hours as needed (Nausea or vomiting). 30 tablet 1  . traMADol (ULTRAM) 50 MG tablet Take 2 tablets (100 mg total) by mouth every 6 (six) hours as needed. 10 tablet 0  . aspirin EC 81 MG tablet Take 81 mg by mouth daily. Swallow whole. (Patient not taking: Reported on 05/15/2020)    . loperamide (IMODIUM A-D) 2 MG tablet Take 2 mg by mouth 4 (four) times daily as needed for diarrhea or loose stools. (Patient not taking: No sig reported)     No current facility-administered medications for this visit.   Facility-Administered Medications Ordered in Other Visits  Medication Dose Route Frequency Provider Last Rate Last Admin  . CARBOplatin (PARAPLATIN) 750 mg in sodium chloride 0.9 % 250 mL chemo infusion  750 mg Intravenous Once Magrinat, Virgie Dad, MD      . dexamethasone (DECADRON) 10 mg in sodium chloride 0.9 % 50 mL IVPB  10 mg Intravenous Once Magrinat, Virgie Dad, MD      . diphenhydrAMINE (BENADRYL) injection 25 mg  25 mg Intravenous Once Magrinat, Virgie Dad, MD      . famotidine (PEPCID) IVPB 20 mg premix  20 mg Intravenous Once Magrinat, Virgie Dad, MD      . fosaprepitant (EMEND) 150 mg in  sodium chloride 0.9 % 145 mL IVPB  150 mg Intravenous Once Magrinat, Virgie Dad, MD      . heparin lock flush 100 unit/mL  500 Units Intracatheter Once PRN Magrinat, Virgie Dad, MD      . PACLitaxel (TAXOL) 174 mg in sodium chloride 0.9 % 250 mL chemo infusion (</= 77m/m2)  80 mg/m2 (Treatment Plan Recorded) Intravenous Once Magrinat, GVirgie Dad MD      . palonosetron (ALOXI) injection 0.25 mg  0.25 mg Intravenous Once Magrinat, GVirgie Dad MD      . pembrolizumab ([facility] 200 mg in sodium chloride 0.9 % 50 mL chemo infusion  200 mg Intravenous Once Magrinat, GVirgie Dad MD      . sodium chloride flush (NS) 0.9 % injection 10 mL  10 mL  Intracatheter PRN Magrinat, Virgie Dad, MD        SURGICAL HISTORY:  Past Surgical History:  Procedure Laterality Date  . ABDOMINAL HYSTERECTOMY    . CESAREAN SECTION    . KNEE ARTHROSCOPY W/ ACL RECONSTRUCTION Left   . PORTACATH PLACEMENT Left 04/23/2020   Procedure: LEFT SIDE PORT PLACEMENT WITH ULTRASOUND GUIDANCE;  Surgeon: Rolm Bookbinder, MD;  Location: [facility];  Service: General;  Laterality: Left;  230PM START TIME PLEASE ROOM 2 THANKS  . TUBAL LIGATION      REVIEW OF SYSTEMS:   Review of Systems  Constitutional: Negative for appetite change, chills, fatigue, fever and unexpected weight change.  HENT: Negative for mouth sores, nosebleeds, sore throat and trouble swallowing.   Eyes: Negative for eye problems and icterus.  Respiratory: Negative for cough, hemoptysis, shortness of breath and wheezing.   Cardiovascular: Negative for chest pain and leg swelling.  Gastrointestinal: Negative for abdominal pain, constipation, diarrhea, nausea and vomiting.  Genitourinary: Negative for bladder incontinence, difficulty urinating, dysuria, frequency and hematuria.   Musculoskeletal: Negative for back pain, gait problem, neck pain and neck stiffness.  Skin: Positive for eczema.  Neurological: Negative for dizziness, extremity weakness, gait  problem, headaches, light-headedness and seizures.  Hematological: Negative for adenopathy. Does not bruise/bleed easily.  Psychiatric/Behavioral: Negative for confusion, depression and sleep disturbance. The patient is not nervous/anxious.     PHYSICAL EXAMINATION:  Blood pressure 118/70, pulse 87, temperature (!) 97 F (36.1 C), temperature source Tympanic, resp. rate 16, height '5\' 9"'  (1.753 m), weight 216 lb 12.8 oz (98.3 kg), SpO2 100 %.  ECOG PERFORMANCE STATUS: 1 - Symptomatic but completely ambulatory  Physical Exam  Constitutional: Oriented to person, place, and time and well-developed, well-nourished, and in no distress. HENT:  Head: Normocephalic and atraumatic.  Mouth/Throat: Oropharynx is clear and moist. No oropharyngeal exudate.  Eyes: Conjunctivae are normal. Right eye exhibits no discharge. Left eye exhibits no discharge. No scleral icterus.  Neck: Normal range of motion. Neck supple.  Cardiovascular: Normal rate, regular rhythm, normal heart sounds and intact distal pulses.   Pulmonary/Chest: Effort normal and breath sounds normal. No respiratory distress. No wheezes. No rales.  Abdominal: Soft. Bowel sounds are normal. Exhibits no distension and no mass. There is no tenderness.  Musculoskeletal: Normal range of motion. Exhibits no edema.  Lymphadenopathy:    No cervical adenopathy.  Neurological: Alert and oriented to person, place, and time. Exhibits normal muscle tone. Gait normal. Coordination normal.  Skin: Skin is warm and dry. No rash noted. Not diaphoretic. No erythema. No pallor.  Psychiatric: Mood, memory and judgment normal.  Breast: Right breast mass noted in the right upper quadrant without overlying skin or nipple involvement. The left breast is benign.  Vitals reviewed.  LABORATORY DATA: Lab Results  Component Value Date   WBC 8.8 05/15/2020   HGB 9.4 (L) 05/15/2020   HCT 29.5 (L) 05/15/2020   MCV 89.7 05/15/2020   PLT 209 05/15/2020       Chemistry      Component Value Date/Time   NA 136 05/15/2020 0916   K 3.6 05/15/2020 0916   CL 106 05/15/2020 0916   CO2 22 05/15/2020 0916   BUN 17 05/15/2020 0916   CREATININE 0.76 05/15/2020 0916   CREATININE 0.69 09/24/2013 0819      Component Value Date/Time   CALCIUM 9.2 05/15/2020 0916   ALKPHOS 143 (H) 05/15/2020 0916   AST 15 05/15/2020 0916   ALT 19 05/15/2020 0916  BILITOT <0.2 (L) 05/15/2020 0916       RADIOGRAPHIC STUDIES:  DG Chest 1 View  Result Date: 04/23/2020 CLINICAL DATA:  Chest port placement EXAM: DG C-ARM 1-60 MIN; CHEST  1 VIEW FLUOROSCOPY TIME:  Fluoroscopy Time:  20.7 seconds Radiation Exposure Index (if provided by the fluoroscopic device): 2.42 mGy Number of Acquired Spot Images: 1 COMPARISON:  None. FINDINGS: Single fluoroscopic image was obtained during the performance of the procedure and is provided for interpretation only. Left chest wall port via internal jugular approach tip overlies superior vena cava. Visualized portions of the left chest are clear. IMPRESSION: 1. Left chest wall port as above. Please refer to the operative report. Electronically Signed   By: Randa Ngo M.D.   On: 04/23/2020 15:40   CT Chest W Contrast  Result Date: 04/20/2020 CLINICAL DATA:  Breast cancer staging, new breast cancer diagnosis. 55 year old female with new diagnosis of breast cancer in January 2022, planning for immuno and chemotherapy. EXAM: CT CHEST WITH CONTRAST TECHNIQUE: Multidetector CT imaging of the chest was performed during intravenous contrast administration. CONTRAST:  67m OMNIPAQUE IOHEXOL 300 MG/ML  SOLN COMPARISON:  None FINDINGS: Cardiovascular: Normal caliber thoracic aorta. Heart size top normal. No pericardial effusion. Central pulmonary vasculature unremarkable on venous phase normal caliber. Mediastinum/Nodes: RIGHT axillary adenopathy. Largest lymph node measuring 1.7 x 1.9 cm (image 39, series 2) Lymph nodes track towards subpectoral nodal  stations (image 26, series 2) 11 mm lymph node with smaller lymph nodes with rounded morphologic features tracking beneath the RIGHT pectoral musculature best seen on image 21 of series 2. LEFT thoracic inlet with numerous small lymph nodes suspicious given other findings tracking into the LEFT low neck (image 14, series 2 6 mm and image 7 of series 2 7 mm. Thyroid grossly normal. No LEFT axillary lymphadenopathy the Known breast mass may be indicated by asymmetric density in the RIGHT breast as compared to the LEFT with biopsy clips in the area of the upper outer RIGHT breast. Internal mammary nodal prominence on the RIGHT 4 mm on image 37 of series 2 No hilar or mediastinal lymphadenopathy.  Esophagus mildly patulous. Lungs and pleura: Basilar atelectasis. Chest wall mass, see below. Pleural nodularity in the LEFT lung base at 9 mm on image 94 of series 7 airways are patent. Upper Abdomen: Imaged portions of liver, gallbladder, pancreas, spleen and adrenal glands are normal. Kidneys in the upper poles enhance symmetrically. No acute gastrointestinal process. Musculoskeletal: Destructive lesion involving the RIGHT posterior ninth rib measures 2.2 x 1.5 cm and is associated with subacute appearing pathologic fracture. Multifocal lytic changes seen in the ribs and in the spine. Lytic process in the RIGHT second rib (image 14, series 2) third rib with lytic and sclerotic changes on image 24 of series 2 also on the RIGHT. Similar involvement of the RIGHT fourth rib and sixth rib Subtle lytic change in the LEFT posterior fifth rib on image 48 of series 2. Multilevel spinal involvement, lytic and sclerotic changes at at multiple levels, essentially every visualized level of the spine. Example of lytic changes seen at T12 violating the posterior cortex along the LEFT posterolateral vertebral body with central canal soft tissue. Another lytic focus with central soft tissue on image 99 of series 2 measuring 16 mm at thew T9  level as another example of this process. Loss of height at T6 appears more likely to be chronic but is age indeterminate. IMPRESSION: 1. Soft tissue in the RIGHT breast presumably at  the site of known breast mass, associated with axillary adenopathy and widespread bony lesions with both lytic and sclerotic changes involving nearly every level of the spine and with destruction and soft tissue about the RIGHT ninth rib posteriorly. 2. Potential LEFT thoracic inlet adenopathy as well. PET scan may be helpful for complete staging in this patient. 3. Given extensive spinal involvement and posterior cortical disruption and soft tissue extending in the central canal at the T11 level, spinal MRI may also be helpful. 4. T6 compression appears more likely to be chronic. Correlate with any symptoms in this location, approximately 40% loss of height at this level. 5. Pleural nodularity versus focal atelectasis in the LEFT lung base, attention on follow-up. 6. Aortic atherosclerosis. These results will be called to the ordering clinician or representative by the Radiologist Assistant, and communication documented in the PACS or [employer]. Aortic Atherosclerosis (ICD10-I70.0). Electronically Signed   By: Zetta Bills M.D.   On: 04/20/2020 11:42   MR THORACIC SPINE W WO CONTRAST  Result Date: 04/30/2020 CLINICAL DATA:  Metastatic breast cancer. Evaluate for spinal cord compression. EXAM: MRI THORACIC WITHOUT AND WITH CONTRAST TECHNIQUE: Multiplanar and multiecho pulse sequences of the thoracic spine were obtained without and with intravenous contrast. CONTRAST:  64m GADAVIST GADOBUTROL 1 MMOL/ML IV SOLN COMPARISON:  Chest CT 04/20/2020, bone scan 04/20/2020 FINDINGS: Alignment:  Physiologic. Vertebrae: Chronic T6 vertebral body compression fracture with approximately 20% anterior height loss. Remainder the vertebral body heights are maintained. No acute fracture. No discitis or osteomyelitis. Abnormal bone lesions  throughout the thoracic spine vertebral bodies and lower cervical spine vertebral bodies and posterior elements consistent with metastatic disease. Abnormal bone lesion involving the left T8 pedicle and right T6 pedicle. No epidural soft tissue component. Abnormal bone lesions scattered throughout the visualized portions of the ribs with a destructive bone lesion with the large soft tissue component involving the right posterior ninth rib. Cord:  Normal signal and morphology. Paraspinal and other soft tissues: No acute paraspinal abnormality. Disc levels: No significant thoracic spine disc protrusion, foraminal stenosis or central canal stenosis. IMPRESSION: 1. 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 No epidural soft tissue component. No cord compression. Electronically Signed   By: HKathreen Devoid  On: 04/30/2020 10:52   NM Bone Scan Whole Body  Result Date: 04/21/2020 CLINICAL DATA:  Breast cancer, staging new breast cancer staging prior to chemo/metastatic lymph node EXAM: NUCLEAR MEDICINE WHOLE BODY BONE SCAN TECHNIQUE: Whole body anterior and posterior images were obtained approximately 3 hours after intravenous injection of radiopharmaceutical. RADIOPHARMACEUTICALS:  20.9 mCi Technetium-968mDP IV COMPARISON:  CT 04/20/2020 FINDINGS: Along the inferior medial border of the RIGHT scapula there is a rim of intense radiotracer activity which corresponds to a mixed lytic lesion on CT. Focal uptake posterior RIGHT ninth rib corresponds to pathologic fracture on comparison CT On comparison CT there several small sclerotic lesions in the spine as well as a lytic lesion at T9. These are not well appreciated on the bone scan. There is intense uptake associated with the LEFT knee involving the femoral condyles and tibial plateau. Tiny focus of uptake in the midshaft LEFT tibia. IMPRESSION: 1. Multifocal skeletal metastasis corresponds to findings on comparison CT chest. 2.  Uptake in the LEFT knee is favored severe degenerative arthropathy. Electronically Signed   By: StSuzy Bouchard.D.   On: 04/21/2020 07:42   MR BREAST BILATERAL W WO CONTRAST INC CAD  Result Date: 04/17/2020  CLINICAL DATA:  Recently diagnosed invasive mammary carcinoma and mammary carcinoma in situ in the 10 o'clock position of the right breast. Recently diagnosed metastatic intramammary lymph node in the 10 o'clock position of the right breast. Recently diagnosed metastatic right axillary adenopathy. LABS:  None obtained on site today. EXAM: BILATERAL BREAST MRI WITH AND WITHOUT CONTRAST TECHNIQUE: Multiplanar, multisequence MR images of both breasts were obtained prior to and following the intravenous administration of 10 ml of Gadavist Three-dimensional MR images were rendered by post-processing of the original MR data on an independent workstation. The three-dimensional MR images were interpreted, and findings are reported in the following complete MRI report for this study. Three dimensional images were evaluated at the independent interpreting workstation using the DynaCAD thin client. COMPARISON:  Bilateral screening mammogram dated 02/25/2020, right diagnostic mammogram and right breast ultrasound dated 03/26/2020 and subsequent right breast and right axillary ultrasound-guided core needle biopsies. FINDINGS: Breast composition: b. Scattered fibroglandular tissue. Background parenchymal enhancement: Mild. Right breast: Irregular, spiculated enhancing mass in the 10 o'clock position of the right breast, middle depth, containing a biopsy marker clip artifact. This measures 4.5 x 2.9 cm on image number 51 series 16 and 1.9 cm in cephalocaudal dimension in the sagittal plane. This has predominantly persistent enhancement kinetics with some plateau and rapid wash-in/washout kinetics. There is associated patchy non mass enhancement extending anteriorly and posteriorly with linear extensions, spanning 9.2 x  3.1 cm on image number 54 series 16. This is also in the upper outer quadrant and extends from the superior retroareolar region to the pectoralis major muscle with mild patchy enhancement in outer portions of the pectoralis major muscle on the right as well as edema in the muscle. Anteriorly, the non mass enhancement extends into the anterior aspect of the upper inner quadrant of the breast and is somewhat more focal and confluent in that area. This also has predominantly persistent enhancement kinetics with some plateau and rapid wash-in/washout kinetics. The recently biopsied intramammary lymph node is within the area of patchy non mass enhancement in the upper-outer quadrant of the right breast, slightly posteriorly. This contains a biopsy marker clip artifact laterally and has predominantly rapid wash-in/washout enhancement kinetics. Left breast: Nodular background parenchymal enhancement. No mass or abnormal enhancement suspicious for malignancy. Lymph nodes: There are at least 12 abnormally enlarged right axillary lymph nodes with abnormal morphology, including the biopsied lymph node, containing a biopsy marker clip artifact in the inferior axilla. Two of the enlarged lymph nodes are retropectoral nodes in the infraclavicular region. No enlarged internal mammary lymph nodes are seen and there is no adenopathy on the left. Ancillary findings: 8 mm enhancing mass in the inferior sternum on the left. There are also several small rounded and somewhat patchy areas of enhancement in the manubrium on the left, near the sternoclavicular joint. A 3 mm enhancing nodule is demonstrated in the anterior right middle lobe medially on image number 31 series 5. IMPRESSION: 1. 4.5 x 2.9 x 1.9 cm area of biopsy-proven invasive mammary carcinoma and mammary carcinoma in situ in the upper-outer quadrant of the right breast and extending into the anterior aspect of the upper inner quadrant of the right breast. This extends from  the superior retroareolar region posteriorly to the level of the pectoralis major muscle laterally. 2. Edema and mild patchy enhancement in the lateral aspect of the pectoralis muscle on the right. This could represent post biopsy changes or small areas of tumor invasion. 3. Biopsy-proven metastatic intramammary lymph node  within the patchy non mass enhancement in the upper-outer quadrant of the right breast, slightly posteriorly. 4. At least 12 metastatic right axillary lymph nodes, including 2 retropectoral nodes in the infraclavicular region. 5. 8 mm enhancing sternal mass on the left and several smaller enhancing masses in the manubrium on the left. These are suspicious for the possibility of bony metastatic disease. 6. 3 mm right middle lobe nodule. This is nonspecific and of low suspicion for malignancy or metastatic disease. RECOMMENDATION: Treatment plan. BI-RADS CATEGORY  6: Known biopsy-proven malignancy. Electronically Signed   By: Claudie Revering M.D.   On: 04/17/2020 08:45   DG C-Arm 1-60 Min  Result Date: 04/23/2020 CLINICAL DATA:  Chest port placement EXAM: DG C-ARM 1-60 MIN; CHEST  1 VIEW FLUOROSCOPY TIME:  Fluoroscopy Time:  20.7 seconds Radiation Exposure Index (if provided by the fluoroscopic device): 2.42 mGy Number of Acquired Spot Images: 1 COMPARISON:  None. FINDINGS: Single fluoroscopic image was obtained during the performance of the procedure and is provided for interpretation only. Left chest wall port via internal jugular approach tip overlies superior vena cava. Visualized portions of the left chest are clear. IMPRESSION: 1. Left chest wall port as above. Please refer to the operative report. Electronically Signed   By: Randa Ngo M.D.   On: 04/23/2020 15:40     ASSESSMENT: 55 y.o. [place] woman status post right breast upper outer quadrant biopsy 03/27/2020 for a clinical T1c N3 M1, stageIV invasive ductal carcinoma, functionally triple negative, with an MIB-1 of 30-40%              (a) chest CT scan 04/20/2020 shows in addition to the breast mass and regional adenopathy, lytic and sclerotic bone changes, possible left thoracic adenopathy, and indeterminant left pleural nodularity             (b) bone scan 04/21/2020 confirms multiple bone lesions             (c) thoracic MRI  (1) genetics testing which was negative  (2) neoadjuvant chemotherapy will consist of pembrolizumab every 3 weeks, carboplatin every 3 weeks x 4 with paclitaxel weekly x12, then possibly followed by doxorubicin and cyclophosphamide every 3 weeks x 4  (3) zoledronate to start 05/08/2020, repeated every 12 weeks  (4) definitive surgery to follow  (5) adjuvant radiation  Plan: This is a very pleasant 55 year old African American female with 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 She receives Pembrolizumab and Carboplatin on day 1 of a 21 day cycle, and Paclitaxel on days 1, 8, and 15.  She is here for cycle 2 day 1 of treatment.    She is feeling well today. She received her first dose of zometa last week. Labs were reviewed with the patient which are adequate for treatment today. Recommend that she proceed with cycle #2 day 1 today as scheduled.   We will see her back for a follow up visit in 3 weeks for day 1 of cycle #3. She also is missing a few appointments for her injections. I have requested scheduling continue to add more appointments.   She is going to buy a BP cuff and monitor her BP at home. Advised her to reach out to her prescribing provider for her BP medications to make them aware of her BP and to make appropriate medication adjustments. For now, she has been taking a 1/2 a tablet of her anti-hypertensive with good control.   I provided the patient with a copy of  her last office note which contains the treatment plan for [facility]. I also discussed that it may take 7-10 days for the [facility] paperwork to be completed.   The patient was advised to call immediately if she has any  concerning symptoms in the interval. The patient voices understanding of current disease status and treatment options and is in agreement with the current care plan. All questions were answered. The patient knows to call the clinic with any problems, questions or concerns. We can certainly see the patient much sooner if necessary  No orders of the defined types were placed in this encounter.    I spent 20-29 minutes in this encounter.   Cassandra L Heilingoetter, PA-C 05/15/20

## 2020-05-14 ENCOUNTER — Other Ambulatory Visit: Payer: Self-pay | Admitting: Oncology

## 2020-05-14 NOTE — Progress Notes (Signed)
The patient was seen in the infusion room today as she was receiving IV fluids.  She had presented for a shot and was found to have a blood pressure of 86/50.  She is asymptomatic.  She was begun on IV fluids and was noted to have a blood pressure improved to 101/63.  She denied any other issues or concerns.  She denied fevers, chills, sweats, nausea, vomiting, constipation, or diarrhea.  No other intervention was indicated.  Sandi Mealy, MHS, PA-C Physician Assistant

## 2020-05-15 ENCOUNTER — Inpatient Hospital Stay: Payer: BC Managed Care – PPO

## 2020-05-15 ENCOUNTER — Encounter: Payer: Self-pay | Admitting: Physician Assistant

## 2020-05-15 ENCOUNTER — Other Ambulatory Visit: Payer: Self-pay

## 2020-05-15 ENCOUNTER — Inpatient Hospital Stay: Payer: BC Managed Care – PPO | Admitting: Physician Assistant

## 2020-05-15 VITALS — BP 118/70 | HR 87 | Temp 97.0°F | Resp 16 | Ht 69.0 in | Wt 216.8 lb

## 2020-05-15 DIAGNOSIS — Z170 Estrogen receptor positive status [ER+]: Secondary | ICD-10-CM | POA: Diagnosis not present

## 2020-05-15 DIAGNOSIS — C50411 Malignant neoplasm of upper-outer quadrant of right female breast: Secondary | ICD-10-CM

## 2020-05-15 DIAGNOSIS — C7951 Secondary malignant neoplasm of bone: Secondary | ICD-10-CM | POA: Diagnosis not present

## 2020-05-15 DIAGNOSIS — Z95828 Presence of other vascular implants and grafts: Secondary | ICD-10-CM

## 2020-05-15 LAB — CBC WITH DIFFERENTIAL (CANCER CENTER ONLY)
Abs Immature Granulocytes: 0.37 10*3/uL — ABNORMAL HIGH (ref 0.00–0.07)
Basophils Absolute: 0 10*3/uL (ref 0.0–0.1)
Basophils Relative: 1 %
Eosinophils Absolute: 0.2 10*3/uL (ref 0.0–0.5)
Eosinophils Relative: 2 %
HCT: 29.5 % — ABNORMAL LOW (ref 36.0–46.0)
Hemoglobin: 9.4 g/dL — ABNORMAL LOW (ref 12.0–15.0)
Immature Granulocytes: 4 %
Lymphocytes Relative: 26 %
Lymphs Abs: 2.3 10*3/uL (ref 0.7–4.0)
MCH: 28.6 pg (ref 26.0–34.0)
MCHC: 31.9 g/dL (ref 30.0–36.0)
MCV: 89.7 fL (ref 80.0–100.0)
Monocytes Absolute: 0.8 10*3/uL (ref 0.1–1.0)
Monocytes Relative: 9 %
Neutro Abs: 5.2 10*3/uL (ref 1.7–7.7)
Neutrophils Relative %: 58 %
Platelet Count: 209 10*3/uL (ref 150–400)
RBC: 3.29 MIL/uL — ABNORMAL LOW (ref 3.87–5.11)
RDW: 13 % (ref 11.5–15.5)
WBC Count: 8.8 10*3/uL (ref 4.0–10.5)
nRBC: 0 % (ref 0.0–0.2)

## 2020-05-15 LAB — TSH: TSH: 1.006 u[IU]/mL (ref 0.308–3.960)

## 2020-05-15 LAB — CMP (CANCER CENTER ONLY)
ALT: 19 U/L (ref 10–47)
AST: 15 U/L (ref 11–38)
Albumin: 3.8 g/dL (ref 3.5–5.0)
Alkaline Phosphatase: 143 U/L — ABNORMAL HIGH (ref 38–126)
Anion gap: 8 (ref 5–15)
BUN: 17 mg/dL (ref 6–20)
CO2: 22 mmol/L (ref 22–32)
Calcium: 9.2 mg/dL (ref 8.9–10.3)
Chloride: 106 mmol/L (ref 98–111)
Creatinine: 0.76 mg/dL (ref 0.60–1.20)
GFR, Estimated: >60 mL/min (ref >60)
Glucose, Bld: 127 mg/dL — ABNORMAL HIGH (ref 70–99)
Potassium: 3.6 mmol/L (ref 3.5–5.1)
Sodium: 136 mmol/L (ref 135–145)
Total Bilirubin: <0.2 mg/dL — ABNORMAL LOW (ref 0.2–1.6)
Total Protein: 7.2 g/dL (ref 6.5–8.1)

## 2020-05-15 MED ORDER — SODIUM CHLORIDE 0.9 % IV SOLN
Freq: Once | INTRAVENOUS | Status: AC
  Filled 2020-05-15: qty 250

## 2020-05-15 MED ORDER — PALONOSETRON HCL INJECTION 0.25 MG/5ML
0.2500 mg | Freq: Once | INTRAVENOUS | Status: AC
  Administered 2020-05-15: 0.25 mg via INTRAVENOUS

## 2020-05-15 MED ORDER — PEMBROLIZUMAB CHEMO INJECTION 100 MG/4ML
200.0000 mg | Freq: Once | INTRAVENOUS | Status: AC
  Administered 2020-05-15: 200 mg via INTRAVENOUS
  Filled 2020-05-15: qty 8

## 2020-05-15 MED ORDER — SODIUM CHLORIDE 0.9 % IV SOLN
80.0000 mg/m2 | Freq: Once | INTRAVENOUS | Status: AC
  Administered 2020-05-15: 174 mg via INTRAVENOUS
  Filled 2020-05-15: qty 29

## 2020-05-15 MED ORDER — SODIUM CHLORIDE 0.9% FLUSH
10.0000 mL | INTRAVENOUS | Status: DC | PRN
  Administered 2020-05-15: 10 mL via INTRAVENOUS
  Filled 2020-05-15: qty 10

## 2020-05-15 MED ORDER — SODIUM CHLORIDE 0.9 % IV SOLN
10.0000 mg | Freq: Once | INTRAVENOUS | Status: AC
  Administered 2020-05-15: 10 mg via INTRAVENOUS
  Filled 2020-05-15: qty 10

## 2020-05-15 MED ORDER — SODIUM CHLORIDE 0.9% FLUSH
10.0000 mL | INTRAVENOUS | Status: DC | PRN
  Administered 2020-05-15: 10 mL
  Filled 2020-05-15: qty 10

## 2020-05-15 MED ORDER — SODIUM CHLORIDE 0.9 % IV SOLN
150.0000 mg | Freq: Once | INTRAVENOUS | Status: AC
  Administered 2020-05-15: 150 mg via INTRAVENOUS
  Filled 2020-05-15: qty 150

## 2020-05-15 MED ORDER — DIPHENHYDRAMINE HCL 50 MG/ML IJ SOLN
25.0000 mg | Freq: Once | INTRAMUSCULAR | Status: AC
  Administered 2020-05-15: 25 mg via INTRAVENOUS

## 2020-05-15 MED ORDER — HEPARIN SOD (PORK) LOCK FLUSH 100 UNIT/ML IV SOLN
500.0000 [IU] | Freq: Once | INTRAVENOUS | Status: AC | PRN
  Administered 2020-05-15: 500 [IU]
  Filled 2020-05-15: qty 5

## 2020-05-15 MED ORDER — PALONOSETRON HCL INJECTION 0.25 MG/5ML
INTRAVENOUS | Status: AC
  Filled 2020-05-15: qty 5

## 2020-05-15 MED ORDER — FAMOTIDINE IN NACL 20-0.9 MG/50ML-% IV SOLN
INTRAVENOUS | Status: AC
  Filled 2020-05-15: qty 50

## 2020-05-15 MED ORDER — FAMOTIDINE IN NACL 20-0.9 MG/50ML-% IV SOLN
20.0000 mg | Freq: Once | INTRAVENOUS | Status: AC
Start: 2020-05-15 — End: 2020-05-15
  Administered 2020-05-15: 20 mg via INTRAVENOUS

## 2020-05-15 MED ORDER — CARBOPLATIN CHEMO INJECTION 600 MG/60ML
750.0000 mg | Freq: Once | INTRAVENOUS | Status: AC
Start: 2020-05-15 — End: 2020-05-15
  Administered 2020-05-15: 750 mg via INTRAVENOUS
  Filled 2020-05-15: qty 75

## 2020-05-15 MED ORDER — DIPHENHYDRAMINE HCL 50 MG/ML IJ SOLN
INTRAMUSCULAR | Status: AC
  Filled 2020-05-15: qty 1

## 2020-05-15 NOTE — Patient Instructions (Signed)
[facility] °Discharge Instructions for Patients Receiving Chemotherapy ° °Today you received the following chemotherapy agents taxol; carboplatin ° °To help prevent nausea and vomiting after your treatment, we encourage you to take your nausea medication as directed °  °If you develop nausea and vomiting that is not controlled by your nausea medication, call the clinic.  ° °BELOW ARE SYMPTOMS THAT SHOULD BE REPORTED IMMEDIATELY: °· *FEVER GREATER THAN 100.5 F °· *CHILLS WITH OR WITHOUT FEVER °· NAUSEA AND VOMITING THAT IS NOT CONTROLLED WITH YOUR NAUSEA MEDICATION °· *UNUSUAL SHORTNESS OF BREATH °· *UNUSUAL BRUISING OR BLEEDING °· TENDERNESS IN MOUTH AND THROAT WITH OR WITHOUT PRESENCE OF ULCERS °· *URINARY PROBLEMS °· *BOWEL PROBLEMS °· UNUSUAL RASH °Items with * indicate a potential emergency and should be followed up as soon as possible. ° °Feel free to call the clinic should you have any questions or concerns. The clinic phone number is [phone]. ° °Please show the CHEMO ALERT CARD at check-in to the Emergency Department and triage nurse. ° ° °

## 2020-05-15 NOTE — Patient Instructions (Signed)
Implanted Port Insertion, Care After This sheet gives you information about how to care for yourself after your procedure. Your health care provider may also give you more specific instructions. If you have problems or questions, contact your health care provider. What can I expect after the procedure? After the procedure, it is common to have:  Discomfort at the port insertion site.  Bruising on the skin over the port. This should improve over 3-4 days. Follow these instructions at home: Port care  After your port is placed, you will get a manufacturer's information card. The card has information about your port. Keep this card with you at all times.  Take care of the port as told by your health care provider. Ask your health care provider if you or a family member can get training for taking care of the port at home. A home health care nurse may also take care of the port.  Make sure to remember what type of port you have. Incision care  Follow instructions from your health care provider about how to take care of your port insertion site. Make sure you: ? Wash your hands with soap and water before and after you change your bandage (dressing). If soap and water are not available, use hand sanitizer. ? Change your dressing as told by your health care provider. ? Leave stitches (sutures), skin glue, or adhesive strips in place. These skin closures may need to stay in place for 2 weeks or longer. If adhesive strip edges start to loosen and curl up, you may trim the loose edges. Do not remove adhesive strips completely unless your health care provider tells you to do that.  Check your port insertion site every day for signs of infection. Check for: ? Redness, swelling, or pain. ? Fluid or blood. ? Warmth. ? Pus or a bad smell.      Activity  Return to your normal activities as told by your health care provider. Ask your health care provider what activities are safe for you.  Do not  lift anything that is heavier than 10 lb (4.5 kg), or the limit that you are told, until your health care provider says that it is safe. General instructions  Take over-the-counter and prescription medicines only as told by your health care provider.  Do not take baths, swim, or use a hot tub until your health care provider approves. Ask your health care provider if you may take showers. You may only be allowed to take sponge baths.  Do not drive for 24 hours if you were given a sedative during your procedure.  Wear a medical alert bracelet in case of an emergency. This will tell any health care providers that you have a port.  Keep all follow-up visits as told by your health care provider. This is important. Contact a health care provider if:  You cannot flush your port with saline as directed, or you cannot draw blood from the port.  You have a fever or chills.  You have redness, swelling, or pain around your port insertion site.  You have fluid or blood coming from your port insertion site.  Your port insertion site feels warm to the touch.  You have pus or a bad smell coming from the port insertion site. Get help right away if:  You have chest pain or shortness of breath.  You have bleeding from your port that you cannot control. Summary  Take care of the port as told by your   health care provider. Keep the manufacturer's information card with you at all times.  Change your dressing as told by your health care provider.  Contact a health care provider if you have a fever or chills or if you have redness, swelling, or pain around your port insertion site.  Keep all follow-up visits as told by your health care provider. This information is not intended to replace advice given to you by your health care provider. Make sure you discuss any questions you have with your health care provider. Document Revised: 09/26/2017 Document Reviewed: 09/26/2017 Elsevier Patient Education   2021 [employer] Inc[employer]  

## 2020-05-16 LAB — T4: T4, Total: 8.9 ug/dL (ref 4.5–12.0)

## 2020-05-22 ENCOUNTER — Telehealth: Payer: Self-pay | Admitting: Oncology

## 2020-05-22 ENCOUNTER — Inpatient Hospital Stay: Payer: BC Managed Care – PPO

## 2020-05-22 ENCOUNTER — Other Ambulatory Visit: Payer: Self-pay

## 2020-05-22 VITALS — BP 135/78 | HR 85 | Temp 97.8°F | Resp 16 | Wt 215.0 lb

## 2020-05-22 DIAGNOSIS — Z95828 Presence of other vascular implants and grafts: Secondary | ICD-10-CM

## 2020-05-22 DIAGNOSIS — C50411 Malignant neoplasm of upper-outer quadrant of right female breast: Secondary | ICD-10-CM | POA: Diagnosis not present

## 2020-05-22 DIAGNOSIS — Z170 Estrogen receptor positive status [ER+]: Secondary | ICD-10-CM

## 2020-05-22 LAB — CBC WITH DIFFERENTIAL (CANCER CENTER ONLY)
Abs Immature Granulocytes: 0.02 10*3/uL (ref 0.00–0.07)
Basophils Absolute: 0 10*3/uL (ref 0.0–0.1)
Basophils Relative: 1 %
Eosinophils Absolute: 0.1 10*3/uL (ref 0.0–0.5)
Eosinophils Relative: 4 %
HCT: 28.8 % — ABNORMAL LOW (ref 36.0–46.0)
Hemoglobin: 9.3 g/dL — ABNORMAL LOW (ref 12.0–15.0)
Immature Granulocytes: 1 %
Lymphocytes Relative: 42 %
Lymphs Abs: 1.1 10*3/uL (ref 0.7–4.0)
MCH: 28.6 pg (ref 26.0–34.0)
MCHC: 32.3 g/dL (ref 30.0–36.0)
MCV: 88.6 fL (ref 80.0–100.0)
Monocytes Absolute: 0.2 10*3/uL (ref 0.1–1.0)
Monocytes Relative: 7 %
Neutro Abs: 1.2 10*3/uL — ABNORMAL LOW (ref 1.7–7.7)
Neutrophils Relative %: 45 %
Platelet Count: 181 10*3/uL (ref 150–400)
RBC: 3.25 MIL/uL — ABNORMAL LOW (ref 3.87–5.11)
RDW: 12.8 % (ref 11.5–15.5)
WBC Count: 2.5 10*3/uL — ABNORMAL LOW (ref 4.0–10.5)
nRBC: 0 % (ref 0.0–0.2)

## 2020-05-22 LAB — CMP (CANCER CENTER ONLY)
ALT: 21 U/L (ref 0–44)
AST: 17 U/L (ref 15–41)
Albumin: 3.5 g/dL (ref 3.5–5.0)
Alkaline Phosphatase: 132 U/L — ABNORMAL HIGH (ref 38–126)
Anion gap: 5 (ref 5–15)
BUN: 7 mg/dL (ref 6–20)
CO2: 24 mmol/L (ref 22–32)
Calcium: 8.6 mg/dL — ABNORMAL LOW (ref 8.9–10.3)
Chloride: 109 mmol/L (ref 98–111)
Creatinine: 0.66 mg/dL (ref 0.44–1.00)
GFR, Estimated: >60 mL/min (ref >60)
Glucose, Bld: 121 mg/dL — ABNORMAL HIGH (ref 70–99)
Potassium: 3.9 mmol/L (ref 3.5–5.1)
Sodium: 138 mmol/L (ref 135–145)
Total Bilirubin: 0.3 mg/dL (ref 0.3–1.2)
Total Protein: 6.7 g/dL (ref 6.5–8.1)

## 2020-05-22 LAB — TSH: TSH: 1.051 u[IU]/mL (ref 0.308–3.960)

## 2020-05-22 MED ORDER — DIPHENHYDRAMINE HCL 50 MG/ML IJ SOLN
INTRAMUSCULAR | Status: AC
  Filled 2020-05-22: qty 1

## 2020-05-22 MED ORDER — SODIUM CHLORIDE 0.9 % IV SOLN
10.0000 mg | Freq: Once | INTRAVENOUS | Status: AC
  Administered 2020-05-22: 10 mg via INTRAVENOUS
  Filled 2020-05-22: qty 10

## 2020-05-22 MED ORDER — DIPHENHYDRAMINE HCL 50 MG/ML IJ SOLN
25.0000 mg | Freq: Once | INTRAMUSCULAR | Status: AC
  Administered 2020-05-22: 25 mg via INTRAVENOUS

## 2020-05-22 MED ORDER — SODIUM CHLORIDE 0.9 % IV SOLN
Freq: Once | INTRAVENOUS | Status: AC
  Filled 2020-05-22: qty 250

## 2020-05-22 MED ORDER — SODIUM CHLORIDE 0.9% FLUSH
10.0000 mL | INTRAVENOUS | Status: DC | PRN
  Administered 2020-05-22: 10 mL via INTRAVENOUS
  Filled 2020-05-22: qty 10

## 2020-05-22 MED ORDER — FAMOTIDINE IN NACL 20-0.9 MG/50ML-% IV SOLN
20.0000 mg | Freq: Once | INTRAVENOUS | Status: AC
  Administered 2020-05-22: 20 mg via INTRAVENOUS

## 2020-05-22 MED ORDER — SODIUM CHLORIDE 0.9% FLUSH
10.0000 mL | INTRAVENOUS | Status: DC | PRN
  Administered 2020-05-22: 10 mL
  Filled 2020-05-22: qty 10

## 2020-05-22 MED ORDER — FAMOTIDINE IN NACL 20-0.9 MG/50ML-% IV SOLN
INTRAVENOUS | Status: AC
  Filled 2020-05-22: qty 50

## 2020-05-22 MED ORDER — SODIUM CHLORIDE 0.9 % IV SOLN
80.0000 mg/m2 | Freq: Once | INTRAVENOUS | Status: AC
  Administered 2020-05-22: 174 mg via INTRAVENOUS
  Filled 2020-05-22: qty 29

## 2020-05-22 MED ORDER — HEPARIN SOD (PORK) LOCK FLUSH 100 UNIT/ML IV SOLN
500.0000 [IU] | Freq: Once | INTRAVENOUS | Status: AC | PRN
  Administered 2020-05-22: 500 [IU]
  Filled 2020-05-22: qty 5

## 2020-05-22 NOTE — Progress Notes (Signed)
Per Dr. Lindi Adie, okay for patient to receive treatment today with [place] 1.2.

## 2020-05-22 NOTE — Patient Instructions (Signed)
Implanted Port Insertion, Care After This sheet gives you information about how to care for yourself after your procedure. Your health care provider may also give you more specific instructions. If you have problems or questions, contact your health care provider. What can I expect after the procedure? After the procedure, it is common to have:  Discomfort at the port insertion site.  Bruising on the skin over the port. This should improve over 3-4 days. Follow these instructions at home: Port care  After your port is placed, you will get a manufacturer's information card. The card has information about your port. Keep this card with you at all times.  Take care of the port as told by your health care provider. Ask your health care provider if you or a family member can get training for taking care of the port at home. A home health care nurse may also take care of the port.  Make sure to remember what type of port you have. Incision care  Follow instructions from your health care provider about how to take care of your port insertion site. Make sure you: ? Wash your hands with soap and water before and after you change your bandage (dressing). If soap and water are not available, use hand sanitizer. ? Change your dressing as told by your health care provider. ? Leave stitches (sutures), skin glue, or adhesive strips in place. These skin closures may need to stay in place for 2 weeks or longer. If adhesive strip edges start to loosen and curl up, you may trim the loose edges. Do not remove adhesive strips completely unless your health care provider tells you to do that.  Check your port insertion site every day for signs of infection. Check for: ? Redness, swelling, or pain. ? Fluid or blood. ? Warmth. ? Pus or a bad smell.      Activity  Return to your normal activities as told by your health care provider. Ask your health care provider what activities are safe for you.  Do not  lift anything that is heavier than 10 lb (4.5 kg), or the limit that you are told, until your health care provider says that it is safe. General instructions  Take over-the-counter and prescription medicines only as told by your health care provider.  Do not take baths, swim, or use a hot tub until your health care provider approves. Ask your health care provider if you may take showers. You may only be allowed to take sponge baths.  Do not drive for 24 hours if you were given a sedative during your procedure.  Wear a medical alert bracelet in case of an emergency. This will tell any health care providers that you have a port.  Keep all follow-up visits as told by your health care provider. This is important. Contact a health care provider if:  You cannot flush your port with saline as directed, or you cannot draw blood from the port.  You have a fever or chills.  You have redness, swelling, or pain around your port insertion site.  You have fluid or blood coming from your port insertion site.  Your port insertion site feels warm to the touch.  You have pus or a bad smell coming from the port insertion site. Get help right away if:  You have chest pain or shortness of breath.  You have bleeding from your port that you cannot control. Summary  Take care of the port as told by your   health care provider. Keep the manufacturer's information card with you at all times.  Change your dressing as told by your health care provider.  Contact a health care provider if you have a fever or chills or if you have redness, swelling, or pain around your port insertion site.  Keep all follow-up visits as told by your health care provider. This information is not intended to replace advice given to you by your health care provider. Make sure you discuss any questions you have with your health care provider. Document Revised: 09/26/2017 Document Reviewed: 09/26/2017 Elsevier Patient Education   2021 [employer] Inc[employer]  

## 2020-05-22 NOTE — Patient Instructions (Addendum)
[facility] Discharge Instructions for Patients Receiving Chemotherapy  Today you received the following chemotherapy agents:  Taxol.  To help prevent nausea and vomiting after your treatment, we encourage you to take your nausea medication as directed.   If you develop nausea and vomiting that is not controlled by your nausea medication, call the clinic.   BELOW ARE SYMPTOMS THAT SHOULD BE REPORTED IMMEDIATELY:  *FEVER GREATER THAN 100.5 F  *CHILLS WITH OR WITHOUT FEVER  NAUSEA AND VOMITING THAT IS NOT CONTROLLED WITH YOUR NAUSEA MEDICATION  *UNUSUAL SHORTNESS OF BREATH  *UNUSUAL BRUISING OR BLEEDING  TENDERNESS IN MOUTH AND THROAT WITH OR WITHOUT PRESENCE OF ULCERS  *URINARY PROBLEMS  *BOWEL PROBLEMS  UNUSUAL RASH Items with * indicate a potential emergency and should be followed up as soon as possible.  Feel free to call the clinic should you have any questions or concerns. The clinic phone number is [phone].  Please show the CHEMO ALERT CARD at check-in to the Emergency Department and triage nurse.   

## 2020-05-22 NOTE — Telephone Encounter (Signed)
Scheduled appts per 3/4 los. Spoke with patient. Confirmed all appts

## 2020-05-23 LAB — T4: T4, Total: 6.9 ug/dL (ref 4.5–12.0)

## 2020-05-25 ENCOUNTER — Telehealth: Payer: Self-pay

## 2020-05-25 NOTE — Telephone Encounter (Signed)
Contacted pt to verify dates for her son's intermittent FMLA

## 2020-05-26 ENCOUNTER — Encounter: Payer: Self-pay | Admitting: *Deleted

## 2020-05-28 ENCOUNTER — Telehealth: Payer: Self-pay | Admitting: Oncology

## 2020-05-29 ENCOUNTER — Inpatient Hospital Stay: Payer: BC Managed Care – PPO

## 2020-05-29 ENCOUNTER — Other Ambulatory Visit: Payer: Self-pay

## 2020-05-29 VITALS — BP 113/66 | HR 83 | Temp 97.8°F | Resp 17 | Wt 216.0 lb

## 2020-05-29 DIAGNOSIS — C50411 Malignant neoplasm of upper-outer quadrant of right female breast: Secondary | ICD-10-CM

## 2020-05-29 DIAGNOSIS — Z170 Estrogen receptor positive status [ER+]: Secondary | ICD-10-CM

## 2020-05-29 DIAGNOSIS — Z95828 Presence of other vascular implants and grafts: Secondary | ICD-10-CM

## 2020-05-29 LAB — CMP (CANCER CENTER ONLY)
ALT: 28 U/L (ref 0–44)
AST: 19 U/L (ref 15–41)
Albumin: 3.8 g/dL (ref 3.5–5.0)
Alkaline Phosphatase: 123 U/L (ref 38–126)
Anion gap: 7 (ref 5–15)
BUN: 15 mg/dL (ref 6–20)
CO2: 24 mmol/L (ref 22–32)
Calcium: 9.5 mg/dL (ref 8.9–10.3)
Chloride: 105 mmol/L (ref 98–111)
Creatinine: 0.7 mg/dL (ref 0.44–1.00)
GFR, Estimated: >60 mL/min (ref >60)
Glucose, Bld: 130 mg/dL — ABNORMAL HIGH (ref 70–99)
Potassium: 3.3 mmol/L — ABNORMAL LOW (ref 3.5–5.1)
Sodium: 136 mmol/L (ref 135–145)
Total Bilirubin: 0.2 mg/dL — ABNORMAL LOW (ref 0.3–1.2)
Total Protein: 7.1 g/dL (ref 6.5–8.1)

## 2020-05-29 LAB — CBC WITH DIFFERENTIAL (CANCER CENTER ONLY)
Abs Immature Granulocytes: 0.01 10*3/uL (ref 0.00–0.07)
Basophils Absolute: 0 10*3/uL (ref 0.0–0.1)
Basophils Relative: 0 %
Eosinophils Absolute: 0 10*3/uL (ref 0.0–0.5)
Eosinophils Relative: 1 %
HCT: 26.2 % — ABNORMAL LOW (ref 36.0–46.0)
Hemoglobin: 8.6 g/dL — ABNORMAL LOW (ref 12.0–15.0)
Immature Granulocytes: 0 %
Lymphocytes Relative: 39 %
Lymphs Abs: 1.4 10*3/uL (ref 0.7–4.0)
MCH: 29.4 pg (ref 26.0–34.0)
MCHC: 32.8 g/dL (ref 30.0–36.0)
MCV: 89.4 fL (ref 80.0–100.0)
Monocytes Absolute: 0.4 10*3/uL (ref 0.1–1.0)
Monocytes Relative: 13 %
Neutro Abs: 1.6 10*3/uL — ABNORMAL LOW (ref 1.7–7.7)
Neutrophils Relative %: 47 %
Platelet Count: 227 10*3/uL (ref 150–400)
RBC: 2.93 MIL/uL — ABNORMAL LOW (ref 3.87–5.11)
RDW: 13.8 % (ref 11.5–15.5)
WBC Count: 3.5 10*3/uL — ABNORMAL LOW (ref 4.0–10.5)
nRBC: 0 % (ref 0.0–0.2)

## 2020-05-29 LAB — TSH: TSH: 0.837 u[IU]/mL (ref 0.308–3.960)

## 2020-05-29 MED ORDER — SODIUM CHLORIDE 0.9 % IV SOLN
Freq: Once | INTRAVENOUS | Status: AC
  Filled 2020-05-29: qty 250

## 2020-05-29 MED ORDER — SODIUM CHLORIDE 0.9% FLUSH
10.0000 mL | INTRAVENOUS | Status: DC | PRN
  Administered 2020-05-29: 10 mL
  Filled 2020-05-29: qty 10

## 2020-05-29 MED ORDER — SODIUM CHLORIDE 0.9 % IV SOLN
80.0000 mg/m2 | Freq: Once | INTRAVENOUS | Status: AC
  Administered 2020-05-29: 174 mg via INTRAVENOUS
  Filled 2020-05-29: qty 29

## 2020-05-29 MED ORDER — DIPHENHYDRAMINE HCL 50 MG/ML IJ SOLN
INTRAMUSCULAR | Status: AC
  Filled 2020-05-29: qty 1

## 2020-05-29 MED ORDER — HEPARIN SOD (PORK) LOCK FLUSH 100 UNIT/ML IV SOLN
500.0000 [IU] | Freq: Once | INTRAVENOUS | Status: AC | PRN
  Administered 2020-05-29: 500 [IU]
  Filled 2020-05-29: qty 5

## 2020-05-29 MED ORDER — SODIUM CHLORIDE 0.9% FLUSH
10.0000 mL | INTRAVENOUS | Status: DC | PRN
  Administered 2020-05-29: 10 mL via INTRAVENOUS
  Filled 2020-05-29: qty 10

## 2020-05-29 MED ORDER — FAMOTIDINE IN NACL 20-0.9 MG/50ML-% IV SOLN
20.0000 mg | Freq: Once | INTRAVENOUS | Status: AC
  Administered 2020-05-29: 20 mg via INTRAVENOUS

## 2020-05-29 MED ORDER — FAMOTIDINE IN NACL 20-0.9 MG/50ML-% IV SOLN
INTRAVENOUS | Status: AC
  Filled 2020-05-29: qty 50

## 2020-05-29 MED ORDER — SODIUM CHLORIDE 0.9 % IV SOLN
10.0000 mg | Freq: Once | INTRAVENOUS | Status: AC
  Administered 2020-05-29: 10 mg via INTRAVENOUS
  Filled 2020-05-29: qty 10

## 2020-05-29 MED ORDER — DIPHENHYDRAMINE HCL 50 MG/ML IJ SOLN
25.0000 mg | Freq: Once | INTRAMUSCULAR | Status: AC
  Administered 2020-05-29: 25 mg via INTRAVENOUS

## 2020-05-29 NOTE — Patient Instructions (Signed)
Implanted Port Insertion, Care After This sheet gives you information about how to care for yourself after your procedure. Your health care provider may also give you more specific instructions. If you have problems or questions, contact your health care provider. What can I expect after the procedure? After the procedure, it is common to have:  Discomfort at the port insertion site.  Bruising on the skin over the port. This should improve over 3-4 days. Follow these instructions at home: Port care  After your port is placed, you will get a manufacturer's information card. The card has information about your port. Keep this card with you at all times.  Take care of the port as told by your health care provider. Ask your health care provider if you or a family member can get training for taking care of the port at home. A home health care nurse may also take care of the port.  Make sure to remember what type of port you have. Incision care  Follow instructions from your health care provider about how to take care of your port insertion site. Make sure you: ? Wash your hands with soap and water before and after you change your bandage (dressing). If soap and water are not available, use hand sanitizer. ? Change your dressing as told by your health care provider. ? Leave stitches (sutures), skin glue, or adhesive strips in place. These skin closures may need to stay in place for 2 weeks or longer. If adhesive strip edges start to loosen and curl up, you may trim the loose edges. Do not remove adhesive strips completely unless your health care provider tells you to do that.  Check your port insertion site every day for signs of infection. Check for: ? Redness, swelling, or pain. ? Fluid or blood. ? Warmth. ? Pus or a bad smell.      Activity  Return to your normal activities as told by your health care provider. Ask your health care provider what activities are safe for you.  Do not  lift anything that is heavier than 10 lb (4.5 kg), or the limit that you are told, until your health care provider says that it is safe. General instructions  Take over-the-counter and prescription medicines only as told by your health care provider.  Do not take baths, swim, or use a hot tub until your health care provider approves. Ask your health care provider if you may take showers. You may only be allowed to take sponge baths.  Do not drive for 24 hours if you were given a sedative during your procedure.  Wear a medical alert bracelet in case of an emergency. This will tell any health care providers that you have a port.  Keep all follow-up visits as told by your health care provider. This is important. Contact a health care provider if:  You cannot flush your port with saline as directed, or you cannot draw blood from the port.  You have a fever or chills.  You have redness, swelling, or pain around your port insertion site.  You have fluid or blood coming from your port insertion site.  Your port insertion site feels warm to the touch.  You have pus or a bad smell coming from the port insertion site. Get help right away if:  You have chest pain or shortness of breath.  You have bleeding from your port that you cannot control. Summary  Take care of the port as told by your   health care provider. Keep the manufacturer's information card with you at all times.  Change your dressing as told by your health care provider.  Contact a health care provider if you have a fever or chills or if you have redness, swelling, or pain around your port insertion site.  Keep all follow-up visits as told by your health care provider. This information is not intended to replace advice given to you by your health care provider. Make sure you discuss any questions you have with your health care provider. Document Revised: 09/26/2017 Document Reviewed: 09/26/2017 Elsevier Patient Education   2021 [employer] Inc[employer]  

## 2020-05-29 NOTE — Patient Instructions (Signed)
[facility] Discharge Instructions for Patients Receiving Chemotherapy  Today you received the following chemotherapy agents:  Taxol.  To help prevent nausea and vomiting after your treatment, we encourage you to take your nausea medication as directed.   If you develop nausea and vomiting that is not controlled by your nausea medication, call the clinic.   BELOW ARE SYMPTOMS THAT SHOULD BE REPORTED IMMEDIATELY:  *FEVER GREATER THAN 100.5 F  *CHILLS WITH OR WITHOUT FEVER  NAUSEA AND VOMITING THAT IS NOT CONTROLLED WITH YOUR NAUSEA MEDICATION  *UNUSUAL SHORTNESS OF BREATH  *UNUSUAL BRUISING OR BLEEDING  TENDERNESS IN MOUTH AND THROAT WITH OR WITHOUT PRESENCE OF ULCERS  *URINARY PROBLEMS  *BOWEL PROBLEMS  UNUSUAL RASH Items with * indicate a potential emergency and should be followed up as soon as possible.  Feel free to call the clinic should you have any questions or concerns. The clinic phone number is [phone].  Please show the CHEMO ALERT CARD at check-in to the Emergency Department and triage nurse.   

## 2020-05-30 ENCOUNTER — Inpatient Hospital Stay: Payer: BC Managed Care – PPO

## 2020-05-30 ENCOUNTER — Other Ambulatory Visit: Payer: Self-pay

## 2020-05-30 VITALS — BP 135/88 | HR 96 | Temp 98.4°F | Resp 20 | Ht 69.0 in

## 2020-05-30 DIAGNOSIS — C50411 Malignant neoplasm of upper-outer quadrant of right female breast: Secondary | ICD-10-CM | POA: Diagnosis not present

## 2020-05-30 LAB — T4: T4, Total: 8 ug/dL (ref 4.5–12.0)

## 2020-05-30 MED ORDER — FILGRASTIM-AAFI 480 MCG/0.8ML IJ SOSY
480.0000 ug | PREFILLED_SYRINGE | Freq: Once | INTRAMUSCULAR | Status: AC
  Administered 2020-05-30: 480 ug via SUBCUTANEOUS

## 2020-06-01 ENCOUNTER — Inpatient Hospital Stay: Payer: BC Managed Care – PPO

## 2020-06-01 ENCOUNTER — Other Ambulatory Visit: Payer: Self-pay

## 2020-06-01 VITALS — BP 109/69 | HR 102 | Temp 98.5°F | Resp 18

## 2020-06-01 DIAGNOSIS — C50411 Malignant neoplasm of upper-outer quadrant of right female breast: Secondary | ICD-10-CM

## 2020-06-01 DIAGNOSIS — Z170 Estrogen receptor positive status [ER+]: Secondary | ICD-10-CM

## 2020-06-01 MED ORDER — FILGRASTIM-SNDZ 480 MCG/0.8ML IJ SOSY
PREFILLED_SYRINGE | INTRAMUSCULAR | Status: AC
  Filled 2020-06-01: qty 0.8

## 2020-06-01 MED ORDER — FILGRASTIM-AAFI 480 MCG/0.8ML IJ SOSY
480.0000 ug | PREFILLED_SYRINGE | Freq: Once | INTRAMUSCULAR | Status: AC
  Administered 2020-06-01: 480 ug via SUBCUTANEOUS
  Filled 2020-06-01: qty 0.8

## 2020-06-02 ENCOUNTER — Inpatient Hospital Stay: Payer: BC Managed Care – PPO

## 2020-06-02 VITALS — BP 134/84 | HR 109

## 2020-06-02 DIAGNOSIS — C50411 Malignant neoplasm of upper-outer quadrant of right female breast: Secondary | ICD-10-CM | POA: Diagnosis not present

## 2020-06-02 MED ORDER — FILGRASTIM-AAFI 480 MCG/0.8ML IJ SOSY
480.0000 ug | PREFILLED_SYRINGE | Freq: Once | INTRAMUSCULAR | Status: AC
  Administered 2020-06-02: 480 ug via SUBCUTANEOUS
  Filled 2020-06-02: qty 0.8

## 2020-06-03 NOTE — Progress Notes (Signed)
[facility]  [phone] [phone]     ID: [name] DOB: August 15, 1965  MR#: [id number]  GHW#:[id number]  Patient Care Team: [name], MD as PCP - General (Family Medicine) Mauro Kaufmann, RN as Oncology Nurse Navigator Rockwell Germany, RN as Oncology Nurse Navigator Rolm Bookbinder, MD as Consulting Physician (General Surgery) Taylormarie Register, Virgie Dad, MD as Consulting Physician (Oncology) Kyung Rudd, MD as Consulting Physician (Radiation Oncology) Cheri Fowler, MD as Consulting Physician (Obstetrics and Gynecology) Elsie Saas, MD as Consulting Physician (Orthopedic Surgery) Juanita Craver, MD as Consulting Physician (Gastroenterology) Chauncey Cruel, MD OTHER MD:  CHIEF COMPLAINT: Triple negative breast cancer  CURRENT TREATMENT: Neoadjuvant chemotherapy   INTERVAL HISTORY: [name] returns today for follow up and treatment of her 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    She is receiving Paclitaxel, Carboplatin, and Pembrolizumab.  She receives the Pembrolizumab and Carboplatin on day 1 of a 21 day cycle, and Paclitaxel on days 1, 8, and 15.  She receives fill gastrium for 3 days after the day 15 paclitaxel treatment.    Today is cycle 3 day 1.  She tells me she feels fine, continues to work full-time (worked third shift last night).  Denies problems with nausea vomiting unusual fatigue peripheral neuropathy or any other issues.    REVIEW OF SYSTEMS: A detailed review of systems today was stable except as noted above.   COVID 19 VACCINATION STATUS: fully vaccinated Therapist, music), with booster   HISTORY OF CURRENT ILLNESS: From the original intake note:  [name] had routine screening mammography showing a possible abnormality in the right breast and a prominent intramammary lymph node. She underwent right diagnostic mammography with tomography and right breast ultrasonography at [facility] on 03/26/2020 showing:  breast density category B; palpable 1.8 cm right breast mass at 10 o'clock, which feels much larger than what is visualized sonographically; suspicious 0.6 cm intramammary lymph node in right breast at 10 o'clock; at least 3 morphologically abnormal lymph nodes in right axilla.  Accordingly on 03/27/2020 she proceeded to biopsy of the right breast areas in question. The pathology from this procedure ([name]) showed:  1. Right Breast, 10 o'clock (mass)  - invasive mammary carcinoma, e-cadherin positive with a lobular growth pattern, grade 2  - mammary carcinoma in situ  - Prognostic indicators significant for: estrogen receptor, 1% positive and progesterone receptor, 1% positive, both with strong staining intensity. Proliferation marker Ki67 at 30%. HER2 negative by immunohistochemistry (0). 2. Right Breast, 10 o'clock (intramammary node)  - mammary carcinoma involving lymph node and perinodal soft tissue  -  Prognostic indicators significant for: estrogen receptor, 1% positive with strong staining intensity and progesterone receptor, 0% negative. Proliferation marker Ki67 at 40%. HER2 negative by immunohistochemistry (0).  Cancer Staging Malignant neoplasm of upper-outer quadrant of right breast in female, estrogen receptor positive ([place]) Staging form: Breast, AJCC 8th Edition - Pathologic stage from 03/31/2020: Stage IV (cM1) - Signed by Gardenia Phlegm, NP on 05/01/2020 - Clinical stage from 04/08/2020: Stage IIB (cT1c, cN1(f), cM0, G2, ER-, PR-, HER2-) - Signed by Chauncey Cruel, MD on 04/08/2020 Stage prefix: Initial diagnosis Method of lymph node assessment: Core biopsy  The patient's subsequent history is as detailed below.   PAST MEDICAL HISTORY: Past Medical History:  Diagnosis Date  . Allergy   . Arthritis 2018   hands, left knee  . Breast cancer ([place]) 03-27-2020   right breast IMC  . Eczema   .  Family history of breast cancer   . Family history of lung cancer    . Family history of prostate cancer   . Hypertension     PAST SURGICAL HISTORY: Past Surgical History:  Procedure Laterality Date  . ABDOMINAL HYSTERECTOMY    . CESAREAN SECTION    . KNEE ARTHROSCOPY W/ ACL RECONSTRUCTION Left   . PORTACATH PLACEMENT Left 04/23/2020   Procedure: LEFT SIDE PORT PLACEMENT WITH ULTRASOUND GUIDANCE;  Surgeon: Rolm Bookbinder, MD;  Location: [place];  Service: General;  Laterality: Left;  230PM START TIME PLEASE ROOM 2 THANKS  . TUBAL LIGATION      FAMILY HISTORY: Family History  Problem Relation Age of Onset  . Breast cancer Mother 36  . Arthritis Mother   . Diabetes Father   . Kidney disease Father   . Hypertension Sister   . Heart disease Maternal Grandmother   . Breast cancer Paternal Grandmother        dx >50  . Heart disease Maternal Aunt   . Breast cancer Maternal Aunt 77  . Other Maternal Aunt        brain tumor (not cancerous)  . Lung cancer Maternal Aunt   . Prostate cancer Cousin 6       localized   Her parents are both living, her father age 10 and mother age 71, as of 03/2020. [name] has 1 brother and 2 sisters. She reports breast cancer in her mother at age 41, her maternal aunt, and her paternal grandmother. Her maternal aunt also has a history of lung cancer. She also reports prostate cancer in a maternal cousin in his 20's.   GYNECOLOGIC HISTORY:  No LMP recorded. Patient has had a hysterectomy. Menarche: 55 years old Age at first live birth: 55 years old [place] P 2 LMP 2010 with hysterectomy Contraceptive: prior use without issue HRT never used  Hysterectomy? Yes, 2010 for fibroids BSO? 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 She is divorced. She lives at home with son [name], age 71, who works for [employer]. Son [name], [name], age 45, works as a Development worker, international aid in [place], [place]. Kyarra has no grandchildren. She attends  [employer].    ADVANCED DIRECTIVES: not in place; considering naming mother [name], son [name], or son [name] as healthcare powers of attorney   HEALTH MAINTENANCE: Social History   Tobacco Use  . Smoking status: Former Smoker    Packs/day: 0.25    Years: 36.00    Pack years: 9.00    Types: Cigarettes    Quit date: 04/22/2017    Years since quitting: 3.1  . Smokeless tobacco: Never Used  . Tobacco comment: She is trying to quit.    Substance Use Topics  . Alcohol use: Yes    Alcohol/week: 3.0 standard drinks    Types: 3 Glasses of wine per week    Comment: Occasional   . Drug use: Never     Colonoscopy: age 22  PAP: date unsure  Bone density: never done   No Known Allergies  Current Outpatient Medications  Medication Sig Dispense Refill  . acetaminophen (TYLENOL) 500 MG tablet Take 1,000 mg by mouth every 6 (six) hours as needed for moderate pain.    Marland Kitchen aspirin EC 81 MG tablet Take 81 mg by mouth daily. Swallow whole. (Patient not taking: Reported on 05/15/2020)    . Boric Acid Vaginal 600  MG SUPP Place 600 mg vaginally daily as needed (yeast infections).    . Cholecalciferol (VITAMIN D) 125 MCG (5000 UT) CAPS Take 5,000 Units by mouth daily.    . clobetasol ointment (TEMOVATE) 1.27 % Apply 1 application topically 2 (two) times daily as needed (rash/itching).    . Cyanocobalamin (B-12) 5000 MCG CAPS Take 5,000 mcg by mouth daily.    Marland Kitchen lidocaine-prilocaine (EMLA) cream Apply to affected area once (Patient taking differently: Apply 1 application topically daily as needed (port access).) 30 g 3  . loperamide (IMODIUM A-D) 2 MG tablet Take 2 mg by mouth 4 (four) times daily as needed for diarrhea or loose stools. (Patient not taking: No sig reported)    . loratadine (CLARITIN) 10 MG tablet Take 10 mg by mouth daily.    Marland Kitchen olmesartan-hydrochlorothiazide (BENICAR HCT) 40-25 MG tablet Take 1 tablet by mouth every morning.    . prochlorperazine (COMPAZINE)  10 MG tablet Take 1 tablet (10 mg total) by mouth every 6 (six) hours as needed (Nausea or vomiting). 30 tablet 1  . traMADol (ULTRAM) 50 MG tablet Take 2 tablets (100 mg total) by mouth every 6 (six) hours as needed. 10 tablet 0   No current facility-administered medications for this visit.    OBJECTIVE: African-American woman in no acute distress  Vitals:   06/04/20 1033  BP: 118/72  Pulse: 89  Resp: 20  Temp: (!) 97.2 F (36.2 C)  SpO2: 100%     Body mass index is 31.29 kg/m.   Wt Readings from Last 3 Encounters:  06/04/20 211 lb 14.4 oz (96.1 kg)  05/29/20 216 lb (98 kg)  05/22/20 215 lb (97.5 kg)      ECOG FS:1 - Symptomatic but completely ambulatory  Sclerae unicteric, EOMs intact Wearing a mask No cervical or supraclavicular adenopathy Lungs no rales or rhonchi Heart regular rate and rhythm Abd soft, nontender, positive bowel sounds MSK no focal spinal tenderness, no upper extremity lymphedema Neuro: nonfocal, well oriented, appropriate affect Breasts: I no longer palpate a well-defined mass in the right breast.  There are no skin or nipple changes of concern.  Both axillae are benign   LAB RESULTS:  CMP     Component Value Date/Time   NA 136 05/29/2020 0921   K 3.3 (L) 05/29/2020 0921   CL 105 05/29/2020 0921   CO2 24 05/29/2020 0921   GLUCOSE 130 (H) 05/29/2020 0921   BUN 15 05/29/2020 0921   CREATININE 0.70 05/29/2020 0921   CREATININE 0.69 09/24/2013 0819   CALCIUM 9.5 05/29/2020 0921   PROT 7.1 05/29/2020 0921   ALBUMIN 3.8 05/29/2020 0921   AST 19 05/29/2020 0921   ALT 28 05/29/2020 0921   ALKPHOS 123 05/29/2020 0921   BILITOT 0.2 (L) 05/29/2020 0921   GFRNONAA >60 05/29/2020 0921   GFRNONAA >89 09/24/2013 0819   GFRAA >89 09/24/2013 0819    No results found for: TOTALPROTELP, ALBUMINELP, A1GS, A2GS, BETS, BETA2SER, GAMS, MSPIKE, SPEI  Lab Results  Component Value Date   WBC 7.8 06/04/2020   NEUTROABS PENDING 06/04/2020   HGB 8.7 (L)  06/04/2020   HCT 27.4 (L) 06/04/2020   MCV 92.3 06/04/2020   PLT 230 06/04/2020    No results found for: LABCA2  No components found for: [id number]  No results for input(s): INR in the last 168 hours.  No results found for: LABCA2  No results found for: SWH675  No results found for: FFM384  No results found for:  CWC376  No results found for: CA2729  No components found for: HGQUANT  No results found for: CEA1 / No results found for: CEA1   No results found for: AFPTUMOR  No results found for: CHROMOGRNA  No results found for: KPAFRELGTCHN, LAMBDASER, KAPLAMBRATIO (kappa/lambda light chains)  No results found for: HGBA, HGBA2QUANT, HGBFQUANT, HGBSQUAN (Hemoglobinopathy evaluation)   No results found for: LDH  No results found for: IRON, TIBC, IRONPCTSAT (Iron and TIBC)  No results found for: FERRITIN  Urinalysis No results found for: COLORURINE, APPEARANCEUR, LABSPEC, PHURINE, GLUCOSEU, HGBUR, BILIRUBINUR, KETONESUR, PROTEINUR, UROBILINOGEN, NITRITE, LEUKOCYTESUR   STUDIES: No results found.   ELIGIBLE FOR AVAILABLE RESEARCH PROTOCOL: no  ASSESSMENT: 55 y.o. [place] woman status post right breast upper outer quadrant biopsy 03/27/2020 for a clinical T1c N3 M1, stageIV invasive ductal carcinoma, functionally triple negative, with an MIB-1 of 30-40%  (a) chest CT scan 04/20/2020 shows in addition to the breast mass and regional adenopathy, lytic and sclerotic bone changes, possible left thoracic adenopathy, and indeterminant left pleural nodularity  (b) bone scan 04/21/2020 confirms multiple bone lesions  (c) thoracic MRI  (1) genetics testing 04/24/2020 through the [facility] panel found no deleterious mutations in APC, ATM, AXIN2, BARD1, BMPR1A, BRCA1, BRCA2, BRIP1, CDH1, CDK4, CDKN2A, CHEK2, DICER1, EPCAM, GREM1, HOXB13, MEN1, MLH1, MSH2, MSH3, MSH6, MUTYH, NBN, NF1, NF2, NTHL1, PALB2, PMS2, POLD1, POLE, PTEN, RAD51C, RAD51D,  RECQL, RET, SDHA, SDHAF2, SDHB, SDHC, SDHD, SMAD4, SMARCA4, STK11, TP53, TSC1, TSC2, and VHL.  RNA data is routinely analyzed for use in variant interpretation for all genes.  (2) neoadjuvant chemotherapy will consist of pembrolizumab every 3 weeks, carboplatin every 3 weeks x 4 with paclitaxel weekly x12, then reassess for surgery versus continue with doxorubicin and cyclophosphamide every 3 weeks x 4  (3) zoledronate started 05/08/2018, repeated every 12 weeks  (4) definitive surgery to follow  (5) adjuvant radiation   PLAN: [name] is tolerating treatment remarkably well and I really cannot feel a mass in her right breast at present which is already very favorable.  Unfortunately she has lost her hair.  Usually it comes back stronger and currently her so we will wait on that at the completion of treatment.  The plan is to get through 4 cycles of the pembrolizumab/carboplatin/paclitaxel and restage.  If she is having a good response which I think that is where we are heading, then she can proceed to surgery.  We would consider continuing the [place] plus or minus capecitabine postop.  If we do not have a very good response we could add cyclophosphamide and doxorubicin but I would prefer to reserve those for future treatments.  She is receiving fill gastrium for 3 days after the day 15 treatment of each cycle.  That is helping keep her treatments on schedule  Because she prefers to be treated on Fridays and I am not here and Fridays at this point she will see me on Thursday just before starting her next cycle.  She knows to call for any other issue that may develop before the next visit  Total encounter time 35 minutes.Sarajane Jews C. Myles Mallicoat, MD 06/04/20 10:52 AM Medical Oncology and Hematology [facility], [place] [address] Tel. [phone]    Fax. [phone]   I, Wilburn Mylar, am acting as scribe for Dr. Virgie Dad.  Danielle Mink.     *Total Encounter Time as defined by the Centers for Medicare and Medicaid Services includes, in  addition to the face-to-face time of a patient visit (documented in the note above) non-face-to-face time: obtaining and reviewing outside history, ordering and reviewing medications, tests or procedures, care coordination (communications with other health care professionals or caregivers) and documentation in the medical record.

## 2020-06-04 ENCOUNTER — Inpatient Hospital Stay: Payer: BC Managed Care – PPO

## 2020-06-04 ENCOUNTER — Inpatient Hospital Stay (HOSPITAL_BASED_OUTPATIENT_CLINIC_OR_DEPARTMENT_OTHER): Payer: BC Managed Care – PPO | Admitting: Oncology

## 2020-06-04 ENCOUNTER — Other Ambulatory Visit: Payer: Self-pay

## 2020-06-04 ENCOUNTER — Encounter: Payer: Self-pay | Admitting: *Deleted

## 2020-06-04 VITALS — BP 118/72 | HR 89 | Temp 97.2°F | Resp 20 | Ht 69.0 in | Wt 211.9 lb

## 2020-06-04 DIAGNOSIS — C50411 Malignant neoplasm of upper-outer quadrant of right female breast: Secondary | ICD-10-CM

## 2020-06-04 DIAGNOSIS — C7951 Secondary malignant neoplasm of bone: Secondary | ICD-10-CM | POA: Diagnosis not present

## 2020-06-04 DIAGNOSIS — Z95828 Presence of other vascular implants and grafts: Secondary | ICD-10-CM

## 2020-06-04 DIAGNOSIS — Z170 Estrogen receptor positive status [ER+]: Secondary | ICD-10-CM

## 2020-06-04 LAB — CMP (CANCER CENTER ONLY)
ALT: 17 U/L (ref 0–44)
AST: 15 U/L (ref 15–41)
Albumin: 3.7 g/dL (ref 3.5–5.0)
Alkaline Phosphatase: 122 U/L (ref 38–126)
Anion gap: 7 (ref 5–15)
BUN: 14 mg/dL (ref 6–20)
CO2: 24 mmol/L (ref 22–32)
Calcium: 9.3 mg/dL (ref 8.9–10.3)
Chloride: 109 mmol/L (ref 98–111)
Creatinine: 0.7 mg/dL (ref 0.44–1.00)
GFR, Estimated: >60 mL/min (ref >60)
Glucose, Bld: 120 mg/dL — ABNORMAL HIGH (ref 70–99)
Potassium: 3.9 mmol/L (ref 3.5–5.1)
Sodium: 140 mmol/L (ref 135–145)
Total Bilirubin: <0.2 mg/dL — ABNORMAL LOW (ref 0.3–1.2)
Total Protein: 6.6 g/dL (ref 6.5–8.1)

## 2020-06-04 LAB — CBC WITH DIFFERENTIAL (CANCER CENTER ONLY)
Abs Immature Granulocytes: 0.07 10*3/uL (ref 0.00–0.07)
Basophils Absolute: 0 10*3/uL (ref 0.0–0.1)
Basophils Relative: 0 %
Eosinophils Absolute: 0 10*3/uL (ref 0.0–0.5)
Eosinophils Relative: 0 %
HCT: 27.4 % — ABNORMAL LOW (ref 36.0–46.0)
Hemoglobin: 8.7 g/dL — ABNORMAL LOW (ref 12.0–15.0)
Immature Granulocytes: 1 %
Lymphocytes Relative: 24 %
Lymphs Abs: 1.9 10*3/uL (ref 0.7–4.0)
MCH: 29.3 pg (ref 26.0–34.0)
MCHC: 31.8 g/dL (ref 30.0–36.0)
MCV: 92.3 fL (ref 80.0–100.0)
Monocytes Absolute: 1 10*3/uL (ref 0.1–1.0)
Monocytes Relative: 13 %
Neutro Abs: 4.8 10*3/uL (ref 1.7–7.7)
Neutrophils Relative %: 62 %
Platelet Count: 230 10*3/uL (ref 150–400)
RBC: 2.97 MIL/uL — ABNORMAL LOW (ref 3.87–5.11)
RDW: 15.9 % — ABNORMAL HIGH (ref 11.5–15.5)
WBC Count: 7.8 10*3/uL (ref 4.0–10.5)
nRBC: 0 % (ref 0.0–0.2)

## 2020-06-04 LAB — TSH: TSH: 0.974 u[IU]/mL (ref 0.308–3.960)

## 2020-06-04 MED ORDER — SODIUM CHLORIDE 0.9% FLUSH
10.0000 mL | INTRAVENOUS | Status: DC | PRN
  Administered 2020-06-04: 10 mL via INTRAVENOUS
  Filled 2020-06-04: qty 10

## 2020-06-04 NOTE — Patient Instructions (Signed)
Implanted Port Insertion, Care After This sheet gives you information about how to care for yourself after your procedure. Your health care provider may also give you more specific instructions. If you have problems or questions, contact your health care provider. What can I expect after the procedure? After the procedure, it is common to have:  Discomfort at the port insertion site.  Bruising on the skin over the port. This should improve over 3-4 days. Follow these instructions at home: Port care  After your port is placed, you will get a manufacturer's information card. The card has information about your port. Keep this card with you at all times.  Take care of the port as told by your health care provider. Ask your health care provider if you or a family member can get training for taking care of the port at home. A home health care nurse may also take care of the port.  Make sure to remember what type of port you have. Incision care  Follow instructions from your health care provider about how to take care of your port insertion site. Make sure you: ? Wash your hands with soap and water before and after you change your bandage (dressing). If soap and water are not available, use hand sanitizer. ? Change your dressing as told by your health care provider. ? Leave stitches (sutures), skin glue, or adhesive strips in place. These skin closures may need to stay in place for 2 weeks or longer. If adhesive strip edges start to loosen and curl up, you may trim the loose edges. Do not remove adhesive strips completely unless your health care provider tells you to do that.  Check your port insertion site every day for signs of infection. Check for: ? Redness, swelling, or pain. ? Fluid or blood. ? Warmth. ? Pus or a bad smell.      Activity  Return to your normal activities as told by your health care provider. Ask your health care provider what activities are safe for you.  Do not  lift anything that is heavier than 10 lb (4.5 kg), or the limit that you are told, until your health care provider says that it is safe. General instructions  Take over-the-counter and prescription medicines only as told by your health care provider.  Do not take baths, swim, or use a hot tub until your health care provider approves. Ask your health care provider if you may take showers. You may only be allowed to take sponge baths.  Do not drive for 24 hours if you were given a sedative during your procedure.  Wear a medical alert bracelet in case of an emergency. This will tell any health care providers that you have a port.  Keep all follow-up visits as told by your health care provider. This is important. Contact a health care provider if:  You cannot flush your port with saline as directed, or you cannot draw blood from the port.  You have a fever or chills.  You have redness, swelling, or pain around your port insertion site.  You have fluid or blood coming from your port insertion site.  Your port insertion site feels warm to the touch.  You have pus or a bad smell coming from the port insertion site. Get help right away if:  You have chest pain or shortness of breath.  You have bleeding from your port that you cannot control. Summary  Take care of the port as told by your   health care provider. Keep the manufacturer's information card with you at all times.  Change your dressing as told by your health care provider.  Contact a health care provider if you have a fever or chills or if you have redness, swelling, or pain around your port insertion site.  Keep all follow-up visits as told by your health care provider. This information is not intended to replace advice given to you by your health care provider. Make sure you discuss any questions you have with your health care provider. Document Revised: 09/26/2017 Document Reviewed: 09/26/2017 Elsevier Patient Education   2021 [employer] Inc[employer]  

## 2020-06-05 ENCOUNTER — Telehealth: Payer: Self-pay | Admitting: Oncology

## 2020-06-05 ENCOUNTER — Inpatient Hospital Stay: Payer: BC Managed Care – PPO

## 2020-06-05 VITALS — BP 108/72 | HR 100 | Temp 98.0°F | Resp 20

## 2020-06-05 DIAGNOSIS — C50411 Malignant neoplasm of upper-outer quadrant of right female breast: Secondary | ICD-10-CM | POA: Diagnosis not present

## 2020-06-05 DIAGNOSIS — Z170 Estrogen receptor positive status [ER+]: Secondary | ICD-10-CM

## 2020-06-05 LAB — T4: T4, Total: 8.2 ug/dL (ref 4.5–12.0)

## 2020-06-05 MED ORDER — SODIUM CHLORIDE 0.9 % IV SOLN
80.0000 mg/m2 | Freq: Once | INTRAVENOUS | Status: AC
  Administered 2020-06-05: 174 mg via INTRAVENOUS
  Filled 2020-06-05: qty 29

## 2020-06-05 MED ORDER — FAMOTIDINE IN NACL 20-0.9 MG/50ML-% IV SOLN
20.0000 mg | Freq: Once | INTRAVENOUS | Status: AC
Start: 2020-06-05 — End: 2020-06-05
  Administered 2020-06-05: 20 mg via INTRAVENOUS

## 2020-06-05 MED ORDER — PEMBROLIZUMAB CHEMO INJECTION 100 MG/4ML
200.0000 mg | Freq: Once | INTRAVENOUS | Status: AC
  Administered 2020-06-05: 200 mg via INTRAVENOUS
  Filled 2020-06-05: qty 8

## 2020-06-05 MED ORDER — PALONOSETRON HCL INJECTION 0.25 MG/5ML
INTRAVENOUS | Status: AC
  Filled 2020-06-05: qty 5

## 2020-06-05 MED ORDER — SODIUM CHLORIDE 0.9 % IV SOLN
10.0000 mg | Freq: Once | INTRAVENOUS | Status: AC
  Administered 2020-06-05: 10 mg via INTRAVENOUS
  Filled 2020-06-05: qty 10

## 2020-06-05 MED ORDER — CARBOPLATIN CHEMO INJECTION 600 MG/60ML
750.0000 mg | Freq: Once | INTRAVENOUS | Status: AC
Start: 2020-06-05 — End: 2020-06-05
  Administered 2020-06-05: 750 mg via INTRAVENOUS
  Filled 2020-06-05: qty 75

## 2020-06-05 MED ORDER — HEPARIN SOD (PORK) LOCK FLUSH 100 UNIT/ML IV SOLN
500.0000 [IU] | Freq: Once | INTRAVENOUS | Status: AC | PRN
  Administered 2020-06-05: 500 [IU]
  Filled 2020-06-05: qty 5

## 2020-06-05 MED ORDER — DIPHENHYDRAMINE HCL 50 MG/ML IJ SOLN
INTRAMUSCULAR | Status: AC
  Filled 2020-06-05: qty 1

## 2020-06-05 MED ORDER — SODIUM CHLORIDE 0.9% FLUSH
10.0000 mL | INTRAVENOUS | Status: DC | PRN
  Administered 2020-06-05: 10 mL
  Filled 2020-06-05: qty 10

## 2020-06-05 MED ORDER — SODIUM CHLORIDE 0.9 % IV SOLN
Freq: Once | INTRAVENOUS | Status: AC
  Filled 2020-06-05: qty 250

## 2020-06-05 MED ORDER — FAMOTIDINE IN NACL 20-0.9 MG/50ML-% IV SOLN
INTRAVENOUS | Status: AC
  Filled 2020-06-05: qty 50

## 2020-06-05 MED ORDER — DIPHENHYDRAMINE HCL 50 MG/ML IJ SOLN
25.0000 mg | Freq: Once | INTRAMUSCULAR | Status: AC
  Administered 2020-06-05: 25 mg via INTRAVENOUS

## 2020-06-05 MED ORDER — PALONOSETRON HCL INJECTION 0.25 MG/5ML
0.2500 mg | Freq: Once | INTRAVENOUS | Status: AC
  Administered 2020-06-05: 0.25 mg via INTRAVENOUS

## 2020-06-05 MED ORDER — SODIUM CHLORIDE 0.9 % IV SOLN
150.0000 mg | Freq: Once | INTRAVENOUS | Status: AC
  Administered 2020-06-05: 150 mg via INTRAVENOUS
  Filled 2020-06-05: qty 150

## 2020-06-05 NOTE — Patient Instructions (Addendum)
[facility] Discharge Instructions for Patients Receiving Chemotherapy  Today you received the following immunotherapy agent: Pembrolizumab (Keytruda) and chemotherapy agent: Paclitaxel (Taxol) and Carboplatin.  To help prevent nausea and vomiting after your treatment, we encourage you to take your nausea medication as directed by your MD.   If you develop nausea and vomiting that is not controlled by your nausea medication, call the clinic.   BELOW ARE SYMPTOMS THAT SHOULD BE REPORTED IMMEDIATELY:  *FEVER GREATER THAN 100.5 F  *CHILLS WITH OR WITHOUT FEVER  NAUSEA AND VOMITING THAT IS NOT CONTROLLED WITH YOUR NAUSEA MEDICATION  *UNUSUAL SHORTNESS OF BREATH  *UNUSUAL BRUISING OR BLEEDING  TENDERNESS IN MOUTH AND THROAT WITH OR WITHOUT PRESENCE OF ULCERS  *URINARY PROBLEMS  *BOWEL PROBLEMS  UNUSUAL RASH Items with * indicate a potential emergency and should be followed up as soon as possible.  Feel free to call the clinic should you have any questions or concerns. The clinic phone number is [phone].  Please show the [place] at check-in to the Emergency Department and triage nurse.

## 2020-06-05 NOTE — Telephone Encounter (Signed)
Scheduled appts per 3/24 los. Pt aware.

## 2020-06-08 ENCOUNTER — Other Ambulatory Visit: Payer: Self-pay

## 2020-06-08 ENCOUNTER — Emergency Department (HOSPITAL_COMMUNITY)
Admission: EM | Admit: 2020-06-08 | Discharge: 2020-06-08 | Disposition: A | Discharge status: Home / Self Care | Payer: BC Managed Care – PPO | Attending: Emergency Medicine | Admitting: Emergency Medicine

## 2020-06-08 ENCOUNTER — Encounter: Payer: Self-pay | Admitting: Oncology

## 2020-06-08 DIAGNOSIS — Z79899 Other long term (current) drug therapy: Secondary | ICD-10-CM | POA: Diagnosis not present

## 2020-06-08 DIAGNOSIS — Z853 Personal history of malignant neoplasm of breast: Secondary | ICD-10-CM | POA: Diagnosis not present

## 2020-06-08 DIAGNOSIS — Z87891 Personal history of nicotine dependence: Secondary | ICD-10-CM | POA: Insufficient documentation

## 2020-06-08 DIAGNOSIS — R55 Syncope and collapse: Secondary | ICD-10-CM

## 2020-06-08 DIAGNOSIS — I10 Essential (primary) hypertension: Secondary | ICD-10-CM | POA: Insufficient documentation

## 2020-06-08 DIAGNOSIS — Z7982 Long term (current) use of aspirin: Secondary | ICD-10-CM | POA: Insufficient documentation

## 2020-06-08 DIAGNOSIS — R42 Dizziness and giddiness: Secondary | ICD-10-CM | POA: Insufficient documentation

## 2020-06-08 LAB — CBC WITH DIFFERENTIAL/PLATELET
Abs Immature Granulocytes: 0.01 10*3/uL (ref 0.00–0.07)
Basophils Absolute: 0 10*3/uL (ref 0.0–0.1)
Basophils Relative: 0 %
Eosinophils Absolute: 0 10*3/uL (ref 0.0–0.5)
Eosinophils Relative: 0 %
HCT: 30 % — ABNORMAL LOW (ref 36.0–46.0)
Hemoglobin: 9.5 g/dL — ABNORMAL LOW (ref 12.0–15.0)
Immature Granulocytes: 0 %
Lymphocytes Relative: 23 %
Lymphs Abs: 0.8 10*3/uL (ref 0.7–4.0)
MCH: 30.2 pg (ref 26.0–34.0)
MCHC: 31.7 g/dL (ref 30.0–36.0)
MCV: 95.2 fL (ref 80.0–100.0)
Monocytes Absolute: 0.3 10*3/uL (ref 0.1–1.0)
Monocytes Relative: 7 %
Neutro Abs: 2.4 10*3/uL (ref 1.7–7.7)
Neutrophils Relative %: 70 %
Platelets: 203 10*3/uL (ref 150–400)
RBC: 3.15 MIL/uL — ABNORMAL LOW (ref 3.87–5.11)
RDW: 16.7 % — ABNORMAL HIGH (ref 11.5–15.5)
WBC: 3.5 10*3/uL — ABNORMAL LOW (ref 4.0–10.5)
nRBC: 0 % (ref 0.0–0.2)

## 2020-06-08 LAB — BASIC METABOLIC PANEL
Anion gap: 7 (ref 5–15)
BUN: 14 mg/dL (ref 6–20)
CO2: 25 mmol/L (ref 22–32)
Calcium: 9.4 mg/dL (ref 8.9–10.3)
Chloride: 105 mmol/L (ref 98–111)
Creatinine, Ser: 0.52 mg/dL (ref 0.44–1.00)
GFR, Estimated: >60 mL/min (ref >60)
Glucose, Bld: 109 mg/dL — ABNORMAL HIGH (ref 70–99)
Potassium: 3.6 mmol/L (ref 3.5–5.1)
Sodium: 137 mmol/L (ref 135–145)

## 2020-06-08 LAB — CBG MONITORING, ED: Glucose-Capillary: 99 mg/dL (ref 70–99)

## 2020-06-08 MED ORDER — CHLORHEXIDINE GLUCONATE CLOTH 2 % EX PADS: 6.0000 | MEDICATED_PAD | Freq: Every day | CUTANEOUS | Status: DC

## 2020-06-08 MED ORDER — SODIUM CHLORIDE 0.9 % IV BOLUS
1000.0000 mL | Freq: Once | INTRAVENOUS | Status: AC
  Administered 2020-06-08: 1000 mL via INTRAVENOUS

## 2020-06-08 MED ORDER — HEPARIN SOD (PORK) LOCK FLUSH 100 UNIT/ML IV SOLN
500.0000 [IU] | Freq: Once | INTRAVENOUS | Status: AC
  Administered 2020-06-08: 500 [IU]
  Filled 2020-06-08: qty 5

## 2020-06-08 MED ORDER — SODIUM CHLORIDE 0.9% FLUSH: 10.0000 mL | INTRAVENOUS | Status: DC | PRN

## 2020-06-08 NOTE — ED Provider Notes (Signed)
[facility] DEPT Provider Note   CSN: [id number] Arrival date & time: 06/08/20  0159     History Chief Complaint  Patient presents with  . Dizziness    [name] is a 55 y.o. female.  Patient with history of HTN, breast CA currently receiving chemo, to ED for evaluation of an episode tonight while at work where she became flushed and lightheaded, like she was going to pass out. She sat down on the floor and EMS was called by her coworkers. She did not pass out. She denies chest pain, SOB, nausea, headache, diaphoresis. Her symptoms completely resolved by the time EMS arrived and she declined transport. She states she brought herself to the hospital for further evaluation and had a similar episode while checking in. Currently asymptomatic. She reports having a similar event about one month ago while at the [facility] and was found to have hypotension "88/50". She was instructed to stop her blood pressure medication, which she did for 2 weeks, then restarted at 1/2 dose. Her blood pressure was again found to be low, however asymptomatic at that time, and she decreased her medication again to 1/4 dose.   The history is provided by the patient. No language interpreter was used.  Dizziness Associated symptoms: no chest pain, no headaches, no nausea, no shortness of breath and no weakness        Past Medical History:  Diagnosis Date  . Allergy   . Arthritis 2018   hands, left knee  . Breast cancer ([place]) 03-27-2020   right breast IMC  . Eczema   . Family history of breast cancer   . Family history of lung cancer   . Family history of prostate cancer   . Hypertension     Patient Active Problem List   Diagnosis Date Noted  . Genetic testing 04/24/2020  . Bone metastases ([place]) 04/21/2020  . Family history of breast cancer   . Family history of lung cancer   . Family history of prostate cancer   . Malignant neoplasm of upper-outer quadrant of  right breast in female, estrogen receptor positive ([place]) 04/03/2020  . Essential hypertension, benign 02/11/2013  . Other malaise and fatigue 02/11/2013  . Tobacco use disorder 02/11/2013  . Eczema 02/11/2013  . Bacterial vaginosis 02/11/2013    Past Surgical History:  Procedure Laterality Date  . ABDOMINAL HYSTERECTOMY    . CESAREAN SECTION    . KNEE ARTHROSCOPY W/ ACL RECONSTRUCTION Left   . PORTACATH PLACEMENT Left 04/23/2020   Procedure: LEFT SIDE PORT PLACEMENT WITH ULTRASOUND GUIDANCE;  Surgeon: Rolm Bookbinder, MD;  Location: [place];  Service: General;  Laterality: Left;  230PM START TIME PLEASE ROOM 2 THANKS  . TUBAL LIGATION       OB History   No obstetric history on file.     Family History  Problem Relation Age of Onset  . Breast cancer Mother 6  . Arthritis Mother   . Diabetes Father   . Kidney disease Father   . Hypertension Sister   . Heart disease Maternal Grandmother   . Breast cancer Paternal Grandmother        dx >50  . Heart disease Maternal Aunt   . Breast cancer Maternal Aunt 77  . Other Maternal Aunt        brain tumor (not cancerous)  . Lung cancer Maternal Aunt   . Prostate cancer Cousin 66       localized  Social History   Tobacco Use  . Smoking status: Former Smoker    Packs/day: 0.25    Years: 36.00    Pack years: 9.00    Types: Cigarettes    Quit date: 04/22/2017    Years since quitting: 3.1  . Smokeless tobacco: Never Used  . Tobacco comment: She is trying to quit.    Substance Use Topics  . Alcohol use: Yes    Alcohol/week: 3.0 standard drinks    Types: 3 Glasses of wine per week    Comment: Occasional   . Drug use: Never    Home Medications Prior to Admission medications   Medication Sig Start Date End Date Taking? Authorizing Provider  acetaminophen (TYLENOL) 500 MG tablet Take 1,000 mg by mouth every 6 (six) hours as needed for moderate pain.   Yes [provider]  aspirin EC 81 MG tablet  Take 81 mg by mouth daily. Swallow whole.   Yes [provider]  Boric Acid Vaginal 600 MG SUPP Place 600 mg vaginally daily as needed (yeast infections).   Yes [provider]  Cholecalciferol (VITAMIN D) 125 MCG (5000 UT) CAPS Take 5,000 Units by mouth daily.   Yes [provider]  clobetasol ointment (TEMOVATE) 1.61 % Apply 1 application topically 2 (two) times daily as needed (rash/itching).   Yes [provider]  Cyanocobalamin (B-12) 5000 MCG CAPS Take 5,000 mcg by mouth daily.   Yes [provider]  lidocaine-prilocaine (EMLA) cream Apply to affected area once Patient taking differently: Apply 1 application topically daily as needed (port access). 04/15/20  Yes Magrinat, Virgie Dad, MD  olmesartan-hydrochlorothiazide (BENICAR HCT) 40-25 MG tablet Take 1 tablet by mouth every morning. 06/13/19  Yes [provider]  prochlorperazine (COMPAZINE) 10 MG tablet Take 1 tablet (10 mg total) by mouth every 6 (six) hours as needed (Nausea or vomiting). 04/15/20  Yes Magrinat, Virgie Dad, MD  traMADol (ULTRAM) 50 MG tablet Take 2 tablets (100 mg total) by mouth every 6 (six) hours as needed. Patient not taking: Reported on 06/08/2020 04/23/20   Rolm Bookbinder, MD  buPROPion (WELLBUTRIN XL) 300 MG 24 hr tablet Take 1 tablet (300 mg total) by mouth every morning. 10/04/13 02/17/20  [name], MD  losartan-hydrochlorothiazide [facility]) 100-12.5 MG per tablet Take 1/2 - 1 tab daily 10/04/13 02/17/20  Zanard, Bernadene Bell, MD  telmisartan-hydrochlorothiazide (MICARDIS HCT) 80-25 MG tablet Take 1 tablet by mouth daily.  02/17/20  [provider]    Allergies    Patient has no known allergies.  Review of Systems   Review of Systems  Constitutional: Negative for chills, diaphoresis and fever.       Felt flushed, "hot all over".  HENT: Negative.   Eyes: Negative for visual disturbance.  Respiratory: Negative.  Negative for shortness of breath.    Cardiovascular: Negative.  Negative for chest pain.  Gastrointestinal: Negative.  Negative for nausea.  Musculoskeletal: Negative.   Skin: Negative.  Negative for color change.  Neurological: Positive for dizziness and light-headedness. Negative for syncope, weakness and headaches.    Physical Exam Updated Vital Signs BP 101/62   Pulse 69   Temp 98.8 F (37.1 C) (Oral)   Resp (!) 21   Ht 5\' 9"  (1.753 m)   Wt 95.7 kg   SpO2 100%   BMI 31.16 kg/m   Physical Exam Vitals and nursing note reviewed.  Constitutional:      Appearance: She is well-developed.  HENT:  Head: Normocephalic.  Cardiovascular:     Rate and Rhythm: Normal rate and regular rhythm.     Heart sounds: No murmur heard.   Pulmonary:     Effort: Pulmonary effort is normal.     Breath sounds: Normal breath sounds. No wheezing, rhonchi or rales.  Abdominal:     General: Bowel sounds are normal.     Palpations: Abdomen is soft.     Tenderness: There is no abdominal tenderness. There is no guarding or rebound.  Musculoskeletal:        General: Normal range of motion.     Cervical back: Normal range of motion and neck supple.  Skin:    General: Skin is warm and dry.     Findings: No rash.  Neurological:     General: No focal deficit present.     Mental Status: She is alert and oriented to person, place, and time.     ED Results / Procedures / Treatments   Labs (all labs ordered are listed, but only abnormal results are displayed) Labs Reviewed  CBC WITH DIFFERENTIAL/PLATELET  BASIC METABOLIC PANEL  CBG MONITORING, ED    EKG None  Radiology No results found.  Procedures Procedures   Medications Ordered in ED Medications  sodium chloride 0.9 % bolus 1,000 mL (1,000 mLs Intravenous New Bag/Given 06/08/20 0419)    ED Course  I have reviewed the triage vital signs and the nursing notes.  Pertinent labs & imaging results that were available during my care of the patient were reviewed by  me and considered in my medical decision making (see chart for details).    MDM Rules/Calculators/A&P                          Patient to ED with ss/sxs as per HPI.  She appears comfortable. Currently asymptomatic. Blood pressure 101/62. Suspect vasovagal symptoms given history of same, continuation of blood pressure medication despite previous hypotension. IVF's ordered. Will reassess.   The patient is feeling much better. BP 156/97. She has ambulated to the bathroom and back without developing symptoms. EKG NSR. Labs are reassuring.  She will stop taking her blood pressure medication (currently on 1/4 dose) until seen by her PCP. She will keep a blood pressure journal to review with Dr. Dion Saucier.  Final Clinical Impression(s) / ED Diagnoses Final diagnoses:  None   1. Vasovagal symptoms  Rx / DC Orders ED Discharge Orders    None       Charlann Lange, PA-C 06/08/20 0646    Shanon Rosser, MD 06/08/20 [phone]

## 2020-06-08 NOTE — ED Triage Notes (Signed)
Pt came in with c/o dizziness. Pt has breast cancer and has been getting chemo since Feb. She states she went into work at 11p. She started to feel hot and dizzy at work so she sat down. EMS was called and they performed and ECG, vitals and BGL. BGL here is 99. Pt is not diabetic. She is menopausal. Pt refused to go to the hospital but she had another episode.

## 2020-06-08 NOTE — Discharge Instructions (Signed)
Call to make an appointment with Dr. Dion Saucier as we talked about to re-evaluate your blood pressure treatment. Keep a journal of 3-times-a-day blood pressure measurement to review with her during that appointment. You can stop your medication until further advised by your doctor.   If symptoms of lightheadedness happen again, please return to the emergency department for further evaluation.

## 2020-06-08 NOTE — Progress Notes (Signed)
Received approval letter from [employer] for [employer].   Patient approved for $25,000 03/14/20 - 03/13/21 leaving her with a maximum out of pocket of $25 after her insurance pays.  A copy of the approval letter given to [facility] for billing/copay claim submisisons.  A copy will also be mailed to patient.  She has my card for any additional financial questions or concerns.

## 2020-06-12 ENCOUNTER — Inpatient Hospital Stay: Payer: BC Managed Care – PPO

## 2020-06-12 ENCOUNTER — Other Ambulatory Visit: Payer: Self-pay

## 2020-06-12 ENCOUNTER — Inpatient Hospital Stay: Payer: BC Managed Care – PPO | Attending: Oncology

## 2020-06-12 VITALS — BP 123/74 | HR 85 | Temp 98.2°F | Resp 18 | Wt 216.5 lb

## 2020-06-12 DIAGNOSIS — Z8249 Family history of ischemic heart disease and other diseases of the circulatory system: Secondary | ICD-10-CM | POA: Diagnosis not present

## 2020-06-12 DIAGNOSIS — C50411 Malignant neoplasm of upper-outer quadrant of right female breast: Secondary | ICD-10-CM | POA: Diagnosis present

## 2020-06-12 DIAGNOSIS — Z5111 Encounter for antineoplastic chemotherapy: Secondary | ICD-10-CM | POA: Diagnosis present

## 2020-06-12 DIAGNOSIS — C7951 Secondary malignant neoplasm of bone: Secondary | ICD-10-CM | POA: Diagnosis not present

## 2020-06-12 DIAGNOSIS — Z803 Family history of malignant neoplasm of breast: Secondary | ICD-10-CM | POA: Diagnosis not present

## 2020-06-12 DIAGNOSIS — Z833 Family history of diabetes mellitus: Secondary | ICD-10-CM | POA: Insufficient documentation

## 2020-06-12 DIAGNOSIS — Z801 Family history of malignant neoplasm of trachea, bronchus and lung: Secondary | ICD-10-CM | POA: Insufficient documentation

## 2020-06-12 DIAGNOSIS — Z90710 Acquired absence of both cervix and uterus: Secondary | ICD-10-CM | POA: Insufficient documentation

## 2020-06-12 DIAGNOSIS — Z79899 Other long term (current) drug therapy: Secondary | ICD-10-CM | POA: Diagnosis not present

## 2020-06-12 DIAGNOSIS — Z7982 Long term (current) use of aspirin: Secondary | ICD-10-CM | POA: Diagnosis not present

## 2020-06-12 DIAGNOSIS — Z8042 Family history of malignant neoplasm of prostate: Secondary | ICD-10-CM | POA: Insufficient documentation

## 2020-06-12 DIAGNOSIS — C773 Secondary and unspecified malignant neoplasm of axilla and upper limb lymph nodes: Secondary | ICD-10-CM | POA: Diagnosis not present

## 2020-06-12 DIAGNOSIS — Z170 Estrogen receptor positive status [ER+]: Secondary | ICD-10-CM | POA: Diagnosis not present

## 2020-06-12 DIAGNOSIS — Z87891 Personal history of nicotine dependence: Secondary | ICD-10-CM | POA: Diagnosis not present

## 2020-06-12 DIAGNOSIS — Z5112 Encounter for antineoplastic immunotherapy: Secondary | ICD-10-CM | POA: Insufficient documentation

## 2020-06-12 DIAGNOSIS — Z95828 Presence of other vascular implants and grafts: Secondary | ICD-10-CM

## 2020-06-12 LAB — CBC WITH DIFFERENTIAL (CANCER CENTER ONLY)
Abs Immature Granulocytes: 0.01 10*3/uL (ref 0.00–0.07)
Basophils Absolute: 0 10*3/uL (ref 0.0–0.1)
Basophils Relative: 0 %
Eosinophils Absolute: 0 10*3/uL (ref 0.0–0.5)
Eosinophils Relative: 1 %
HCT: 26.8 % — ABNORMAL LOW (ref 36.0–46.0)
Hemoglobin: 8.6 g/dL — ABNORMAL LOW (ref 12.0–15.0)
Immature Granulocytes: 0 %
Lymphocytes Relative: 43 %
Lymphs Abs: 1.2 10*3/uL (ref 0.7–4.0)
MCH: 29.7 pg (ref 26.0–34.0)
MCHC: 32.1 g/dL (ref 30.0–36.0)
MCV: 92.4 fL (ref 80.0–100.0)
Monocytes Absolute: 0.2 10*3/uL (ref 0.1–1.0)
Monocytes Relative: 8 %
Neutro Abs: 1.3 10*3/uL — ABNORMAL LOW (ref 1.7–7.7)
Neutrophils Relative %: 48 %
Platelet Count: 214 10*3/uL (ref 150–400)
RBC: 2.9 MIL/uL — ABNORMAL LOW (ref 3.87–5.11)
RDW: 15.9 % — ABNORMAL HIGH (ref 11.5–15.5)
WBC Count: 2.7 10*3/uL — ABNORMAL LOW (ref 4.0–10.5)
nRBC: 0 % (ref 0.0–0.2)

## 2020-06-12 LAB — CMP (CANCER CENTER ONLY)
ALT: 21 U/L (ref 0–44)
AST: 18 U/L (ref 15–41)
Albumin: 3.6 g/dL (ref 3.5–5.0)
Alkaline Phosphatase: 101 U/L (ref 38–126)
Anion gap: 7 (ref 5–15)
BUN: 11 mg/dL (ref 6–20)
CO2: 24 mmol/L (ref 22–32)
Calcium: 9 mg/dL (ref 8.9–10.3)
Chloride: 112 mmol/L — ABNORMAL HIGH (ref 98–111)
Creatinine: 0.75 mg/dL (ref 0.44–1.00)
GFR, Estimated: >60 mL/min (ref >60)
Glucose, Bld: 105 mg/dL — ABNORMAL HIGH (ref 70–99)
Potassium: 4.2 mmol/L (ref 3.5–5.1)
Sodium: 143 mmol/L (ref 135–145)
Total Bilirubin: 0.2 mg/dL — ABNORMAL LOW (ref 0.3–1.2)
Total Protein: 6.4 g/dL — ABNORMAL LOW (ref 6.5–8.1)

## 2020-06-12 LAB — TSH: TSH: 1.361 u[IU]/mL (ref 0.308–3.960)

## 2020-06-12 MED ORDER — DIPHENHYDRAMINE HCL 50 MG/ML IJ SOLN
25.0000 mg | Freq: Once | INTRAMUSCULAR | Status: AC
  Administered 2020-06-12: 25 mg via INTRAVENOUS

## 2020-06-12 MED ORDER — SODIUM CHLORIDE 0.9 % IV SOLN
Freq: Once | INTRAVENOUS | Status: AC
  Filled 2020-06-12: qty 250

## 2020-06-12 MED ORDER — FAMOTIDINE IN NACL 20-0.9 MG/50ML-% IV SOLN
INTRAVENOUS | Status: AC
  Filled 2020-06-12: qty 50

## 2020-06-12 MED ORDER — FAMOTIDINE IN NACL 20-0.9 MG/50ML-% IV SOLN
20.0000 mg | Freq: Once | INTRAVENOUS | Status: AC
  Administered 2020-06-12: 20 mg via INTRAVENOUS

## 2020-06-12 MED ORDER — DIPHENHYDRAMINE HCL 50 MG/ML IJ SOLN
INTRAMUSCULAR | Status: AC
  Filled 2020-06-12: qty 1

## 2020-06-12 MED ORDER — HEPARIN SOD (PORK) LOCK FLUSH 100 UNIT/ML IV SOLN
500.0000 [IU] | Freq: Once | INTRAVENOUS | Status: AC | PRN
  Administered 2020-06-12: 500 [IU]
  Filled 2020-06-12: qty 5

## 2020-06-12 MED ORDER — SODIUM CHLORIDE 0.9 % IV SOLN
10.0000 mg | Freq: Once | INTRAVENOUS | Status: AC
  Administered 2020-06-12: 10 mg via INTRAVENOUS
  Filled 2020-06-12: qty 10

## 2020-06-12 MED ORDER — SODIUM CHLORIDE 0.9% FLUSH
10.0000 mL | INTRAVENOUS | Status: DC | PRN
  Administered 2020-06-12: 10 mL
  Filled 2020-06-12: qty 10

## 2020-06-12 MED ORDER — SODIUM CHLORIDE 0.9 % IV SOLN
80.0000 mg/m2 | Freq: Once | INTRAVENOUS | Status: AC
  Administered 2020-06-12: 174 mg via INTRAVENOUS
  Filled 2020-06-12: qty 29

## 2020-06-12 MED ORDER — SODIUM CHLORIDE 0.9% FLUSH
10.0000 mL | Freq: Once | INTRAVENOUS | Status: AC
  Administered 2020-06-12: 10 mL via INTRAVENOUS
  Filled 2020-06-12: qty 10

## 2020-06-12 NOTE — Progress Notes (Signed)
Ok to proceed with [facility] Taxol with ANC of 1.3 per Dr. Burr Medico.

## 2020-06-13 LAB — T4: T4, Total: 7 ug/dL (ref 4.5–12.0)

## 2020-06-19 ENCOUNTER — Inpatient Hospital Stay: Payer: BC Managed Care – PPO

## 2020-06-19 ENCOUNTER — Other Ambulatory Visit: Payer: Self-pay

## 2020-06-19 VITALS — BP 124/75 | HR 88 | Temp 98.5°F | Resp 18 | Wt 214.0 lb

## 2020-06-19 DIAGNOSIS — Z170 Estrogen receptor positive status [ER+]: Secondary | ICD-10-CM

## 2020-06-19 DIAGNOSIS — C50411 Malignant neoplasm of upper-outer quadrant of right female breast: Secondary | ICD-10-CM

## 2020-06-19 DIAGNOSIS — Z95828 Presence of other vascular implants and grafts: Secondary | ICD-10-CM

## 2020-06-19 LAB — CBC WITH DIFFERENTIAL (CANCER CENTER ONLY)
Abs Immature Granulocytes: 0.02 10*3/uL (ref 0.00–0.07)
Basophils Absolute: 0 10*3/uL (ref 0.0–0.1)
Basophils Relative: 0 %
Eosinophils Absolute: 0 10*3/uL (ref 0.0–0.5)
Eosinophils Relative: 1 %
HCT: 29.4 % — ABNORMAL LOW (ref 36.0–46.0)
Hemoglobin: 9.3 g/dL — ABNORMAL LOW (ref 12.0–15.0)
Immature Granulocytes: 1 %
Lymphocytes Relative: 40 %
Lymphs Abs: 1.6 10*3/uL (ref 0.7–4.0)
MCH: 29.9 pg (ref 26.0–34.0)
MCHC: 31.6 g/dL (ref 30.0–36.0)
MCV: 94.5 fL (ref 80.0–100.0)
Monocytes Absolute: 0.6 10*3/uL (ref 0.1–1.0)
Monocytes Relative: 15 %
Neutro Abs: 1.8 10*3/uL (ref 1.7–7.7)
Neutrophils Relative %: 43 %
Platelet Count: 250 10*3/uL (ref 150–400)
RBC: 3.11 MIL/uL — ABNORMAL LOW (ref 3.87–5.11)
RDW: 16.8 % — ABNORMAL HIGH (ref 11.5–15.5)
WBC Count: 4 10*3/uL (ref 4.0–10.5)
nRBC: 1 % — ABNORMAL HIGH (ref 0.0–0.2)

## 2020-06-19 LAB — CMP (CANCER CENTER ONLY)
ALT: 23 U/L (ref 0–44)
AST: 18 U/L (ref 15–41)
Albumin: 3.8 g/dL (ref 3.5–5.0)
Alkaline Phosphatase: 111 U/L (ref 38–126)
Anion gap: 11 (ref 5–15)
BUN: 18 mg/dL (ref 6–20)
CO2: 22 mmol/L (ref 22–32)
Calcium: 9.3 mg/dL (ref 8.9–10.3)
Chloride: 110 mmol/L (ref 98–111)
Creatinine: 0.75 mg/dL (ref 0.44–1.00)
GFR, Estimated: >60 mL/min (ref >60)
Glucose, Bld: 106 mg/dL — ABNORMAL HIGH (ref 70–99)
Potassium: 4.3 mmol/L (ref 3.5–5.1)
Sodium: 143 mmol/L (ref 135–145)
Total Bilirubin: <0.2 mg/dL — ABNORMAL LOW (ref 0.3–1.2)
Total Protein: 6.9 g/dL (ref 6.5–8.1)

## 2020-06-19 LAB — TSH: TSH: 1.083 u[IU]/mL (ref 0.308–3.960)

## 2020-06-19 MED ORDER — FAMOTIDINE IN NACL 20-0.9 MG/50ML-% IV SOLN
20.0000 mg | Freq: Once | INTRAVENOUS | Status: AC
  Administered 2020-06-19: 20 mg via INTRAVENOUS

## 2020-06-19 MED ORDER — SODIUM CHLORIDE 0.9 % IV SOLN
Freq: Once | INTRAVENOUS | Status: AC
  Filled 2020-06-19: qty 250

## 2020-06-19 MED ORDER — SODIUM CHLORIDE 0.9% FLUSH
10.0000 mL | INTRAVENOUS | Status: DC | PRN
  Administered 2020-06-19: 10 mL
  Filled 2020-06-19: qty 10

## 2020-06-19 MED ORDER — DIPHENHYDRAMINE HCL 50 MG/ML IJ SOLN
25.0000 mg | Freq: Once | INTRAMUSCULAR | Status: AC
Start: 2020-06-19 — End: 2020-06-19
  Administered 2020-06-19: 25 mg via INTRAVENOUS

## 2020-06-19 MED ORDER — FAMOTIDINE IN NACL 20-0.9 MG/50ML-% IV SOLN
INTRAVENOUS | Status: AC
  Filled 2020-06-19: qty 50

## 2020-06-19 MED ORDER — HEPARIN SOD (PORK) LOCK FLUSH 100 UNIT/ML IV SOLN
500.0000 [IU] | Freq: Once | INTRAVENOUS | Status: AC | PRN
  Administered 2020-06-19: 500 [IU]
  Filled 2020-06-19: qty 5

## 2020-06-19 MED ORDER — SODIUM CHLORIDE 0.9% FLUSH
10.0000 mL | INTRAVENOUS | Status: DC | PRN
  Administered 2020-06-19: 10 mL via INTRAVENOUS
  Filled 2020-06-19: qty 10

## 2020-06-19 MED ORDER — DIPHENHYDRAMINE HCL 50 MG/ML IJ SOLN
INTRAMUSCULAR | Status: AC
  Filled 2020-06-19: qty 1

## 2020-06-19 MED ORDER — SODIUM CHLORIDE 0.9 % IV SOLN
10.0000 mg | Freq: Once | INTRAVENOUS | Status: AC
  Administered 2020-06-19: 10 mg via INTRAVENOUS
  Filled 2020-06-19: qty 10

## 2020-06-19 MED ORDER — SODIUM CHLORIDE 0.9 % IV SOLN
80.0000 mg/m2 | Freq: Once | INTRAVENOUS | Status: AC
  Administered 2020-06-19: 174 mg via INTRAVENOUS
  Filled 2020-06-19: qty 29

## 2020-06-19 NOTE — Patient Instructions (Signed)
Implanted Port Insertion, Care After This sheet gives you information about how to care for yourself after your procedure. Your health care provider may also give you more specific instructions. If you have problems or questions, contact your health care provider. What can I expect after the procedure? After the procedure, it is common to have:  Discomfort at the port insertion site.  Bruising on the skin over the port. This should improve over 3-4 days. Follow these instructions at home: Port care  After your port is placed, you will get a manufacturer's information card. The card has information about your port. Keep this card with you at all times.  Take care of the port as told by your health care provider. Ask your health care provider if you or a family member can get training for taking care of the port at home. A home health care nurse may also take care of the port.  Make sure to remember what type of port you have. Incision care  Follow instructions from your health care provider about how to take care of your port insertion site. Make sure you: ? Wash your hands with soap and water before and after you change your bandage (dressing). If soap and water are not available, use hand sanitizer. ? Change your dressing as told by your health care provider. ? Leave stitches (sutures), skin glue, or adhesive strips in place. These skin closures may need to stay in place for 2 weeks or longer. If adhesive strip edges start to loosen and curl up, you may trim the loose edges. Do not remove adhesive strips completely unless your health care provider tells you to do that.  Check your port insertion site every day for signs of infection. Check for: ? Redness, swelling, or pain. ? Fluid or blood. ? Warmth. ? Pus or a bad smell.      Activity  Return to your normal activities as told by your health care provider. Ask your health care provider what activities are safe for you.  Do not  lift anything that is heavier than 10 lb (4.5 kg), or the limit that you are told, until your health care provider says that it is safe. General instructions  Take over-the-counter and prescription medicines only as told by your health care provider.  Do not take baths, swim, or use a hot tub until your health care provider approves. Ask your health care provider if you may take showers. You may only be allowed to take sponge baths.  Do not drive for 24 hours if you were given a sedative during your procedure.  Wear a medical alert bracelet in case of an emergency. This will tell any health care providers that you have a port.  Keep all follow-up visits as told by your health care provider. This is important. Contact a health care provider if:  You cannot flush your port with saline as directed, or you cannot draw blood from the port.  You have a fever or chills.  You have redness, swelling, or pain around your port insertion site.  You have fluid or blood coming from your port insertion site.  Your port insertion site feels warm to the touch.  You have pus or a bad smell coming from the port insertion site. Get help right away if:  You have chest pain or shortness of breath.  You have bleeding from your port that you cannot control. Summary  Take care of the port as told by your   health care provider. Keep the manufacturer's information card with you at all times.  Change your dressing as told by your health care provider.  Contact a health care provider if you have a fever or chills or if you have redness, swelling, or pain around your port insertion site.  Keep all follow-up visits as told by your health care provider. This information is not intended to replace advice given to you by your health care provider. Make sure you discuss any questions you have with your health care provider. Document Revised: 09/26/2017 Document Reviewed: 09/26/2017 Elsevier Patient Education   2021 [employer] Inc[employer]  

## 2020-06-19 NOTE — Patient Instructions (Signed)
[facility] Discharge Instructions for Patients Receiving Chemotherapy  Today you received the following chemotherapy agents:  Taxol.  To help prevent nausea and vomiting after your treatment, we encourage you to take your nausea medication as directed.   If you develop nausea and vomiting that is not controlled by your nausea medication, call the clinic.   BELOW ARE SYMPTOMS THAT SHOULD BE REPORTED IMMEDIATELY:  *FEVER GREATER THAN 100.5 F  *CHILLS WITH OR WITHOUT FEVER  NAUSEA AND VOMITING THAT IS NOT CONTROLLED WITH YOUR NAUSEA MEDICATION  *UNUSUAL SHORTNESS OF BREATH  *UNUSUAL BRUISING OR BLEEDING  TENDERNESS IN MOUTH AND THROAT WITH OR WITHOUT PRESENCE OF ULCERS  *URINARY PROBLEMS  *BOWEL PROBLEMS  UNUSUAL RASH Items with * indicate a potential emergency and should be followed up as soon as possible.  Feel free to call the clinic should you have any questions or concerns. The clinic phone number is [phone].  Please show the CHEMO ALERT CARD at check-in to the Emergency Department and triage nurse.   

## 2020-06-20 ENCOUNTER — Inpatient Hospital Stay: Payer: BC Managed Care – PPO

## 2020-06-20 ENCOUNTER — Other Ambulatory Visit: Payer: Self-pay

## 2020-06-20 VITALS — BP 118/78 | HR 82 | Temp 96.7°F | Resp 18

## 2020-06-20 DIAGNOSIS — C50411 Malignant neoplasm of upper-outer quadrant of right female breast: Secondary | ICD-10-CM

## 2020-06-20 LAB — T4: T4, Total: 7.2 ug/dL (ref 4.5–12.0)

## 2020-06-20 MED ORDER — FILGRASTIM-AAFI 480 MCG/0.8ML IJ SOSY
480.0000 ug | PREFILLED_SYRINGE | Freq: Once | INTRAMUSCULAR | Status: AC
  Administered 2020-06-20: 480 ug via SUBCUTANEOUS

## 2020-06-20 NOTE — Progress Notes (Signed)
[name] presents today for injection per the provider's orders. Confirmed that she continues taking Claritin. Denies pain today.  Nivestym administration without incident to Left Arm; injection site WNL; see MAR for injection details.  Patient remained stabled during administration.  No questions or complaints noted at this time. Patient discharged alert and ambulatory.

## 2020-06-22 ENCOUNTER — Other Ambulatory Visit: Payer: Self-pay

## 2020-06-22 ENCOUNTER — Encounter: Payer: Self-pay | Admitting: *Deleted

## 2020-06-22 ENCOUNTER — Inpatient Hospital Stay: Payer: BC Managed Care – PPO

## 2020-06-22 VITALS — BP 124/76 | HR 99 | Temp 98.2°F | Resp 18

## 2020-06-22 DIAGNOSIS — C50411 Malignant neoplasm of upper-outer quadrant of right female breast: Secondary | ICD-10-CM

## 2020-06-22 DIAGNOSIS — Z170 Estrogen receptor positive status [ER+]: Secondary | ICD-10-CM

## 2020-06-22 MED ORDER — FILGRASTIM-AAFI 480 MCG/0.8ML IJ SOSY
480.0000 ug | PREFILLED_SYRINGE | Freq: Once | INTRAMUSCULAR | Status: AC
  Administered 2020-06-22: 480 ug via SUBCUTANEOUS
  Filled 2020-06-22: qty 0.8

## 2020-06-23 ENCOUNTER — Inpatient Hospital Stay: Payer: BC Managed Care – PPO

## 2020-06-23 VITALS — BP 130/77 | HR 103 | Temp 98.2°F | Resp 20

## 2020-06-23 DIAGNOSIS — C50411 Malignant neoplasm of upper-outer quadrant of right female breast: Secondary | ICD-10-CM

## 2020-06-23 MED ORDER — FILGRASTIM-AAFI 480 MCG/0.8ML IJ SOSY
480.0000 ug | PREFILLED_SYRINGE | Freq: Once | INTRAMUSCULAR | Status: AC
  Administered 2020-06-23: 480 ug via SUBCUTANEOUS
  Filled 2020-06-23: qty 0.8

## 2020-06-23 NOTE — Patient Instructions (Signed)

## 2020-06-24 NOTE — Progress Notes (Signed)
[name]  [phone] Fax:([phone]     ID: [name] DOB: 1965/12/04  MR#: [id number]  QQI#:[id number]  Patient Care Team: [name], MD as PCP - General (Family Medicine) Mauro Kaufmann, RN as Oncology Nurse Navigator Rockwell Germany, RN as Oncology Nurse Navigator Rolm Bookbinder, MD as Consulting Physician (General Surgery) Erline Siddoway, Virgie Dad, MD as Consulting Physician (Oncology) Kyung Rudd, MD as Consulting Physician (Radiation Oncology) Cheri Fowler, MD as Consulting Physician (Obstetrics and Gynecology) [name] Saas, MD as Consulting Physician (Orthopedic Surgery) Juanita Craver, MD as Consulting Physician (Gastroenterology) Chauncey Cruel, MD OTHER MD:  CHIEF COMPLAINT: Triple negative breast cancer  CURRENT TREATMENT: Neoadjuvant chemotherapy   INTERVAL HISTORY: [name] returns today for follow up and treatment of her 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    She is receiving Paclitaxel, Carboplatin, and Pembrolizumab.  She receives the Pembrolizumab and Carboplatin on day 1 of a 21 day cycle, and Paclitaxel on days 1, 8, and 15.  She receives fill gastrium for 3 days after the day 15 paclitaxel treatment.    Today is day 1 cycle 4.  She has tolerated chemotherapy remarkably well and will be done I hope with the chemo after completion of the cycle  REVIEW OF SYSTEMS: [name] had a fainting episode, without loss of consciousness, at work.  She had was taken off her antihypertensives except for metoprolol.  She has had no further problems from that.  She is hydrating herself well, eats normally, has no nausea or vomiting, no unusual headaches visual changes cough phlegm production pleurisy shortness of breath or change in bowel or bladder habits.  There has been absolutely no peripheral neuropathy today.   COVID 19 VACCINATION STATUS: fully vaccinated Therapist, music), with booster   HISTORY OF CURRENT ILLNESS: From the  original intake note:  [name] had routine screening mammography showing a possible abnormality in the right breast and a prominent intramammary lymph node. She underwent right diagnostic mammography with tomography and right breast ultrasonography at [facility] on 03/26/2020 showing: breast density category B; palpable 1.8 cm right breast mass at 10 o'clock, which feels much larger than what is visualized sonographically; suspicious 0.6 cm intramammary lymph node in right breast at 10 o'clock; at least 3 morphologically abnormal lymph nodes in right axilla.  Accordingly on 03/27/2020 she proceeded to biopsy of the right breast areas in question. The pathology from this procedure ([id number]) showed:  1. Right Breast, 10 o'clock (mass)  - invasive mammary carcinoma, e-cadherin positive with a lobular growth pattern, grade 2  - mammary carcinoma in situ  - Prognostic indicators significant for: estrogen receptor, 1% positive and progesterone receptor, 1% positive, both with strong staining intensity. Proliferation marker Ki67 at 30%. HER2 negative by immunohistochemistry (0). 2. Right Breast, 10 o'clock (intramammary node)  - mammary carcinoma involving lymph node and perinodal soft tissue  -  Prognostic indicators significant for: estrogen receptor, 1% positive with strong staining intensity and progesterone receptor, 0% negative. Proliferation marker Ki67 at 40%. HER2 negative by immunohistochemistry (0).  Cancer Staging Malignant neoplasm of upper-outer quadrant of right breast in female, estrogen receptor positive ([name]) Staging form: Breast, AJCC 8th Edition - Pathologic stage from 03/31/2020: Stage IV (cM1) - Signed by Gardenia Phlegm, NP on 05/01/2020 - Clinical stage from 04/08/2020: Stage IIB (cT1c, cN1(f), cM0, G2, ER-, PR-, HER2-) - Signed by Chauncey Cruel, MD on 04/08/2020 Stage prefix: Initial diagnosis Method of lymph node assessment: Core  biopsy  The patient's  subsequent history is as detailed below.   PAST MEDICAL HISTORY: Past Medical History:  Diagnosis Date  . Allergy   . Arthritis 2018   hands, left knee  . Breast cancer ([place]) 03-27-2020   right breast IMC  . Eczema   . Family history of breast cancer   . Family history of lung cancer   . Family history of prostate cancer   . Hypertension     PAST SURGICAL HISTORY: Past Surgical History:  Procedure Laterality Date  . ABDOMINAL HYSTERECTOMY    . CESAREAN SECTION    . KNEE ARTHROSCOPY W/ ACL RECONSTRUCTION Left   . PORTACATH PLACEMENT Left 04/23/2020   Procedure: LEFT SIDE PORT PLACEMENT WITH ULTRASOUND GUIDANCE;  Surgeon: Rolm Bookbinder, MD;  Location: [facility];  Service: General;  Laterality: Left;  230PM START TIME PLEASE ROOM 2 THANKS  . TUBAL LIGATION      FAMILY HISTORY: Family History  Problem Relation Age of Onset  . Breast cancer Mother 25  . Arthritis Mother   . Diabetes Father   . Kidney disease Father   . Hypertension Sister   . Heart disease Maternal Grandmother   . Breast cancer Paternal Grandmother        dx >50  . Heart disease Maternal Aunt   . Breast cancer Maternal Aunt 77  . Other Maternal Aunt        brain tumor (not cancerous)  . Lung cancer Maternal Aunt   . Prostate cancer Cousin 31       localized   Her parents are both living, her father age 46 and mother age 59, as of 03/2020. [name] has 1 brother and 2 sisters. She reports breast cancer in her mother at age 78, her maternal aunt, and her paternal grandmother. Her maternal aunt also has a history of lung cancer. She also reports prostate cancer in a maternal cousin in his 52's.   GYNECOLOGIC HISTORY:  No LMP recorded. Patient has had a hysterectomy. Menarche: 55 years old Age at first live birth: 55 years old [name] 2 LMP 2010 with hysterectomy Contraceptive: prior use without issue HRT never used  Hysterectomy? Yes, 2010 for fibroids BSO? 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 She is divorced. She lives at home with son [name], age 25, who works for [employer]. Son [place], [name], age 43, works as a Development worker, international aid in [place], [place]. [name] has no grandchildren. She attends [employer].    ADVANCED DIRECTIVES: not in place; considering naming mother Melody Haver, son [name], or son [name] as healthcare powers of attorney   HEALTH MAINTENANCE: Social History   Tobacco Use  . Smoking status: Former Smoker    Packs/day: 0.25    Years: 36.00    Pack years: 9.00    Types: Cigarettes    Quit date: 04/22/2017    Years since quitting: 3.1  . Smokeless tobacco: Never Used  . Tobacco comment: She is trying to quit.    Substance Use Topics  . Alcohol use: Yes    Alcohol/week: 3.0 standard drinks    Types: 3 Glasses of wine per week    Comment: Occasional   . Drug use: Never     Colonoscopy: age 59  PAP: date unsure  Bone density: never done   No Known Allergies  Current Outpatient Medications  Medication Sig Dispense Refill  .  acetaminophen (TYLENOL) 500 MG tablet Take 1,000 mg by mouth every 6 (six) hours as needed for moderate pain.    Marland Kitchen aspirin EC 81 MG tablet Take 81 mg by mouth daily. Swallow whole.    . Boric Acid Vaginal 600 MG SUPP Place 600 mg vaginally daily as needed (yeast infections).    . Cholecalciferol (VITAMIN D) 125 MCG (5000 UT) CAPS Take 5,000 Units by mouth daily.    . clobetasol ointment (TEMOVATE) 0.05 % Apply 1 application topically 2 (two) times daily as needed (rash/itching).    . Cyanocobalamin (B-12) 5000 MCG CAPS Take 5,000 mcg by mouth daily.    Marland Kitchen lidocaine-prilocaine (EMLA) cream Apply to affected area once (Patient taking differently: Apply 1 application topically daily as needed (port access).) 30 Franco 3  . olmesartan-hydrochlorothiazide (BENICAR HCT) 40-25 MG tablet Take 1 tablet by mouth every morning.    .  prochlorperazine (COMPAZINE) 10 MG tablet Take 1 tablet (10 mg total) by mouth every 6 (six) hours as needed (Nausea or vomiting). 30 tablet 1  . propranolol (INDERAL) 10 MG tablet Take 10 mg by mouth 2 (two) times daily.    . traMADol (ULTRAM) 50 MG tablet Take 2 tablets (100 mg total) by mouth every 6 (six) hours as needed. (Patient not taking: Reported on 06/08/2020) 10 tablet 0   No current facility-administered medications for this visit.    OBJECTIVE: African-American woman in no acute distress  Vitals:   06/25/20 0810  BP: 131/71  Pulse: 77  Resp: 16  Temp: (!) 97.2 F (36.2 C)  SpO2: 100%     Body mass index is 32.19 kg/m.   Wt Readings from Last 3 Encounters:  06/25/20 218 lb (98.9 kg)  06/19/20 214 lb (97.1 kg)  06/12/20 216 lb 8 oz (98.2 kg)      ECOG FS:1 - Symptomatic but completely ambulatory  Sclerae unicteric, EOMs intact Wearing a mask No cervical or supraclavicular adenopathy Lungs no rales or rhonchi Heart regular rate and rhythm Abd soft, nontender, positive bowel sounds MSK no focal spinal tenderness, no upper extremity lymphedema Neuro: nonfocal, well oriented, appropriate affect Breasts: I do not palpate a mass in the right breast.  Both axillae are benign.   LAB RESULTS:  CMP     Component Value Date/Time   NA 143 06/19/2020 0921   K 4.3 06/19/2020 0921   CL 110 06/19/2020 0921   CO2 22 06/19/2020 0921   GLUCOSE 106 (H) 06/19/2020 0921   BUN 18 06/19/2020 0921   CREATININE 0.75 06/19/2020 0921   CREATININE 0.69 09/24/2013 0819   CALCIUM 9.3 06/19/2020 0921   PROT 6.9 06/19/2020 0921   ALBUMIN 3.8 06/19/2020 0921   AST 18 06/19/2020 0921   ALT 23 06/19/2020 0921   ALKPHOS 111 06/19/2020 0921   BILITOT <0.2 (L) 06/19/2020 0921   GFRNONAA >60 06/19/2020 0921   GFRNONAA >89 09/24/2013 0819   GFRAA >89 09/24/2013 0819    No results found for: TOTALPROTELP, ALBUMINELP, A1GS, A2GS, BETS, BETA2SER, GAMS, MSPIKE, SPEI  Lab Results   Component Value Date   WBC 12.6 (H) 06/25/2020   NEUTROABS PENDING 06/25/2020   HGB 9.0 (L) 06/25/2020   HCT 28.3 (L) 06/25/2020   MCV 95.9 06/25/2020   PLT 212 06/25/2020    No results found for: LABCA2  No components found for: WQXTSV772  No results for input(s): INR in the last 168 hours.  No results found for: LABCA2  No results found for: ZGF183  No results found for: IRS854  No results found for: OEV035  No results found for: CA2729  No components found for: HGQUANT  No results found for: CEA1 / No results found for: CEA1   No results found for: AFPTUMOR  No results found for: CHROMOGRNA  No results found for: KPAFRELGTCHN, LAMBDASER, KAPLAMBRATIO (kappa/lambda light chains)  No results found for: HGBA, HGBA2QUANT, HGBFQUANT, HGBSQUAN (Hemoglobinopathy evaluation)   No results found for: LDH  No results found for: IRON, TIBC, IRONPCTSAT (Iron and TIBC)  No results found for: FERRITIN  Urinalysis No results found for: COLORURINE, APPEARANCEUR, LABSPEC, PHURINE, GLUCOSEU, HGBUR, BILIRUBINUR, KETONESUR, PROTEINUR, UROBILINOGEN, NITRITE, LEUKOCYTESUR   STUDIES: No results found.   ELIGIBLE FOR AVAILABLE RESEARCH PROTOCOL: no  ASSESSMENT: 55 y.o. [place] woman status post right breast upper outer quadrant biopsy 03/27/2020 for a clinical T1c N3 M1, stageIV invasive ductal carcinoma, functionally triple negative, with an MIB-1 of 30-40%  (a) chest CT scan 04/20/2020 shows in addition to the breast mass and regional adenopathy, lytic and sclerotic bone changes, possible left thoracic adenopathy, and indeterminant left pleural nodularity  (b) bone scan 04/21/2020 confirms multiple bone lesions  (c) thoracic MRI  (1) genetics testing 04/24/2020 through the [facility] panel found no deleterious mutations in APC, ATM, AXIN2, BARD1, BMPR1A, BRCA1, BRCA2, BRIP1, CDH1, CDK4, CDKN2A, CHEK2, DICER1, EPCAM, GREM1, HOXB13, MEN1, MLH1,  MSH2, MSH3, MSH6, MUTYH, NBN, NF1, NF2, NTHL1, PALB2, PMS2, POLD1, POLE, PTEN, RAD51C, RAD51D, RECQL, RET, SDHA, SDHAF2, SDHB, SDHC, SDHD, SMAD4, SMARCA4, STK11, TP53, TSC1, TSC2, and VHL.  RNA data is routinely analyzed for use in variant interpretation for all genes.  (2) neoadjuvant chemotherapy will consist of pembrolizumab every 3 weeks, carboplatin every 3 weeks x 4 with paclitaxel weekly x12, then reassess for surgery versus continue with doxorubicin and cyclophosphamide every 3 weeks x 4  (3) zoledronate started 05/08/2018, repeated every 12 weeks  (4) definitive surgery to follow  (5) adjuvant radiation   PLAN: [name] will start cycle 4 tomorrow.  We usually do only 12 doses of paclitaxel so she will get that tomorrow and then next week and the following week and I will be the end of her chemo.  She will receive Zarxio for 3 days after the last Taxol dose as she has with the prior cycles.  We now need to restage her.  She will have a breast MRI and a CT of the chest the first week in May.  She will return to see me mid May and by that point we should have a plan which likely will be to proceed with surgery, then radiation, while continuing pembrolizumab indefinitely.  We reviewed issues regarding hydration and so far the plan is to keep her of the earlier blood pressure medicines but this may need to be reassessed in the future.  Total encounter time 35 minutes.[name] C. Donelle Hise, MD 06/25/20 8:24 AM Medical Oncology and Hematology [facility], [place] [address] Tel. [phone]    Fax. [phone]   I, Wilburn Mylar, am acting as scribe for Dr. Virgie Dad. Valree Feild.  I, Lurline Del MD, have reviewed the above documentation for accuracy and completeness, and I agree with the above.    *Total Encounter Time as defined by the Centers for Medicare and Medicaid Services includes, in addition to the face-to-face time of a patient  visit (documented in the note above) non-face-to-face time: obtaining and reviewing outside history, ordering and reviewing  medications, tests or procedures, care coordination (communications with other health care professionals or caregivers) and documentation in the medical record.

## 2020-06-25 ENCOUNTER — Other Ambulatory Visit: Payer: Self-pay

## 2020-06-25 ENCOUNTER — Inpatient Hospital Stay: Payer: BC Managed Care – PPO

## 2020-06-25 ENCOUNTER — Inpatient Hospital Stay: Payer: BC Managed Care – PPO | Admitting: Oncology

## 2020-06-25 VITALS — BP 131/71 | HR 77 | Temp 97.2°F | Resp 16 | Ht 69.0 in | Wt 218.0 lb

## 2020-06-25 DIAGNOSIS — C7951 Secondary malignant neoplasm of bone: Secondary | ICD-10-CM

## 2020-06-25 DIAGNOSIS — Z170 Estrogen receptor positive status [ER+]: Secondary | ICD-10-CM

## 2020-06-25 DIAGNOSIS — C50411 Malignant neoplasm of upper-outer quadrant of right female breast: Secondary | ICD-10-CM | POA: Diagnosis not present

## 2020-06-25 LAB — CMP (CANCER CENTER ONLY)
ALT: 17 U/L (ref 0–44)
AST: 21 U/L (ref 15–41)
Albumin: 4 g/dL (ref 3.5–5.0)
Alkaline Phosphatase: 132 U/L — ABNORMAL HIGH (ref 38–126)
Anion gap: 10 (ref 5–15)
BUN: 16 mg/dL (ref 6–20)
CO2: 22 mmol/L (ref 22–32)
Calcium: 9.5 mg/dL (ref 8.9–10.3)
Chloride: 109 mmol/L (ref 98–111)
Creatinine: 0.74 mg/dL (ref 0.44–1.00)
GFR, Estimated: >60 mL/min (ref >60)
Glucose, Bld: 94 mg/dL (ref 70–99)
Potassium: 4.2 mmol/L (ref 3.5–5.1)
Sodium: 141 mmol/L (ref 135–145)
Total Bilirubin: <0.2 mg/dL — ABNORMAL LOW (ref 0.3–1.2)
Total Protein: 7.1 g/dL (ref 6.5–8.1)

## 2020-06-25 LAB — CBC WITH DIFFERENTIAL (CANCER CENTER ONLY)
Abs Immature Granulocytes: 0.37 10*3/uL — ABNORMAL HIGH (ref 0.00–0.07)
Basophils Absolute: 0.1 10*3/uL (ref 0.0–0.1)
Basophils Relative: 1 %
Eosinophils Absolute: 0 10*3/uL (ref 0.0–0.5)
Eosinophils Relative: 0 %
HCT: 28.3 % — ABNORMAL LOW (ref 36.0–46.0)
Hemoglobin: 9 g/dL — ABNORMAL LOW (ref 12.0–15.0)
Immature Granulocytes: 3 %
Lymphocytes Relative: 26 %
Lymphs Abs: 3.2 10*3/uL (ref 0.7–4.0)
MCH: 30.5 pg (ref 26.0–34.0)
MCHC: 31.8 g/dL (ref 30.0–36.0)
MCV: 95.9 fL (ref 80.0–100.0)
Monocytes Absolute: 1.9 10*3/uL — ABNORMAL HIGH (ref 0.1–1.0)
Monocytes Relative: 15 %
Neutro Abs: 7.1 10*3/uL (ref 1.7–7.7)
Neutrophils Relative %: 55 %
Platelet Count: 212 10*3/uL (ref 150–400)
RBC: 2.95 MIL/uL — ABNORMAL LOW (ref 3.87–5.11)
RDW: 18.3 % — ABNORMAL HIGH (ref 11.5–15.5)
WBC Count: 12.6 10*3/uL — ABNORMAL HIGH (ref 4.0–10.5)
nRBC: 1.1 % — ABNORMAL HIGH (ref 0.0–0.2)

## 2020-06-25 LAB — TSH: TSH: 1.242 u[IU]/mL (ref 0.308–3.960)

## 2020-06-26 ENCOUNTER — Encounter: Payer: Self-pay | Admitting: *Deleted

## 2020-06-26 ENCOUNTER — Inpatient Hospital Stay: Payer: BC Managed Care – PPO

## 2020-06-26 VITALS — BP 124/74 | HR 86 | Temp 98.2°F | Resp 18

## 2020-06-26 DIAGNOSIS — C50411 Malignant neoplasm of upper-outer quadrant of right female breast: Secondary | ICD-10-CM | POA: Diagnosis not present

## 2020-06-26 DIAGNOSIS — Z170 Estrogen receptor positive status [ER+]: Secondary | ICD-10-CM

## 2020-06-26 LAB — T4: T4, Total: 8.7 ug/dL (ref 4.5–12.0)

## 2020-06-26 MED ORDER — SODIUM CHLORIDE 0.9 % IV SOLN
10.0000 mg | Freq: Once | INTRAVENOUS | Status: AC
  Administered 2020-06-26: 10 mg via INTRAVENOUS
  Filled 2020-06-26: qty 10

## 2020-06-26 MED ORDER — DIPHENHYDRAMINE HCL 50 MG/ML IJ SOLN
INTRAMUSCULAR | Status: AC
  Filled 2020-06-26: qty 1

## 2020-06-26 MED ORDER — FAMOTIDINE IN NACL 20-0.9 MG/50ML-% IV SOLN
20.0000 mg | Freq: Once | INTRAVENOUS | Status: AC
  Administered 2020-06-26: 20 mg via INTRAVENOUS

## 2020-06-26 MED ORDER — HEPARIN SOD (PORK) LOCK FLUSH 100 UNIT/ML IV SOLN
500.0000 [IU] | Freq: Once | INTRAVENOUS | Status: AC | PRN
  Administered 2020-06-26: 500 [IU]
  Filled 2020-06-26: qty 5

## 2020-06-26 MED ORDER — SODIUM CHLORIDE 0.9 % IV SOLN
Freq: Once | INTRAVENOUS | Status: AC
  Filled 2020-06-26: qty 250

## 2020-06-26 MED ORDER — PALONOSETRON HCL INJECTION 0.25 MG/5ML
0.2500 mg | Freq: Once | INTRAVENOUS | Status: AC
  Administered 2020-06-26: 0.25 mg via INTRAVENOUS

## 2020-06-26 MED ORDER — SODIUM CHLORIDE 0.9 % IV SOLN
200.0000 mg | Freq: Once | INTRAVENOUS | Status: AC
  Administered 2020-06-26: 200 mg via INTRAVENOUS
  Filled 2020-06-26: qty 8

## 2020-06-26 MED ORDER — FAMOTIDINE IN NACL 20-0.9 MG/50ML-% IV SOLN
INTRAVENOUS | Status: AC
  Filled 2020-06-26: qty 50

## 2020-06-26 MED ORDER — PALONOSETRON HCL INJECTION 0.25 MG/5ML
INTRAVENOUS | Status: AC
  Filled 2020-06-26: qty 5

## 2020-06-26 MED ORDER — SODIUM CHLORIDE 0.9 % IV SOLN
150.0000 mg | Freq: Once | INTRAVENOUS | Status: AC
  Administered 2020-06-26: 150 mg via INTRAVENOUS
  Filled 2020-06-26: qty 150

## 2020-06-26 MED ORDER — SODIUM CHLORIDE 0.9% FLUSH
10.0000 mL | INTRAVENOUS | Status: DC | PRN
  Administered 2020-06-26: 10 mL
  Filled 2020-06-26: qty 10

## 2020-06-26 MED ORDER — DIPHENHYDRAMINE HCL 50 MG/ML IJ SOLN
25.0000 mg | Freq: Once | INTRAMUSCULAR | Status: AC
  Administered 2020-06-26: 25 mg via INTRAVENOUS

## 2020-06-26 MED ORDER — SODIUM CHLORIDE 0.9 % IV SOLN
80.0000 mg/m2 | Freq: Once | INTRAVENOUS | Status: AC
  Administered 2020-06-26: 174 mg via INTRAVENOUS
  Filled 2020-06-26: qty 29

## 2020-06-26 MED ORDER — SODIUM CHLORIDE 0.9 % IV SOLN
750.0000 mg | Freq: Once | INTRAVENOUS | Status: AC
  Administered 2020-06-26: 750 mg via INTRAVENOUS
  Filled 2020-06-26: qty 75

## 2020-06-26 NOTE — Patient Instructions (Signed)
[facility] Discharge Instructions for Patients Receiving Chemotherapy  Today you received the following chemotherapy agents pembrolizumab, paclitaxel, carboplatin.   To help prevent nausea and vomiting after your treatment, we encourage you to take your nausea medication as directed.    If you develop nausea and vomiting that is not controlled by your nausea medication, call the clinic.   BELOW ARE SYMPTOMS THAT SHOULD BE REPORTED IMMEDIATELY:  *FEVER GREATER THAN 100.5 F  *CHILLS WITH OR WITHOUT FEVER  NAUSEA AND VOMITING THAT IS NOT CONTROLLED WITH YOUR NAUSEA MEDICATION  *UNUSUAL SHORTNESS OF BREATH  *UNUSUAL BRUISING OR BLEEDING  TENDERNESS IN MOUTH AND THROAT WITH OR WITHOUT PRESENCE OF ULCERS  *URINARY PROBLEMS  *BOWEL PROBLEMS  UNUSUAL RASH Items with * indicate a potential emergency and should be followed up as soon as possible.  Feel free to call the clinic should you have any questions or concerns. The clinic phone number is [phone].  Please show the CHEMO ALERT CARD at check-in to the Emergency Department and triage nurse.   

## 2020-06-30 ENCOUNTER — Other Ambulatory Visit: Payer: Self-pay

## 2020-06-30 ENCOUNTER — Ambulatory Visit
Admission: RE | Admit: 2020-06-30 | Discharge: 2020-06-30 | Disposition: A | Discharge status: Home / Self Care | Payer: BC Managed Care – PPO | Source: Ambulatory Visit | Attending: Oncology | Admitting: Oncology

## 2020-06-30 ENCOUNTER — Telehealth: Payer: Self-pay | Admitting: Oncology

## 2020-06-30 ENCOUNTER — Encounter: Payer: Self-pay | Admitting: *Deleted

## 2020-06-30 DIAGNOSIS — Z170 Estrogen receptor positive status [ER+]: Secondary | ICD-10-CM

## 2020-06-30 DIAGNOSIS — C50411 Malignant neoplasm of upper-outer quadrant of right female breast: Secondary | ICD-10-CM

## 2020-06-30 DIAGNOSIS — C7951 Secondary malignant neoplasm of bone: Secondary | ICD-10-CM

## 2020-06-30 MED ORDER — GADOBUTROL 1 MMOL/ML IV SOLN
9.0000 mL | Freq: Once | INTRAVENOUS | Status: AC | PRN
  Administered 2020-06-30: 9 mL via INTRAVENOUS

## 2020-06-30 NOTE — Telephone Encounter (Signed)
Scheduled appts per 4/19 sch msg. Pt aware.  

## 2020-07-01 ENCOUNTER — Telehealth: Payer: Self-pay | Admitting: *Deleted

## 2020-07-01 NOTE — Telephone Encounter (Signed)
Called pt, confirmed appt with Dr. Donne Hazel on 5/13 at 9am. Discussed MRI results. No further needs or questions voiced.

## 2020-07-02 ENCOUNTER — Other Ambulatory Visit: Payer: Self-pay

## 2020-07-02 DIAGNOSIS — C7951 Secondary malignant neoplasm of bone: Secondary | ICD-10-CM

## 2020-07-02 DIAGNOSIS — C50411 Malignant neoplasm of upper-outer quadrant of right female breast: Secondary | ICD-10-CM

## 2020-07-03 ENCOUNTER — Inpatient Hospital Stay: Payer: BC Managed Care – PPO

## 2020-07-03 ENCOUNTER — Other Ambulatory Visit: Payer: Self-pay

## 2020-07-03 VITALS — BP 122/80 | HR 78 | Temp 97.9°F | Wt 215.5 lb

## 2020-07-03 DIAGNOSIS — C50411 Malignant neoplasm of upper-outer quadrant of right female breast: Secondary | ICD-10-CM | POA: Diagnosis not present

## 2020-07-03 DIAGNOSIS — Z170 Estrogen receptor positive status [ER+]: Secondary | ICD-10-CM

## 2020-07-03 LAB — CMP (CANCER CENTER ONLY)
ALT: 16 U/L (ref 0–44)
AST: 21 U/L (ref 15–41)
Albumin: 3.9 g/dL (ref 3.5–5.0)
Alkaline Phosphatase: 94 U/L (ref 38–126)
Anion gap: 7 (ref 5–15)
BUN: 17 mg/dL (ref 6–20)
CO2: 25 mmol/L (ref 22–32)
Calcium: 9.3 mg/dL (ref 8.9–10.3)
Chloride: 107 mmol/L (ref 98–111)
Creatinine: 0.69 mg/dL (ref 0.44–1.00)
GFR, Estimated: >60 mL/min (ref >60)
Glucose, Bld: 117 mg/dL — ABNORMAL HIGH (ref 70–99)
Potassium: 3.9 mmol/L (ref 3.5–5.1)
Sodium: 139 mmol/L (ref 135–145)
Total Bilirubin: 0.2 mg/dL — ABNORMAL LOW (ref 0.3–1.2)
Total Protein: 6.8 g/dL (ref 6.5–8.1)

## 2020-07-03 LAB — CBC WITH DIFFERENTIAL (CANCER CENTER ONLY)
Abs Immature Granulocytes: 0.01 10*3/uL (ref 0.00–0.07)
Basophils Absolute: 0 10*3/uL (ref 0.0–0.1)
Basophils Relative: 0 %
Eosinophils Absolute: 0 10*3/uL (ref 0.0–0.5)
Eosinophils Relative: 1 %
HCT: 26.3 % — ABNORMAL LOW (ref 36.0–46.0)
Hemoglobin: 8.4 g/dL — ABNORMAL LOW (ref 12.0–15.0)
Immature Granulocytes: 0 %
Lymphocytes Relative: 39 %
Lymphs Abs: 1.5 10*3/uL (ref 0.7–4.0)
MCH: 31 pg (ref 26.0–34.0)
MCHC: 31.9 g/dL (ref 30.0–36.0)
MCV: 97 fL (ref 80.0–100.0)
Monocytes Absolute: 0.3 10*3/uL (ref 0.1–1.0)
Monocytes Relative: 7 %
Neutro Abs: 2 10*3/uL (ref 1.7–7.7)
Neutrophils Relative %: 53 %
Platelet Count: 128 10*3/uL — ABNORMAL LOW (ref 150–400)
RBC: 2.71 MIL/uL — ABNORMAL LOW (ref 3.87–5.11)
RDW: 17.3 % — ABNORMAL HIGH (ref 11.5–15.5)
WBC Count: 3.8 10*3/uL — ABNORMAL LOW (ref 4.0–10.5)
nRBC: 0 % (ref 0.0–0.2)

## 2020-07-03 LAB — TSH: TSH: 1.357 u[IU]/mL (ref 0.308–3.960)

## 2020-07-03 MED ORDER — FAMOTIDINE IN NACL 20-0.9 MG/50ML-% IV SOLN
20.0000 mg | Freq: Once | INTRAVENOUS | Status: AC
Start: 2020-07-03 — End: 2020-07-03
  Administered 2020-07-03: 20 mg via INTRAVENOUS

## 2020-07-03 MED ORDER — SODIUM CHLORIDE 0.9 % IV SOLN
Freq: Once | INTRAVENOUS | Status: AC
  Filled 2020-07-03: qty 250

## 2020-07-03 MED ORDER — SODIUM CHLORIDE 0.9 % IV SOLN
10.0000 mg | Freq: Once | INTRAVENOUS | Status: AC
  Administered 2020-07-03: 10 mg via INTRAVENOUS
  Filled 2020-07-03: qty 10

## 2020-07-03 MED ORDER — SODIUM CHLORIDE 0.9% FLUSH
10.0000 mL | INTRAVENOUS | Status: DC | PRN
  Administered 2020-07-03: 10 mL
  Filled 2020-07-03: qty 10

## 2020-07-03 MED ORDER — HEPARIN SOD (PORK) LOCK FLUSH 100 UNIT/ML IV SOLN
500.0000 [IU] | Freq: Once | INTRAVENOUS | Status: AC | PRN
  Administered 2020-07-03: 500 [IU]
  Filled 2020-07-03: qty 5

## 2020-07-03 MED ORDER — DIPHENHYDRAMINE HCL 50 MG/ML IJ SOLN
INTRAMUSCULAR | Status: AC
  Filled 2020-07-03: qty 1

## 2020-07-03 MED ORDER — FAMOTIDINE IN NACL 20-0.9 MG/50ML-% IV SOLN
INTRAVENOUS | Status: AC
  Filled 2020-07-03: qty 50

## 2020-07-03 MED ORDER — SODIUM CHLORIDE 0.9 % IV SOLN
80.0000 mg/m2 | Freq: Once | INTRAVENOUS | Status: AC
  Administered 2020-07-03: 174 mg via INTRAVENOUS
  Filled 2020-07-03: qty 29

## 2020-07-03 MED ORDER — DIPHENHYDRAMINE HCL 50 MG/ML IJ SOLN
25.0000 mg | Freq: Once | INTRAMUSCULAR | Status: AC
Start: 2020-07-03 — End: 2020-07-03
  Administered 2020-07-03: 25 mg via INTRAVENOUS

## 2020-07-03 NOTE — Patient Instructions (Signed)
[facility] MEDICAL ONCOLOGY   Discharge Instructions: Thank you for choosing [facility] to provide your oncology and hematology care.   If you have a lab appointment with the Cancer Center, please go directly to the Cancer Center and check in at the registration area.   Wear comfortable clothing and clothing appropriate for easy access to any Portacath or PICC line.   We strive to give you quality time with your provider. You may need to reschedule your appointment if you arrive late (15 or more minutes).  Arriving late affects you and other patients whose appointments are after yours.  Also, if you miss three or more appointments without notifying the office, you may be dismissed from the clinic at the provider's discretion.      For prescription refill requests, have your pharmacy contact our office and allow 72 hours for refills to be completed.    Today you received the following chemotherapy and/or immunotherapy agents: paclitaxel.      To help prevent nausea and vomiting after your treatment, we encourage you to take your nausea medication as directed.  BELOW ARE SYMPTOMS THAT SHOULD BE REPORTED IMMEDIATELY: *FEVER GREATER THAN 100.4 F (38 C) OR HIGHER *CHILLS OR SWEATING *NAUSEA AND VOMITING THAT IS NOT CONTROLLED WITH YOUR NAUSEA MEDICATION *UNUSUAL SHORTNESS OF BREATH *UNUSUAL BRUISING OR BLEEDING *URINARY PROBLEMS (pain or burning when urinating, or frequent urination) *BOWEL PROBLEMS (unusual diarrhea, constipation, pain near the anus) TENDERNESS IN MOUTH AND THROAT WITH OR WITHOUT PRESENCE OF ULCERS (sore throat, sores in mouth, or a toothache) UNUSUAL RASH, SWELLING OR PAIN  UNUSUAL VAGINAL DISCHARGE OR ITCHING   Items with * indicate a potential emergency and should be followed up as soon as possible or go to the Emergency Department if any problems should occur.  Please show the CHEMOTHERAPY ALERT CARD or IMMUNOTHERAPY ALERT CARD at check-in  to the Emergency Department and triage nurse.  Should you have questions after your visit or need to cancel or reschedule your appointment, please contact [facility] MEDICAL ONCOLOGY  Dept: [phone]  and follow the prompts.  Office hours are 8:00 a.m. to 4:30 p.m. Monday - Friday. Please note that voicemails left after 4:00 p.m. may not be returned until the following business day.  We are closed weekends and major holidays. You have access to a nurse at all times for urgent questions. Please call the main number to the clinic Dept: [phone] and follow the prompts.   For any non-urgent questions, you may also contact your provider using MyChart. We now offer e-Visits for anyone 18 and older to request care online for non-urgent symptoms. For details visit [email or web address].   Also download the MyChart app! Go to the app store, search "MyChart", open the app, select New , and log in with your MyChart username and password.  Due to Covid, a mask is required upon entering the hospital/clinic. If you do not have a mask, one will be given to you upon arrival. For doctor visits, patients may have 1 support person aged 18 or older with them. For treatment visits, patients cannot have anyone with them due to current Covid guidelines and our immunocompromised population.   

## 2020-07-04 LAB — T4: T4, Total: 8.6 ug/dL (ref 4.5–12.0)

## 2020-07-09 ENCOUNTER — Ambulatory Visit
Admission: RE | Admit: 2020-07-09 | Discharge: 2020-07-09 | Disposition: A | Discharge status: Home / Self Care | Payer: BC Managed Care – PPO | Source: Ambulatory Visit | Attending: Oncology | Admitting: Oncology

## 2020-07-09 DIAGNOSIS — C50411 Malignant neoplasm of upper-outer quadrant of right female breast: Secondary | ICD-10-CM

## 2020-07-09 DIAGNOSIS — Z170 Estrogen receptor positive status [ER+]: Secondary | ICD-10-CM

## 2020-07-09 DIAGNOSIS — C7951 Secondary malignant neoplasm of bone: Secondary | ICD-10-CM

## 2020-07-09 MED ORDER — HEPARIN SOD (PORK) LOCK FLUSH 100 UNIT/ML IV SOLN
500.0000 [IU] | Freq: Once | INTRAVENOUS | Status: AC
  Administered 2020-07-09: 500 [IU] via INTRAVENOUS

## 2020-07-09 MED ORDER — IOPAMIDOL (ISOVUE-370) INJECTION 76%
75.0000 mL | Freq: Once | INTRAVENOUS | Status: AC | PRN
  Administered 2020-07-09: 75 mL via INTRAVENOUS

## 2020-07-09 MED ORDER — SODIUM CHLORIDE 0.9% FLUSH
10.0000 mL | INTRAVENOUS | Status: DC | PRN
Start: 2020-07-09 — End: 2020-07-10
  Administered 2020-07-09: 10 mL via INTRAVENOUS

## 2020-07-10 ENCOUNTER — Other Ambulatory Visit: Payer: Self-pay

## 2020-07-10 ENCOUNTER — Encounter: Payer: Self-pay | Admitting: *Deleted

## 2020-07-10 ENCOUNTER — Inpatient Hospital Stay: Payer: BC Managed Care – PPO

## 2020-07-10 VITALS — BP 140/80 | HR 85 | Temp 98.2°F | Resp 18 | Wt 218.0 lb

## 2020-07-10 DIAGNOSIS — C50411 Malignant neoplasm of upper-outer quadrant of right female breast: Secondary | ICD-10-CM | POA: Diagnosis not present

## 2020-07-10 DIAGNOSIS — Z170 Estrogen receptor positive status [ER+]: Secondary | ICD-10-CM

## 2020-07-10 DIAGNOSIS — Z95828 Presence of other vascular implants and grafts: Secondary | ICD-10-CM

## 2020-07-10 LAB — CBC WITH DIFFERENTIAL (CANCER CENTER ONLY)
Abs Immature Granulocytes: 0 10*3/uL (ref 0.00–0.07)
Basophils Absolute: 0 10*3/uL (ref 0.0–0.1)
Basophils Relative: 0 %
Eosinophils Absolute: 0 10*3/uL (ref 0.0–0.5)
Eosinophils Relative: 1 %
HCT: 25.7 % — ABNORMAL LOW (ref 36.0–46.0)
Hemoglobin: 8.3 g/dL — ABNORMAL LOW (ref 12.0–15.0)
Immature Granulocytes: 0 %
Lymphocytes Relative: 51 %
Lymphs Abs: 1.6 10*3/uL (ref 0.7–4.0)
MCH: 31.7 pg (ref 26.0–34.0)
MCHC: 32.3 g/dL (ref 30.0–36.0)
MCV: 98.1 fL (ref 80.0–100.0)
Monocytes Absolute: 0.4 10*3/uL (ref 0.1–1.0)
Monocytes Relative: 12 %
Neutro Abs: 1.1 10*3/uL — ABNORMAL LOW (ref 1.7–7.7)
Neutrophils Relative %: 36 %
Platelet Count: 192 10*3/uL (ref 150–400)
RBC: 2.62 MIL/uL — ABNORMAL LOW (ref 3.87–5.11)
RDW: 18 % — ABNORMAL HIGH (ref 11.5–15.5)
WBC Count: 3.1 10*3/uL — ABNORMAL LOW (ref 4.0–10.5)
nRBC: 0.6 % — ABNORMAL HIGH (ref 0.0–0.2)

## 2020-07-10 LAB — CMP (CANCER CENTER ONLY)
ALT: 17 U/L (ref 0–44)
AST: 18 U/L (ref 15–41)
Albumin: 3.8 g/dL (ref 3.5–5.0)
Alkaline Phosphatase: 107 U/L (ref 38–126)
Anion gap: 7 (ref 5–15)
BUN: 13 mg/dL (ref 6–20)
CO2: 25 mmol/L (ref 22–32)
Calcium: 9.5 mg/dL (ref 8.9–10.3)
Chloride: 108 mmol/L (ref 98–111)
Creatinine: 0.67 mg/dL (ref 0.44–1.00)
GFR, Estimated: >60 mL/min (ref >60)
Glucose, Bld: 116 mg/dL — ABNORMAL HIGH (ref 70–99)
Potassium: 3.9 mmol/L (ref 3.5–5.1)
Sodium: 140 mmol/L (ref 135–145)
Total Bilirubin: <0.2 mg/dL — ABNORMAL LOW (ref 0.3–1.2)
Total Protein: 6.8 g/dL (ref 6.5–8.1)

## 2020-07-10 LAB — TSH: TSH: 2.07 u[IU]/mL (ref 0.308–3.960)

## 2020-07-10 MED ORDER — HEPARIN SOD (PORK) LOCK FLUSH 100 UNIT/ML IV SOLN
500.0000 [IU] | Freq: Once | INTRAVENOUS | Status: AC | PRN
  Administered 2020-07-10: 500 [IU]
  Filled 2020-07-10: qty 5

## 2020-07-10 MED ORDER — SODIUM CHLORIDE 0.9 % IV SOLN
80.0000 mg/m2 | Freq: Once | INTRAVENOUS | Status: AC
  Administered 2020-07-10: 174 mg via INTRAVENOUS
  Filled 2020-07-10: qty 29

## 2020-07-10 MED ORDER — SODIUM CHLORIDE 0.9% FLUSH
10.0000 mL | INTRAVENOUS | Status: DC | PRN
  Administered 2020-07-10: 10 mL
  Filled 2020-07-10: qty 10

## 2020-07-10 MED ORDER — SODIUM CHLORIDE 0.9% FLUSH
10.0000 mL | INTRAVENOUS | Status: DC | PRN
  Administered 2020-07-10: 10 mL via INTRAVENOUS
  Filled 2020-07-10: qty 10

## 2020-07-10 MED ORDER — FAMOTIDINE IN NACL 20-0.9 MG/50ML-% IV SOLN
INTRAVENOUS | Status: AC
  Filled 2020-07-10: qty 50

## 2020-07-10 MED ORDER — SODIUM CHLORIDE 0.9 % IV SOLN
Freq: Once | INTRAVENOUS | Status: AC
Start: 2020-07-10 — End: 2020-07-10
  Filled 2020-07-10: qty 250

## 2020-07-10 MED ORDER — DEXAMETHASONE SODIUM PHOSPHATE 100 MG/10ML IJ SOLN
10.0000 mg | Freq: Once | INTRAMUSCULAR | Status: AC
  Administered 2020-07-10: 10 mg via INTRAVENOUS
  Filled 2020-07-10: qty 10

## 2020-07-10 MED ORDER — DIPHENHYDRAMINE HCL 50 MG/ML IJ SOLN
25.0000 mg | Freq: Once | INTRAMUSCULAR | Status: AC
  Administered 2020-07-10: 25 mg via INTRAVENOUS

## 2020-07-10 MED ORDER — DIPHENHYDRAMINE HCL 50 MG/ML IJ SOLN
INTRAMUSCULAR | Status: AC
  Filled 2020-07-10: qty 1

## 2020-07-10 MED ORDER — FAMOTIDINE IN NACL 20-0.9 MG/50ML-% IV SOLN
20.0000 mg | Freq: Once | INTRAVENOUS | Status: AC
  Administered 2020-07-10: 20 mg via INTRAVENOUS

## 2020-07-10 NOTE — Progress Notes (Signed)
Per MD okay to treat with ANC of 1.1.

## 2020-07-10 NOTE — Patient Instructions (Signed)
[facility] MEDICAL ONCOLOGY  Discharge Instructions: ?Thank you for choosing [facility] to provide your oncology and hematology care.  ? ?If you have a lab appointment with the Cancer Center, please go directly to the Cancer Center and check in at the registration area. ?  ?Wear comfortable clothing and clothing appropriate for easy access to any Portacath or PICC line.  ? ?We strive to give you quality time with your provider. You may need to reschedule your appointment if you arrive late (15 or more minutes).  Arriving late affects you and other patients whose appointments are after yours.  Also, if you miss three or more appointments without notifying the office, you may be dismissed from the clinic at the provider?s discretion.    ?  ?For prescription refill requests, have your pharmacy contact our office and allow 72 hours for refills to be completed.   ? ?Today you received the following chemotherapy and/or immunotherapy agents: Paclitaxel    ?  ?To help prevent nausea and vomiting after your treatment, we encourage you to take your nausea medication as directed. ? ?BELOW ARE SYMPTOMS THAT SHOULD BE REPORTED IMMEDIATELY: ?*FEVER GREATER THAN 100.4 F (38 ?C) OR HIGHER ?*CHILLS OR SWEATING ?*NAUSEA AND VOMITING THAT IS NOT CONTROLLED WITH YOUR NAUSEA MEDICATION ?*UNUSUAL SHORTNESS OF BREATH ?*UNUSUAL BRUISING OR BLEEDING ?*URINARY PROBLEMS (pain or burning when urinating, or frequent urination) ?*BOWEL PROBLEMS (unusual diarrhea, constipation, pain near the anus) ?TENDERNESS IN MOUTH AND THROAT WITH OR WITHOUT PRESENCE OF ULCERS (sore throat, sores in mouth, or a toothache) ?UNUSUAL RASH, SWELLING OR PAIN  ?UNUSUAL VAGINAL DISCHARGE OR ITCHING  ? ?Items with * indicate a potential emergency and should be followed up as soon as possible or go to the Emergency Department if any problems should occur. ? ?Please show the CHEMOTHERAPY ALERT CARD or IMMUNOTHERAPY ALERT CARD at check-in to  the Emergency Department and triage nurse. ? ?Should you have questions after your visit or need to cancel or reschedule your appointment, please contact [facility]ICAL [facility]COLOG[facility]  Dept: [phone]  and follow the prompts.  Office hours are 8:00 a.m. to 4:30 p.m. Monday - Friday. Please note that voicemails left after 4:00 p.m. may not be returned until the following business day.  We are closed weekends and major holidays. You have access to a nurse at all times for urgent questions. Please call the main number to the clinic Dept: [phone] and follow the prompts. ? ? ?For any non-urgent questions, you may also contact your provider using MyChart. We now offer e-Visits for anyone 18 and older to request care online for non-urgent symptoms. For details visit [email or web address]. ?  ?Also download the MyChart app! Go to the app store, search "MyChart", open the app, select , and log in with your MyChart username and password. ? ?Due to Covid, a mask is required upon entering the hospital/clinic. If you do not have a mask, one will be given to you upon arrival. For doctor visits, patients may have 1 support person aged 18 or older with them. For treatment visits, patients cannot have anyone with them due to current Covid guidelines and our immunocompromised population.  ? ?

## 2020-07-10 NOTE — Patient Instructions (Signed)
Implanted Port Insertion, Care After This sheet gives you information about how to care for yourself after your procedure. Your health care provider may also give you more specific instructions. If you have problems or questions, contact your health care provider. What can I expect after the procedure? After the procedure, it is common to have:  Discomfort at the port insertion site.  Bruising on the skin over the port. This should improve over 3-4 days. Follow these instructions at home: Port care  After your port is placed, you will get a manufacturer's information card. The card has information about your port. Keep this card with you at all times.  Take care of the port as told by your health care provider. Ask your health care provider if you or a family member can get training for taking care of the port at home. A home health care nurse may also take care of the port.  Make sure to remember what type of port you have. Incision care  Follow instructions from your health care provider about how to take care of your port insertion site. Make sure you: ? Wash your hands with soap and water before and after you change your bandage (dressing). If soap and water are not available, use hand sanitizer. ? Change your dressing as told by your health care provider. ? Leave stitches (sutures), skin glue, or adhesive strips in place. These skin closures may need to stay in place for 2 weeks or longer. If adhesive strip edges start to loosen and curl up, you may trim the loose edges. Do not remove adhesive strips completely unless your health care provider tells you to do that.  Check your port insertion site every day for signs of infection. Check for: ? Redness, swelling, or pain. ? Fluid or blood. ? Warmth. ? Pus or a bad smell.      Activity  Return to your normal activities as told by your health care provider. Ask your health care provider what activities are safe for you.  Do not  lift anything that is heavier than 10 lb (4.5 kg), or the limit that you are told, until your health care provider says that it is safe. General instructions  Take over-the-counter and prescription medicines only as told by your health care provider.  Do not take baths, swim, or use a hot tub until your health care provider approves. Ask your health care provider if you may take showers. You may only be allowed to take sponge baths.  Do not drive for 24 hours if you were given a sedative during your procedure.  Wear a medical alert bracelet in case of an emergency. This will tell any health care providers that you have a port.  Keep all follow-up visits as told by your health care provider. This is important. Contact a health care provider if:  You cannot flush your port with saline as directed, or you cannot draw blood from the port.  You have a fever or chills.  You have redness, swelling, or pain around your port insertion site.  You have fluid or blood coming from your port insertion site.  Your port insertion site feels warm to the touch.  You have pus or a bad smell coming from the port insertion site. Get help right away if:  You have chest pain or shortness of breath.  You have bleeding from your port that you cannot control. Summary  Take care of the port as told by your   health care provider. Keep the manufacturer's information card with you at all times.  Change your dressing as told by your health care provider.  Contact a health care provider if you have a fever or chills or if you have redness, swelling, or pain around your port insertion site.  Keep all follow-up visits as told by your health care provider. This information is not intended to replace advice given to you by your health care provider. Make sure you discuss any questions you have with your health care provider. Document Revised: 09/26/2017 Document Reviewed: 09/26/2017 Elsevier Patient Education   2021 [employer] Inc[employer]  

## 2020-07-11 ENCOUNTER — Other Ambulatory Visit: Payer: Self-pay

## 2020-07-11 ENCOUNTER — Inpatient Hospital Stay: Payer: BC Managed Care – PPO

## 2020-07-11 VITALS — BP 130/87 | HR 87 | Temp 97.7°F | Resp 18

## 2020-07-11 DIAGNOSIS — C50411 Malignant neoplasm of upper-outer quadrant of right female breast: Secondary | ICD-10-CM | POA: Diagnosis not present

## 2020-07-11 LAB — T4: T4, Total: 7.2 ug/dL (ref 4.5–12.0)

## 2020-07-11 MED ORDER — FILGRASTIM-AAFI 480 MCG/0.8ML IJ SOSY
480.0000 ug | PREFILLED_SYRINGE | Freq: Once | INTRAMUSCULAR | Status: AC
  Administered 2020-07-11: 480 ug via SUBCUTANEOUS

## 2020-07-11 NOTE — Patient Instructions (Signed)

## 2020-07-13 ENCOUNTER — Inpatient Hospital Stay: Payer: BC Managed Care – PPO | Attending: Oncology

## 2020-07-13 ENCOUNTER — Other Ambulatory Visit: Payer: Self-pay

## 2020-07-13 VITALS — BP 125/80 | HR 98 | Temp 98.6°F | Resp 18

## 2020-07-13 DIAGNOSIS — Z87891 Personal history of nicotine dependence: Secondary | ICD-10-CM | POA: Diagnosis not present

## 2020-07-13 DIAGNOSIS — Z79899 Other long term (current) drug therapy: Secondary | ICD-10-CM | POA: Insufficient documentation

## 2020-07-13 DIAGNOSIS — C50411 Malignant neoplasm of upper-outer quadrant of right female breast: Secondary | ICD-10-CM | POA: Diagnosis present

## 2020-07-13 DIAGNOSIS — Z8249 Family history of ischemic heart disease and other diseases of the circulatory system: Secondary | ICD-10-CM | POA: Diagnosis not present

## 2020-07-13 DIAGNOSIS — Z801 Family history of malignant neoplasm of trachea, bronchus and lung: Secondary | ICD-10-CM | POA: Insufficient documentation

## 2020-07-13 DIAGNOSIS — Z833 Family history of diabetes mellitus: Secondary | ICD-10-CM | POA: Diagnosis not present

## 2020-07-13 DIAGNOSIS — Z5112 Encounter for antineoplastic immunotherapy: Secondary | ICD-10-CM | POA: Diagnosis present

## 2020-07-13 DIAGNOSIS — Z8042 Family history of malignant neoplasm of prostate: Secondary | ICD-10-CM | POA: Insufficient documentation

## 2020-07-13 DIAGNOSIS — Z170 Estrogen receptor positive status [ER+]: Secondary | ICD-10-CM | POA: Insufficient documentation

## 2020-07-13 DIAGNOSIS — Z90710 Acquired absence of both cervix and uterus: Secondary | ICD-10-CM | POA: Insufficient documentation

## 2020-07-13 DIAGNOSIS — C7951 Secondary malignant neoplasm of bone: Secondary | ICD-10-CM | POA: Diagnosis present

## 2020-07-13 DIAGNOSIS — I10 Essential (primary) hypertension: Secondary | ICD-10-CM | POA: Insufficient documentation

## 2020-07-13 DIAGNOSIS — Z803 Family history of malignant neoplasm of breast: Secondary | ICD-10-CM | POA: Diagnosis not present

## 2020-07-13 MED ORDER — FILGRASTIM-AAFI 480 MCG/0.8ML IJ SOSY
480.0000 ug | PREFILLED_SYRINGE | Freq: Once | INTRAMUSCULAR | Status: AC
  Administered 2020-07-13: 480 ug via SUBCUTANEOUS
  Filled 2020-07-13: qty 0.8

## 2020-07-13 NOTE — Patient Instructions (Signed)

## 2020-07-14 ENCOUNTER — Inpatient Hospital Stay: Payer: BC Managed Care – PPO

## 2020-07-14 VITALS — BP 125/71 | HR 97 | Resp 18

## 2020-07-14 DIAGNOSIS — Z170 Estrogen receptor positive status [ER+]: Secondary | ICD-10-CM

## 2020-07-14 DIAGNOSIS — C50411 Malignant neoplasm of upper-outer quadrant of right female breast: Secondary | ICD-10-CM

## 2020-07-14 MED ORDER — FILGRASTIM-AAFI 480 MCG/0.8ML IJ SOSY
480.0000 ug | PREFILLED_SYRINGE | Freq: Once | INTRAMUSCULAR | Status: AC
  Administered 2020-07-14: 480 ug via SUBCUTANEOUS
  Filled 2020-07-14: qty 0.8

## 2020-07-14 NOTE — Patient Instructions (Signed)

## 2020-07-16 ENCOUNTER — Encounter: Payer: Self-pay | Admitting: *Deleted

## 2020-07-23 ENCOUNTER — Other Ambulatory Visit: Payer: Self-pay

## 2020-07-23 ENCOUNTER — Inpatient Hospital Stay (HOSPITAL_BASED_OUTPATIENT_CLINIC_OR_DEPARTMENT_OTHER): Payer: BC Managed Care – PPO | Admitting: Oncology

## 2020-07-23 VITALS — BP 137/78 | HR 85 | Temp 97.3°F | Resp 18 | Ht 69.0 in | Wt 215.5 lb

## 2020-07-23 DIAGNOSIS — C7951 Secondary malignant neoplasm of bone: Secondary | ICD-10-CM

## 2020-07-23 DIAGNOSIS — C50411 Malignant neoplasm of upper-outer quadrant of right female breast: Secondary | ICD-10-CM | POA: Diagnosis not present

## 2020-07-23 DIAGNOSIS — Z170 Estrogen receptor positive status [ER+]: Secondary | ICD-10-CM

## 2020-07-23 MED ORDER — ANASTROZOLE 1 MG PO TABS: 1.0000 mg | ORAL_TABLET | Freq: Every day | ORAL | 4 refills | Status: DC

## 2020-07-23 NOTE — Progress Notes (Signed)
[place]  [phone] [phone]     ID: [name] DOB: 1965-11-16  MR#: [id number]  HOZ#:[id number]  Patient Care Team: [name], MD as PCP - General (Family Medicine) Mauro Kaufmann, RN as Oncology Nurse Navigator Rockwell Germany, RN as Oncology Nurse Navigator Rolm Bookbinder, MD as Consulting Physician (General Surgery) Kainan Patty, Virgie Dad, MD as Consulting Physician (Oncology) Kyung Rudd, MD as Consulting Physician (Radiation Oncology) Cheri Fowler, MD as Consulting Physician (Obstetrics and Gynecology) Elsie Saas, MD as Consulting Physician (Orthopedic Surgery) Juanita Craver, MD as Consulting Physician (Gastroenterology) Chauncey Cruel, MD OTHER MD:  CHIEF COMPLAINT: Triple negative breast cancer  CURRENT TREATMENT: pembrolizumab   INTERVAL HISTORY: [name] returns today for follow up and treatment of her 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    She completed 4 cycles of chemotherapy at her last visit on 06/25/2020.  Since her last visit, she underwent breast MRI on 06/30/2020 showing: breast composition B; excellent response to neoadjuvant treatment, residual mass with maximum measurement of 1.4 cm in upper-outer right breast (previously 4.5 cm); mild residual stippled non-mass enhancement contiguous with inferior to mass; extensive non-mass enhancement from subareolar location to pectoralis muscle has otherwise resolved; decrease in size of biopsy-proven metastatic intramammary lymph node in right breast; interval decrease in size of numerous right axillary lymph nodes, 8 residual nodes remain (previously 12); no evidence of malignancy involving left breast.  She also underwent chest CT on 07/09/2020 showing: interval decrease in size of right axillary lymph nodes; no new or progressive findings; no findings for pulmonary or upper abdominal metastatic disease; interval healing changes involving lytic bone lesions  seen on prior study; no new or progressive osseous metastatic disease.   REVIEW OF SYSTEMS: [name] has made a nice recovery from her treatment.  She feels fine.  She is continuing to work full-time.  She does still have some limitations that is what she can do and she will let me know if I need to adjust those in any way.  She says her taste is better also.  Her hair is not yet growing back.  A detailed review of systems today was otherwise stable.Marland Kitchen   COVID 19 VACCINATION STATUS: fully vaccinated Therapist, music), with booster   HISTORY OF CURRENT ILLNESS: From the original intake note:  [name] had routine screening mammography showing a possible abnormality in the right breast and a prominent intramammary lymph node. She underwent right diagnostic mammography with tomography and right breast ultrasonography at [facility] on 03/26/2020 showing: breast density category B; palpable 1.8 cm right breast mass at 10 o'clock, which feels much larger than what is visualized sonographically; suspicious 0.6 cm intramammary lymph node in right breast at 10 o'clock; at least 3 morphologically abnormal lymph nodes in right axilla.  Accordingly on 03/27/2020 she proceeded to biopsy of the right breast areas in question. The pathology from this procedure ([id number]) showed:  1. Right Breast, 10 o'clock (mass)  - invasive mammary carcinoma, e-cadherin positive with a lobular growth pattern, grade 2  - mammary carcinoma in situ  - Prognostic indicators significant for: estrogen receptor, 1% positive and progesterone receptor, 1% positive, both with strong staining intensity. Proliferation marker Ki67 at 30%. HER2 negative by immunohistochemistry (0). 2. Right Breast, 10 o'clock (intramammary node)  - mammary carcinoma involving lymph node and perinodal soft tissue  -  Prognostic indicators significant for: estrogen receptor, 1% positive with strong staining intensity and progesterone receptor, 0%  negative.  Proliferation marker Ki67 at 40%. HER2 negative by immunohistochemistry (0).  Cancer Staging Malignant neoplasm of upper-outer quadrant of right breast in female, estrogen receptor positive ([place]) Staging form: Breast, AJCC 8th Edition - Pathologic stage from 03/31/2020: Stage IV (cM1) - Signed by Gardenia Phlegm, NP on 05/01/2020 - Clinical stage from 04/08/2020: Stage IIB (cT1c, cN1(f), cM0, G2, ER-, PR-, HER2-) - Signed by Chauncey Cruel, MD on 04/08/2020 Stage prefix: Initial diagnosis Method of lymph node assessment: Core biopsy  The patient's subsequent history is as detailed below.   PAST MEDICAL HISTORY: Past Medical History:  Diagnosis Date  . Allergy   . Arthritis 2018   hands, left knee  . Breast cancer ([place]) 03-27-2020   right breast IMC  . Eczema   . Family history of breast cancer   . Family history of lung cancer   . Family history of prostate cancer   . Hypertension     PAST SURGICAL HISTORY: Past Surgical History:  Procedure Laterality Date  . ABDOMINAL HYSTERECTOMY    . CESAREAN SECTION    . KNEE ARTHROSCOPY W/ ACL RECONSTRUCTION Left   . PORTACATH PLACEMENT Left 04/23/2020   Procedure: LEFT SIDE PORT PLACEMENT WITH ULTRASOUND GUIDANCE;  Surgeon: Rolm Bookbinder, MD;  Location: [facility];  Service: General;  Laterality: Left;  230PM START TIME PLEASE ROOM 2 THANKS  . TUBAL LIGATION      FAMILY HISTORY: Family History  Problem Relation Age of Onset  . Breast cancer Mother 49  . Arthritis Mother   . Diabetes Father   . Kidney disease Father   . Hypertension Sister   . Heart disease Maternal Grandmother   . Breast cancer Paternal Grandmother        dx >50  . Heart disease Maternal Aunt   . Breast cancer Maternal Aunt 77  . Other Maternal Aunt        brain tumor (not cancerous)  . Lung cancer Maternal Aunt   . Prostate cancer Cousin 62       localized   Her parents are both living, her father age 31 and mother  age 71, as of 03/2020. Jayanna has 1 brother and 2 sisters. She reports breast cancer in her mother at age 71, her maternal aunt, and her paternal grandmother. Her maternal aunt also has a history of lung cancer. She also reports prostate cancer in a maternal cousin in his 59's.   GYNECOLOGIC HISTORY:  No LMP recorded. Patient has had a hysterectomy. Menarche: 55 years old Age at first live birth: 55 years old [place] P 2 LMP 2010 with hysterectomy Contraceptive: prior use without issue HRT never used  Hysterectomy? Yes, 2010 for fibroids BSO? 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 She is divorced. She lives at home with son [name], age 53, who works for [employer]. Son [place], Brooke Bonito, age 51, works as a Development worker, international aid in [place], [place]. Malaina has no grandchildren. She attends [employer].    ADVANCED DIRECTIVES: not in place; considering naming mother Melody Haver, son [name], or son Raynald as healthcare powers of attorney   HEALTH MAINTENANCE: Social History   Tobacco Use  . Smoking status: Former Smoker    Packs/day: 0.25    Years: 36.00    Pack years: 9.00    Types: Cigarettes    Quit date: 04/22/2017    Years since quitting: 3.2  . Smokeless  tobacco: Never Used  . Tobacco comment: She is trying to quit.    Substance Use Topics  . Alcohol use: Yes    Alcohol/week: 3.0 standard drinks    Types: 3 Glasses of wine per week    Comment: Occasional   . Drug use: Never     Colonoscopy: age 68  PAP: date unsure  Bone density: never done   No Known Allergies  Current Outpatient Medications  Medication Sig Dispense Refill  . acetaminophen (TYLENOL) 500 MG tablet Take 1,000 mg by mouth every 6 (six) hours as needed for moderate pain.    . Boric Acid Vaginal 600 MG SUPP Place 600 mg vaginally daily as needed (yeast infections).    . Cholecalciferol (VITAMIN D) 125 MCG (5000 UT)  CAPS Take 5,000 Units by mouth daily.    . clobetasol ointment (TEMOVATE) 3.84 % Apply 1 application topically 2 (two) times daily as needed (rash/itching).    . Cyanocobalamin (B-12) 5000 MCG CAPS Take 5,000 mcg by mouth daily.    Marland Kitchen lidocaine-prilocaine (EMLA) cream Apply to affected area once (Patient taking differently: Apply 1 application topically daily as needed (port access).) 30 g 3  . prochlorperazine (COMPAZINE) 10 MG tablet Take 1 tablet (10 mg total) by mouth every 6 (six) hours as needed (Nausea or vomiting). 30 tablet 1  . propranolol (INDERAL) 10 MG tablet Take 10 mg by mouth 2 (two) times daily.    . traMADol (ULTRAM) 50 MG tablet Take 2 tablets (100 mg total) by mouth every 6 (six) hours as needed. (Patient not taking: Reported on 06/08/2020) 10 tablet 0   No current facility-administered medications for this visit.    OBJECTIVE: African-American woman who appears stated age  84:   07/23/20 1603  BP: 137/78  Pulse: 85  Resp: 18  Temp: (!) 97.3 F (36.3 C)  SpO2: 100%     Body mass index is 31.82 kg/m.   Wt Readings from Last 3 Encounters:  07/23/20 215 lb 8 oz (97.8 kg)  07/10/20 218 lb (98.9 kg)  07/03/20 215 lb 8 oz (97.8 kg)      ECOG FS:1 - Symptomatic but completely ambulatory  Sclerae unicteric, EOMs intact Wearing a mask No cervical or supraclavicular adenopathy Lungs no rales or rhonchi Heart regular rate and rhythm Abd soft, nontender, positive bowel sounds MSK no focal spinal tenderness, no upper extremity lymphedema Neuro: nonfocal, well oriented, appropriate affect Breasts: I do not palpate a mass in the right breast.  There is still palpable adenopathy in the right axilla.  The left breast and left axilla are benign.   LAB RESULTS:  CMP     Component Value Date/Time   NA 140 07/10/2020 0821   K 3.9 07/10/2020 0821   CL 108 07/10/2020 0821   CO2 25 07/10/2020 0821   GLUCOSE 116 (H) 07/10/2020 0821   BUN 13 07/10/2020 0821    CREATININE 0.67 07/10/2020 0821   CREATININE 0.69 09/24/2013 0819   CALCIUM 9.5 07/10/2020 0821   PROT 6.8 07/10/2020 0821   ALBUMIN 3.8 07/10/2020 0821   AST 18 07/10/2020 0821   ALT 17 07/10/2020 0821   ALKPHOS 107 07/10/2020 0821   BILITOT <0.2 (L) 07/10/2020 0821   GFRNONAA >60 07/10/2020 0821   GFRNONAA >89 09/24/2013 0819   GFRAA >89 09/24/2013 0819    No results found for: TOTALPROTELP, ALBUMINELP, A1GS, A2GS, BETS, BETA2SER, GAMS, MSPIKE, SPEI  Lab Results  Component Value Date   WBC 3.1 (L) 07/10/2020  NEUTROABS 1.1 (L) 07/10/2020   HGB 8.3 (L) 07/10/2020   HCT 25.7 (L) 07/10/2020   MCV 98.1 07/10/2020   PLT 192 07/10/2020    No results found for: LABCA2  No components found for: TMLYYT035  No results for input(s): INR in the last 168 hours.  No results found for: LABCA2  No results found for: WSF681  No results found for: EXN170  No results found for: YFV494  No results found for: CA2729  No components found for: HGQUANT  No results found for: CEA1 / No results found for: CEA1   No results found for: AFPTUMOR  No results found for: CHROMOGRNA  No results found for: KPAFRELGTCHN, LAMBDASER, KAPLAMBRATIO (kappa/lambda light chains)  No results found for: HGBA, HGBA2QUANT, HGBFQUANT, HGBSQUAN (Hemoglobinopathy evaluation)   No results found for: LDH  No results found for: IRON, TIBC, IRONPCTSAT (Iron and TIBC)  No results found for: FERRITIN  Urinalysis No results found for: COLORURINE, APPEARANCEUR, LABSPEC, PHURINE, GLUCOSEU, HGBUR, BILIRUBINUR, KETONESUR, PROTEINUR, UROBILINOGEN, NITRITE, LEUKOCYTESUR   STUDIES: CT Chest W Contrast  Result Date: 07/09/2020 CLINICAL DATA:  Restaging breast cancer. EXAM: CT CHEST WITH CONTRAST TECHNIQUE: Multidetector CT imaging of the chest was performed during intravenous contrast administration. CONTRAST:  52mL ISOVUE-370 IOPAMIDOL (ISOVUE-370) INJECTION 76% COMPARISON:  CT scan 04/20/2020 FINDINGS:  Cardiovascular: The heart is normal in size. No pericardial effusion. Stable mild tortuosity of the thoracic aorta with minimal calcification at the aortic arch. The branch vessels are patent. No obvious coronary artery calcifications. Left-sided Port-A-Cath in good position. Mediastinum/Nodes: No mediastinal or hilar mass or lymphadenopathy. The esophagus is grossly normal. The thyroid gland is unremarkable. Lungs/Pleura: No findings for pulmonary metastatic disease. No acute pulmonary process. Stable benign-appearing fatty lesion the right lung base. Upper Abdomen: No significant upper abdominal findings. No findings suspicious for hepatic or adrenal gland metastasis. No upper abdominal lymphadenopathy. Musculoskeletal: No residual measurable breast mass. Streaky breast density and small surgical clips likely a biopsy site. Persistent but smaller right axillary adenopathy. The largest axillary node on image 48/2 is a short axis diameter of 12 mm and this was previously 16 mm. A slightly more inferior and posterior node measures 10 mm on image 49/2 and previously measured 12 mm. No left breast mass or left axillary adenopathy. No supraclavicular adenopathy. Again demonstrated is diffuse osseous metastatic disease but interval healing of the lytic lesions which are now much more sclerotic. Also, the right ninth rib lesion previously and a large soft tissue component associated with it. This has completely resolved in the lytic bone lesion measures a maximum of 12.5 mm and previously measured 22 mm. No new or progressive metastatic bone disease. IMPRESSION: 1. Interval decrease in size of the right axillary lymph nodes. No new or progressive findings. 2. No findings for pulmonary metastatic disease or upper abdominal metastatic disease. 3. Interval healing changes involving the lytic bone lesions seen on the prior study as detailed above. No new or progressive osseous metastatic disease. 4. Aortic atherosclerosis.  Aortic Atherosclerosis (ICD10-I70.0). Electronically Signed   By: Marijo Sanes M.D.   On: 07/09/2020 15:49   MR BREAST BILATERAL W WO CONTRAST INC CAD  Result Date: 06/30/2020 CLINICAL DATA:  55 year old with a biopsy-proven essentially triple negative invasive mammary carcinoma and mammary carcinoma in situ (E cadherin positive with a lobular growth pattern, ER and PR receptor positivity only 1% and HER 2 Neu negative) who recently completed neoadjuvant chemotherapy. She also has a biopsy-proven metastatic disease to a RIGHT axillary  lymph node. Follow-up MRI is performed to evaluate response to neoadjuvant therapy prior to definitive surgery. LABS:  Not applicable. EXAM: BILATERAL BREAST MRI WITH AND WITHOUT CONTRAST TECHNIQUE: Multiplanar, multisequence MR images of both breasts were obtained prior to and following the intravenous administration of 9 ml of Gadavist. Three-dimensional MR images were rendered by post-processing of the original MR data on an independent workstation. The three-dimensional MR images were interpreted, and findings are reported in the following complete MRI report for this study. Three dimensional images were evaluated at the independent interpreting workstation using the DynaCAD thin client. COMPARISON:  Breast MRI 04/15/2020. Prior mammograms and RIGHT breast ultrasounds, most recently 03/31/2020. FINDINGS: Breast composition: b. Scattered fibroglandular tissue. Background parenchymal enhancement: Mild. Right breast: The mass in the [place] of the RIGHT breast, associated with the ribbon shaped tissue marking clip, has markedly decreased in size since the pretreatment MRI, currently measuring approximately 1.4 x 0.6 x 1.1 cm (AP x transverse x craniocaudal), previously 4.5 x 2.9 x 1.9 cm. There is minimal residual stippled non-mass enhancement contiguous with and inferior to the mass which measures approximately 2.4 x 1.6 x 0.9 cm. The extensive non-mass enhancement  which previously extended into the subareolar location and to the pectoralis muscle has resolved. The intramammary lymph node in the UPPER OUTER QUADRANT which was previously biopsied is barely visible currently, having significantly decreased in size since the prior MRI. It is located approximately 2 cm posterior to the residual mass (and is associated with the coil shaped tissue marker clip). No new abnormal enhancement. Left breast: No suspicious mass or abnormal enhancement. Lymph nodes: The previously identified RIGHT axillary lymph nodes, including retropectoral lymph nodes, have decreased in size since the pretreatment MRI. There were 12 abnormal nodes previously and I count 8 residual abnormal nodes today, including the previously biopsied node which is the most anterior node in the axilla. No new lymphadenopathy. Ancillary findings: Residual enhancing metastasis to the LEFT inferior sternum as noted previously. The patchy enhancement in the LEFT side of the sternal manubrium is less conspicuous currently. IMPRESSION: 1. Excellent imaging response to neoadjuvant chemotherapy. There is a residual mass with a maximum measurement of 1.4 cm at the site of the biopsy-proven malignancy in the UPPER OUTER QUADRANT (previously 4.5 cm). 2. Mild residual stippled non-mass enhancement contiguous with an inferior to the mass, measured above. The extensive non-mass enhancement from the subareolar location to the pectoralis muscle has otherwise resolved. 3. The biopsy-proven metastatic intramammary lymph node in the UPPER OUTER QUADRANT of the RIGHT breast has decreased in size and is barely perceptible. 4. Interval decrease in size of the numerous RIGHT axillary lymph nodes. There are 8 residual pathologic lymph nodes in the RIGHT axilla (previously 12). 5. LEFT inferior sternal metastasis as noted previously. The patchy enhancement in the LEFT sternal manubrium is less conspicuous. 6. No MRI evidence of malignancy  involving the LEFT breast. RECOMMENDATION: Treatment plan. BI-RADS CATEGORY  6: Known biopsy-proven malignancy. Electronically Signed   By: Evangeline Dakin M.D.   On: 06/30/2020 15:25     ELIGIBLE FOR AVAILABLE RESEARCH PROTOCOL: no  ASSESSMENT: 55 y.o. [name] woman status post right breast upper outer quadrant biopsy 03/27/2020 for a clinical T1c N3 M1, stageIV invasive ductal carcinoma, functionally triple negative, with an MIB-1 of 30-40%  (a) chest CT scan 04/20/2020 shows in addition to the breast mass and regional adenopathy, lytic and sclerotic bone changes, possible left thoracic adenopathy, and indeterminant left pleural nodularity  (b)  bone scan 04/21/2020 confirms multiple bone lesions  (c) thoracic MRI confirms diffuse bony metastatic disease but shows no epidural soft tissue component and no cord compression  (1) genetics testing 04/24/2020 through the [facility] RNA[employer] panel found no deleterious mutations in APC, ATM, AXIN2, BARD1, BMPR1A, BRCA1, BRCA2, BRIP1, CDH1, CDK4, CDKN2A, CHEK2, DICER1, EPCAM, GREM1, HOXB13, MEN1, MLH1, MSH2, MSH3, MSH6, MUTYH, NBN, NF1, NF2, NTHL1, PALB2, PMS2, POLD1, POLE, PTEN, RAD51C, RAD51D, RECQL, RET, SDHA, SDHAF2, SDHB, SDHC, SDHD, SMAD4, SMARCA4, STK11, TP53, TSC1, TSC2, and VHL.  RNA data is routinely analyzed for use in variant interpretation for all genes.  (2) neoadjuvant chemotherapy will consist of pembrolizumab every 3 weeks, carboplatin every 3 weeks x 4 with paclitaxel weekly x12, started 04/24/2020, completed 07/10/2020  (a) consider doxorubicin and cyclophosphamide every 3 weeks x 4 at disease progression  (3) zoledronate started 05/08/2018, repeated every 12 weeks  (4) definitive surgery being considered for local control  (5) adjuvant radiation to follow surgery  (6) restaging studies:  (a) post-chemo breast MRI 06/30/2020 shows significant response  (b) CT chest 04/28 shows decreased right axillary  adenopathy, no lung or liver lesions, healing of lytic bone lesions  (7) adjuvant pembrolizumab resumed 07/31/2020, continued to disease progression  (8) anastrozole started 07/26/2020   PLAN: [name] completed her carboplatin and paclitaxel.  She had a very good response though not a complete response to these treatments.  Her case was discussed at the multidisciplinary breast cancer conference 07/15/2020.  It was surgery's opinion at that time that it might be prudent to wait on local treatment until we have a little more time to assess whether the peripheral treatment will be well controlled.  The concern of course is with local recurrence and spread to the chest wall, which can be very difficult to treat.  It is understood there is no survival advantage to mastectomy and axillary lymph node dissection at this point.  The patient will be discussing this further with her surgeon tomorrow  From a systemic point of view we are going to try to extend her response by it #1 continuing the [place] indefinitely and #2 starting anastrozole.  Her breast cancer was only minimally estrogen receptor positive but it was also minimally progesterone receptor positive and does suggest that there might be a slight benefit from adding antiestrogens.  We discussed the possible toxicities side effects and complications of anastrozole and I have gone ahead and placed the order for that.  The Beryle Flock will be resumed 07/31/2020.  We will see her with the next treatment, June 10, to make sure that she is tolerating anastrozole well.  The plan then assuming she does not proceed to surgery now is to restage her with a repeat breast MRI in about 3 months.  Total encounter time 40 minutes.Sarajane Jews C. Aki Burdin, MD 07/23/20 5:30 PM Medical Oncology and Hematology [facility] [place], [place] [address] Tel. [phone]    Fax. [phone]   I, Wilburn Mylar, am acting as scribe for  Dr. Virgie Dad. Maris Bena.  I, Lurline Del MD, have reviewed the above documentation for accuracy and completeness, and I agree with the above.   *Total Encounter Time as defined by the Centers for Medicare and Medicaid Services includes, in addition to the face-to-face time of a patient visit (documented in the note above) non-face-to-face time: obtaining and reviewing outside history, ordering and reviewing medications, tests or procedures, care coordination (communications with other health care professionals  or caregivers) and documentation in the medical record.

## 2020-07-24 ENCOUNTER — Other Ambulatory Visit: Payer: Self-pay | Admitting: Pharmacist

## 2020-07-27 ENCOUNTER — Other Ambulatory Visit: Payer: Self-pay | Admitting: Pharmacist

## 2020-07-27 ENCOUNTER — Other Ambulatory Visit: Payer: Self-pay | Admitting: *Deleted

## 2020-07-27 MED ORDER — ANASTROZOLE 1 MG PO TABS: 1.0000 mg | ORAL_TABLET | Freq: Every day | ORAL | 4 refills | Status: DC

## 2020-07-29 ENCOUNTER — Telehealth: Payer: Self-pay | Admitting: Oncology

## 2020-07-29 NOTE — Telephone Encounter (Signed)
Scheduled per 5/12 los. Called and spoke with pt confirmed 5/23 appts

## 2020-08-03 ENCOUNTER — Other Ambulatory Visit: Payer: Self-pay

## 2020-08-03 ENCOUNTER — Other Ambulatory Visit: Payer: Self-pay | Admitting: Oncology

## 2020-08-03 ENCOUNTER — Inpatient Hospital Stay: Payer: BC Managed Care – PPO

## 2020-08-03 VITALS — BP 138/80 | HR 81 | Temp 98.2°F | Resp 18 | Wt 214.8 lb

## 2020-08-03 DIAGNOSIS — Z170 Estrogen receptor positive status [ER+]: Secondary | ICD-10-CM

## 2020-08-03 DIAGNOSIS — C7951 Secondary malignant neoplasm of bone: Secondary | ICD-10-CM

## 2020-08-03 DIAGNOSIS — C50411 Malignant neoplasm of upper-outer quadrant of right female breast: Secondary | ICD-10-CM

## 2020-08-03 DIAGNOSIS — Z95828 Presence of other vascular implants and grafts: Secondary | ICD-10-CM

## 2020-08-03 LAB — CMP (CANCER CENTER ONLY)
ALT: 11 U/L (ref 0–44)
AST: 17 U/L (ref 15–41)
Albumin: 3.7 g/dL (ref 3.5–5.0)
Alkaline Phosphatase: 107 U/L (ref 38–126)
Anion gap: 8 (ref 5–15)
BUN: 18 mg/dL (ref 6–20)
CO2: 26 mmol/L (ref 22–32)
Calcium: 10 mg/dL (ref 8.9–10.3)
Chloride: 107 mmol/L (ref 98–111)
Creatinine: 0.76 mg/dL (ref 0.44–1.00)
GFR, Estimated: >60 mL/min (ref >60)
Glucose, Bld: 113 mg/dL — ABNORMAL HIGH (ref 70–99)
Potassium: 3.8 mmol/L (ref 3.5–5.1)
Sodium: 141 mmol/L (ref 135–145)
Total Bilirubin: <0.2 mg/dL — ABNORMAL LOW (ref 0.3–1.2)
Total Protein: 7.2 g/dL (ref 6.5–8.1)

## 2020-08-03 LAB — CBC WITH DIFFERENTIAL (CANCER CENTER ONLY)
Abs Immature Granulocytes: 0.02 10*3/uL (ref 0.00–0.07)
Basophils Absolute: 0 10*3/uL (ref 0.0–0.1)
Basophils Relative: 0 %
Eosinophils Absolute: 0.1 10*3/uL (ref 0.0–0.5)
Eosinophils Relative: 2 %
HCT: 34.4 % — ABNORMAL LOW (ref 36.0–46.0)
Hemoglobin: 11 g/dL — ABNORMAL LOW (ref 12.0–15.0)
Immature Granulocytes: 0 %
Lymphocytes Relative: 40 %
Lymphs Abs: 2.1 10*3/uL (ref 0.7–4.0)
MCH: 31.5 pg (ref 26.0–34.0)
MCHC: 32 g/dL (ref 30.0–36.0)
MCV: 98.6 fL (ref 80.0–100.0)
Monocytes Absolute: 0.7 10*3/uL (ref 0.1–1.0)
Monocytes Relative: 13 %
Neutro Abs: 2.3 10*3/uL (ref 1.7–7.7)
Neutrophils Relative %: 45 %
Platelet Count: 315 10*3/uL (ref 150–400)
RBC: 3.49 MIL/uL — ABNORMAL LOW (ref 3.87–5.11)
RDW: 15.1 % (ref 11.5–15.5)
WBC Count: 5.2 10*3/uL (ref 4.0–10.5)
nRBC: 0 % (ref 0.0–0.2)

## 2020-08-03 LAB — TSH: TSH: 2.185 u[IU]/mL (ref 0.308–3.960)

## 2020-08-03 MED ORDER — SODIUM CHLORIDE 0.9 % IV SOLN
Freq: Once | INTRAVENOUS | Status: AC
Start: 2020-08-03 — End: 2020-08-03
  Filled 2020-08-03: qty 250

## 2020-08-03 MED ORDER — SODIUM CHLORIDE 0.9% FLUSH
10.0000 mL | INTRAVENOUS | Status: DC | PRN
  Administered 2020-08-03: 10 mL
  Filled 2020-08-03: qty 10

## 2020-08-03 MED ORDER — SODIUM CHLORIDE 0.9% FLUSH
10.0000 mL | INTRAVENOUS | Status: DC | PRN
  Administered 2020-08-03: 10 mL via INTRAVENOUS
  Filled 2020-08-03: qty 10

## 2020-08-03 MED ORDER — HEPARIN SOD (PORK) LOCK FLUSH 100 UNIT/ML IV SOLN
500.0000 [IU] | Freq: Once | INTRAVENOUS | Status: AC | PRN
  Administered 2020-08-03: 500 [IU]
  Filled 2020-08-03: qty 5

## 2020-08-03 MED ORDER — ZOLEDRONIC ACID 4 MG/100ML IV SOLN
4.0000 mg | Freq: Once | INTRAVENOUS | Status: AC
  Administered 2020-08-03: 4 mg via INTRAVENOUS

## 2020-08-03 MED ORDER — SODIUM CHLORIDE 0.9 % IV SOLN
200.0000 mg | Freq: Once | INTRAVENOUS | Status: AC
  Administered 2020-08-03: 200 mg via INTRAVENOUS
  Filled 2020-08-03: qty 8

## 2020-08-03 MED ORDER — ZOLEDRONIC ACID 4 MG/100ML IV SOLN
INTRAVENOUS | Status: AC
  Filled 2020-08-03: qty 100

## 2020-08-03 NOTE — Patient Instructions (Addendum)
[facility]  Discharge Instructions: Thank you for choosing [facility] to provide your oncology and hematology care.   If you have a lab appointment with the [facility], please go directly to the [facility] and check in at the registration area.   Wear comfortable clothing and clothing appropriate for easy access to any Portacath or PICC line.   We strive to give you quality time with your provider. You may need to reschedule your appointment if you arrive late (15 or more minutes).  Arriving late affects you and other patients whose appointments are after yours.  Also, if you miss three or more appointments without notifying the office, you may be dismissed from the clinic at the provider's discretion.      For prescription refill requests, have your pharmacy contact our office and allow 72 hours for refills to be completed.    Today you received the following chemotherapy and/or immunotherapy agents: Keytruda.      To help prevent nausea and vomiting after your treatment, we encourage you to take your nausea medication as directed.  BELOW ARE SYMPTOMS THAT SHOULD BE REPORTED IMMEDIATELY: . *FEVER GREATER THAN 100.4 F (38 C) OR HIGHER . *CHILLS OR SWEATING . *NAUSEA AND VOMITING THAT IS NOT CONTROLLED WITH YOUR NAUSEA MEDICATION . *UNUSUAL SHORTNESS OF BREATH . *UNUSUAL BRUISING OR BLEEDING . *URINARY PROBLEMS (pain or burning when urinating, or frequent urination) . *BOWEL PROBLEMS (unusual diarrhea, constipation, pain near the anus) . TENDERNESS IN MOUTH AND THROAT WITH OR WITHOUT PRESENCE OF ULCERS (sore throat, sores in mouth, or a toothache) . UNUSUAL RASH, SWELLING OR PAIN  . UNUSUAL VAGINAL DISCHARGE OR ITCHING   Items with * indicate a potential emergency and should be followed up as soon as possible or go to the Emergency Department if any problems should occur.  Please show the CHEMOTHERAPY ALERT CARD or IMMUNOTHERAPY ALERT  CARD at check-in to the Emergency Department and triage nurse.  Should you have questions after your visit or need to cancel or reschedule your appointment, please contact [facility]  Dept: [phone]  and follow the prompts.  Office hours are 8:00 a.m. to 4:30 p.m. Monday - Friday. Please note that voicemails left after 4:00 p.m. may not be returned until the following business day.  We are closed weekends and major holidays. You have access to a nurse at all times for urgent questions. Please call the main number to the clinic Dept: [phone] and follow the prompts.   For any non-urgent questions, you may also contact your provider using MyChart. We now offer e-Visits for anyone 23 and older to request care online for non-urgent symptoms. For details visit [email or web address].   Also download the MyChart app! Go to the app store, search "MyChart", open the app, select [facility], and log in with your MyChart username and password.  Due to Covid, a mask is required upon entering the hospital/clinic. If you do not have a mask, one will be given to you upon arrival. For doctor visits, patients may have 1 support person aged 60 or older with them. For treatment visits, patients cannot have anyone with them due to current Covid guidelines and our immunocompromised population.   Zoledronic Acid Injection (Hypercalcemia, Oncology) What is this medicine? ZOLEDRONIC ACID (ZOE le dron ik AS id) slows calcium loss from bones. It high calcium levels in the blood from some kinds of cancer. It may be used in  other people at risk for bone loss. This medicine may be used for other purposes; ask your health care provider or pharmacist if you have questions. COMMON BRAND NAME(S): Zometa What should I tell my health care provider before I take this medicine? They need to know if you have any of these conditions:  cancer  dehydration  dental disease  kidney  disease  liver disease  low levels of calcium in the blood  lung or breathing disease (asthma)  receiving steroids like dexamethasone or prednisone  an unusual or allergic reaction to zoledronic acid, other medicines, foods, dyes, or preservatives  pregnant or trying to get pregnant  breast-feeding How should I use this medicine? This drug is injected into a vein. It is given by a health care provider in a hospital or clinic setting. Talk to your health care provider about the use of this drug in children. Special care may be needed. Overdosage: If you think you have taken too much of this medicine contact a poison control center or emergency room at once. NOTE: This medicine is only for you. Do not share this medicine with others. What if I miss a dose? Keep appointments for follow-up doses. It is important not to miss your dose. Call your health care provider if you are unable to keep an appointment. What may interact with this medicine?  certain antibiotics given by injection  NSAIDs, medicines for pain and inflammation, like ibuprofen or naproxen  some diuretics like bumetanide, furosemide  teriparatide  thalidomide This list may not describe all possible interactions. Give your health care provider a list of all the medicines, herbs, non-prescription drugs, or dietary supplements you use. Also tell them if you smoke, drink alcohol, or use illegal drugs. Some items may interact with your medicine. What should I watch for while using this medicine? Visit your health care provider for regular checks on your progress. It may be some time before you see the benefit from this drug. Some people who take this drug have severe bone, joint, or muscle pain. This drug may also increase your risk for jaw problems or a broken thigh bone. Tell your health care provider right away if you have severe pain in your jaw, bones, joints, or muscles. Tell you health care provider if you have any  pain that does not go away or that gets worse. Tell your dentist and dental surgeon that you are taking this drug. You should not have major dental surgery while on this drug. See your dentist to have a dental exam and fix any dental problems before starting this drug. Take good care of your teeth while on this drug. Make sure you see your dentist for regular follow-up appointments. You should make sure you get enough calcium and vitamin D while you are taking this drug. Discuss the foods you eat and the vitamins you take with your health care provider. Check with your health care provider if you have severe diarrhea, nausea, and vomiting, or if you sweat a lot. The loss of too much body fluid may make it dangerous for you to take this drug. You may need blood work done while you are taking this drug. Do not become pregnant while taking this drug. Women should inform their health care provider if they wish to become pregnant or think they might be pregnant. There is potential for serious harm to an unborn child. Talk to your health care provider for more information. What side effects may I notice from  receiving this medicine? Side effects that you should report to your doctor or health care provider as soon as possible:  allergic reactions (skin rash, itching or hives; swelling of the face, lips, or tongue)  bone pain  infection (fever, chills, cough, sore throat, pain or trouble passing urine)  jaw pain, especially after dental work  joint pain  kidney injury (trouble passing urine or change in the amount of urine)  low blood pressure (dizziness; feeling faint or lightheaded, falls; unusually weak or tired)  low calcium levels (fast heartbeat; muscle cramps or pain; pain, tingling, or numbness in the hands or feet; seizures)  low magnesium levels (fast, irregular heartbeat; muscle cramp or pain; muscle weakness; tremors; seizures)  low red blood cell counts (trouble breathing; feeling  faint; lightheaded, falls; unusually weak or tired)  muscle pain  redness, blistering, peeling, or loosening of the skin, including inside the mouth  severe diarrhea  swelling of the ankles, feet, hands  trouble breathing Side effects that usually do not require medical attention (report to your doctor or health care provider if they continue or are bothersome):  anxious  constipation  coughing  depressed mood  eye irritation, itching, or pain  fever  general ill feeling or flu-like symptoms  nausea  pain, redness, or irritation at site where injected  trouble sleeping This list may not describe all possible side effects. Call your doctor for medical advice about side effects. You may report side effects to FDA at 1-800-FDA-1088. Where should I keep my medicine? This drug is given in a hospital or clinic. It will not be stored at home. NOTE: This sheet is a summary. It may not cover all possible information. If you have questions about this medicine, talk to your doctor, pharmacist, or health care provider.  2021 Elsevier/Gold Standard (2018-12-13 09:13:00)

## 2020-08-04 LAB — T4: T4, Total: 7.4 ug/dL (ref 4.5–12.0)

## 2020-08-21 ENCOUNTER — Inpatient Hospital Stay: Payer: BC Managed Care – PPO

## 2020-08-21 ENCOUNTER — Inpatient Hospital Stay: Payer: BC Managed Care – PPO | Attending: Oncology

## 2020-08-21 ENCOUNTER — Other Ambulatory Visit: Payer: Self-pay

## 2020-08-21 ENCOUNTER — Encounter: Payer: Self-pay | Admitting: Adult Health

## 2020-08-21 ENCOUNTER — Inpatient Hospital Stay (HOSPITAL_BASED_OUTPATIENT_CLINIC_OR_DEPARTMENT_OTHER): Payer: BC Managed Care – PPO | Admitting: Adult Health

## 2020-08-21 VITALS — BP 125/78 | HR 78 | Temp 98.3°F | Resp 17

## 2020-08-21 DIAGNOSIS — C50411 Malignant neoplasm of upper-outer quadrant of right female breast: Secondary | ICD-10-CM

## 2020-08-21 DIAGNOSIS — Z170 Estrogen receptor positive status [ER+]: Secondary | ICD-10-CM

## 2020-08-21 DIAGNOSIS — I10 Essential (primary) hypertension: Secondary | ICD-10-CM | POA: Diagnosis not present

## 2020-08-21 DIAGNOSIS — Z79811 Long term (current) use of aromatase inhibitors: Secondary | ICD-10-CM | POA: Insufficient documentation

## 2020-08-21 DIAGNOSIS — Z95828 Presence of other vascular implants and grafts: Secondary | ICD-10-CM

## 2020-08-21 DIAGNOSIS — Z79899 Other long term (current) drug therapy: Secondary | ICD-10-CM | POA: Diagnosis not present

## 2020-08-21 DIAGNOSIS — Z171 Estrogen receptor negative status [ER-]: Secondary | ICD-10-CM | POA: Diagnosis not present

## 2020-08-21 DIAGNOSIS — Z5112 Encounter for antineoplastic immunotherapy: Secondary | ICD-10-CM | POA: Diagnosis present

## 2020-08-21 DIAGNOSIS — C7951 Secondary malignant neoplasm of bone: Secondary | ICD-10-CM | POA: Diagnosis present

## 2020-08-21 LAB — CBC WITH DIFFERENTIAL (CANCER CENTER ONLY)
Abs Immature Granulocytes: 0.02 10*3/uL (ref 0.00–0.07)
Basophils Absolute: 0 10*3/uL (ref 0.0–0.1)
Basophils Relative: 0 %
Eosinophils Absolute: 0.2 10*3/uL (ref 0.0–0.5)
Eosinophils Relative: 3 %
HCT: 32.4 % — ABNORMAL LOW (ref 36.0–46.0)
Hemoglobin: 10.7 g/dL — ABNORMAL LOW (ref 12.0–15.0)
Immature Granulocytes: 0 %
Lymphocytes Relative: 37 %
Lymphs Abs: 2.2 10*3/uL (ref 0.7–4.0)
MCH: 31.6 pg (ref 26.0–34.0)
MCHC: 33 g/dL (ref 30.0–36.0)
MCV: 95.6 fL (ref 80.0–100.0)
Monocytes Absolute: 0.8 10*3/uL (ref 0.1–1.0)
Monocytes Relative: 14 %
Neutro Abs: 2.7 10*3/uL (ref 1.7–7.7)
Neutrophils Relative %: 46 %
Platelet Count: 247 10*3/uL (ref 150–400)
RBC: 3.39 MIL/uL — ABNORMAL LOW (ref 3.87–5.11)
RDW: 12.8 % (ref 11.5–15.5)
WBC Count: 5.9 10*3/uL (ref 4.0–10.5)
nRBC: 0 % (ref 0.0–0.2)

## 2020-08-21 LAB — CMP (CANCER CENTER ONLY)
ALT: 16 U/L (ref 0–44)
AST: 17 U/L (ref 15–41)
Albumin: 3.9 g/dL (ref 3.5–5.0)
Alkaline Phosphatase: 106 U/L (ref 38–126)
Anion gap: 8 (ref 5–15)
BUN: 19 mg/dL (ref 6–20)
CO2: 27 mmol/L (ref 22–32)
Calcium: 10.3 mg/dL (ref 8.9–10.3)
Chloride: 104 mmol/L (ref 98–111)
Creatinine: 0.97 mg/dL (ref 0.44–1.00)
GFR, Estimated: >60 mL/min (ref >60)
Glucose, Bld: 99 mg/dL (ref 70–99)
Potassium: 4.3 mmol/L (ref 3.5–5.1)
Sodium: 139 mmol/L (ref 135–145)
Total Bilirubin: <0.2 mg/dL — ABNORMAL LOW (ref 0.3–1.2)
Total Protein: 7.5 g/dL (ref 6.5–8.1)

## 2020-08-21 LAB — TSH: TSH: 1.408 u[IU]/mL (ref 0.308–3.960)

## 2020-08-21 MED ORDER — HEPARIN SOD (PORK) LOCK FLUSH 100 UNIT/ML IV SOLN
500.0000 [IU] | Freq: Once | INTRAVENOUS | Status: AC | PRN
  Administered 2020-08-21: 500 [IU]
  Filled 2020-08-21: qty 5

## 2020-08-21 MED ORDER — SODIUM CHLORIDE 0.9% FLUSH
10.0000 mL | INTRAVENOUS | Status: DC | PRN
  Administered 2020-08-21: 10 mL
  Filled 2020-08-21: qty 10

## 2020-08-21 MED ORDER — SODIUM CHLORIDE 0.9 % IV SOLN
Freq: Once | INTRAVENOUS | Status: AC
  Filled 2020-08-21: qty 250

## 2020-08-21 MED ORDER — SODIUM CHLORIDE 0.9% FLUSH
10.0000 mL | INTRAVENOUS | Status: DC | PRN
  Administered 2020-08-21: 10 mL via INTRAVENOUS
  Filled 2020-08-21: qty 10

## 2020-08-21 MED ORDER — SODIUM CHLORIDE 0.9 % IV SOLN
200.0000 mg | Freq: Once | INTRAVENOUS | Status: AC
  Administered 2020-08-21: 200 mg via INTRAVENOUS
  Filled 2020-08-21: qty 8

## 2020-08-21 NOTE — Progress Notes (Signed)
[facility]  [phone] [phone]     ID: [name] DOB: [birth year]  MR#: [id number]  KKX#:[id number]  Patient Care Team: [name], MD as PCP - General (Family Medicine) Mauro Kaufmann, RN as Oncology Nurse Navigator Rockwell Germany, RN as Oncology Nurse Navigator Rolm Bookbinder, MD as Consulting Physician (General Surgery) Magrinat, Virgie Dad, MD as Consulting Physician (Oncology) Kyung Rudd, MD as Consulting Physician (Radiation Oncology) Cheri Fowler, MD as Consulting Physician (Obstetrics and Gynecology) Elsie Saas, MD as Consulting Physician (Orthopedic Surgery) Juanita Craver, MD as Consulting Physician (Gastroenterology) Scot Dock, NP OTHER MD:  CHIEF COMPLAINT: Triple negative breast cancer  CURRENT TREATMENT: pembrolizumab   INTERVAL HISTORY: Ethleen returns today for follow up and treatment of her 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    She completed 4 cycles of chemotherapy at her last visit on 06/25/2020.  At breast conference, it was decided ofr the patient to forego surgery and see if optimal control can be obtained with systemic treatment since there is no survival benefit to surgery.    She will continue on Pembrolizumab every 3 weeks and was started on Anastrozole daily. She is tolerating this well and is without difficulty.  Zoraya would like to return to work.  She says that she is physically capable of meeting all of the job requirements listen in her paperwork.  She was previously not to work overtime.  She completed chemotherapy on 06/25/2020 and is continuing to regain her strength and her fatigue is slowly decreasing.     REVIEW OF SYSTEMS: Review of Systems  Constitutional:  Negative for appetite change, chills, fatigue, fever and unexpected weight change.  HENT:   Negative for hearing loss, lump/mass and trouble swallowing.   Eyes:  Negative for eye problems and icterus.  Respiratory:   Negative for chest tightness, cough and shortness of breath.   Cardiovascular:  Negative for chest pain, leg swelling and palpitations.  Gastrointestinal:  Negative for abdominal distention, abdominal pain, constipation, diarrhea, nausea and vomiting.  Endocrine: Negative for hot flashes.  Genitourinary:  Negative for difficulty urinating.   Musculoskeletal:  Negative for arthralgias.  Skin:  Negative for itching and rash.  Neurological:  Negative for dizziness, extremity weakness, headaches and numbness.  Hematological:  Negative for adenopathy. Does not bruise/bleed easily.  Psychiatric/Behavioral:  Negative for depression. The patient is not nervous/anxious.       COVID 19 VACCINATION STATUS: fully vaccinated Therapist, music), with booster   HISTORY OF CURRENT ILLNESS: From the original intake note:  [name] had routine screening mammography showing a possible abnormality in the right breast and a prominent intramammary lymph node. She underwent right diagnostic mammography with tomography and right breast ultrasonography at [facility] on 03/26/2020 showing: breast density category B; palpable 1.8 cm right breast mass at 10 o'clock, which feels much larger than what is visualized sonographically; suspicious 0.6 cm intramammary lymph node in right breast at 10 o'clock; at least 3 morphologically abnormal lymph nodes in right axilla.  Accordingly on 03/27/2020 she proceeded to biopsy of the right breast areas in question. The pathology from this procedure ([id number]) showed:  1. Right Breast, 10 o'clock (mass)  - invasive mammary carcinoma, e-cadherin positive with a lobular growth pattern, grade 2  - mammary carcinoma in situ  - Prognostic indicators significant for: estrogen receptor, 1% positive and progesterone receptor, 1% positive, both with strong staining intensity. Proliferation marker Ki67 at 30%. HER2 negative by immunohistochemistry (0).  2. Right Breast, 10 o'clock  (intramammary node)  - mammary carcinoma involving lymph node and perinodal soft tissue  -  Prognostic indicators significant for: estrogen receptor, 1% positive with strong staining intensity and progesterone receptor, 0% negative. Proliferation marker Ki67 at 40%. HER2 negative by immunohistochemistry (0).  Cancer Staging Malignant neoplasm of upper-outer quadrant of right breast in female, estrogen receptor positive ([facility]) Staging form: Breast, AJCC 8th Edition - Pathologic stage from 03/31/2020: Stage IV (cM1) - Signed by Gardenia Phlegm, NP on 05/01/2020 - Clinical stage from 04/08/2020: Stage IIB (cT1c, cN1(f), cM0, G2, ER-, PR-, HER2-) - Signed by Chauncey Cruel, MD on 04/08/2020 Stage prefix: Initial diagnosis Method of lymph node assessment: Core biopsy  The patient's subsequent history is as detailed below.   PAST MEDICAL HISTORY: Past Medical History:  Diagnosis Date   Allergy    Arthritis 2018   hands, left knee   Breast cancer ([facility]) 03-27-2020   right breast IMC   Eczema    Family history of breast cancer    Family history of lung cancer    Family history of prostate cancer    Hypertension     PAST SURGICAL HISTORY: Past Surgical History:  Procedure Laterality Date   ABDOMINAL HYSTERECTOMY     CESAREAN SECTION     KNEE ARTHROSCOPY W/ ACL RECONSTRUCTION Left    PORTACATH PLACEMENT Left 04/23/2020   Procedure: LEFT SIDE PORT PLACEMENT WITH ULTRASOUND GUIDANCE;  Surgeon: Rolm Bookbinder, MD;  Location: [facility];  Service: General;  Laterality: Left;  230PM START TIME PLEASE ROOM 2 THANKS   TUBAL LIGATION      FAMILY HISTORY: Family History  Problem Relation Age of Onset   Breast cancer Mother 29   Arthritis Mother    Diabetes Father    Kidney disease Father    Hypertension Sister    Heart disease Maternal Grandmother    Breast cancer Paternal Grandmother        dx >50   Heart disease Maternal Aunt    Breast cancer Maternal Aunt  77   Other Maternal Aunt        brain tumor (not cancerous)   Lung cancer Maternal Aunt    Prostate cancer Cousin 58       localized   Her parents are both living, her father age 47 and mother age 33, as of 03/2020. [name] has 1 brother and 2 sisters. She reports breast cancer in her mother at age 63, her maternal aunt, and her paternal grandmother. Her maternal aunt also has a history of lung cancer. She also reports prostate cancer in a maternal cousin in his 70's.   GYNECOLOGIC HISTORY:  No LMP recorded. Patient has had a hysterectomy. Menarche: 55 years old Age at first live birth: 55 years old [place] P 2 LMP 2010 with hysterectomy Contraceptive: prior use without issue HRT never used  Hysterectomy? Yes, 2010 for fibroids BSO? 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 She is divorced. She lives at home with son [name], age 33, who works for [employer]. Son [place], Brooke Bonito, age 22, works as a Development worker, international aid in [place], [place]. Kanesha has no grandchildren. She attends [employer].    ADVANCED DIRECTIVES: not in place; considering naming mother Melody Haver, son [name], or son Raynald as healthcare powers of attorney   HEALTH MAINTENANCE: Social History   Tobacco Use   Smoking status:  Former    Packs/day: 0.25    Years: 36.00    Pack years: 9.00    Types: Cigarettes    Quit date: 04/22/2017    Years since quitting: 3.3   Smokeless tobacco: Never   Tobacco comments:    She is trying to quit.    Substance Use Topics   Alcohol use: Yes    Alcohol/week: 3.0 standard drinks    Types: 3 Glasses of wine per week    Comment: Occasional    Drug use: Never     Colonoscopy: age 89  PAP: date unsure  Bone density: never done   No Known Allergies  Current Outpatient Medications  Medication Sig Dispense Refill   acetaminophen (TYLENOL) 500 MG tablet Take 1,000 mg by mouth every 6 (six)  hours as needed for moderate pain.     anastrozole (ARIMIDEX) 1 MG tablet Take 1 tablet (1 mg total) by mouth daily. 90 tablet 4   Boric Acid Vaginal 600 MG SUPP Place 600 mg vaginally daily as needed (yeast infections).     Cholecalciferol (VITAMIN D) 125 MCG (5000 UT) CAPS Take 5,000 Units by mouth daily.     clobetasol ointment (TEMOVATE) 9.89 % Apply 1 application topically 2 (two) times daily as needed (rash/itching).     Cyanocobalamin (B-12) 5000 MCG CAPS Take 5,000 mcg by mouth daily.     lidocaine-prilocaine (EMLA) cream Apply to affected area once (Patient taking differently: Apply 1 application topically daily as needed (port access).) 30 g 3   prochlorperazine (COMPAZINE) 10 MG tablet Take 1 tablet (10 mg total) by mouth every 6 (six) hours as needed (Nausea or vomiting). 30 tablet 1   propranolol (INDERAL) 10 MG tablet Take 10 mg by mouth 2 (two) times daily.     spironolactone (ALDACTONE) 25 MG tablet Take 1 tablet by mouth 2 (two) times daily.     No current facility-administered medications for this visit.    OBJECTIVE: African-American woman who appears stated age  There were no vitals filed for this visit.    There is no height or weight on file to calculate BMI.   Wt Readings from Last 3 Encounters:  08/03/20 214 lb 12 oz (97.4 kg)  07/23/20 215 lb 8 oz (97.8 kg)  07/10/20 218 lb (98.9 kg)      ECOG FS:1 - Symptomatic but completely ambulatory  GENERAL: Patient is a well appearing female in no acute distress HEENT:  Sclerae anicteric.  Oropharynx clear and moist. No ulcerations or evidence of oropharyngeal candidiasis. Neck is supple.  NODES and breasts: unable to palpate any abnormality LUNGS:  Clear to auscultation bilaterally.  No wheezes or rhonchi. HEART:  Regular rate and rhythm. No murmur appreciated. ABDOMEN:  Soft, nontender.  Positive, normoactive bowel sounds. No organomegaly palpated. MSK:  No focal spinal tenderness to palpation. Full range of motion  bilaterally in the upper extremities. EXTREMITIES:  No peripheral edema.   SKIN:  Clear with no obvious rashes or skin changes. No nail dyscrasia. NEURO:  Nonfocal. Well oriented.  Appropriate affect.   LAB RESULTS:  CMP     Component Value Date/Time   NA 141 08/03/2020 1342   K 3.8 08/03/2020 1342   CL 107 08/03/2020 1342   CO2 26 08/03/2020 1342   GLUCOSE 113 (H) 08/03/2020 1342   BUN 18 08/03/2020 1342   CREATININE 0.76 08/03/2020 1342   CREATININE 0.69 09/24/2013 0819   CALCIUM 10.0 08/03/2020 1342   PROT 7.2 08/03/2020 1342  ALBUMIN 3.7 08/03/2020 1342   AST 17 08/03/2020 1342   ALT 11 08/03/2020 1342   ALKPHOS 107 08/03/2020 1342   BILITOT <0.2 (L) 08/03/2020 1342   GFRNONAA >60 08/03/2020 1342   GFRNONAA >89 09/24/2013 0819   GFRAA >89 09/24/2013 0819    No results found for: TOTALPROTELP, ALBUMINELP, A1GS, A2GS, BETS, BETA2SER, GAMS, MSPIKE, SPEI  Lab Results  Component Value Date   WBC 5.9 08/21/2020   NEUTROABS 2.7 08/21/2020   HGB 10.7 (L) 08/21/2020   HCT 32.4 (L) 08/21/2020   MCV 95.6 08/21/2020   PLT 247 08/21/2020    No results found for: LABCA2  No components found for: FBPZWC585  No results for input(s): INR in the last 168 hours.  No results found for: LABCA2  No results found for: IDP824  No results found for: MPN361  No results found for: WER154  No results found for: CA2729  No components found for: HGQUANT  No results found for: CEA1 / No results found for: CEA1   No results found for: AFPTUMOR  No results found for: CHROMOGRNA  No results found for: KPAFRELGTCHN, LAMBDASER, KAPLAMBRATIO (kappa/lambda light chains)  No results found for: HGBA, HGBA2QUANT, HGBFQUANT, HGBSQUAN (Hemoglobinopathy evaluation)   No results found for: LDH  No results found for: IRON, TIBC, IRONPCTSAT (Iron and TIBC)  No results found for: FERRITIN  Urinalysis No results found for: COLORURINE, APPEARANCEUR, LABSPEC, PHURINE, GLUCOSEU,  HGBUR, BILIRUBINUR, KETONESUR, PROTEINUR, UROBILINOGEN, NITRITE, LEUKOCYTESUR   STUDIES: No results found.    ELIGIBLE FOR AVAILABLE RESEARCH PROTOCOL: no  ASSESSMENT: 55 y.o. [place] woman status post right breast upper outer quadrant biopsy 03/27/2020 for a clinical T1c N3 M1, stageIV invasive ductal carcinoma, functionally triple negative, with an MIB-1 of 30-40%  (a) chest CT scan 04/20/2020 shows in addition to the breast mass and regional adenopathy, lytic and sclerotic bone changes, possible left thoracic adenopathy, and indeterminant left pleural nodularity  (b) bone scan 04/21/2020 confirms multiple bone lesions  (c) thoracic MRI confirms diffuse bony metastatic disease but shows no epidural soft tissue component and no cord compression  (1) genetics testing 04/24/2020 through the [facility] panel found no deleterious mutations in APC, ATM, AXIN2, BARD1, BMPR1A, BRCA1, BRCA2, BRIP1, CDH1, CDK4, CDKN2A, CHEK2, DICER1, EPCAM, GREM1, HOXB13, MEN1, MLH1, MSH2, MSH3, MSH6, MUTYH, NBN, NF1, NF2, NTHL1, PALB2, PMS2, POLD1, POLE, PTEN, RAD51C, RAD51D, RECQL, RET, SDHA, SDHAF2, SDHB, SDHC, SDHD, SMAD4, SMARCA4, STK11, TP53, TSC1, TSC2, and VHL.  RNA data is routinely analyzed for use in variant interpretation for all genes.  (2) neoadjuvant chemotherapy will consist of pembrolizumab every 3 weeks, carboplatin every 3 weeks x 4 with paclitaxel weekly x12, started 04/24/2020, completed 07/10/2020  (a) consider doxorubicin and cyclophosphamide every 3 weeks x 4 at disease progression  (3) zoledronate started 05/08/2018, repeated every 12 weeks  (4) definitive surgery being considered for local control  (5) adjuvant radiation to follow surgery  (6) restaging studies:  (a) post-chemo breast MRI 06/30/2020 shows significant response  (b) CT chest 04/28 shows decreased right axillary adenopathy, no lung or liver lesions, healing of lytic bone lesions  (7) adjuvant  pembrolizumab resumed 07/31/2020, continued to disease progression  (8) anastrozole started 07/26/2020   PLAN: Kenedee is doing well today.  She has no clinical indication of breast cancer progression.  She will continue on treatment with Pembrolizumab and Anastrozole which she is tolerating well.  The MRI breast for f/u of her cancer is orderd for 10/2020.  [employer]  will continue with healthy diet and exercise.  She feels she is ready to return to her previous position so I sent the work forms to [facility] to complete.  She will return in 3 weeks for labs and treatment and in 6 weeks for labs, f/u and treatment.  She knows to call for any questions that may arise between now and her next appointment.  We are happy to see her sooner if needed.   Total encounter time 20 minutes.* in chart review, face to face visit time, and documentation of the encounter.  Wilber Bihari, NP 08/21/20 11:46 AM Medical Oncology and Hematology [facility] [place], [place] [address] Tel. [phone]    Fax. [phone]    *Total Encounter Time as defined by the Centers for Medicare and Medicaid Services includes, in addition to the face-to-face time of a patient visit (documented in the note above) non-face-to-face time: obtaining and reviewing outside history, ordering and reviewing medications, tests or procedures, care coordination (communications with other health care professionals or caregivers) and documentation in the medical record.

## 2020-08-21 NOTE — Patient Instructions (Signed)
[facility]  Discharge Instructions: ?Thank you for choosing [facility] to provide your oncology and hematology care.  ? ?If you have a lab appointment with the Cancer Center, please go directly to the Cancer Center and check in at the registration area. ?  ?Wear comfortable clothing and clothing appropriate for easy access to any Portacath or PICC line.  ? ?We strive to give you quality time with your provider. You may need to reschedule your appointment if you arrive late (15 or more minutes).  Arriving late affects you and other patients whose appointments are after yours.  Also, if you miss three or more appointments without notifying the office, you may be dismissed from the clinic at the provider?s discretion.    ?  ?For prescription refill requests, have your pharmacy contact our office and allow 72 hours for refills to be completed.   ? ?Today you received the following chemotherapy and/or immunotherapy agents: Keytruda ?  ?To help prevent nausea and vomiting after your treatment, we encourage you to take your nausea medication as directed. ? ?BELOW ARE SYMPTOMS THAT SHOULD BE REPORTED IMMEDIATELY: ?*FEVER GREATER THAN 100.4 F (38 ?C) OR HIGHER ?*CHILLS OR SWEATING ?*NAUSEA AND VOMITING THAT IS NOT CONTROLLED WITH YOUR NAUSEA MEDICATION ?*UNUSUAL SHORTNESS OF BREATH ?*UNUSUAL BRUISING OR BLEEDING ?*URINARY PROBLEMS (pain or burning when urinating, or frequent urination) ?*BOWEL PROBLEMS (unusual diarrhea, constipation, pain near the anus) ?TENDERNESS IN MOUTH AND THROAT WITH OR WITHOUT PRESENCE OF ULCERS (sore throat, sores in mouth, or a toothache) ?UNUSUAL RASH, SWELLING OR PAIN  ?UNUSUAL VAGINAL DISCHARGE OR ITCHING  ? ?Items with * indicate a potential emergency and should be followed up as soon as possible or go to the Emergency Department if any problems should occur. ? ?Please show the CHEMOTHERAPY ALERT CARD or IMMUNOTHERAPY ALERT CARD at check-in to the  Emergency Department and triage nurse. ? ?Should you have questions after your visit or need to cancel or reschedule your appointment, please contact [facility]  Dept: [phone]  and follow the prompts.  Office hours are 8:00 a.m. to 4:30 p.m. Monday - Friday. Please note that voicemails left after 4:00 p.m. may not be returned until the following business day.  We are closed weekends and major holidays. You have access to a nurse at all times for urgent questions. Please call the main number to the clinic Dept: [phone] and follow the prompts. ? ? ?For any non-urgent questions, you may also contact your provider using MyChart. We now offer e-Visits for anyone 18 and older to request care online for non-urgent symptoms. For details visit [email or web address]. ?  ?Also download the MyChart app! Go to the app store, search "MyChart", open the app, select [facility], and log in with your MyChart username and password. ? ?Due to Covid, a mask is required upon entering the hospital/clinic. If you do not have a mask, one will be given to you upon arrival. For doctor visits, patients may have 1 support person aged 18 or older with them. For treatment visits, patients cannot have anyone with them due to current Covid guidelines and our immunocompromised population.  ? ?

## 2020-08-22 LAB — T4: T4, Total: 7.7 ug/dL (ref 4.5–12.0)

## 2020-09-11 ENCOUNTER — Other Ambulatory Visit: Payer: Self-pay

## 2020-09-11 ENCOUNTER — Inpatient Hospital Stay: Payer: BC Managed Care – PPO

## 2020-09-11 ENCOUNTER — Inpatient Hospital Stay: Payer: BC Managed Care – PPO | Attending: Oncology

## 2020-09-11 VITALS — BP 133/77 | HR 77 | Temp 98.0°F | Resp 17

## 2020-09-11 DIAGNOSIS — C7951 Secondary malignant neoplasm of bone: Secondary | ICD-10-CM | POA: Insufficient documentation

## 2020-09-11 DIAGNOSIS — Z170 Estrogen receptor positive status [ER+]: Secondary | ICD-10-CM

## 2020-09-11 DIAGNOSIS — Z87891 Personal history of nicotine dependence: Secondary | ICD-10-CM | POA: Insufficient documentation

## 2020-09-11 DIAGNOSIS — C50411 Malignant neoplasm of upper-outer quadrant of right female breast: Secondary | ICD-10-CM | POA: Insufficient documentation

## 2020-09-11 DIAGNOSIS — Z171 Estrogen receptor negative status [ER-]: Secondary | ICD-10-CM | POA: Diagnosis not present

## 2020-09-11 DIAGNOSIS — Z5112 Encounter for antineoplastic immunotherapy: Secondary | ICD-10-CM | POA: Diagnosis present

## 2020-09-11 DIAGNOSIS — Z79899 Other long term (current) drug therapy: Secondary | ICD-10-CM | POA: Diagnosis not present

## 2020-09-11 DIAGNOSIS — Z95828 Presence of other vascular implants and grafts: Secondary | ICD-10-CM

## 2020-09-11 LAB — CBC WITH DIFFERENTIAL (CANCER CENTER ONLY)
Abs Immature Granulocytes: 0.01 10*3/uL (ref 0.00–0.07)
Basophils Absolute: 0 10*3/uL (ref 0.0–0.1)
Basophils Relative: 0 %
Eosinophils Absolute: 0.1 10*3/uL (ref 0.0–0.5)
Eosinophils Relative: 2 %
HCT: 33.9 % — ABNORMAL LOW (ref 36.0–46.0)
Hemoglobin: 11.1 g/dL — ABNORMAL LOW (ref 12.0–15.0)
Immature Granulocytes: 0 %
Lymphocytes Relative: 33 %
Lymphs Abs: 2.1 10*3/uL (ref 0.7–4.0)
MCH: 31.4 pg (ref 26.0–34.0)
MCHC: 32.7 g/dL (ref 30.0–36.0)
MCV: 96 fL (ref 80.0–100.0)
Monocytes Absolute: 0.8 10*3/uL (ref 0.1–1.0)
Monocytes Relative: 12 %
Neutro Abs: 3.4 10*3/uL (ref 1.7–7.7)
Neutrophils Relative %: 53 %
Platelet Count: 209 10*3/uL (ref 150–400)
RBC: 3.53 MIL/uL — ABNORMAL LOW (ref 3.87–5.11)
RDW: 11.7 % (ref 11.5–15.5)
WBC Count: 6.4 10*3/uL (ref 4.0–10.5)
nRBC: 0 % (ref 0.0–0.2)

## 2020-09-11 LAB — TSH: TSH: 1.083 u[IU]/mL (ref 0.308–3.960)

## 2020-09-11 LAB — CMP (CANCER CENTER ONLY)
ALT: 14 U/L (ref 0–44)
AST: 18 U/L (ref 15–41)
Albumin: 3.8 g/dL (ref 3.5–5.0)
Alkaline Phosphatase: 95 U/L (ref 38–126)
Anion gap: 8 (ref 5–15)
BUN: 12 mg/dL (ref 6–20)
CO2: 25 mmol/L (ref 22–32)
Calcium: 9.8 mg/dL (ref 8.9–10.3)
Chloride: 108 mmol/L (ref 98–111)
Creatinine: 0.81 mg/dL (ref 0.44–1.00)
GFR, Estimated: >60 mL/min (ref >60)
Glucose, Bld: 122 mg/dL — ABNORMAL HIGH (ref 70–99)
Potassium: 3.9 mmol/L (ref 3.5–5.1)
Sodium: 141 mmol/L (ref 135–145)
Total Bilirubin: <0.2 mg/dL — ABNORMAL LOW (ref 0.3–1.2)
Total Protein: 7.5 g/dL (ref 6.5–8.1)

## 2020-09-11 MED ORDER — SODIUM CHLORIDE 0.9% FLUSH
10.0000 mL | INTRAVENOUS | Status: DC | PRN
Start: 2020-09-11 — End: 2020-09-11
  Administered 2020-09-11: 10 mL via INTRAVENOUS
  Filled 2020-09-11: qty 10

## 2020-09-11 MED ORDER — SODIUM CHLORIDE 0.9% FLUSH
10.0000 mL | INTRAVENOUS | Status: DC | PRN
  Administered 2020-09-11: 10 mL
  Filled 2020-09-11: qty 10

## 2020-09-11 MED ORDER — HEPARIN SOD (PORK) LOCK FLUSH 100 UNIT/ML IV SOLN
500.0000 [IU] | Freq: Once | INTRAVENOUS | Status: DC
  Filled 2020-09-11: qty 5

## 2020-09-11 MED ORDER — SODIUM CHLORIDE 0.9 % IV SOLN
200.0000 mg | Freq: Once | INTRAVENOUS | Status: AC
  Administered 2020-09-11: 200 mg via INTRAVENOUS
  Filled 2020-09-11: qty 8

## 2020-09-11 MED ORDER — SODIUM CHLORIDE 0.9 % IV SOLN
Freq: Once | INTRAVENOUS | Status: AC
Start: 2020-09-11 — End: 2020-09-11
  Filled 2020-09-11: qty 250

## 2020-09-11 MED ORDER — HEPARIN SOD (PORK) LOCK FLUSH 100 UNIT/ML IV SOLN
500.0000 [IU] | Freq: Once | INTRAVENOUS | Status: AC | PRN
  Administered 2020-09-11: 500 [IU]
  Filled 2020-09-11: qty 5

## 2020-09-11 NOTE — Patient Instructions (Signed)

## 2020-09-24 NOTE — Progress Notes (Signed)
[facility]  [phone] [phone]) [phone]     ID: ASTRID VIDES DOB: 1965/04/19  MR#: [id number]  SJG#:[id number]  Patient Care Team: [name], MD as PCP - General (Family Medicine) Mauro Kaufmann, RN as Oncology Nurse Navigator Rockwell Germany, RN as Oncology Nurse Navigator Rolm Bookbinder, MD as Consulting Physician (General Surgery) Magrinat, Virgie Dad, MD as Consulting Physician (Oncology) Kyung Rudd, MD as Consulting Physician (Radiation Oncology) Cheri Fowler, MD as Consulting Physician (Obstetrics and Gynecology) Elsie Saas, MD as Consulting Physician (Orthopedic Surgery) Juanita Craver, MD as Consulting Physician (Gastroenterology) Scot Dock, NP OTHER MD:  CHIEF COMPLAINT: Triple negative breast cancer  CURRENT TREATMENT: pembrolizumab   INTERVAL HISTORY: Jonathan returns today for follow up and treatment of her 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 She has continued on Pembrolizumab every 3 weeks and Anastrozole daily  for her breast cancer.  She tolerates both of these treatments well.    Detrice underwent a repeat breast MRI on 09/28/2020.  This showed an increase in the breast cancer in the right breast upper outer quadrant, and in the axillary adenopathy.  Hazel says that she already looked at he results online and says that she feels the axillary adenopathy is increased when she feels it.    Ahonesty is otherwise feeling well.  She is hoping to get her treatments adjusted and is going out of town next week for a couple of weeks to visit family she hasn't seen in a couple of years in the [place] denies any new issues, and other than feeling the increase in size of the right axillary adenopathy, she denies any pain, tiredness, constipation, significant hot flashes, vaginal dryness.     REVIEW OF SYSTEMS: Review of Systems  Constitutional:  Negative for appetite change, chills, fatigue, fever and unexpected  weight change.  HENT:   Negative for hearing loss, lump/mass and trouble swallowing.   Eyes:  Negative for eye problems and icterus.  Respiratory:  Negative for chest tightness, cough and shortness of breath.   Cardiovascular:  Negative for chest pain, leg swelling and palpitations.  Gastrointestinal:  Negative for abdominal distention, abdominal pain, constipation, diarrhea, nausea and vomiting.  Endocrine: Negative for hot flashes.  Genitourinary:  Negative for difficulty urinating.   Musculoskeletal:  Negative for arthralgias.  Skin:  Negative for itching and rash.  Neurological:  Negative for dizziness, extremity weakness, headaches and numbness.  Hematological:  Negative for adenopathy. Does not bruise/bleed easily.  Psychiatric/Behavioral:  Negative for depression. The patient is not nervous/anxious.       COVID 19 VACCINATION STATUS: fully vaccinated Therapist, music), with booster   HISTORY OF CURRENT ILLNESS: From the original intake note:  AYLLA HUFFINE had routine screening mammography showing a possible abnormality in the right breast and a prominent intramammary lymph node. She underwent right diagnostic mammography with tomography and right breast ultrasonography at [facility] on 03/26/2020 showing: breast density category B; palpable 1.8 cm right breast mass at 10 o'clock, which feels much larger than what is visualized sonographically; suspicious 0.6 cm intramammary lymph node in right breast at 10 o'clock; at least 3 morphologically abnormal lymph nodes in right axilla.  Accordingly on 03/27/2020 she proceeded to biopsy of the right breast areas in question. The pathology from this procedure ([id number]) showed:  1. Right Breast, 10 o'clock (mass)  - invasive mammary carcinoma, e-cadherin positive with a lobular growth pattern, grade 2  - mammary carcinoma in situ  -  Prognostic indicators significant for: estrogen receptor, 1% positive and progesterone receptor, 1%  positive, both with strong staining intensity. Proliferation marker Ki67 at 30%. HER2 negative by immunohistochemistry (0). 2. Right Breast, 10 o'clock (intramammary node)  - mammary carcinoma involving lymph node and perinodal soft tissue  -  Prognostic indicators significant for: estrogen receptor, 1% positive with strong staining intensity and progesterone receptor, 0% negative. Proliferation marker Ki67 at 40%. HER2 negative by immunohistochemistry (0).  Cancer Staging Malignant neoplasm of upper-outer quadrant of right breast in female, estrogen receptor positive ([place]) Staging form: Breast, AJCC 8th Edition - Pathologic stage from 03/31/2020: Stage IV (cM1) - Signed by Gardenia Phlegm, NP on 05/01/2020 - Clinical stage from 04/08/2020: Stage IIB (cT1c, cN1(f), cM0, G2, ER-, PR-, HER2-) - Signed by Chauncey Cruel, MD on 04/08/2020 Stage prefix: Initial diagnosis Method of lymph node assessment: Core biopsy  The patient's subsequent history is as detailed below.   PAST MEDICAL HISTORY: Past Medical History:  Diagnosis Date   Allergy    Arthritis 2018   hands, left knee   Breast cancer ([place]) 03-27-2020   right breast IMC   Eczema    Family history of breast cancer    Family history of lung cancer    Family history of prostate cancer    Hypertension     PAST SURGICAL HISTORY: Past Surgical History:  Procedure Laterality Date   ABDOMINAL HYSTERECTOMY     CESAREAN SECTION     KNEE ARTHROSCOPY W/ ACL RECONSTRUCTION Left    PORTACATH PLACEMENT Left 04/23/2020   Procedure: LEFT SIDE PORT PLACEMENT WITH ULTRASOUND GUIDANCE;  Surgeon: Rolm Bookbinder, MD;  Location: [facility];  Service: General;  Laterality: Left;  230PM START TIME PLEASE ROOM 2 THANKS   TUBAL LIGATION      FAMILY HISTORY: Family History  Problem Relation Age of Onset   Breast cancer Mother 87   Arthritis Mother    Diabetes Father    Kidney disease Father    Hypertension Sister     Heart disease Maternal Grandmother    Breast cancer Paternal Grandmother        dx >50   Heart disease Maternal Aunt    Breast cancer Maternal Aunt 77   Other Maternal Aunt        brain tumor (not cancerous)   Lung cancer Maternal Aunt    Prostate cancer Cousin 20       localized   Her parents are both living, her father age 41 and mother age 44, as of 03/2020. Lanee has 1 brother and 2 sisters. She reports breast cancer in her mother at age 69, her maternal aunt, and her paternal grandmother. Her maternal aunt also has a history of lung cancer. She also reports prostate cancer in a maternal cousin in his 25's.   GYNECOLOGIC HISTORY:  No LMP recorded. Patient has had a hysterectomy. Menarche: 55 years old Age at first live birth: 55 years old [place] P 2 LMP 2010 with hysterectomy Contraceptive: prior use without issue HRT never used  Hysterectomy? Yes, 2010 for fibroids BSO? 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 She is divorced. She lives at home with son [name], age 55, who works for [employer]. Son [place], Brooke Bonito, age 50, works as a Development worker, international aid in [place], [place]. Rosell has no grandchildren. She attends [employer].    ADVANCED DIRECTIVES: not in place; considering  naming mother Melody Haver, son [name], or son Raynald as healthcare powers of attorney   HEALTH MAINTENANCE: Social History   Tobacco Use   Smoking status: Former    Packs/day: 0.25    Years: 36.00    Pack years: 9.00    Types: Cigarettes    Quit date: 04/22/2017    Years since quitting: 3.4   Smokeless tobacco: Never   Tobacco comments:    She is trying to quit.    Substance Use Topics   Alcohol use: Yes    Alcohol/week: 3.0 standard drinks    Types: 3 Glasses of wine per week    Comment: Occasional    Drug use: Never     Colonoscopy: age 49  PAP: date unsure  Bone density: never done   No Known  Allergies  Current Outpatient Medications  Medication Sig Dispense Refill   acetaminophen (TYLENOL) 500 MG tablet Take 1,000 mg by mouth every 6 (six) hours as needed for moderate pain.     anastrozole (ARIMIDEX) 1 MG tablet Take 1 tablet (1 mg total) by mouth daily. 90 tablet 4   Boric Acid Vaginal 600 MG SUPP Place 600 mg vaginally daily as needed (yeast infections).     Cholecalciferol (VITAMIN D) 125 MCG (5000 UT) CAPS Take 5,000 Units by mouth daily.     clobetasol ointment (TEMOVATE) 3.15 % Apply 1 application topically 2 (two) times daily as needed (rash/itching).     Cyanocobalamin (B-12) 5000 MCG CAPS Take 5,000 mcg by mouth daily.     lidocaine-prilocaine (EMLA) cream Apply to affected area once (Patient taking differently: Apply 1 application topically daily as needed (port access).) 30 g 3   prochlorperazine (COMPAZINE) 10 MG tablet Take 1 tablet (10 mg total) by mouth every 6 (six) hours as needed (Nausea or vomiting). 30 tablet 1   propranolol (INDERAL) 10 MG tablet Take 10 mg by mouth 2 (two) times daily.     spironolactone (ALDACTONE) 25 MG tablet Take 1 tablet by mouth 2 (two) times daily.     No current facility-administered medications for this visit.    OBJECTIVE: African-American woman who appears stated age  87:   10/02/20 1124  BP: 126/78  Pulse: 88  Resp: 18  Temp: 97.9 F (36.6 C)  SpO2: 100%      Body mass index is 31.79 kg/m.   Wt Readings from Last 3 Encounters:  10/02/20 215 lb 4.8 oz (97.7 kg)  08/03/20 214 lb 12 oz (97.4 kg)  07/23/20 215 lb 8 oz (97.8 kg)      ECOG FS:1 - Symptomatic but completely ambulatory  GENERAL: Patient is a well appearing female in no acute distress HEENT:  Sclerae anicteric.  Oropharynx clear and moist. No ulcerations or evidence of oropharyngeal candidiasis. Neck is supple.  NODES and breasts: Right axilla with + lymphadenopathy, right breast is full, but no distinct palpable nodules LUNGS:  Clear to auscultation  bilaterally.  No wheezes or rhonchi. HEART:  Regular rate and rhythm. No murmur appreciated. ABDOMEN:  Soft, nontender.  Positive, normoactive bowel sounds. No organomegaly palpated. MSK:  No focal spinal tenderness to palpation. Full range of motion bilaterally in the upper extremities. EXTREMITIES:  No peripheral edema.   SKIN:  Clear with no obvious rashes or skin changes. No nail dyscrasia. NEURO:  Nonfocal. Well oriented.  Appropriate affect.   LAB RESULTS:  CMP     Component Value Date/Time   NA 136 10/02/2020 1055   K 3.9  10/02/2020 1055   CL 102 10/02/2020 1055   CO2 27 10/02/2020 1055   GLUCOSE 118 (H) 10/02/2020 1055   BUN 15 10/02/2020 1055   CREATININE 0.75 10/02/2020 1055   CREATININE 0.69 09/24/2013 0819   CALCIUM 10.1 10/02/2020 1055   PROT 7.9 10/02/2020 1055   ALBUMIN 3.8 10/02/2020 1055   AST 20 10/02/2020 1055   ALT 16 10/02/2020 1055   ALKPHOS 74 10/02/2020 1055   BILITOT 0.4 10/02/2020 1055   GFRNONAA >60 10/02/2020 1055   GFRNONAA >89 09/24/2013 0819   GFRAA >89 09/24/2013 0819    No results found for: TOTALPROTELP, ALBUMINELP, A1GS, A2GS, BETS, BETA2SER, GAMS, MSPIKE, SPEI  Lab Results  Component Value Date   WBC 6.9 10/02/2020   NEUTROABS 3.5 10/02/2020   HGB 10.5 (L) 10/02/2020   HCT 31.8 (L) 10/02/2020   MCV 91.9 10/02/2020   PLT 252 10/02/2020    No results found for: LABCA2  No components found for: LFYBOF751  No results for input(s): INR in the last 168 hours.  No results found for: LABCA2  No results found for: WCH852  No results found for: DPO242  No results found for: PNT614  No results found for: CA2729  No components found for: HGQUANT  No results found for: CEA1 / No results found for: CEA1   No results found for: AFPTUMOR  No results found for: CHROMOGRNA  No results found for: KPAFRELGTCHN, LAMBDASER, KAPLAMBRATIO (kappa/lambda light chains)  No results found for: HGBA, HGBA2QUANT, HGBFQUANT,  HGBSQUAN (Hemoglobinopathy evaluation)   No results found for: LDH  No results found for: IRON, TIBC, IRONPCTSAT (Iron and TIBC)  No results found for: FERRITIN  Urinalysis No results found for: COLORURINE, APPEARANCEUR, LABSPEC, PHURINE, GLUCOSEU, HGBUR, BILIRUBINUR, KETONESUR, PROTEINUR, UROBILINOGEN, NITRITE, LEUKOCYTESUR   STUDIES: MR BREAST BILATERAL W WO CONTRAST INC CAD  Result Date: 09/28/2020 CLINICAL DATA:  55 year old with biopsy proven right breast cancer and metastatic axillary adenopathy. Status post chemotherapy which ended on 07/10/2020. LABS:  None obtained at the time of imaging. EXAM: BILATERAL BREAST MRI WITH AND WITHOUT CONTRAST TECHNIQUE: Multiplanar, multisequence MR images of both breasts were obtained prior to and following the intravenous administration of 10 ml of Gadavist Three-dimensional MR images were rendered by post-processing of the original MR data on an independent workstation. The three-dimensional MR images were interpreted, and findings are reported in the following complete MRI report for this study. Three dimensional images were evaluated at the independent interpreting workstation using the DynaCAD thin client. COMPARISON:  Previous exam(s) including prior MRIs dated 06/30/2020 and 04/15/2020. FINDINGS: Breast composition: b. Scattered fibroglandular tissue. Background parenchymal enhancement: Moderate. Right breast: The mass in the upper-outer quadrant of the right breast associated with a ribbon shaped clip measures 1.9 x 0.6 x 1.1 cm (series 11, image 56). Previously it measured 1.4 x 0.6 x 1.1 cm on the 06/30/2020 study. There is non masslike patchy enhancement anteriorly extending towards the nipple and posteriorly measuring 6.1 x 4.3 cm in anterior-posterior and transverse dimensions (series 11, images 64 and 59) . It is more prominent than the previous exam dated 06/30/2020. There is a second signal void artifact in the upper-outer quadrant of the  breast where the intramammary lymph node was biopsied. There is a 1 cm enhancing mass in this area (series 8, image 54). Left breast: No mass or abnormal enhancement. Lymph nodes: The right axillary adenopathy including the retropectoral lymph nodes have increased in size compared to the prior exam. The lymph node  that was previously biopsied now measures 3.1 cm (series 8, image 33) compared to 2.0 cm on the 06/30/2020 study. There are at least 8 enlarged lymph nodes. Ancillary findings: 7 mm enhancing mass in the inferior sternum on the left is unchanged (series 8, image 101). IMPRESSION: Interval prominence of the mass in the upper-outer quadrant of the right breast as well as the non masslike patchy enhancement extending anteriorly and posteriorly. Interval increase in the size of the right axillary adenopathy. No interval change in the enhancing mass in the inferior sternum on the left. RECOMMENDATION: Continued treatment planning is recommended. BI-RADS CATEGORY  6: Known biopsy-proven malignancy. Electronically Signed   By: Lillia Mountain M.D.   On: 09/28/2020 11:52      ELIGIBLE FOR AVAILABLE RESEARCH PROTOCOL: no  ASSESSMENT: 55 y.o. [place] woman status post right breast upper outer quadrant biopsy 03/27/2020 for a clinical T1c N3 M1, stageIV invasive ductal carcinoma, functionally triple negative, with an MIB-1 of 30-40%  (a) chest CT scan 04/20/2020 shows in addition to the breast mass and regional adenopathy, lytic and sclerotic bone changes, possible left thoracic adenopathy, and indeterminant left pleural nodularity  (b) bone scan 04/21/2020 confirms multiple bone lesions  (c) thoracic MRI confirms diffuse bony metastatic disease but shows no epidural soft tissue component and no cord compression  (1) genetics testing 04/24/2020 through the [facility] RNA[facility] panel found no deleterious mutations in APC, ATM, AXIN2, BARD1, BMPR1A, BRCA1, BRCA2, BRIP1, CDH1, CDK4, CDKN2A,  CHEK2, DICER1, EPCAM, GREM1, HOXB13, MEN1, MLH1, MSH2, MSH3, MSH6, MUTYH, NBN, NF1, NF2, NTHL1, PALB2, PMS2, POLD1, POLE, PTEN, RAD51C, RAD51D, RECQL, RET, SDHA, SDHAF2, SDHB, SDHC, SDHD, SMAD4, SMARCA4, STK11, TP53, TSC1, TSC2, and VHL.  RNA data is routinely analyzed for use in variant interpretation for all genes.  (2) neoadjuvant chemotherapy will consist of pembrolizumab every 3 weeks, carboplatin every 3 weeks x 4 with paclitaxel weekly x12, started 04/24/2020, completed 07/10/2020  (a) consider doxorubicin and cyclophosphamide every 3 weeks x 4 at disease progression  (3) zoledronate started 05/08/2018, repeated every 12 weeks  (4) definitive surgery being considered for local control  (5) adjuvant radiation to follow surgery  (6) restaging studies:  (a) post-chemo breast MRI 06/30/2020 shows significant response  (b) CT chest 04/28 shows decreased right axillary adenopathy, no lung or liver lesions, healing of lytic bone lesions  (7) adjuvant pembrolizumab resumed 07/31/2020, continued to disease progression  (8) anastrozole started 07/26/2020   PLAN: Erian is here today for follow up of her metastatic breast cancer.  She has continued on Pembrolizumab and Anastrozole.  Her breast MRI completed on 09/28/2020 shows progression in her breast cancer.    I reviewed with Janyce that Dr. [name] is out of town this week, and will return on Monday.  I recommended that she proceed with Pembrolizumab today, and I will have him reach out to her upon his return.  Kaylanie is agreeable with this plan, and knows he will call her in a few days.  I sent Dr. [name] a message and that message included Taygen's upcoming travel plans.    Lelaina will call in the interim if she develops any other questions or concerns.    Total encounter time 20 minutes.* in chart review, face to face visit time, and documentation of the encounter.  Wilber Bihari, NP 10/04/20 2:04 PM Medical Oncology and  Hematology [facility] [place], [place] [address] Tel. [phone]    Fax. [phone]    *  Total Encounter Time as defined by the Centers for Medicare and Medicaid Services includes, in addition to the face-to-face time of a patient visit (documented in the note above) non-face-to-face time: obtaining and reviewing outside history, ordering and reviewing medications, tests or procedures, care coordination (communications with other health care professionals or caregivers) and documentation in the medical record.

## 2020-09-25 ENCOUNTER — Ambulatory Visit
Admission: RE | Admit: 2020-09-25 | Discharge: 2020-09-25 | Disposition: A | Discharge status: Home / Self Care | Payer: BC Managed Care – PPO | Source: Ambulatory Visit | Attending: Oncology | Admitting: Oncology

## 2020-09-25 ENCOUNTER — Other Ambulatory Visit: Payer: Self-pay

## 2020-09-25 DIAGNOSIS — C50411 Malignant neoplasm of upper-outer quadrant of right female breast: Secondary | ICD-10-CM

## 2020-09-25 DIAGNOSIS — C7951 Secondary malignant neoplasm of bone: Secondary | ICD-10-CM

## 2020-09-25 DIAGNOSIS — Z170 Estrogen receptor positive status [ER+]: Secondary | ICD-10-CM

## 2020-09-25 MED ORDER — GADOBENATE DIMEGLUMINE 529 MG/ML IV SOLN: 10.0000 mL | Freq: Once | INTRAVENOUS | Status: DC | PRN

## 2020-09-25 MED ORDER — GADOBUTROL 1 MMOL/ML IV SOLN
10.0000 mL | Freq: Once | INTRAVENOUS | Status: AC | PRN
  Administered 2020-09-25: 10 mL via INTRAVENOUS

## 2020-10-02 ENCOUNTER — Other Ambulatory Visit: Payer: Self-pay

## 2020-10-02 ENCOUNTER — Inpatient Hospital Stay (HOSPITAL_BASED_OUTPATIENT_CLINIC_OR_DEPARTMENT_OTHER): Payer: BC Managed Care – PPO | Admitting: Adult Health

## 2020-10-02 ENCOUNTER — Encounter: Payer: Self-pay | Admitting: Adult Health

## 2020-10-02 ENCOUNTER — Inpatient Hospital Stay: Payer: BC Managed Care – PPO

## 2020-10-02 VITALS — BP 126/78 | HR 88 | Temp 97.9°F | Resp 18 | Ht 69.0 in | Wt 215.3 lb

## 2020-10-02 DIAGNOSIS — C50411 Malignant neoplasm of upper-outer quadrant of right female breast: Secondary | ICD-10-CM

## 2020-10-02 DIAGNOSIS — C7951 Secondary malignant neoplasm of bone: Secondary | ICD-10-CM | POA: Diagnosis not present

## 2020-10-02 DIAGNOSIS — Z95828 Presence of other vascular implants and grafts: Secondary | ICD-10-CM

## 2020-10-02 DIAGNOSIS — Z170 Estrogen receptor positive status [ER+]: Secondary | ICD-10-CM

## 2020-10-02 LAB — CMP (CANCER CENTER ONLY)
ALT: 16 U/L (ref 0–44)
AST: 20 U/L (ref 15–41)
Albumin: 3.8 g/dL (ref 3.5–5.0)
Alkaline Phosphatase: 74 U/L (ref 38–126)
Anion gap: 7 (ref 5–15)
BUN: 15 mg/dL (ref 6–20)
CO2: 27 mmol/L (ref 22–32)
Calcium: 10.1 mg/dL (ref 8.9–10.3)
Chloride: 102 mmol/L (ref 98–111)
Creatinine: 0.75 mg/dL (ref 0.44–1.00)
GFR, Estimated: >60 mL/min (ref >60)
Glucose, Bld: 118 mg/dL — ABNORMAL HIGH (ref 70–99)
Potassium: 3.9 mmol/L (ref 3.5–5.1)
Sodium: 136 mmol/L (ref 135–145)
Total Bilirubin: 0.4 mg/dL (ref 0.3–1.2)
Total Protein: 7.9 g/dL (ref 6.5–8.1)

## 2020-10-02 LAB — CBC WITH DIFFERENTIAL (CANCER CENTER ONLY)
Abs Immature Granulocytes: 0.01 10*3/uL (ref 0.00–0.07)
Basophils Absolute: 0 10*3/uL (ref 0.0–0.1)
Basophils Relative: 0 %
Eosinophils Absolute: 0.1 10*3/uL (ref 0.0–0.5)
Eosinophils Relative: 2 %
HCT: 31.8 % — ABNORMAL LOW (ref 36.0–46.0)
Hemoglobin: 10.5 g/dL — ABNORMAL LOW (ref 12.0–15.0)
Immature Granulocytes: 0 %
Lymphocytes Relative: 36 %
Lymphs Abs: 2.5 10*3/uL (ref 0.7–4.0)
MCH: 30.3 pg (ref 26.0–34.0)
MCHC: 33 g/dL (ref 30.0–36.0)
MCV: 91.9 fL (ref 80.0–100.0)
Monocytes Absolute: 0.8 10*3/uL (ref 0.1–1.0)
Monocytes Relative: 12 %
Neutro Abs: 3.5 10*3/uL (ref 1.7–7.7)
Neutrophils Relative %: 50 %
Platelet Count: 252 10*3/uL (ref 150–400)
RBC: 3.46 MIL/uL — ABNORMAL LOW (ref 3.87–5.11)
RDW: 11.2 % — ABNORMAL LOW (ref 11.5–15.5)
WBC Count: 6.9 10*3/uL (ref 4.0–10.5)
nRBC: 0 % (ref 0.0–0.2)

## 2020-10-02 LAB — TSH: TSH: 1.243 u[IU]/mL (ref 0.308–3.960)

## 2020-10-02 MED ORDER — HEPARIN SOD (PORK) LOCK FLUSH 100 UNIT/ML IV SOLN
500.0000 [IU] | Freq: Once | INTRAVENOUS | Status: AC | PRN
  Administered 2020-10-02: 500 [IU]
  Filled 2020-10-02: qty 5

## 2020-10-02 MED ORDER — SODIUM CHLORIDE 0.9% FLUSH
10.0000 mL | INTRAVENOUS | Status: DC | PRN
  Administered 2020-10-02: 10 mL via INTRAVENOUS
  Filled 2020-10-02: qty 10

## 2020-10-02 MED ORDER — SODIUM CHLORIDE 0.9 % IV SOLN
Freq: Once | INTRAVENOUS | Status: AC
Start: 2020-10-02 — End: 2020-10-02
  Filled 2020-10-02: qty 250

## 2020-10-02 MED ORDER — SODIUM CHLORIDE 0.9 % IV SOLN
200.0000 mg | Freq: Once | INTRAVENOUS | Status: AC
  Administered 2020-10-02: 200 mg via INTRAVENOUS
  Filled 2020-10-02: qty 8

## 2020-10-02 MED ORDER — SODIUM CHLORIDE 0.9% FLUSH
10.0000 mL | INTRAVENOUS | Status: DC | PRN
  Administered 2020-10-02: 10 mL
  Filled 2020-10-02: qty 10

## 2020-10-02 NOTE — Patient Instructions (Signed)
[facility] MEDICAL ONCOLOGY  ° Discharge Instructions: °Thank you for choosing [facility] to provide your oncology and hematology care.  ° °If you have a lab appointment with the Cancer Center, please go directly to the Cancer Center and check in at the registration area. °  °Wear comfortable clothing and clothing appropriate for easy access to any Portacath or PICC line.  ° °We strive to give you quality time with your provider. You may need to reschedule your appointment if you arrive late (15 or more minutes).  Arriving late affects you and other patients whose appointments are after yours.  Also, if you miss three or more appointments without notifying the office, you may be dismissed from the clinic at the provider’s discretion.    °  °For prescription refill requests, have your pharmacy contact our office and allow 72 hours for refills to be completed.   ° °Today you received the following chemotherapy and/or immunotherapy agents: Pembrolizumab (Keytruda) °  °To help prevent nausea and vomiting after your treatment, we encourage you to take your nausea medication as directed. ° °BELOW ARE SYMPTOMS THAT SHOULD BE REPORTED IMMEDIATELY: °*FEVER GREATER THAN 100.4 F (38 °C) OR HIGHER °*CHILLS OR SWEATING °*NAUSEA AND VOMITING THAT IS NOT CONTROLLED WITH YOUR NAUSEA MEDICATION °*UNUSUAL SHORTNESS OF BREATH °*UNUSUAL BRUISING OR BLEEDING °*URINARY PROBLEMS (pain or burning when urinating, or frequent urination) °*BOWEL PROBLEMS (unusual diarrhea, constipation, pain near the anus) °TENDERNESS IN MOUTH AND THROAT WITH OR WITHOUT PRESENCE OF ULCERS (sore throat, sores in mouth, or a toothache) °UNUSUAL RASH, SWELLING OR PAIN  °UNUSUAL VAGINAL DISCHARGE OR ITCHING  ° °Items with * indicate a potential emergency and should be followed up as soon as possible or go to the Emergency Department if any problems should occur. ° °Please show the CHEMOTHERAPY ALERT CARD or IMMUNOTHERAPY ALERT CARD  at check-in to the Emergency Department and triage nurse. ° °Should you have questions after your visit or need to cancel or reschedule your appointment, please contact [facility]  Dept: [phone]  and follow the prompts.  Office hours are 8:00 a.m. to 4:30 p.m. Monday - Friday. Please note that voicemails left after 4:00 p.m. may not be returned until the following business day.  We are closed weekends and major holidays. You have access to a nurse at all times for urgent questions. Please call the main number to the clinic Dept: [phone] and follow the prompts. ° ° °For any non-urgent questions, you may also contact your provider using MyChart. We now offer e-Visits for anyone 18 and older to request care online for non-urgent symptoms. For details visit [email or web address]. °  °Also download the MyChart app! Go to the app store, search "MyChart", open the app, select [facility], and log in with your MyChart username and password. ° °Due to Covid, a mask is required upon entering the hospital/clinic. If you do not have a mask, one will be given to you upon arrival. For doctor visits, patients may have 1 support person aged 18 or older with them. For treatment visits, patients cannot have anyone with them due to current Covid guidelines and our immunocompromised population.  ° °

## 2020-10-04 ENCOUNTER — Encounter: Payer: Self-pay | Admitting: Oncology

## 2020-10-05 ENCOUNTER — Other Ambulatory Visit: Payer: Self-pay | Admitting: Oncology

## 2020-10-05 DIAGNOSIS — C50411 Malignant neoplasm of upper-outer quadrant of right female breast: Secondary | ICD-10-CM

## 2020-10-05 MED ORDER — ABEMACICLIB 150 MG PO TABS: 150.0000 mg | ORAL_TABLET | Freq: Two times a day (BID) | ORAL | 6 refills | Status: DC

## 2020-10-05 NOTE — Progress Notes (Signed)
Lolla unfortunately shows disease progression on her repeat breast MRI 09/25/2020.  I am going to switch her to fulvestrant and abemaciclib.  She has a family trip planned July 29 through August 6.  I am going to see if I can get her the first fulvestrant dose on 10/08/2020 and then start the abemaciclib 10/23/2020

## 2020-10-06 ENCOUNTER — Other Ambulatory Visit: Payer: Self-pay | Admitting: Oncology

## 2020-10-06 ENCOUNTER — Telehealth: Payer: Self-pay | Admitting: Oncology

## 2020-10-06 ENCOUNTER — Telehealth: Payer: Self-pay

## 2020-10-06 ENCOUNTER — Telehealth: Payer: Self-pay | Admitting: *Deleted

## 2020-10-06 ENCOUNTER — Encounter: Payer: Self-pay | Admitting: Oncology

## 2020-10-06 ENCOUNTER — Telehealth: Payer: Self-pay | Admitting: Pharmacist

## 2020-10-06 ENCOUNTER — Other Ambulatory Visit (HOSPITAL_COMMUNITY): Payer: Self-pay

## 2020-10-06 DIAGNOSIS — C50411 Malignant neoplasm of upper-outer quadrant of right female breast: Secondary | ICD-10-CM

## 2020-10-06 DIAGNOSIS — Z170 Estrogen receptor positive status [ER+]: Secondary | ICD-10-CM

## 2020-10-06 MED ORDER — ABEMACICLIB 150 MG PO TABS: 150.0000 mg | ORAL_TABLET | Freq: Two times a day (BID) | ORAL | 6 refills | Status: DC

## 2020-10-06 MED ORDER — ABEMACICLIB 150 MG PO TABS
150.0000 mg | ORAL_TABLET | Freq: Two times a day (BID) | ORAL | 6 refills | Status: DC
  Filled 2020-10-06: qty 56, 28d supply, fill #0

## 2020-10-06 NOTE — Telephone Encounter (Signed)
Scheduled appts per 7/25 sch msg. Pt aware.  

## 2020-10-06 NOTE — Telephone Encounter (Addendum)
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  Prescription dose and frequency assessed.  CBC w/ Diff and CMP from 10/02/20 assessed, no relevant lab abnormalities noted.  Current medication list in Epic reviewed, no relevant/significant DDIs with Verzenio identified.  Evaluated chart and no patient barriers to medication adherence noted.   Patient's insurance requires Verzenio be filled through Therapist, nutritional. Prescription redirected for dispensing.  Oral Oncology Clinic will continue to follow for insurance authorization, copayment issues, initial counseling and start date.  Leron Croak, PharmD, BCPS Hematology/Oncology Clinical Pharmacist [facility] [phone] 10/06/2020 9:02 AM

## 2020-10-06 NOTE — Telephone Encounter (Signed)
Oral Oncology Patient Advocate Encounter   Received notification from [facility] that prior authorization for Verzenio is required.   PA submitted on CoverMyMeds Key [id number] Status is pending   Oral Oncology Clinic will continue to follow.  [place] Patient [place] Phone [phone] Fax [phone] 10/06/2020 8:49 AM

## 2020-10-06 NOTE — Telephone Encounter (Signed)
Obtained prior auth for CT and scheduled for 230 pm 7/26 at 230 pm at [facility].  Informed pt of above as well as address and need to be liquids only 4 hours prior- pt verbalized understanding.

## 2020-10-07 ENCOUNTER — Other Ambulatory Visit: Payer: Self-pay

## 2020-10-07 ENCOUNTER — Ambulatory Visit (HOSPITAL_BASED_OUTPATIENT_CLINIC_OR_DEPARTMENT_OTHER)
Admission: RE | Admit: 2020-10-07 | Discharge: 2020-10-07 | Disposition: A | Discharge status: Home / Self Care | Payer: BC Managed Care – PPO | Source: Ambulatory Visit | Attending: Oncology | Admitting: Oncology

## 2020-10-07 ENCOUNTER — Encounter (HOSPITAL_BASED_OUTPATIENT_CLINIC_OR_DEPARTMENT_OTHER): Payer: Self-pay | Admitting: Radiology

## 2020-10-07 DIAGNOSIS — Z170 Estrogen receptor positive status [ER+]: Secondary | ICD-10-CM | POA: Insufficient documentation

## 2020-10-07 DIAGNOSIS — C50411 Malignant neoplasm of upper-outer quadrant of right female breast: Secondary | ICD-10-CM | POA: Diagnosis present

## 2020-10-07 MED ORDER — HEPARIN SOD (PORK) LOCK FLUSH 100 UNIT/ML IV SOLN
500.0000 [IU] | Freq: Once | INTRAVENOUS | Status: AC
  Administered 2020-10-07: 500 [IU] via INTRAVENOUS

## 2020-10-07 MED ORDER — IOHEXOL 350 MG/ML SOLN
60.0000 mL | Freq: Once | INTRAVENOUS | Status: AC | PRN
  Administered 2020-10-07: 60 mL via INTRAVENOUS

## 2020-10-07 NOTE — Telephone Encounter (Signed)
Oral Oncology Patient Advocate Encounter  Prior Authorization for Melynda Keller has been approved.    PA# [id number] Effective dates: 10/06/20 through 10/06/21  Patient must fill at [facility] will continue to follow.   [place] Patient [place] Phone [phone] Fax [phone] 10/07/2020 11:26 AM

## 2020-10-07 NOTE — Telephone Encounter (Signed)
Oral Chemotherapy Pharmacist Encounter  Verzenio (abemaciclib) shipped by [facility] on 10/06/20. Pt will start Verzenio on 10/23/20.  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   Counseled patient on administration, dosing, side effects, monitoring, drug-food interactions, safe handling, storage, and disposal.  Patient will take Verzenio 142m tablets, 1 tablet by mouth twice daily without regard to food.  Patient knows to avoid grapefruit and grapefruit juice.  Verzenio start date: 10/23/20  Adverse effects include but are not limited to: diarrhea, fatigue, nausea, abdominal pain, hair loss, decreased blood counts, and increased liver function tests, and joint pains. Severe, life-threatening, and/or fatal interstitial lung disease (ILD) and/or pneumonitis may occur with CDK 4/6 inhibitors.  Patient has anti-emetic on hand and knows to take it if nausea develops.   Patient has Imodium (anti diarrheal) on hand and will alert the office of 4 or more loose stools above baseline.  Reviewed with patient importance of keeping a medication schedule and plan for any missed doses. No barriers to medication adherence identified.  Medication reconciliation performed and medication/allergy list updated.  Insurance authorization for [employer] been obtained. Test claim at the pharmacy revealed copayment $0 for 1st fill of Verzenio (abemaciclib). [place].  Patient informed the pharmacy will reach out 5-7 days prior to needing next fill of Verzenio to coordinate continued medication acquisition to prevent break in therapy.  All questions answered.  Ms. MKrehervoiced understanding and appreciation.    Medication education handout placed in mail for patient. Patient knows to call the office with questions or concerns. Oral Chemotherapy Clinic phone number provided to patient.   MBenn Moulder PharmD Pharmacy Resident  10/07/2020   10:17 AM Oral Oncology Clinic [phone]

## 2020-10-08 ENCOUNTER — Other Ambulatory Visit: Payer: Self-pay | Admitting: Oncology

## 2020-10-08 ENCOUNTER — Inpatient Hospital Stay: Payer: BC Managed Care – PPO

## 2020-10-08 VITALS — BP 117/74 | HR 78 | Temp 98.7°F | Resp 18

## 2020-10-08 DIAGNOSIS — C50411 Malignant neoplasm of upper-outer quadrant of right female breast: Secondary | ICD-10-CM | POA: Diagnosis not present

## 2020-10-08 DIAGNOSIS — C7951 Secondary malignant neoplasm of bone: Secondary | ICD-10-CM

## 2020-10-08 MED ORDER — FULVESTRANT 250 MG/5ML IM SOLN
INTRAMUSCULAR | Status: AC
  Filled 2020-10-08: qty 10

## 2020-10-08 MED ORDER — FULVESTRANT 250 MG/5ML IM SOLN
500.0000 mg | Freq: Once | INTRAMUSCULAR | Status: AC
  Administered 2020-10-08: 500 mg via INTRAMUSCULAR

## 2020-10-08 NOTE — Patient Instructions (Signed)
Fulvestrant injection What is this medication? FULVESTRANT (ful VES trant) blocks the effects of estrogen. It is used to treat breast cancer. This medicine may be used for other purposes; ask your health care provider or pharmacist if you have questions. COMMON BRAND NAME(S): FASLODEX What should I tell my care team before I take this medication? They need to know if you have any of these conditions: bleeding disorders liver disease low blood counts, like low white cell, platelet, or red cell counts an unusual or allergic reaction to fulvestrant, other medicines, foods, dyes, or preservatives pregnant or trying to get pregnant breast-feeding How should I use this medication? This medicine is for injection into a muscle. It is usually given by a health care professional in a hospital or clinic setting. Talk to your pediatrician regarding the use of this medicine in children. Special care may be needed. Overdosage: If you think you have taken too much of this medicine contact a poison control center or emergency room at once. NOTE: This medicine is only for you. Do not share this medicine with others. What if I miss a dose? It is important not to miss your dose. Call your doctor or health care professional if you are unable to keep an appointment. What may interact with this medication? medicines that treat or prevent blood clots like warfarin, enoxaparin, dalteparin, apixaban, dabigatran, and rivaroxaban This list may not describe all possible interactions. Give your health care provider a list of all the medicines, herbs, non-prescription drugs, or dietary supplements you use. Also tell them if you smoke, drink alcohol, or use illegal drugs. Some items may interact with your medicine. What should I watch for while using this medication? Your condition will be monitored carefully while you are receiving this medicine. You will need important blood work done while you are taking this  medicine. Do not become pregnant while taking this medicine or for at least 1 year after stopping it. Women of child-bearing potential will need to have a negative pregnancy test before starting this medicine. Women should inform their doctor if they wish to become pregnant or think they might be pregnant. There is a potential for serious side effects to an unborn child. Men should inform their doctors if they wish to father a child. This medicine may lower sperm counts. Talk to your health care professional or pharmacist for more information. Do not breast-feed an infant while taking this medicine or for 1 year after the last dose. What side effects may I notice from receiving this medication? Side effects that you should report to your doctor or health care professional as soon as possible: allergic reactions like skin rash, itching or hives, swelling of the face, lips, or tongue feeling faint or lightheaded, falls pain, tingling, numbness, or weakness in the legs signs and symptoms of infection like fever or chills; cough; flu-like symptoms; sore throat vaginal bleeding Side effects that usually do not require medical attention (report to your doctor or health care professional if they continue or are bothersome): aches, pains constipation diarrhea headache hot flashes nausea, vomiting pain at site where injected stomach pain This list may not describe all possible side effects. Call your doctor for medical advice about side effects. You may report side effects to FDA at 1-800-FDA-1088. Where should I keep my medication? This drug is given in a hospital or clinic and will not be stored at home. NOTE: This sheet is a summary. It may not cover all possible information. If you have   questions about this medicine, talk to your doctor, pharmacist, or health care provider.  2022 Elsevier/Gold Standard (2017-06-08 11:34:41)  

## 2020-10-13 ENCOUNTER — Other Ambulatory Visit (HOSPITAL_COMMUNITY): Payer: Self-pay

## 2020-10-21 NOTE — Progress Notes (Addendum)
[facility]  Telephone:([phone]) [phone] Fax:([phone]     ID: [name] DOB: [birth year]  MR#: [id number]  HAL#:[id number]  Patient Care Team: [name], MD as PCP - General (Family Medicine) Mauro Kaufmann, RN as Oncology Nurse Navigator Rockwell Germany, RN as Oncology Nurse Navigator Rolm Bookbinder, MD as Consulting Physician (General Surgery) Omer Monter, Virgie Dad, MD as Consulting Physician (Oncology) Kyung Rudd, MD as Consulting Physician (Radiation Oncology) Cheri Fowler, MD as Consulting Physician (Obstetrics and Gynecology) Elsie Saas, MD as Consulting Physician (Orthopedic Surgery) Juanita Craver, MD as Consulting Physician (Gastroenterology) Chauncey Cruel, MD OTHER MD:  CHIEF COMPLAINT: weakly estrogen receptor positive breast cancer  CURRENT TREATMENT: Fulvestrant, abemaciclib   INTERVAL HISTORY: [name] returns today for follow up and treatment of her 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  She just returned from a family trip to [place] which she greatly enjoyed.  Since her last visit, she underwent restaging chest CT on 10/07/2020 showing: interval progression of right axillary; similar appearance of numerous scattered sclerotic bone metastases of varying size; at least one retroperitoneal lymph node has increased in size.  There is no evidence of lung or liver involvement.  In light of recent progression on breast MRI, she was switched to fulvestrant on 10/08/20. She is scheduled to start abemaciclib tomorrow, 10/23/2020.  She has a good understanding of the possible toxicities side effects and complications of these agents.  She also receives zoledronate every 12 weeks, with her most recent dose 08/03/2020.  Her next infusion is scheduled for 11/05/2020, with her third fulvestrant dose.  She is scheduled to see Dr. Donne Hazel 11/06/2020 for consideration of right modified radical mastectomy for local control.   REVIEW OF  SYSTEMS: [name] feels fine.  She has no pain, no weight loss, no fatigue, and generally no symptoms related to her cancer.  She tolerated the anastrozole well but of course we are discontinuing that as she has started fulvestrant.  She tolerated the fulvestrant injections without complication.  She f is of course alarmed by the palpable right axillary adenopathy.  She is not aware of any right upper extremity lymphedema at this point.  Detailed review of systems was otherwise stable   COVID 19 VACCINATION STATUS: fully vaccinated Therapist, music), with booster   HISTORY OF CURRENT ILLNESS: From the original intake note:  [name] had routine screening mammography showing a possible abnormality in the right breast and a prominent intramammary lymph node. She underwent right diagnostic mammography with tomography and right breast ultrasonography at [facility] on 03/26/2020 showing: breast density category B; palpable 1.8 cm right breast mass at 10 o'clock, which feels much larger than what is visualized sonographically; suspicious 0.6 cm intramammary lymph node in right breast at 10 o'clock; at least 3 morphologically abnormal lymph nodes in right axilla.  Accordingly on 03/27/2020 she proceeded to biopsy of the right breast areas in question. The pathology from this procedure ([id number]) showed:  1. Right Breast, 10 o'clock (mass)  - invasive mammary carcinoma, e-cadherin positive with a lobular growth pattern, grade 2  - mammary carcinoma in situ  - Prognostic indicators significant for: estrogen receptor, 1% positive and progesterone receptor, 1% positive, both with strong staining intensity. Proliferation marker Ki67 at 30%. HER2 negative by immunohistochemistry (0). 2. Right Breast, 10 o'clock (intramammary node)  - mammary carcinoma involving lymph node and perinodal soft tissue  -  Prognostic indicators significant for: estrogen receptor, 1% positive with strong staining intensity and  progesterone receptor, 0% negative. Proliferation marker Ki67 at 40%. HER2 negative by immunohistochemistry (0).  Cancer Staging Malignant neoplasm of upper-outer quadrant of right breast in female, estrogen receptor positive ([place]) Staging form: Breast, AJCC 8th Edition - Pathologic stage from 03/31/2020: Stage IV (cM1) - Signed by Gardenia Phlegm, NP on 05/01/2020 - Clinical stage from 04/08/2020: Stage IIB (cT1c, cN1(f), cM0, G2, ER-, PR-, HER2-) - Signed by Chauncey Cruel, MD on 04/08/2020 Stage prefix: Initial diagnosis Method of lymph node assessment: Core biopsy  The patient's subsequent history is as detailed below.   PAST MEDICAL HISTORY: Past Medical History:  Diagnosis Date   Allergy    Arthritis 2018   hands, left knee   Breast cancer ([place]) 03-27-2020   right breast IMC   Eczema    Family history of breast cancer    Family history of lung cancer    Family history of prostate cancer    Hypertension     PAST SURGICAL HISTORY: Past Surgical History:  Procedure Laterality Date   ABDOMINAL HYSTERECTOMY     CESAREAN SECTION     KNEE ARTHROSCOPY W/ ACL RECONSTRUCTION Left    PORTACATH PLACEMENT Left 04/23/2020   Procedure: LEFT SIDE PORT PLACEMENT WITH ULTRASOUND GUIDANCE;  Surgeon: Rolm Bookbinder, MD;  Location: [facility];  Service: General;  Laterality: Left;  230PM START TIME PLEASE ROOM 2 THANKS   TUBAL LIGATION      FAMILY HISTORY: Family History  Problem Relation Age of Onset   Breast cancer Mother 69   Arthritis Mother    Diabetes Father    Kidney disease Father    Hypertension Sister    Heart disease Maternal Grandmother    Breast cancer Paternal Grandmother        dx >50   Heart disease Maternal Aunt    Breast cancer Maternal Aunt 77   Other Maternal Aunt        brain tumor (not cancerous)   Lung cancer Maternal Aunt    Prostate cancer Cousin 76       localized   Her parents are both living, her father age 6 and mother  age 56, as of 03/2020. [name] has 1 brother and 2 sisters. She reports breast cancer in her mother at age 44, her maternal aunt, and her paternal grandmother. Her maternal aunt also has a history of lung cancer. She also reports prostate cancer in a maternal cousin in his 38's.   GYNECOLOGIC HISTORY:  No LMP recorded. Patient has had a hysterectomy. Menarche: 55 years old Age at first live birth: 55 years old [place] P 2 LMP 2010 with hysterectomy Contraceptive: prior use without issue HRT never used  Hysterectomy? Yes, 2010 for fibroids BSO? 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 She is divorced. She lives at home with son [name], age 48, who works for [employer]. Son Hayden, Brooke Bonito, age 23, works as a Development worker, international aid in [place], [place]. Kynli has no grandchildren. She attends [employer].    ADVANCED DIRECTIVES: not in place; considering naming mother Melody Haver, son [name], or son Raynald as healthcare powers of attorney   HEALTH MAINTENANCE: Social History   Tobacco Use   Smoking status: Former    Packs/day: 0.25    Years: 36.00    Pack years: 9.00    Types: Cigarettes    Quit date: 04/22/2017    Years since quitting: 3.5  Smokeless tobacco: Never   Tobacco comments:    She is trying to quit.    Substance Use Topics   Alcohol use: Yes    Alcohol/week: 3.0 standard drinks    Types: 3 Glasses of wine per week    Comment: Occasional    Drug use: Never     Colonoscopy: age 80  PAP: date unsure  Bone density: never done   No Known Allergies  Current Outpatient Medications  Medication Sig Dispense Refill   loperamide (IMODIUM) 2 MG capsule Take 2 tablets after first diarrheal bowel movement, then one tablet after each following diarrheal movement; maximum 6 tablets/ day 60 capsule 6   abemaciclib (VERZENIO) 150 MG tablet Take 1 tablet (150 mg total) by mouth 2 (two) times  daily. Swallow tablets whole. Do not chew, crush, or split tablets before swallowing. Start 10/13/2020 56 tablet 6   acetaminophen (TYLENOL) 500 MG tablet Take 1,000 mg by mouth every 6 (six) hours as needed for moderate pain.     Boric Acid Vaginal 600 MG SUPP Place 600 mg vaginally daily as needed (yeast infections).     Cholecalciferol (VITAMIN D) 125 MCG (5000 UT) CAPS Take 5,000 Units by mouth daily.     clobetasol ointment (TEMOVATE) 2.58 % Apply 1 application topically 2 (two) times daily as needed (rash/itching).     Cyanocobalamin (B-12) 5000 MCG CAPS Take 5,000 mcg by mouth daily.     propranolol (INDERAL) 10 MG tablet Take 10 mg by mouth 2 (two) times daily.     spironolactone (ALDACTONE) 25 MG tablet Take 1 tablet by mouth 2 (two) times daily.     No current facility-administered medications for this visit.    OBJECTIVE: African-American woman who appears well  Vitals:   10/22/20 1033  BP: (!) 141/79  Pulse: 80  Resp: 18  Temp: 98.1 F (36.7 C)  SpO2: 100%       Body mass index is 32.55 kg/m.   Wt Readings from Last 3 Encounters:  10/22/20 220 lb 6.4 oz (100 kg)  10/02/20 215 lb 4.8 oz (97.7 kg)  08/03/20 214 lb 12 oz (97.4 kg)     ECOG FS:1 - Symptomatic but completely ambulatory  Sclerae unicteric, EOMs intact Wearing a mask No cervical or supraclavicular adenopathy Lungs no rales or rhonchi Heart regular rate and rhythm Abd soft, nontender, positive bowel sounds MSK no focal spinal tenderness, no right upper extremity lymphedema Neuro: nonfocal, well oriented, appropriate affect Breasts: The upper outer quadrant of the breast appears a bit fuller and firmer but I do not palpate a clear edge.  There is right axillary adenopathy which is easily palpable.  Left breast and left axilla are benign.  LAB RESULTS:  CMP     Component Value Date/Time   NA 136 10/02/2020 1055   K 3.9 10/02/2020 1055   CL 102 10/02/2020 1055   CO2 27 10/02/2020 1055   GLUCOSE  118 (H) 10/02/2020 1055   BUN 15 10/02/2020 1055   CREATININE 0.75 10/02/2020 1055   CREATININE 0.69 09/24/2013 0819   CALCIUM 10.1 10/02/2020 1055   PROT 7.9 10/02/2020 1055   ALBUMIN 3.8 10/02/2020 1055   AST 20 10/02/2020 1055   ALT 16 10/02/2020 1055   ALKPHOS 74 10/02/2020 1055   BILITOT 0.4 10/02/2020 1055   GFRNONAA >60 10/02/2020 1055   GFRNONAA >89 09/24/2013 0819   GFRAA >89 09/24/2013 0819    No results found for: TOTALPROTELP, ALBUMINELP, A1GS, A2GS, BETS, BETA2SER,  GAMS, MSPIKE, SPEI  Lab Results  Component Value Date   WBC 7.0 10/22/2020   NEUTROABS 3.9 10/22/2020   HGB 10.6 (L) 10/22/2020   HCT 33.3 (L) 10/22/2020   MCV 92.0 10/22/2020   PLT 219 10/22/2020    No results found for: LABCA2  No components found for: [id number]  No results for input(s): INR in the last 168 hours.  No results found for: LABCA2  No results found for: WHQ759  No results found for: FMB846  No results found for: KZL935  No results found for: CA2729  No components found for: HGQUANT  No results found for: CEA1 / No results found for: CEA1   No results found for: AFPTUMOR  No results found for: CHROMOGRNA  No results found for: KPAFRELGTCHN, LAMBDASER, KAPLAMBRATIO (kappa/lambda light chains)  No results found for: HGBA, HGBA2QUANT, HGBFQUANT, HGBSQUAN (Hemoglobinopathy evaluation)   No results found for: LDH  No results found for: IRON, TIBC, IRONPCTSAT (Iron and TIBC)  No results found for: FERRITIN  Urinalysis No results found for: COLORURINE, APPEARANCEUR, LABSPEC, PHURINE, GLUCOSEU, HGBUR, BILIRUBINUR, KETONESUR, PROTEINUR, UROBILINOGEN, NITRITE, LEUKOCYTESUR   STUDIES: CT Chest W Contrast  Result Date: 10/09/2020 CLINICAL DATA:  Breast cancer.  Restaging. EXAM: CT CHEST WITH CONTRAST TECHNIQUE: Multidetector CT imaging of the chest was performed during intravenous contrast administration. CONTRAST:  52m OMNIPAQUE IOHEXOL 350 MG/ML SOLN COMPARISON:   07/10/2018 to FINDINGS: Cardiovascular: The heart size is normal. No substantial pericardial effusion. Mild coronary artery calcification is evident. Left Port-A-Cath tip is positioned in the distal SVC. Mediastinum/Nodes: No mediastinal lymphadenopathy. There is no hilar lymphadenopathy. The esophagus has normal imaging features. Interval progression of right axillary lymphadenopathy. 2.4 cm short axis right axillary node on 37/2 was 1.2 cm previously. 1.9 cm posterior right axillary node on 42/2 is 1.9 cm today compared to 1.0 cm previously. 1.7 cm short axis inferior right axillary node on 36/2 was 1.0 cm previously (remeasured). Other right axillary lymph nodes are similarly progressed in the interval. Lungs/Pleura: No suspicious pulmonary nodule or mass. No focal airspace consolidation. No pleural effusion. Upper Abdomen: 2 cm short axis left para-aortic lymph node on image 33/7 has been incompletely visualized but represents the same lymph node measured previously at 1 cm short axis (upper normal). Other apparent retroperitoneal lymphadenopathy is only partially visible on the inferior-most images of this study. Musculoskeletal: Numerous scattered sclerotic bone metastases of varying size are again noted in the thoracic spine, bilateral ribs, and both scapulae. Posterior right ninth rib lesion is stable. Lytic component of this lesion was measured previously at 1.3 cm and is stable in the interval. IMPRESSION: 1. Interval progression of right axillary lymphadenopathy consistent with metastatic disease. 2. Interval progression of upper abdominal retroperitoneal lymphadenopathy, incompletely visualized. Dedicated CT abdomen/pelvis with contrast may prove helpful to further evaluate. 3. Similar appearance of numerous scattered sclerotic bone metastases of varying size. 4. Aortic Atherosclerosis (ICD10-I70.0). Electronically Signed   By: EMisty StanleyM.D.   On: 10/09/2020 06:43   MR BREAST BILATERAL W WO  CONTRAST INC CAD  Result Date: 09/28/2020 CLINICAL DATA:  55year old with biopsy proven right breast cancer and metastatic axillary adenopathy. Status post chemotherapy which ended on 07/10/2020. LABS:  None obtained at the time of imaging. EXAM: BILATERAL BREAST MRI WITH AND WITHOUT CONTRAST TECHNIQUE: Multiplanar, multisequence MR images of both breasts were obtained prior to and following the intravenous administration of 10 ml of Gadavist Three-dimensional MR images were rendered by post-processing of the original MR data  on an independent workstation. The three-dimensional MR images were interpreted, and findings are reported in the following complete MRI report for this study. Three dimensional images were evaluated at the independent interpreting workstation using the DynaCAD thin client. COMPARISON:  Previous exam(s) including prior MRIs dated 06/30/2020 and 04/15/2020. FINDINGS: Breast composition: b. Scattered fibroglandular tissue. Background parenchymal enhancement: Moderate. Right breast: The mass in the upper-outer quadrant of the right breast associated with a ribbon shaped clip measures 1.9 x 0.6 x 1.1 cm (series 11, image 56). Previously it measured 1.4 x 0.6 x 1.1 cm on the 06/30/2020 study. There is non masslike patchy enhancement anteriorly extending towards the nipple and posteriorly measuring 6.1 x 4.3 cm in anterior-posterior and transverse dimensions (series 11, images 64 and 59) . It is more prominent than the previous exam dated 06/30/2020. There is a second signal void artifact in the upper-outer quadrant of the breast where the intramammary lymph node was biopsied. There is a 1 cm enhancing mass in this area (series 8, image 54). Left breast: No mass or abnormal enhancement. Lymph nodes: The right axillary adenopathy including the retropectoral lymph nodes have increased in size compared to the prior exam. The lymph node that was previously biopsied now measures 3.1 cm (series 8, image  33) compared to 2.0 cm on the 06/30/2020 study. There are at least 8 enlarged lymph nodes. Ancillary findings: 7 mm enhancing mass in the inferior sternum on the left is unchanged (series 8, image 101). IMPRESSION: Interval prominence of the mass in the upper-outer quadrant of the right breast as well as the non masslike patchy enhancement extending anteriorly and posteriorly. Interval increase in the size of the right axillary adenopathy. No interval change in the enhancing mass in the inferior sternum on the left. RECOMMENDATION: Continued treatment planning is recommended. BI-RADS CATEGORY  6: Known biopsy-proven malignancy. Electronically Signed   By: Lillia Mountain M.D.   On: 09/28/2020 11:52     ELIGIBLE FOR AVAILABLE RESEARCH PROTOCOL: no  ASSESSMENT: 55 y.o. [place] woman status post right breast upper outer quadrant biopsy 03/27/2020 for a clinical T1c N3 M1, stageIV invasive ductal carcinoma, functionally triple negative, with an MIB-1 of 30-40%  (a) chest CT scan 04/20/2020 shows in addition to the breast mass and regional adenopathy, lytic and sclerotic bone changes, possible left thoracic adenopathy, and indeterminant left pleural nodularity  (b) bone scan 04/21/2020 confirms multiple bone lesions  (c) thoracic MRI confirms diffuse bony metastatic disease but shows no epidural soft tissue component and no cord compression  (1) genetics testing 04/24/2020 through the [facility] RNA[facility] panel found no deleterious mutations in APC, ATM, AXIN2, BARD1, BMPR1A, BRCA1, BRCA2, BRIP1, CDH1, CDK4, CDKN2A, CHEK2, DICER1, EPCAM, GREM1, HOXB13, MEN1, MLH1, MSH2, MSH3, MSH6, MUTYH, NBN, NF1, NF2, NTHL1, PALB2, PMS2, POLD1, POLE, PTEN, RAD51C, RAD51D, RECQL, RET, SDHA, SDHAF2, SDHB, SDHC, SDHD, SMAD4, SMARCA4, STK11, TP53, TSC1, TSC2, and VHL.  RNA data is routinely analyzed for use in variant interpretation for all genes.  (2) neoadjuvant chemotherapy will consist of pembrolizumab every  3 weeks, carboplatin every 3 weeks x 4 with paclitaxel weekly x12, started 04/24/2020, completed 07/10/2020  (a) consider doxorubicin and cyclophosphamide every 3 weeks x 4 at disease progression  (3) zoledronate started 05/08/2018, repeated every 12 weeks  (4) definitive surgery being considered for local control  (5) PD-L1 and molecular studies on the 03/27/2020 biopsy requested 10/22/2020  (6) restaging studies:  (a) post-chemo breast MRI 06/30/2020 shows significant response  (b) CT chest  04/28 shows decreased right axillary adenopathy, no lung or liver lesions, healing of lytic bone lesions  (7) adjuvant pembrolizumab resumed 07/31/2020, continued to disease progression  (8) anastrozole started 07/26/2020 given (weak) estrogen and progesterone receptor positivity  (A) MRI breast 09/28/2020 shows local progression in the right breast and axilla-- anastrozole discontinued  (B) CT of the chest 10/08/2018 shows no lung or liver involvement, bone lesions as before, incompletely imaged previously noted retroperitoneal adenopathy   (9) fulvestrant started 10/08/2020  (A) abemaciclib added 10/24/2018   PLAN: Usha is tolerating the fulvestrant well.  She will receive a dose today and again in 2 weeks and then monthly.    We are adding abemaciclib as of tomorrow.  Of course the diarrhea is the main concern.  She already has Imodium on hand and today we discussed exactly how she should take this and when she needs to call us.  She also has Compazine on hand as this can cause nausea.  Nour is aware that we are trying to find out if the very slight estrogen and progesterone receptor positivity that her tumor had is going to give [place] a response.  If she does not respond we will get back to chemotherapy.  I am trying to avoid that for her if possible but she understands that may be where we end up.  In the meantime she does have local progression.  I am concerned that this may breakthrough to  chest wall and skin, because significant lymphedema and generally all the problems that on controlled local disease can cause.Marland Kitchen  She will be seeing her surgeon later this month to discuss modified radical mastectomy.  She understands that the purpose of the surgery would not be to prolong life or provide a cure but that it is purely for optimizing local control.  I have encouraged her to call us with any questions or concerns and may develop before her next visit  Total encounter time 35 minutes.Sarajane Jews C. Jakoby Melendrez, MD 10/22/20 10:56 AM Medical Oncology and Hematology [facility] [place], [place] [address] Tel. [phone]    Fax. [phone]   I, Wilburn Mylar, am acting as scribe for Dr. Virgie Dad. Estoria Geary.  I, Lurline Del MD, have reviewed the above documentation for accuracy and completeness, and I agree with the above.    *Total Encounter Time as defined by the Centers for Medicare and Medicaid Services includes, in addition to the face-to-face time of a patient visit (documented in the note above) non-face-to-face time: obtaining and reviewing outside history, ordering and reviewing medications, tests or procedures, care coordination (communications with other health care professionals or caregivers) and documentation in the medical record.

## 2020-10-22 ENCOUNTER — Inpatient Hospital Stay (HOSPITAL_BASED_OUTPATIENT_CLINIC_OR_DEPARTMENT_OTHER): Payer: BC Managed Care – PPO | Admitting: Oncology

## 2020-10-22 ENCOUNTER — Inpatient Hospital Stay: Payer: BC Managed Care – PPO

## 2020-10-22 ENCOUNTER — Other Ambulatory Visit: Payer: Self-pay

## 2020-10-22 ENCOUNTER — Inpatient Hospital Stay: Payer: BC Managed Care – PPO | Attending: Oncology

## 2020-10-22 VITALS — BP 141/79 | HR 80 | Temp 98.1°F | Resp 18 | Ht 69.0 in | Wt 220.4 lb

## 2020-10-22 DIAGNOSIS — Z5111 Encounter for antineoplastic chemotherapy: Secondary | ICD-10-CM | POA: Insufficient documentation

## 2020-10-22 DIAGNOSIS — Z9221 Personal history of antineoplastic chemotherapy: Secondary | ICD-10-CM | POA: Diagnosis not present

## 2020-10-22 DIAGNOSIS — Z170 Estrogen receptor positive status [ER+]: Secondary | ICD-10-CM | POA: Diagnosis not present

## 2020-10-22 DIAGNOSIS — Z87891 Personal history of nicotine dependence: Secondary | ICD-10-CM | POA: Diagnosis not present

## 2020-10-22 DIAGNOSIS — C50411 Malignant neoplasm of upper-outer quadrant of right female breast: Secondary | ICD-10-CM

## 2020-10-22 DIAGNOSIS — C7951 Secondary malignant neoplasm of bone: Secondary | ICD-10-CM

## 2020-10-22 DIAGNOSIS — Z95828 Presence of other vascular implants and grafts: Secondary | ICD-10-CM

## 2020-10-22 DIAGNOSIS — Z79899 Other long term (current) drug therapy: Secondary | ICD-10-CM | POA: Insufficient documentation

## 2020-10-22 DIAGNOSIS — I10 Essential (primary) hypertension: Secondary | ICD-10-CM | POA: Diagnosis not present

## 2020-10-22 DIAGNOSIS — Z90710 Acquired absence of both cervix and uterus: Secondary | ICD-10-CM | POA: Diagnosis not present

## 2020-10-22 LAB — COMPREHENSIVE METABOLIC PANEL
ALT: 16 U/L (ref 0–44)
AST: 17 U/L (ref 15–41)
Albumin: 3.7 g/dL (ref 3.5–5.0)
Alkaline Phosphatase: 81 U/L (ref 38–126)
Anion gap: 5 (ref 5–15)
BUN: 14 mg/dL (ref 6–20)
CO2: 26 mmol/L (ref 22–32)
Calcium: 10.1 mg/dL (ref 8.9–10.3)
Chloride: 108 mmol/L (ref 98–111)
Creatinine, Ser: 0.79 mg/dL (ref 0.44–1.00)
GFR, Estimated: >60 mL/min (ref >60)
Glucose, Bld: 98 mg/dL (ref 70–99)
Potassium: 4 mmol/L (ref 3.5–5.1)
Sodium: 139 mmol/L (ref 135–145)
Total Bilirubin: 0.3 mg/dL (ref 0.3–1.2)
Total Protein: 7.5 g/dL (ref 6.5–8.1)

## 2020-10-22 LAB — CBC WITH DIFFERENTIAL/PLATELET
Abs Immature Granulocytes: 0.02 10*3/uL (ref 0.00–0.07)
Basophils Absolute: 0 10*3/uL (ref 0.0–0.1)
Basophils Relative: 0 %
Eosinophils Absolute: 0.1 10*3/uL (ref 0.0–0.5)
Eosinophils Relative: 1 %
HCT: 33.3 % — ABNORMAL LOW (ref 36.0–46.0)
Hemoglobin: 10.6 g/dL — ABNORMAL LOW (ref 12.0–15.0)
Immature Granulocytes: 0 %
Lymphocytes Relative: 32 %
Lymphs Abs: 2.2 10*3/uL (ref 0.7–4.0)
MCH: 29.3 pg (ref 26.0–34.0)
MCHC: 31.8 g/dL (ref 30.0–36.0)
MCV: 92 fL (ref 80.0–100.0)
Monocytes Absolute: 0.8 10*3/uL (ref 0.1–1.0)
Monocytes Relative: 11 %
Neutro Abs: 3.9 10*3/uL (ref 1.7–7.7)
Neutrophils Relative %: 56 %
Platelets: 219 10*3/uL (ref 150–400)
RBC: 3.62 MIL/uL — ABNORMAL LOW (ref 3.87–5.11)
RDW: 11.6 % (ref 11.5–15.5)
WBC: 7 10*3/uL (ref 4.0–10.5)
nRBC: 0 % (ref 0.0–0.2)

## 2020-10-22 LAB — T4, FREE: Free T4: 0.89 ng/dL (ref 0.61–1.12)

## 2020-10-22 MED ORDER — SODIUM CHLORIDE 0.9% FLUSH
10.0000 mL | Freq: Once | INTRAVENOUS | Status: AC
  Administered 2020-10-22: 10 mL via INTRAVENOUS
  Filled 2020-10-22: qty 10

## 2020-10-22 MED ORDER — LOPERAMIDE HCL 2 MG PO CAPS: ORAL_CAPSULE | ORAL | 6 refills | Status: AC

## 2020-10-22 MED ORDER — HEPARIN SOD (PORK) LOCK FLUSH 100 UNIT/ML IV SOLN
500.0000 [IU] | Freq: Once | INTRAVENOUS | Status: AC
Start: 2020-10-22 — End: 2020-10-22
  Administered 2020-10-22: 500 [IU] via INTRAVENOUS
  Filled 2020-10-22: qty 5

## 2020-10-22 MED ORDER — PROCHLORPERAZINE MALEATE 10 MG PO TABS: 10.0000 mg | ORAL_TABLET | Freq: Four times a day (QID) | ORAL | 4 refills | Status: DC | PRN

## 2020-10-22 MED ORDER — FULVESTRANT 250 MG/5ML IM SOLN
500.0000 mg | Freq: Once | INTRAMUSCULAR | Status: AC
  Administered 2020-10-22: 500 mg via INTRAMUSCULAR
  Filled 2020-10-22: qty 10

## 2020-10-22 NOTE — Progress Notes (Signed)
Per Dr.Magrinat note only Faslodex today, pt aware

## 2020-10-22 NOTE — Patient Instructions (Signed)
[facility]  Discharge Instructions: Thank you for choosing [facility] to provide your oncology and hematology care.   If you have a lab appointment with the [facility], please go directly to the [facility] and check in at the registration area.   Wear comfortable clothing and clothing appropriate for easy access to any Portacath or PICC line.   We strive to give you quality time with your provider. You may need to reschedule your appointment if you arrive late (15 or more minutes).  Arriving late affects you and other patients whose appointments are after yours.  Also, if you miss three or more appointments without notifying the office, you may be dismissed from the clinic at the provider's discretion.      For prescription refill requests, have your pharmacy contact our office and allow 72 hours for refills to be completed.    Today you received the following: faslodex      To help prevent nausea and vomiting after your treatment, we encourage you to take your nausea medication as directed.  BELOW ARE SYMPTOMS THAT SHOULD BE REPORTED IMMEDIATELY: *FEVER GREATER THAN 100.4 F (38 C) OR HIGHER *CHILLS OR SWEATING *NAUSEA AND VOMITING THAT IS NOT CONTROLLED WITH YOUR NAUSEA MEDICATION *UNUSUAL SHORTNESS OF BREATH *UNUSUAL BRUISING OR BLEEDING *URINARY PROBLEMS (pain or burning when urinating, or frequent urination) *BOWEL PROBLEMS (unusual diarrhea, constipation, pain near the anus) TENDERNESS IN MOUTH AND THROAT WITH OR WITHOUT PRESENCE OF ULCERS (sore throat, sores in mouth, or a toothache) UNUSUAL RASH, SWELLING OR PAIN  UNUSUAL VAGINAL DISCHARGE OR ITCHING   Items with * indicate a potential emergency and should be followed up as soon as possible or go to the Emergency Department if any problems should occur.  Please show the CHEMOTHERAPY ALERT CARD or IMMUNOTHERAPY ALERT CARD at check-in to the Emergency Department and triage  nurse.  Should you have questions after your visit or need to cancel or reschedule your appointment, please contact [facility]  Dept: [phone]  and follow the prompts.  Office hours are 8:00 a.m. to 4:30 p.m. Monday - Friday. Please note that voicemails left after 4:00 p.m. may not be returned until the following business day.  We are closed weekends and major holidays. You have access to a nurse at all times for urgent questions. Please call the main number to the clinic Dept: [phone] and follow the prompts.   For any non-urgent questions, you may also contact your provider using MyChart. We now offer e-Visits for anyone 64 and older to request care online for non-urgent symptoms. For details visit [email or web address].   Also download the MyChart app! Go to the app store, search "MyChart", open the app, select [facility], and log in with your MyChart username and password.  Due to Covid, a mask is required upon entering the hospital/clinic. If you do not have a mask, one will be given to you upon arrival. For doctor visits, patients may have 1 support person aged 29 or older with them. For treatment visits, patients cannot have anyone with them due to current Covid guidelines and our immunocompromised population.

## 2020-10-26 ENCOUNTER — Telehealth: Payer: Self-pay | Admitting: Oncology

## 2020-10-26 NOTE — Telephone Encounter (Signed)
Scheduled appointment per 08/11 los. Left message.

## 2020-10-29 ENCOUNTER — Encounter (HOSPITAL_COMMUNITY): Payer: Self-pay | Admitting: Oncology

## 2020-11-05 ENCOUNTER — Ambulatory Visit: Payer: BC Managed Care – PPO

## 2020-11-06 ENCOUNTER — Other Ambulatory Visit: Payer: Self-pay

## 2020-11-06 ENCOUNTER — Inpatient Hospital Stay: Payer: BC Managed Care – PPO

## 2020-11-06 ENCOUNTER — Other Ambulatory Visit: Payer: Self-pay | Admitting: General Surgery

## 2020-11-06 VITALS — BP 112/66 | HR 78 | Temp 98.7°F | Resp 18 | Wt 216.1 lb

## 2020-11-06 DIAGNOSIS — C7951 Secondary malignant neoplasm of bone: Secondary | ICD-10-CM

## 2020-11-06 DIAGNOSIS — Z170 Estrogen receptor positive status [ER+]: Secondary | ICD-10-CM

## 2020-11-06 DIAGNOSIS — C50411 Malignant neoplasm of upper-outer quadrant of right female breast: Secondary | ICD-10-CM | POA: Diagnosis not present

## 2020-11-06 LAB — CBC WITH DIFFERENTIAL/PLATELET
Abs Immature Granulocytes: 0.01 10*3/uL (ref 0.00–0.07)
Basophils Absolute: 0 10*3/uL (ref 0.0–0.1)
Basophils Relative: 1 %
Eosinophils Absolute: 0.1 10*3/uL (ref 0.0–0.5)
Eosinophils Relative: 2 %
HCT: 32.5 % — ABNORMAL LOW (ref 36.0–46.0)
Hemoglobin: 10.4 g/dL — ABNORMAL LOW (ref 12.0–15.0)
Immature Granulocytes: 0 %
Lymphocytes Relative: 53 %
Lymphs Abs: 2.2 10*3/uL (ref 0.7–4.0)
MCH: 29.3 pg (ref 26.0–34.0)
MCHC: 32 g/dL (ref 30.0–36.0)
MCV: 91.5 fL (ref 80.0–100.0)
Monocytes Absolute: 0.3 10*3/uL (ref 0.1–1.0)
Monocytes Relative: 8 %
Neutro Abs: 1.5 10*3/uL — ABNORMAL LOW (ref 1.7–7.7)
Neutrophils Relative %: 36 %
Platelets: 162 10*3/uL (ref 150–400)
RBC: 3.55 MIL/uL — ABNORMAL LOW (ref 3.87–5.11)
RDW: 12 % (ref 11.5–15.5)
WBC: 4.1 10*3/uL (ref 4.0–10.5)
nRBC: 0 % (ref 0.0–0.2)

## 2020-11-06 LAB — COMPREHENSIVE METABOLIC PANEL
ALT: 13 U/L (ref 0–44)
AST: 17 U/L (ref 15–41)
Albumin: 3.8 g/dL (ref 3.5–5.0)
Alkaline Phosphatase: 76 U/L (ref 38–126)
Anion gap: 7 (ref 5–15)
BUN: 19 mg/dL (ref 6–20)
CO2: 23 mmol/L (ref 22–32)
Calcium: 9.9 mg/dL (ref 8.9–10.3)
Chloride: 108 mmol/L (ref 98–111)
Creatinine, Ser: 1.24 mg/dL — ABNORMAL HIGH (ref 0.44–1.00)
GFR, Estimated: 52 mL/min — ABNORMAL LOW (ref >60)
Glucose, Bld: 106 mg/dL — ABNORMAL HIGH (ref 70–99)
Potassium: 4 mmol/L (ref 3.5–5.1)
Sodium: 138 mmol/L (ref 135–145)
Total Bilirubin: 0.3 mg/dL (ref 0.3–1.2)
Total Protein: 7.6 g/dL (ref 6.5–8.1)

## 2020-11-06 LAB — T4, FREE: Free T4: 0.74 ng/dL (ref 0.61–1.12)

## 2020-11-06 MED ORDER — SODIUM CHLORIDE 0.9% FLUSH
10.0000 mL | INTRAVENOUS | Status: DC | PRN
  Administered 2020-11-06: 10 mL via INTRAVENOUS

## 2020-11-06 MED ORDER — HEPARIN SOD (PORK) LOCK FLUSH 100 UNIT/ML IV SOLN
500.0000 [IU] | Freq: Once | INTRAVENOUS | Status: AC
  Administered 2020-11-06: 500 [IU] via INTRAVENOUS

## 2020-11-06 MED ORDER — ZOLEDRONIC ACID 4 MG/100ML IV SOLN
4.0000 mg | Freq: Once | INTRAVENOUS | Status: AC
  Administered 2020-11-06: 4 mg via INTRAVENOUS
  Filled 2020-11-06: qty 100

## 2020-11-06 MED ORDER — FULVESTRANT 250 MG/5ML IM SOLN
500.0000 mg | Freq: Once | INTRAMUSCULAR | Status: AC
  Administered 2020-11-06: 500 mg via INTRAMUSCULAR
  Filled 2020-11-06: qty 10

## 2020-11-06 MED ORDER — SODIUM CHLORIDE 0.9 % IV SOLN: INTRAVENOUS | Status: DC

## 2020-11-06 NOTE — Patient Instructions (Signed)
[facility]  Discharge Instructions: Thank you for choosing [facility] to provide your oncology and hematology care.   If you have a lab appointment with the [facility], please go directly to the [facility] and check in at the registration area.   Wear comfortable clothing and clothing appropriate for easy access to any Portacath or PICC line.   We strive to give you quality time with your provider. You may need to reschedule your appointment if you arrive late (15 or more minutes).  Arriving late affects you and other patients whose appointments are after yours.  Also, if you miss three or more appointments without notifying the office, you may be dismissed from the clinic at the provider's discretion.      For prescription refill requests, have your pharmacy contact our office and allow 72 hours for refills to be completed.    Today you received the following chemotherapy and/or immunotherapy agents : Faslodex, Zometa     To help prevent nausea and vomiting after your treatment, we encourage you to take your nausea medication as directed.  BELOW ARE SYMPTOMS THAT SHOULD BE REPORTED IMMEDIATELY: *FEVER GREATER THAN 100.4 F (38 C) OR HIGHER *CHILLS OR SWEATING *NAUSEA AND VOMITING THAT IS NOT CONTROLLED WITH YOUR NAUSEA MEDICATION *UNUSUAL SHORTNESS OF BREATH *UNUSUAL BRUISING OR BLEEDING *URINARY PROBLEMS (pain or burning when urinating, or frequent urination) *BOWEL PROBLEMS (unusual diarrhea, constipation, pain near the anus) TENDERNESS IN MOUTH AND THROAT WITH OR WITHOUT PRESENCE OF ULCERS (sore throat, sores in mouth, or a toothache) UNUSUAL RASH, SWELLING OR PAIN  UNUSUAL VAGINAL DISCHARGE OR ITCHING   Items with * indicate a potential emergency and should be followed up as soon as possible or go to the Emergency Department if any problems should occur.  Please show the CHEMOTHERAPY ALERT CARD or IMMUNOTHERAPY ALERT CARD at  check-in to the Emergency Department and triage nurse.  Should you have questions after your visit or need to cancel or reschedule your appointment, please contact [facility]  Dept: [phone]  and follow the prompts.  Office hours are 8:00 a.m. to 4:30 p.m. Monday - Friday. Please note that voicemails left after 4:00 p.m. may not be returned until the following business day.  We are closed weekends and major holidays. You have access to a nurse at all times for urgent questions. Please call the main number to the clinic Dept: [phone] and follow the prompts.   For any non-urgent questions, you may also contact your provider using MyChart. We now offer e-Visits for anyone 60 and older to request care online for non-urgent symptoms. For details visit [email or web address].   Also download the MyChart app! Go to the app store, search "MyChart", open the app, select [facility], and log in with your MyChart username and password.  Due to Covid, a mask is required upon entering the hospital/clinic. If you do not have a mask, one will be given to you upon arrival. For doctor visits, patients may have 1 support person aged 31 or older with them. For treatment visits, patients cannot have anyone with them due to current Covid guidelines and our immunocompromised population.

## 2020-11-18 ENCOUNTER — Other Ambulatory Visit: Payer: Self-pay | Admitting: Oncology

## 2020-11-26 ENCOUNTER — Other Ambulatory Visit: Payer: Self-pay | Admitting: Oncology

## 2020-11-27 ENCOUNTER — Encounter (HOSPITAL_BASED_OUTPATIENT_CLINIC_OR_DEPARTMENT_OTHER): Payer: Self-pay | Admitting: General Surgery

## 2020-11-27 ENCOUNTER — Other Ambulatory Visit: Payer: Self-pay

## 2020-12-01 ENCOUNTER — Other Ambulatory Visit: Payer: Self-pay | Admitting: General Surgery

## 2020-12-02 NOTE — Progress Notes (Signed)
[name]  [phone] [phone]    ID: [name] DOB: 12/11/1965  MR#: [id number]  QRF#:[id number]  Patient Care Team: [name], MD as PCP - General (Family Medicine) Mauro Kaufmann, RN as Oncology Nurse Navigator Rockwell Germany, RN as Oncology Nurse Navigator Rolm Bookbinder, MD as Consulting Physician (General Surgery) Bisma Klett, Virgie Dad, MD as Consulting Physician (Oncology) Kyung Rudd, MD as Consulting Physician (Radiation Oncology) Cheri Fowler, MD as Consulting Physician (Obstetrics and Gynecology) Elsie Saas, MD as Consulting Physician (Orthopedic Surgery) Juanita Craver, MD as Consulting Physician (Gastroenterology) Chauncey Cruel, MD OTHER MD:  CHIEF COMPLAINT: weakly estrogen receptor positive breast cancer  CURRENT TREATMENT: Fulvestrant, abemaciclib   INTERVAL HISTORY: [name] returns today for follow up and treatment of her 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    In light of recent progression on breast MRI, she was switched to fulvestrant on 10/08/20.  Her most recent recorded dose was 10/22/2020.  However she tells me she did receive a dose on 11/06/2020.  I am making sure that that is recorded today.  Of course she is due for dose today as well  She also began abemaciclib on 10/23/2020.  She is tolerating this remarkably well.  She will have between 1 and 3 slightly loose but still formed stools.  After her second stool she takes 2 Imodium and that generally takes care of the problem  She also receives zoledronate every 12 weeks, with her most recent dose 11/06/2020.  She tolerated that with no side effects that she is aware of.  She is scheduled for right mastectomy on 12/07/2020 under Dr. Donne Hazel.   REVIEW OF SYSTEMS: Amyla continues to work full-time.  She wonders if the restrictions that she is under (no lifting more than 20 pounds) can be lifted.  She is also wondering when she will be able to  get back to work after her surgery and if she is going to need physical therapy.  Aside from that a detailed review of systems today was remarkably benign   COVID 19 VACCINATION STATUS: fully vaccinated Therapist, music), with booster   HISTORY OF CURRENT ILLNESS: From the original intake note:  [name] had routine screening mammography showing a possible abnormality in the right breast and a prominent intramammary lymph node. She underwent right diagnostic mammography with tomography and right breast ultrasonography at [facility] on 03/26/2020 showing: breast density category B; palpable 1.8 cm right breast mass at 10 o'clock, which feels much larger than what is visualized sonographically; suspicious 0.6 cm intramammary lymph node in right breast at 10 o'clock; at least 3 morphologically abnormal lymph nodes in right axilla.  Accordingly on 03/27/2020 she proceeded to biopsy of the right breast areas in question. The pathology from this procedure ([id number]) showed:  1. Right Breast, 10 o'clock (mass)  - invasive mammary carcinoma, e-cadherin positive with a lobular growth pattern, grade 2  - mammary carcinoma in situ  - Prognostic indicators significant for: estrogen receptor, 1% positive and progesterone receptor, 1% positive, both with strong staining intensity. Proliferation marker Ki67 at 30%. HER2 negative by immunohistochemistry (0). 2. Right Breast, 10 o'clock (intramammary node)  - mammary carcinoma involving lymph node and perinodal soft tissue  -  Prognostic indicators significant for: estrogen receptor, 1% positive with strong staining intensity and progesterone receptor, 0% negative. Proliferation marker Ki67 at 40%. HER2 negative by immunohistochemistry (0).  Cancer Staging Malignant neoplasm of upper-outer quadrant of right breast in female,  estrogen receptor positive ([place] [place]) Staging form: Breast, AJCC 8th Edition - Pathologic stage from 03/31/2020: Stage IV (cM1) - Signed by  Gardenia Phlegm, NP on 05/01/2020 - Clinical stage from 04/08/2020: Stage IIB (cT1c, cN1(f), cM0, G2, ER-, PR-, HER2-) - Signed by Chauncey Cruel, MD on 04/08/2020 Stage prefix: Initial diagnosis Method of lymph node assessment: Core biopsy  The patient's subsequent history is as detailed below.   PAST MEDICAL HISTORY: Past Medical History:  Diagnosis Date   Allergy    Arthritis 2018   hands, left knee   Breast cancer ([facility]) 03-27-2020   right breast IMC   Eczema    Family history of breast cancer    Family history of lung cancer    Family history of prostate cancer    Hypertension     PAST SURGICAL HISTORY: Past Surgical History:  Procedure Laterality Date   ABDOMINAL HYSTERECTOMY     CESAREAN SECTION     KNEE ARTHROSCOPY W/ ACL RECONSTRUCTION Left    KNEE SURGERY     PORTACATH PLACEMENT Left 04/23/2020   Procedure: LEFT SIDE PORT PLACEMENT WITH ULTRASOUND GUIDANCE;  Surgeon: Rolm Bookbinder, MD;  Location: [facility];  Service: General;  Laterality: Left;  230PM START TIME PLEASE ROOM 2 THANKS   TUBAL LIGATION      FAMILY HISTORY: Family History  Problem Relation Age of Onset   Breast cancer Mother 43   Arthritis Mother    Diabetes Father    Kidney disease Father    Hypertension Sister    Heart disease Maternal Grandmother    Breast cancer Paternal Grandmother        dx >50   Heart disease Maternal Aunt    Breast cancer Maternal Aunt 77   Other Maternal Aunt        brain tumor (not cancerous)   Lung cancer Maternal Aunt    Prostate cancer Cousin 74       localized   Her parents are both living, her father age 65 and mother age 22, as of 03/2020. [name] has 1 brother and 2 sisters. She reports breast cancer in her mother at age 15, her maternal aunt, and her paternal grandmother. Her maternal aunt also has a history of lung cancer. She also reports prostate cancer in a maternal cousin in his 56's.   GYNECOLOGIC HISTORY:  No LMP  recorded. Patient has had a hysterectomy. Menarche: 55 years old Age at first live birth: 55 years old [place] P 2 LMP 2010 with hysterectomy Contraceptive: prior use without issue HRT never used  Hysterectomy? Yes, 2010 for fibroids BSO? 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 She is divorced. She lives at home with son [name], age 42, who works for [employer]. Son Thompson, Brooke Bonito, age 65, works as a Development worker, international aid in [place], [place]. Nuriya has no grandchildren. She attends [employer].    ADVANCED DIRECTIVES: not in place; considering naming mother Melody Haver, son [name], or son Raynald as healthcare powers of attorney   HEALTH MAINTENANCE: Social History   Tobacco Use   Smoking status: Former    Packs/day: 0.25    Years: 36.00    Pack years: 9.00    Types: Cigarettes    Quit date: 04/22/2017    Years since quitting: 3.6   Smokeless tobacco: Never   Tobacco comments:    She is trying to quit.  Substance Use Topics   Alcohol use: Yes    Alcohol/week: 3.0 standard drinks    Types: 3 Glasses of wine per week    Comment: Occasional    Drug use: Never     Colonoscopy: age 30  PAP: date unsure  Bone density: never done   No Known Allergies  Current Outpatient Medications  Medication Sig Dispense Refill   abemaciclib (VERZENIO) 150 MG tablet Take 1 tablet (150 mg total) by mouth 2 (two) times daily. Swallow tablets whole. Do not chew, crush, or split tablets before swallowing. Start 10/13/2020 56 tablet 6   acetaminophen (TYLENOL) 500 MG tablet Take 1,000 mg by mouth every 6 (six) hours as needed for moderate pain.     Boric Acid Vaginal 600 MG SUPP Place 600 mg vaginally daily as needed (yeast infections).     Cholecalciferol (VITAMIN D) 125 MCG (5000 UT) CAPS Take 5,000 Units by mouth daily.     clobetasol ointment (TEMOVATE) 7.03 % Apply 1 application topically 2 (two)  times daily as needed (rash/itching).     Cyanocobalamin (B-12) 5000 MCG CAPS Take 5,000 mcg by mouth daily.     loperamide (IMODIUM) 2 MG capsule Take 2 tablets after first diarrheal bowel movement, then one tablet after each following diarrheal movement; maximum 6 tablets/ day 60 capsule 6   prochlorperazine (COMPAZINE) 10 MG tablet Take 1 tablet (10 mg total) by mouth every 6 (six) hours as needed for nausea or vomiting. 30 tablet 4   propranolol (INDERAL) 10 MG tablet Take 10 mg by mouth 2 (two) times daily.     spironolactone (ALDACTONE) 25 MG tablet Take 1 tablet by mouth 2 (two) times daily.     No current facility-administered medications for this visit.    OBJECTIVE: African-American woman who appears well  Vitals:   12/03/20 0820  BP: (!) 156/87  Pulse: 90  Resp: 18  Temp: 97.7 F (36.5 C)  SpO2: 100%      Body mass index is 32.16 kg/m.   Wt Readings from Last 3 Encounters:  12/03/20 217 lb 12.8 oz (98.8 kg)  11/06/20 216 lb 0.8 oz (98 kg)  10/22/20 220 lb 6.4 oz (100 kg)     ECOG FS:1 - Symptomatic but completely ambulatory  Sclerae unicteric, EOMs intact Wearing a mask No cervical or supraclavicular adenopathy Lungs no rales or rhonchi Heart regular rate and rhythm Abd soft, nontender, positive bowel sounds MSK no focal spinal tenderness, no upper extremity lymphedema Neuro: nonfocal, well oriented, appropriate affect Breasts: There is palpable adenopathy in the right axilla.  I do not see any skin or nipple changes in the right breast of concern for inflammatory or T4 cancer.  Left breast and left axilla are benign   LAB RESULTS:  CMP     Component Value Date/Time   NA 138 11/06/2020 1403   K 4.0 11/06/2020 1403   CL 108 11/06/2020 1403   CO2 23 11/06/2020 1403   GLUCOSE 106 (H) 11/06/2020 1403   BUN 19 11/06/2020 1403   CREATININE 1.24 (H) 11/06/2020 1403   CREATININE 0.75 10/02/2020 1055   CREATININE 0.69 09/24/2013 0819   CALCIUM 9.9 11/06/2020  1403   PROT 7.6 11/06/2020 1403   ALBUMIN 3.8 11/06/2020 1403   AST 17 11/06/2020 1403   AST 20 10/02/2020 1055   ALT 13 11/06/2020 1403   ALT 16 10/02/2020 1055   ALKPHOS 76 11/06/2020 1403   BILITOT 0.3 11/06/2020 1403   BILITOT 0.4 10/02/2020  [place] (L) 11/06/2020 1403   GFRNONAA >60 10/02/2020 1055   GFRNONAA >89 09/24/2013 0819   GFRAA >89 09/24/2013 0819    No results found for: TOTALPROTELP, ALBUMINELP, A1GS, A2GS, BETS, BETA2SER, GAMS, MSPIKE, SPEI  Lab Results  Component Value Date   WBC 3.6 (L) 12/03/2020   NEUTROABS PENDING 12/03/2020   HGB 9.1 (L) 12/03/2020   HCT 28.4 (L) 12/03/2020   MCV 90.7 12/03/2020   PLT 212 12/03/2020    No results found for: LABCA2  No components found for: BLTJQZ009  No results for input(s): INR in the last 168 hours.  No results found for: LABCA2  No results found for: QZR007  No results found for: MAU633  No results found for: HLK562  No results found for: CA2729  No components found for: HGQUANT  No results found for: CEA1 / No results found for: CEA1   No results found for: AFPTUMOR  No results found for: CHROMOGRNA  No results found for: KPAFRELGTCHN, LAMBDASER, KAPLAMBRATIO (kappa/lambda light chains)  No results found for: HGBA, HGBA2QUANT, HGBFQUANT, HGBSQUAN (Hemoglobinopathy evaluation)   No results found for: LDH  No results found for: IRON, TIBC, IRONPCTSAT (Iron and TIBC)  No results found for: FERRITIN  Urinalysis No results found for: COLORURINE, APPEARANCEUR, LABSPEC, PHURINE, GLUCOSEU, HGBUR, BILIRUBINUR, KETONESUR, PROTEINUR, UROBILINOGEN, NITRITE, LEUKOCYTESUR   STUDIES: No results found.   ELIGIBLE FOR AVAILABLE RESEARCH PROTOCOL: no  ASSESSMENT: 55 y.o. [place] woman status post right breast upper outer quadrant biopsy 03/27/2020 for a clinical T1c N3 M1, stageIV invasive ductal carcinoma, functionally triple negative, with an MIB-1 of 30-40%  (a) chest CT scan  04/20/2020 shows in addition to the breast mass and regional adenopathy, lytic and sclerotic bone changes, possible left thoracic adenopathy, and indeterminant left pleural nodularity  (b) bone scan 04/21/2020 confirms multiple bone lesions  (c) thoracic MRI confirms diffuse bony metastatic disease but shows no epidural soft tissue component and no cord compression  (1) genetics testing 04/24/2020 through the [facility] RNA[facility] panel found no deleterious mutations in APC, ATM, AXIN2, BARD1, BMPR1A, BRCA1, BRCA2, BRIP1, CDH1, CDK4, CDKN2A, CHEK2, DICER1, EPCAM, GREM1, HOXB13, MEN1, MLH1, MSH2, MSH3, MSH6, MUTYH, NBN, NF1, NF2, NTHL1, PALB2, PMS2, POLD1, POLE, PTEN, RAD51C, RAD51D, RECQL, RET, SDHA, SDHAF2, SDHB, SDHC, SDHD, SMAD4, SMARCA4, STK11, TP53, TSC1, TSC2, and VHL.  RNA data is routinely analyzed for use in variant interpretation for all genes.  (2) neoadjuvant chemotherapy will consist of pembrolizumab every 3 weeks, carboplatin every 3 weeks x 4 with paclitaxel weekly x12, started 04/24/2020, completed 07/10/2020  (a) consider doxorubicin and cyclophosphamide every 3 weeks x 4 at disease progression  (3) zoledronate started 05/08/2018, repeated every 12 weeks  (4) definitive surgery being considered for local control  (5) PD-L1 and molecular studies on the 03/27/2020 biopsy requested 10/22/2020  (6) restaging studies:  (a) post-chemo breast MRI 06/30/2020 shows significant response  (b) CT chest 04/28 shows decreased right axillary adenopathy, no lung or liver lesions, healing of lytic bone lesions  (7) adjuvant pembrolizumab resumed 07/31/2020, continued to disease progression  (8) anastrozole started 07/26/2020 given (weak) estrogen and progesterone receptor positivity  (A) MRI breast 09/28/2020 shows local progression in the right breast and axilla-- anastrozole discontinued  (B) CT of the chest 10/08/2018 shows no lung or liver involvement, bone lesions as  before, incompletely imaged previously noted retroperitoneal adenopathy   (9) fulvestrant started 10/08/2020  (A) abemaciclib added 10/24/2018   PLAN: Siara started fulvestrant 10/08/2020.  She received her third every 2-week dose on 11/06/2020 and will receive her first monthly dose today.  I will receive a dose today.  She also was started on abemaciclib as 10/23/2020 and is tolerating that remarkably well.  She is scheduled for right modified radical mastectomy on 12/07/2020.  I am going to see her again in 4 weeks, by which time she should have recovered from her surgery.  Just before that visit she will have restaging studies to serve as a new baseline and to guide future therapy.  She is specifically interested on any work restrictions when she returns to work in exactly when she will be able to go back to work.  She will address these questions with Dr. Donne Hazel when he meets with her.  Total encounter time 25 minutes.Sarajane Jews C. Littleton Haub, MD 12/03/20 8:35 AM Medical Oncology and Hematology [facility] [place],  [address] Tel. [phone]    Fax. [phone]   I, Wilburn Mylar, am acting as scribe for Dr. Virgie Dad. Marque Rademaker.  I, Lurline Del MD, have reviewed the above documentation for accuracy and completeness, and I agree with the above.   *Total Encounter Time as defined by the Centers for Medicare and Medicaid Services includes, in addition to the face-to-face time of a patient visit (documented in the note above) non-face-to-face time: obtaining and reviewing outside history, ordering and reviewing medications, tests or procedures, care coordination (communications with other health care professionals or caregivers) and documentation in the medical record.

## 2020-12-03 ENCOUNTER — Inpatient Hospital Stay: Payer: BC Managed Care – PPO | Attending: Oncology | Admitting: Oncology

## 2020-12-03 ENCOUNTER — Other Ambulatory Visit: Payer: Self-pay

## 2020-12-03 ENCOUNTER — Inpatient Hospital Stay: Payer: BC Managed Care – PPO

## 2020-12-03 VITALS — BP 156/87 | HR 90 | Temp 97.7°F | Resp 18 | Wt 217.8 lb

## 2020-12-03 DIAGNOSIS — C7951 Secondary malignant neoplasm of bone: Secondary | ICD-10-CM

## 2020-12-03 DIAGNOSIS — Z5111 Encounter for antineoplastic chemotherapy: Secondary | ICD-10-CM | POA: Insufficient documentation

## 2020-12-03 DIAGNOSIS — Z87891 Personal history of nicotine dependence: Secondary | ICD-10-CM | POA: Insufficient documentation

## 2020-12-03 DIAGNOSIS — Z9221 Personal history of antineoplastic chemotherapy: Secondary | ICD-10-CM | POA: Insufficient documentation

## 2020-12-03 DIAGNOSIS — Z170 Estrogen receptor positive status [ER+]: Secondary | ICD-10-CM

## 2020-12-03 DIAGNOSIS — C50411 Malignant neoplasm of upper-outer quadrant of right female breast: Secondary | ICD-10-CM

## 2020-12-03 DIAGNOSIS — Z79899 Other long term (current) drug therapy: Secondary | ICD-10-CM | POA: Insufficient documentation

## 2020-12-03 DIAGNOSIS — Z171 Estrogen receptor negative status [ER-]: Secondary | ICD-10-CM | POA: Diagnosis not present

## 2020-12-03 LAB — COMPREHENSIVE METABOLIC PANEL
ALT: 11 U/L (ref 0–44)
AST: 18 U/L (ref 15–41)
Albumin: 3.8 g/dL (ref 3.5–5.0)
Alkaline Phosphatase: 69 U/L (ref 38–126)
Anion gap: 9 (ref 5–15)
BUN: 11 mg/dL (ref 6–20)
CO2: 23 mmol/L (ref 22–32)
Calcium: 9.8 mg/dL (ref 8.9–10.3)
Chloride: 108 mmol/L (ref 98–111)
Creatinine, Ser: 0.88 mg/dL (ref 0.44–1.00)
GFR, Estimated: >60 mL/min (ref >60)
Glucose, Bld: 81 mg/dL (ref 70–99)
Potassium: 3.7 mmol/L (ref 3.5–5.1)
Sodium: 140 mmol/L (ref 135–145)
Total Bilirubin: <0.2 mg/dL — ABNORMAL LOW (ref 0.3–1.2)
Total Protein: 7.7 g/dL (ref 6.5–8.1)

## 2020-12-03 LAB — CBC WITH DIFFERENTIAL/PLATELET
Abs Immature Granulocytes: 0.02 10*3/uL (ref 0.00–0.07)
Basophils Absolute: 0 10*3/uL (ref 0.0–0.1)
Basophils Relative: 1 %
Eosinophils Absolute: 0.1 10*3/uL (ref 0.0–0.5)
Eosinophils Relative: 1 %
HCT: 28.4 % — ABNORMAL LOW (ref 36.0–46.0)
Hemoglobin: 9.1 g/dL — ABNORMAL LOW (ref 12.0–15.0)
Immature Granulocytes: 1 %
Lymphocytes Relative: 54 %
Lymphs Abs: 2 10*3/uL (ref 0.7–4.0)
MCH: 29.1 pg (ref 26.0–34.0)
MCHC: 32 g/dL (ref 30.0–36.0)
MCV: 90.7 fL (ref 80.0–100.0)
Monocytes Absolute: 0.4 10*3/uL (ref 0.1–1.0)
Monocytes Relative: 11 %
Neutro Abs: 1.2 10*3/uL — ABNORMAL LOW (ref 1.7–7.7)
Neutrophils Relative %: 32 %
Platelets: 212 10*3/uL (ref 150–400)
RBC: 3.13 MIL/uL — ABNORMAL LOW (ref 3.87–5.11)
RDW: 14.5 % (ref 11.5–15.5)
WBC: 3.6 10*3/uL — ABNORMAL LOW (ref 4.0–10.5)
nRBC: 0 % (ref 0.0–0.2)

## 2020-12-03 LAB — T4, FREE: Free T4: 0.84 ng/dL (ref 0.61–1.12)

## 2020-12-03 MED ORDER — FULVESTRANT 250 MG/5ML IM SOLN
500.0000 mg | Freq: Once | INTRAMUSCULAR | Status: AC
  Administered 2020-12-03: 500 mg via INTRAMUSCULAR
  Filled 2020-12-03: qty 10

## 2020-12-07 ENCOUNTER — Other Ambulatory Visit: Payer: Self-pay

## 2020-12-07 ENCOUNTER — Ambulatory Visit (HOSPITAL_BASED_OUTPATIENT_CLINIC_OR_DEPARTMENT_OTHER): Payer: BC Managed Care – PPO | Admitting: Certified Registered"

## 2020-12-07 ENCOUNTER — Encounter (HOSPITAL_BASED_OUTPATIENT_CLINIC_OR_DEPARTMENT_OTHER)
Admission: RE | Disposition: A | Discharge status: Home / Self Care | Payer: Self-pay | Source: Home / Self Care | Attending: General Surgery

## 2020-12-07 ENCOUNTER — Encounter (HOSPITAL_BASED_OUTPATIENT_CLINIC_OR_DEPARTMENT_OTHER): Payer: Self-pay | Admitting: General Surgery

## 2020-12-07 ENCOUNTER — Observation Stay (HOSPITAL_BASED_OUTPATIENT_CLINIC_OR_DEPARTMENT_OTHER)
Admission: RE | Admit: 2020-12-07 | Discharge: 2020-12-08 | Disposition: A | Discharge status: Home / Self Care | Payer: BC Managed Care – PPO | Attending: General Surgery | Admitting: General Surgery

## 2020-12-07 DIAGNOSIS — Z87891 Personal history of nicotine dependence: Secondary | ICD-10-CM | POA: Insufficient documentation

## 2020-12-07 DIAGNOSIS — Z79899 Other long term (current) drug therapy: Secondary | ICD-10-CM | POA: Insufficient documentation

## 2020-12-07 DIAGNOSIS — C50911 Malignant neoplasm of unspecified site of right female breast: Principal | ICD-10-CM | POA: Insufficient documentation

## 2020-12-07 DIAGNOSIS — I10 Essential (primary) hypertension: Secondary | ICD-10-CM | POA: Insufficient documentation

## 2020-12-07 DIAGNOSIS — Z9011 Acquired absence of right breast and nipple: Secondary | ICD-10-CM

## 2020-12-07 HISTORY — PX: MODIFIED MASTECTOMY: SHX5268

## 2020-12-07 SURGERY — MODIFIED MASTECTOMY
Anesthesia: General | Site: Breast | Laterality: Right

## 2020-12-07 MED ORDER — MORPHINE SULFATE (PF) 4 MG/ML IV SOLN
2.0000 mg | INTRAVENOUS | Status: DC | PRN
Start: 2020-12-07 — End: 2020-12-08

## 2020-12-07 MED ORDER — FENTANYL CITRATE (PF) 100 MCG/2ML IJ SOLN
INTRAMUSCULAR | Status: AC
  Filled 2020-12-07: qty 2

## 2020-12-07 MED ORDER — OXYCODONE HCL 5 MG PO TABS
5.0000 mg | ORAL_TABLET | Freq: Once | ORAL | Status: DC | PRN
Start: 2020-12-07 — End: 2020-12-07

## 2020-12-07 MED ORDER — FENTANYL CITRATE (PF) 100 MCG/2ML IJ SOLN
INTRAMUSCULAR | Status: DC | PRN
  Administered 2020-12-07 (×4): 25 ug via INTRAVENOUS

## 2020-12-07 MED ORDER — OXYCODONE HCL 5 MG PO TABS
5.0000 mg | ORAL_TABLET | ORAL | Status: DC | PRN
  Administered 2020-12-07 (×2): 10 mg via ORAL
  Filled 2020-12-07 (×2): qty 2

## 2020-12-07 MED ORDER — 0.9 % SODIUM CHLORIDE (POUR BTL) OPTIME
TOPICAL | Status: DC | PRN
  Administered 2020-12-07: 500 mL

## 2020-12-07 MED ORDER — CHLORHEXIDINE GLUCONATE CLOTH 2 % EX PADS: 6.0000 | MEDICATED_PAD | Freq: Once | CUTANEOUS | Status: DC

## 2020-12-07 MED ORDER — MIDAZOLAM HCL 2 MG/2ML IJ SOLN
INTRAMUSCULAR | Status: AC
  Filled 2020-12-07: qty 2

## 2020-12-07 MED ORDER — DROPERIDOL 2.5 MG/ML IJ SOLN: 0.6250 mg | Freq: Once | INTRAMUSCULAR | Status: DC | PRN

## 2020-12-07 MED ORDER — FENTANYL CITRATE (PF) 100 MCG/2ML IJ SOLN
25.0000 ug | INTRAMUSCULAR | Status: DC | PRN
  Administered 2020-12-07: 50 ug via INTRAVENOUS

## 2020-12-07 MED ORDER — LIDOCAINE HCL (CARDIAC) PF 100 MG/5ML IV SOSY
PREFILLED_SYRINGE | INTRAVENOUS | Status: DC | PRN
  Administered 2020-12-07: 100 mg via INTRAVENOUS

## 2020-12-07 MED ORDER — DEXAMETHASONE SODIUM PHOSPHATE 10 MG/ML IJ SOLN
INTRAMUSCULAR | Status: DC | PRN
  Administered 2020-12-07: 5 mg via INTRAVENOUS

## 2020-12-07 MED ORDER — ENSURE PRE-SURGERY PO LIQD: 296.0000 mL | Freq: Once | ORAL | Status: DC

## 2020-12-07 MED ORDER — ONDANSETRON HCL 4 MG/2ML IJ SOLN
INTRAMUSCULAR | Status: AC
  Filled 2020-12-07: qty 2

## 2020-12-07 MED ORDER — SPIRONOLACTONE 25 MG PO TABS
25.0000 mg | ORAL_TABLET | Freq: Two times a day (BID) | ORAL | Status: DC
  Filled 2020-12-07 (×4): qty 1

## 2020-12-07 MED ORDER — LACTATED RINGERS IV SOLN: INTRAVENOUS | Status: DC

## 2020-12-07 MED ORDER — ONDANSETRON HCL 4 MG/2ML IJ SOLN: 4.0000 mg | Freq: Four times a day (QID) | INTRAMUSCULAR | Status: DC | PRN

## 2020-12-07 MED ORDER — ONDANSETRON HCL 4 MG/2ML IJ SOLN
INTRAMUSCULAR | Status: DC | PRN
  Administered 2020-12-07: 4 mg via INTRAVENOUS

## 2020-12-07 MED ORDER — PROPOFOL 10 MG/ML IV BOLUS
INTRAVENOUS | Status: AC
  Filled 2020-12-07: qty 20

## 2020-12-07 MED ORDER — ONDANSETRON 4 MG PO TBDP: 4.0000 mg | ORAL_TABLET | Freq: Four times a day (QID) | ORAL | Status: DC | PRN

## 2020-12-07 MED ORDER — ACETAMINOPHEN 500 MG PO TABS
1000.0000 mg | ORAL_TABLET | Freq: Four times a day (QID) | ORAL | Status: DC
  Administered 2020-12-07 – 2020-12-08 (×3): 1000 mg via ORAL
  Filled 2020-12-07 (×3): qty 2

## 2020-12-07 MED ORDER — PROPOFOL 10 MG/ML IV BOLUS
INTRAVENOUS | Status: DC | PRN
  Administered 2020-12-07: 150 mg via INTRAVENOUS
  Administered 2020-12-07: 100 mg via INTRAVENOUS
  Administered 2020-12-07: 50 mg via INTRAVENOUS

## 2020-12-07 MED ORDER — MIDAZOLAM HCL 2 MG/2ML IJ SOLN
2.0000 mg | Freq: Once | INTRAMUSCULAR | Status: AC
  Administered 2020-12-07: 2 mg via INTRAVENOUS

## 2020-12-07 MED ORDER — PROMETHAZINE HCL 25 MG/ML IJ SOLN: 6.2500 mg | INTRAMUSCULAR | Status: DC | PRN

## 2020-12-07 MED ORDER — LIDOCAINE 2% (20 MG/ML) 5 ML SYRINGE
INTRAMUSCULAR | Status: AC
  Filled 2020-12-07: qty 5

## 2020-12-07 MED ORDER — FENTANYL CITRATE (PF) 100 MCG/2ML IJ SOLN
100.0000 ug | Freq: Once | INTRAMUSCULAR | Status: AC
  Administered 2020-12-07: 50 ug via INTRAVENOUS

## 2020-12-07 MED ORDER — BUPIVACAINE LIPOSOME 1.3 % IJ SUSP
INTRAMUSCULAR | Status: DC | PRN
  Administered 2020-12-07: 10 mL

## 2020-12-07 MED ORDER — ACETAMINOPHEN 500 MG PO TABS
ORAL_TABLET | ORAL | Status: AC
  Filled 2020-12-07: qty 2

## 2020-12-07 MED ORDER — ACETAMINOPHEN 500 MG PO TABS
1000.0000 mg | ORAL_TABLET | ORAL | Status: AC
  Administered 2020-12-07: 1000 mg via ORAL

## 2020-12-07 MED ORDER — METHOCARBAMOL 500 MG PO TABS
500.0000 mg | ORAL_TABLET | Freq: Four times a day (QID) | ORAL | Status: DC | PRN
  Administered 2020-12-07: 500 mg via ORAL
  Filled 2020-12-07: qty 1

## 2020-12-07 MED ORDER — EPHEDRINE SULFATE 50 MG/ML IJ SOLN: INTRAMUSCULAR | Status: DC | PRN

## 2020-12-07 MED ORDER — CHLORHEXIDINE GLUCONATE CLOTH 2 % EX PADS
6.0000 | MEDICATED_PAD | Freq: Once | CUTANEOUS | Status: DC
Start: 2020-12-07 — End: 2020-12-07

## 2020-12-07 MED ORDER — BUPIVACAINE HCL (PF) 0.5 % IJ SOLN
INTRAMUSCULAR | Status: DC | PRN
  Administered 2020-12-07: 20 mL

## 2020-12-07 MED ORDER — OXYCODONE HCL 5 MG/5ML PO SOLN: 5.0000 mg | Freq: Once | ORAL | Status: DC | PRN

## 2020-12-07 MED ORDER — CEFAZOLIN SODIUM-DEXTROSE 2-4 GM/100ML-% IV SOLN
INTRAVENOUS | Status: AC
  Filled 2020-12-07: qty 100

## 2020-12-07 MED ORDER — PROPRANOLOL HCL 10 MG PO TABS
10.0000 mg | ORAL_TABLET | Freq: Two times a day (BID) | ORAL | Status: DC
  Filled 2020-12-07 (×4): qty 1

## 2020-12-07 MED ORDER — CEFAZOLIN SODIUM-DEXTROSE 2-4 GM/100ML-% IV SOLN
2.0000 g | INTRAVENOUS | Status: AC
  Administered 2020-12-07: 2 g via INTRAVENOUS

## 2020-12-07 MED ORDER — SODIUM CHLORIDE 0.9 % IV SOLN: INTRAVENOUS | Status: DC

## 2020-12-07 MED ORDER — DEXAMETHASONE SODIUM PHOSPHATE 10 MG/ML IJ SOLN
INTRAMUSCULAR | Status: AC
  Filled 2020-12-07: qty 1

## 2020-12-07 SURGICAL SUPPLY — 59 items
APPLIER CLIP 11 MED OPEN (CLIP) ×6
APPLIER CLIP 9.375 MED OPEN (MISCELLANEOUS)
BINDER BREAST LRG (GAUZE/BANDAGES/DRESSINGS) IMPLANT
BINDER BREAST MEDIUM (GAUZE/BANDAGES/DRESSINGS) IMPLANT
BINDER BREAST XLRG (GAUZE/BANDAGES/DRESSINGS) IMPLANT
BINDER BREAST XXLRG (GAUZE/BANDAGES/DRESSINGS) IMPLANT
BIOPATCH RED 1 DISK 7.0 (GAUZE/BANDAGES/DRESSINGS) ×4 IMPLANT
BLADE CLIPPER SURG (BLADE) IMPLANT
BLADE SURG 10 STRL SS (BLADE) ×2 IMPLANT
BLADE SURG 15 STRL LF DISP TIS (BLADE) ×1 IMPLANT
BLADE SURG 15 STRL SS (BLADE) ×2
CANISTER SUCT 1200ML W/VALVE (MISCELLANEOUS) ×2 IMPLANT
CHLORAPREP W/TINT 26 (MISCELLANEOUS) ×2 IMPLANT
CLIP APPLIE 11 MED OPEN (CLIP) ×3 IMPLANT
CLIP APPLIE 9.375 MED OPEN (MISCELLANEOUS) IMPLANT
CLIP TI WIDE RED SMALL 6 (CLIP) IMPLANT
COVER BACK TABLE 60X90IN (DRAPES) ×2 IMPLANT
COVER MAYO STAND STRL (DRAPES) ×2 IMPLANT
DECANTER SPIKE VIAL GLASS SM (MISCELLANEOUS) ×2 IMPLANT
DERMABOND ADVANCED (GAUZE/BANDAGES/DRESSINGS) ×2
DERMABOND ADVANCED .7 DNX12 (GAUZE/BANDAGES/DRESSINGS) ×2 IMPLANT
DEVICE DISSECT PLASMABLAD 3.0S (MISCELLANEOUS) IMPLANT
DRAIN CHANNEL 19F RND (DRAIN) ×4 IMPLANT
DRAPE TOP ARMCOVERS (MISCELLANEOUS) ×2 IMPLANT
DRAPE U-SHAPE 76X120 STRL (DRAPES) ×2 IMPLANT
DRSG PAD ABDOMINAL 8X10 ST (GAUZE/BANDAGES/DRESSINGS) ×4 IMPLANT
DRSG TEGADERM 4X4.75 (GAUZE/BANDAGES/DRESSINGS) ×4 IMPLANT
ELECT REM PT RETURN 9FT ADLT (ELECTROSURGICAL) ×2
ELECTRODE REM PT RTRN 9FT ADLT (ELECTROSURGICAL) ×1 IMPLANT
EVACUATOR SILICONE 100CC (DRAIN) ×4 IMPLANT
GAUZE SPONGE 4X4 12PLY STRL (GAUZE/BANDAGES/DRESSINGS) ×2 IMPLANT
GLOVE SURG ENC MOIS LTX SZ7 (GLOVE) ×6 IMPLANT
GOWN STRL REUS W/ TWL LRG LVL3 (GOWN DISPOSABLE) ×3 IMPLANT
GOWN STRL REUS W/TWL LRG LVL3 (GOWN DISPOSABLE) ×6
HEMOSTAT ARISTA ABSORB 1G (HEMOSTASIS) ×6 IMPLANT
HEMOSTAT SNOW SURGICEL 2X4 (HEMOSTASIS) ×2 IMPLANT
NEEDLE HYPO 25X1 1.5 SAFETY (NEEDLE) IMPLANT
NS IRRIG 1000ML POUR BTL (IV SOLUTION) ×2 IMPLANT
PACK BASIN DAY SURGERY FS (CUSTOM PROCEDURE TRAY) ×2 IMPLANT
PENCIL SMOKE EVACUATOR (MISCELLANEOUS) ×2 IMPLANT
PIN SAFETY STERILE (MISCELLANEOUS) ×2 IMPLANT
PLASMABLADE 3.0S (MISCELLANEOUS)
RETRACTOR ONETRAX LX 90X20 (MISCELLANEOUS) IMPLANT
SHEET MEDIUM DRAPE 40X70 STRL (DRAPES) IMPLANT
SLEEVE SCD COMPRESS KNEE MED (STOCKING) ×2 IMPLANT
SPONGE T-LAP 18X18 ~~LOC~~+RFID (SPONGE) ×6 IMPLANT
SPONGE T-LAP 4X18 ~~LOC~~+RFID (SPONGE) IMPLANT
STOCKINETTE IMPERVIOUS LG (DRAPES) IMPLANT
STRIP CLOSURE SKIN 1/2X4 (GAUZE/BANDAGES/DRESSINGS) ×4 IMPLANT
SUT ETHILON 2 0 FS 18 (SUTURE) ×4 IMPLANT
SUT MNCRL AB 4-0 PS2 18 (SUTURE) ×4 IMPLANT
SUT SILK 2 0 SH (SUTURE) ×2 IMPLANT
SUT VIC AB 3-0 SH 27 (SUTURE) ×2
SUT VIC AB 3-0 SH 27X BRD (SUTURE) ×1 IMPLANT
SUT VICRYL 3-0 CR8 SH (SUTURE) ×4 IMPLANT
SYR CONTROL 10ML LL (SYRINGE) IMPLANT
TOWEL GREEN STERILE FF (TOWEL DISPOSABLE) ×4 IMPLANT
TUBE CONNECTING 20X1/4 (TUBING) ×2 IMPLANT
YANKAUER SUCT BULB TIP NO VENT (SUCTIONS) ×2 IMPLANT

## 2020-12-07 NOTE — Anesthesia Procedure Notes (Signed)
Procedure Name: LMA Insertion Date/Time: 12/07/2020 12:35 PM Performed by: Lavonia Dana, CRNA Pre-anesthesia Checklist: Patient identified, Emergency Drugs available, Suction available and Patient being monitored Patient Re-evaluated:Patient Re-evaluated prior to induction Oxygen Delivery Method: Circle system utilized Preoxygenation: Pre-oxygenation with 100% oxygen Induction Type: IV induction Ventilation: Mask ventilation without difficulty LMA: LMA inserted LMA Size: 4.0 Number of attempts: 1 Airway Equipment and Method: Bite block Placement Confirmation: positive ETCO2 Tube secured with: Tape Dental Injury: Teeth and Oropharynx as per pre-operative assessment

## 2020-12-07 NOTE — Discharge Instructions (Addendum)
Post Anesthesia Home Care Instructions  Activity: Get plenty of rest for the remainder of the day. A responsible individual must stay with you for 24 hours following the procedure.  For the next 24 hours, DO NOT: -Drive a car -Paediatric nurse -Drink alcoholic beverages -Take any medication unless instructed by your physician -Make any legal decisions or sign important papers.  Meals: Start with liquid foods such as gelatin or soup. Progress to regular foods as tolerated. Avoid greasy, spicy, heavy foods. If nausea and/or vomiting occur, drink only clear liquids until the nausea and/or vomiting subsides. Call your physician if vomiting continues.  Special Instructions/Symptoms: Your throat may feel dry or sore from the anesthesia or the breathing tube placed in your throat during surgery. If this causes discomfort, gargle with warm salt water. The discomfort should disappear within 24 hours.   Information for Discharge Teaching: EXPAREL (bupivacaine liposome injectable suspension)   Your surgeon or anesthesiologist gave you EXPAREL(bupivacaine) to help control your pain after surgery.  EXPAREL is a local anesthetic that provides pain relief by numbing the tissue around the surgical site. EXPAREL is designed to release pain medication over time and can control pain for up to 72 hours. Depending on how you respond to EXPAREL, you may require less pain medication during your recovery.  Possible side effects: Temporary loss of sensation or ability to move in the area where bupivacaine was injected. Nausea, vomiting, constipation Rarely, numbness and tingling in your mouth or lips, lightheadedness, or anxiety may occur. Call your doctor right away if you think you may be experiencing any of these sensations, or if you have other questions regarding possible side effects.  Follow all other discharge instructions given to you by your surgeon or nurse. Eat a healthy diet and drink plenty of  water or other fluids.  If you return to the hospital for any reason within 96 hours following the administration of EXPAREL, it is important for health care providers to know that you have received this anesthetic. A teal colored band has been placed on your arm with the date, time and amount of EXPAREL you have received in order to alert and inform your health care providers. Please leave this armband in place for the full 96 hours following administration, and then you may remove the band.       [facility], [place] [phone]  MASTECTOMY: POST OP INSTRUCTIONS Take 400 mg of ibuprofen every 8 hours or 650 mg tylenol every 6 hours for next 72 hours then as needed. Use ice several times daily also. Always review your discharge instruction sheet given to you by the facility where your surgery was performed. IF YOU HAVE DISABILITY OR FAMILY LEAVE FORMS, YOU MUST BRING THEM TO THE OFFICE FOR PROCESSING.   DO NOT GIVE THEM TO YOUR DOCTOR. A prescription for pain medication may be given to you upon discharge.  Take your pain medication as prescribed, if needed.  If narcotic pain medicine is not needed, then you may take acetaminophen (Tylenol), naprosyn (Alleve) or ibuprofen (Advil) as needed. Take your usually prescribed medications unless otherwise directed. If you need a refill on your pain medication, please contact your pharmacy.  They will contact our office to request authorization.  Prescriptions will not be filled after 5pm or on week-ends. You should follow a light diet the first few days after arrival home, such as soup and crackers, etc.  Resume your normal diet the day after surgery. Most patients will experience some  swelling and bruising on the chest and underarm.  Ice packs will help.  Swelling and bruising can take several days to resolve. Wear the binder day and night until you return to the office.  It is common to experience some constipation if taking pain medication  after surgery.  Increasing fluid intake and taking a stool softener (such as Colace) will usually help or prevent this problem from occurring.  A mild laxative (Milk of Magnesia or Miralax) should be taken according to package instructions if there are no bowel movements after 48 hours. Unless discharge instructions indicate otherwise, leave your bandage dry and in place until your next appointment in 3-5 days.  You may take a limited sponge bath.  No tube baths or showers until the drains are removed.  You may have steri-strips (small skin tapes) in place directly over the incision.  These strips should be left on the skin for 7-10 days. If you have glue it will come off in next couple week.  Any sutures will be removed at an office visit DRAINS:  If you have drains in place, it is important to keep a list of the amount of drainage produced each day in your drains.  Before leaving the hospital, you should be instructed on drain care.  Call our office if you have any questions about your drains. I will remove your drains when they put out less than 30 cc or ml for 2 consecutive days. ACTIVITIES:  You may resume regular (light) daily activities beginning the next day--such as daily self-care, walking, climbing stairs--gradually increasing activities as tolerated.  You may have sexual intercourse when it is comfortable.  Refrain from any heavy lifting or straining until approved by your doctor. You may drive when you are no longer taking prescription pain medication, you can comfortably wear a seatbelt, and you can safely maneuver your car and apply brakes. RETURN TO WORK:  __________________________________________________________ Dennis Bast should see your doctor in the office for a follow-up appointment approximately 3-5 days after your surgery.  Your doctor's nurse will typically make your follow-up appointment when she calls you with your pathology report.  Expect your pathology report 3-4business days after  surgery. OTHER INSTRUCTIONS: ______________________________________________________________________________________________ ____________________________________________________________________________________________ WHEN TO CALL YOUR DR [place]: Fever over 101.0 Nausea and/or vomiting Extreme swelling or bruising Continued bleeding from incision. Increased pain, redness, or drainage from the incision. The clinic staff is available to answer your questions during regular business hours.  Please don't hesitate to call and ask to speak to one of the nurses for clinical concerns.  If you have a medical emergency, go to the nearest emergency room or call 911.  A surgeon from [facility] is always on call at the hospital. [address] [place], [place], [place]  [address] ? [address], [place], [place]   [address] [phone] ? [phone] ? FAX [phone] Web site: [email or web address]   About my Jackson-Pratt Bulb Drain  What is a Jackson-Pratt bulb? A Jackson-Pratt is a soft, round device used to collect drainage. It is connected to a long, thin drainage catheter, which is held in place by one or two small stiches near your surgical incision site. When the bulb is squeezed, it forms a vacuum, forcing the drainage to empty into the bulb.  Emptying the Jackson-Pratt bulb- To empty the bulb: 1. Release the plug on the top of the bulb. 2. Pour the bulb's contents into a measuring container which your nurse will provide. 3. Record  the time emptied and amount of drainage. Empty the drain(s) as often as your     doctor or nurse recommends.  Date                  Time                    Amount (Drain 1)                 Amount (Drain  2)  _____________________________________________________________________  _____________________________________________________________________  _____________________________________________________________________  _____________________________________________________________________  _____________________________________________________________________  _____________________________________________________________________  _____________________________________________________________________  _____________________________________________________________________  Squeezing the Jackson-Pratt Bulb- To squeeze the bulb: 1. Make sure the plug at the top of the bulb is open. 2. Squeeze the bulb tightly in your fist. You will hear air squeezing from the bulb. 3. Replace the plug while the bulb is squeezed. 4. Use a safety pin to attach the bulb to your clothing. This will keep the catheter from     pulling at the bulb insertion site.  When to call your doctor- Call your doctor if: Drain site becomes red, swollen or hot. You have a fever greater than 101 degrees F. There is oozing at the drain site. Drain falls out (apply a guaze bandage over the drain hole and secure it with tape). Drainage increases daily not related to activity patterns. (You will usually have more drainage when you are active than when you are resting.) Drainage has a bad odor.

## 2020-12-07 NOTE — Anesthesia Procedure Notes (Signed)
Anesthesia Regional Block: Pectoralis block   Pre-Anesthetic Checklist: , timeout performed,  Correct Patient, Correct Site, Correct Laterality,  Correct Procedure, Correct Position, site marked,  Risks and benefits discussed,  Surgical consent,  Pre-op evaluation,  At surgeon's request and post-op pain management  Laterality: Right  Prep: chloraprep       Needles:  Injection technique: Single-shot  Needle Type: Echogenic Stimulator Needle     Needle Length: 10cm  Needle Gauge: 20     Additional Needles:   Procedures:,,,, ultrasound used (permanent image in chart),,    Narrative:  Start time: 12/07/2020 11:10 AM End time: 12/07/2020 11:15 AM Injection made incrementally with aspirations every 5 mL.  Performed by: Personally  Anesthesiologist: Merlinda Frederick, MD  Additional Notes: A functioning IV was confirmed and monitors were applied.  Sterile prep and drape, hand hygiene and sterile gloves were used.  Negative aspiration and test dose prior to incremental administration of local anesthetic. The patient tolerated the procedure well.Ultrasound  guidance: relevant anatomy identified, needle position confirmed, local anesthetic spread visualized around nerve(s), vascular puncture avoided.  Image printed for medical record.

## 2020-12-07 NOTE — Op Note (Signed)
Preoperative diagnosis: Stage IV breast cancer Postoperative diagnosis: Same as above Procedure: Right modified radical mastectomy Surgeon: Dr. Serita Grammes Anesthesia: General with a pectoral block Estimated blood loss: 75 cc Complications: None Drains: [address] drains Specimens: Right breast and axillary contents with short superior, long marks axillary contents Special count was correct at completion Incision recovery stable condition  Indications: This a 55 year old female who has been followed for stage IV breast cancer.  She has undergone chemotherapy and by imaging she has a very good response.  Due to stage IV disease we elected to follow her at that time.  She then noted a right axillary mass on her own exam.  She underwent imaging which showed increase in her axillary lymph nodes.  Due to this we discussed all of her options in a multidisciplinary fashion and elected proceed with a right modified radical mastectomy for local control as the local regional disease was progressive but her stage IV disease appears to be stable.  Procedure: After informed consent was obtained the patient first underwent a pectoral block.  She was given antibiotics.  SCDs were in place.  She was placed under general anesthesia without complication.  She was prepped and draped in the standard sterile surgical fashion.  A surgical timeout was then performed.  I made a reduction pattern incision.  I did this to remove the skin and try to keep the lateral portion as flat as possible.  I then created flaps to the sternum, clavicle, inframammary fold, and latissimus laterally.  The breast was very adherent to the pectoralis I did remove the fascia along with this.  I then rolled this laterally and entered the axilla.  She had a lot of very bulky disease that stretched up to level 1 in her axilla.  With some difficulty I eventually able this was able to free this from the axillary vein.  I used numerous clips  as well as sutures to tie off all these little veins and draining lymphatics.  I then was able to identify the thoracodorsal bundle as well as the long thoracic nerve and preserve these.  I did go up underneath her pectoralis muscle and remove all the palpable nodes that were present.  I then swept this caudad.  I remove this along with the breast.  There were a couple of small veins that were right off the axillary vein that were bleeding that I treated with clips.  I passed the specimen off the table.  I did obtain hemostasis.  I placed [place] drains.  I did place a piece of Surg[place] snow overlying the raw area right on the axillary vein.  I then closed the incision with 3-0 Vicryl.  The skin was closed with 4 Monocryl.  Glue Steri-Strips were applied.  She tolerated this well was extubated transferred recovery stable.

## 2020-12-07 NOTE — Progress Notes (Signed)
Assisted Dr. Elgie Congo with right, ultrasound guided, pectoralis block. Side rails up, monitors on throughout procedure. See vital signs in flow sheet. Tolerated Procedure well.

## 2020-12-07 NOTE — H&P (Signed)
55 y.o. female who is seen today for follow up for stage IV breast cancer She noted right breast mass in November, no nipple dc noted.  she had mm that showed a right breast distortion in uoq.  dx views and mm showed a 1.8x1.7x1.7 cm mass with a 6 mm IM node as well. there are at least 3 abnormal ax nodes present. biopsy of all 3 were done. the clips from breast and im node are 3.3 cm apart under compression.  the ax node is positive. 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  Ki between 30-40%.  genetics are negative.  she underwent staging that shows diffuse bony mets.  she has undergone chemotherapy. post chemo shows healing of lytic bone lesions on ct, breast shows significant response. We elected at that time to follow her. She underwent reevaluation with imaging after she palpated a right axillary mass. The right breast mass measured 1.4 cm now measures 1.9 cm. There is non-mass-like patchy enhancement that is more prominent as well. The right axillary adenopathy including the retropectoral nodes of increase in size and the largest measures 3.1 cm there are at least 8 enlarged lymph nodes right now. Her bony disease is doing well. She does have some upper abdominal retroperitoneal adenopathy present. We discussed her in conference and she returns today to discuss surgery for her stage IV disease.  Review of Systems: A complete review of systems was obtained from the patient. I have reviewed this information and discussed as appropriate with the patient. See HPI as well for other ROS.  Review of Systems  All other systems reviewed and are negative.   Medical History: Past Medical History:  Diagnosis Date   History of cancer   Hypertension   There is no problem list on file for this patient.  Past Surgical History:  Procedure Laterality Date   CESAREAN SECTION N/A    No Known Allergies  Current Outpatient Medications on File  Prior to Visit  Medication Sig Dispense Refill   loperamide (IMODIUM) 2 mg capsule Take 2 tablets after first diarrheal bowel movement, then one tablet after each following diarrheal movement; maximum 6 tablets/ day   spironolactone (ALDACTONE) 25 MG tablet Take 1 tablet by mouth 2 (two) times daily   VERZENIO 150 mg tablet   No current facility-administered medications on file prior to visit.   Family History  Problem Relation Age of Onset   High blood pressure (Hypertension) Mother   Breast cancer Mother   High blood pressure (Hypertension) Sister    Social History   Tobacco Use  Smoking Status Former Smoker  Smokeless Tobacco Never Used    Social History   Socioeconomic History   Marital status: Divorced  Tobacco Use   Smoking status: Former Smoker   Smokeless tobacco: Never Used  Scientific laboratory technician Use: Unknown  Substance and Sexual Activity   Alcohol use: Never   Drug use: Never   Objective:   There were no vitals filed for this visit.  There is no height or weight on file to calculate BMI.  Physical Exam Constitutional:  Appearance: Normal appearance.  Cardiovascular:  Rate and Rhythm: Normal rate.  Pulmonary:  Effort: Pulmonary effort is normal.  Chest:  Breasts:  Right: No mass or nipple discharge.  Left: No mass or nipple discharge.   Lymphadenopathy:  Upper Body:  Right upper body: Axillary adenopathy (3 cm palpable axillary  node, mobile) present. No supraclavicular adenopathy.  Left upper body: No supraclavicular or axillary adenopathy.  Neurological:  Mental Status: She is alert.     Assessment and Plan:  Stage IV breast cancer with interval progression of local regional disease  Right modified radical mastectomy  We had avoided surgery but unfortunately I think she is the 1 in 6 with progression of disease that is going to require surgery. She is doing fairly well overall with her treatment of her stage IV disease. This has progressed to  the palpable disease at this point though. I do think it is going to require a mastectomy and an axillary dissection to remove all this palpable disease. I am concerned with her particular cancer that without that she is going to develop some significant local regional side effects. We discussed mastectomy. We discussed drains. We discussed risks and recovery. I also told her that I would remove anything palpable in her axilla the same time. We will plan on scheduling her in the next month.

## 2020-12-07 NOTE — Anesthesia Preprocedure Evaluation (Signed)
Anesthesia Evaluation  Patient identified by MRN, date of birth, ID band Patient awake    Reviewed: Allergy & Precautions, NPO status , Patient's Chart, lab work & pertinent test results  Airway Mallampati: II  TM Distance: >3 FB Neck ROM: Full    Dental no notable dental hx.    Pulmonary neg pulmonary ROS, former smoker,    Pulmonary exam normal breath sounds clear to auscultation       Cardiovascular hypertension, negative cardio ROS Normal cardiovascular exam Rhythm:Regular Rate:Normal     Neuro/Psych negative neurological ROS  negative psych ROS   GI/Hepatic negative GI ROS, Neg liver ROS,   Endo/Other  negative endocrine ROS  Renal/GU negative Renal ROS  negative genitourinary   Musculoskeletal  (+) Arthritis ,   Abdominal   Peds negative pediatric ROS (+)  Hematology negative hematology ROS (+)   Anesthesia Other Findings Breast cancer  Reproductive/Obstetrics negative OB ROS                             Anesthesia Physical Anesthesia Plan  ASA: 2  Anesthesia Plan: General   Post-op Pain Management:    Induction: Intravenous  PONV Risk Score and Plan: 3 and Treatment may vary due to age or medical condition, Ondansetron, Dexamethasone and Midazolam  Airway Management Planned: LMA  Additional Equipment: None  Intra-op Plan:   Post-operative Plan: Extubation in OR  Informed Consent: I have reviewed the patients History and Physical, chart, labs and discussed the procedure including the risks, benefits and alternatives for the proposed anesthesia with the patient or authorized representative who has indicated his/her understanding and acceptance.     Dental advisory given  Plan Discussed with: CRNA, Anesthesiologist and Surgeon  Anesthesia Plan Comments:         Anesthesia Quick Evaluation

## 2020-12-07 NOTE — Transfer of Care (Signed)
Immediate Anesthesia Transfer of Care Note  Patient: [name]  Procedure(s) Performed: RIGHT MODIFIED RADICAL MASTECTOMY (Right: Breast)  Patient Location: PACU  Anesthesia Type:General  Level of Consciousness: drowsy  Airway & Oxygen Therapy: Patient Spontanous Breathing and Patient connected to face mask oxygen  Post-op Assessment: Report given to RN and Post -op Vital signs reviewed and stable  Post vital signs: Reviewed and stable  Last Vitals:  Vitals Value Taken Time  BP 148/80 12/07/20 1415  Temp    Pulse 86 12/07/20 1416  Resp 16 12/07/20 1416  SpO2 96 % 12/07/20 1416  Vitals shown include unvalidated device data.  Last Pain:  Vitals:   12/07/20 1051  TempSrc: Oral  PainSc: 0-No pain      Patients Stated Pain Goal: 3 (25/89/48 3475)  Complications: No notable events documented.

## 2020-12-07 NOTE — Anesthesia Postprocedure Evaluation (Signed)
Anesthesia Post Note  Patient: [name]  Procedure(s) Performed: RIGHT MODIFIED RADICAL MASTECTOMY (Right: Breast)     Patient location during evaluation: PACU Anesthesia Type: General Level of consciousness: awake and alert Pain management: pain level controlled Vital Signs Assessment: post-procedure vital signs reviewed and stable Respiratory status: spontaneous breathing, nonlabored ventilation and respiratory function stable Cardiovascular status: blood pressure returned to baseline and stable Postop Assessment: no apparent nausea or vomiting Anesthetic complications: no   No notable events documented.  Last Vitals:  Vitals:   12/07/20 1445 12/07/20 1500  BP: 137/85 139/82  Pulse: 76 82  Resp: 16 12  Temp:    SpO2: 99% 98%    Last Pain:  Vitals:   12/07/20 1442  TempSrc:   PainSc: Asleep                 Merlinda Frederick

## 2020-12-07 NOTE — Interval H&P Note (Signed)
History and Physical Interval Note:  12/07/2020 12:19 PM  [name]  has presented today for surgery, with the diagnosis of BREAST CANCER.  The various methods of treatment have been discussed with the patient and family. After consideration of risks, benefits and other options for treatment, the patient has consented to  Procedure(s): RIGHT MODIFIED RADICAL MASTECTOMY (Right) as a surgical intervention.  The patient's history has been reviewed, patient examined, no change in status, stable for surgery.  I have reviewed the patient's chart and labs.  Questions were answered to the patient's satisfaction.     Rolm Bookbinder

## 2020-12-08 MED ORDER — METHOCARBAMOL 500 MG PO TABS: 500.0000 mg | ORAL_TABLET | Freq: Four times a day (QID) | ORAL | 1 refills | Status: AC | PRN

## 2020-12-08 MED ORDER — OXYCODONE HCL 5 MG PO TABS: 5.0000 mg | ORAL_TABLET | ORAL | 0 refills | Status: DC | PRN

## 2020-12-08 NOTE — Discharge Summary (Signed)
Physician Discharge Summary  Patient ID: [name] MRN: [id number] DOB/AGE: 10-09-1965 55 y.o.  Admit date: 12/07/2020 Discharge date: 12/08/2020  Admission Diagnoses: Stage IV breast cancer  Discharge Diagnoses:  Active Problems:   S/P mastectomy, right   Discharged Condition: good  Hospital Course: 55 yof with progressive locoregional disease and stage IV breast cancer.  I did mrm and she has done well postop. Drains as expected, pain fine am after surgery, ready for dc  Consults: None  Significant Diagnostic Studies: none  Treatments: surgery: right mrm  Discharge Exam: Blood pressure (!) 141/71, pulse 86, temperature 98.6 F (37 C), resp. rate 16, height 5\' 9"  (1.753 m), weight 97 kg, SpO2 100 %. Flaps viable, no hematoma, drains serosang  Disposition: Discharge disposition: 01-Home or Self Care       Discharge Instructions     Discharge patient   Complete by: As directed    Discharge disposition: 01-Home or Self Care   Discharge patient date: 12/08/2020      Allergies as of 12/08/2020   No Known Allergies      Medication List     TAKE these medications    abemaciclib 150 MG tablet Commonly known as: VERZENIO Take 1 tablet (150 mg total) by mouth 2 (two) times daily. Swallow tablets whole. Do not chew, crush, or split tablets before swallowing. Start 10/13/2020   acetaminophen 500 MG tablet Commonly known as: TYLENOL Take 1,000 mg by mouth every 6 (six) hours as needed for moderate pain.   B-12 5000 MCG Caps Take 5,000 mcg by mouth daily.   Boric Acid Vaginal 600 MG Supp Place 600 mg vaginally daily as needed (yeast infections).   clobetasol ointment 0.05 % Commonly known as: TEMOVATE Apply 1 application topically 2 (two) times daily as needed (rash/itching).   loperamide 2 MG capsule Commonly known as: IMODIUM Take 2 tablets after first diarrheal bowel movement, then one tablet after each following diarrheal movement; maximum 6  tablets/ day   methocarbamol 500 MG tablet Commonly known as: ROBAXIN Take 1 tablet (500 mg total) by mouth every 6 (six) hours as needed for muscle spasms.   oxyCODONE 5 MG immediate release tablet Commonly known as: Oxy IR/ROXICODONE Take 1 tablet (5 mg total) by mouth every 4 (four) hours as needed for moderate pain.   prochlorperazine 10 MG tablet Commonly known as: COMPAZINE Take 1 tablet (10 mg total) by mouth every 6 (six) hours as needed for nausea or vomiting.   propranolol 10 MG tablet Commonly known as: INDERAL Take 10 mg by mouth 2 (two) times daily.   spironolactone 25 MG tablet Commonly known as: ALDACTONE Take 1 tablet by mouth 2 (two) times daily.   Vitamin D 125 MCG (5000 UT) Caps Take 5,000 Units by mouth daily.        Follow-up Information     Rolm Bookbinder, MD Follow up in 2 week(s).   Specialty: General Surgery Contact information: [address] [place] [address] [phone]                 Signed: Rolm Bookbinder 12/08/2020, 7:23 AM

## 2020-12-10 ENCOUNTER — Encounter (HOSPITAL_BASED_OUTPATIENT_CLINIC_OR_DEPARTMENT_OTHER): Payer: Self-pay | Admitting: General Surgery

## 2020-12-14 ENCOUNTER — Other Ambulatory Visit: Payer: Self-pay | Admitting: Oncology

## 2020-12-14 NOTE — Progress Notes (Unsigned)
[facility]  [phone] [phone]    ID: RANISHA ALLAIRE DOB: [birth year]  MR#: [id number]  ZDG#:[id number]  Patient Care Team: [name], MD as PCP - General (Family Medicine) Mauro Kaufmann, RN as Oncology Nurse Navigator Rockwell Germany, RN as Oncology Nurse Navigator Rolm Bookbinder, MD as Consulting Physician (General Surgery) Flois Mctague, Virgie Dad, MD as Consulting Physician (Oncology) Kyung Rudd, MD as Consulting Physician (Radiation Oncology) Cheri Fowler, MD as Consulting Physician (Obstetrics and Gynecology) Elsie Saas, MD as Consulting Physician (Orthopedic Surgery) Juanita Craver, MD as Consulting Physician (Gastroenterology) Chauncey Cruel, MD OTHER MD:  CHIEF COMPLAINT: weakly estrogen receptor positive breast cancer  CURRENT TREATMENT: Fulvestrant, abemaciclib   INTERVAL HISTORY: Inza underwent right modified radical mastectomy 12/07/2020.  The tumor in the breast measured 2.4 cm maximally.  Margins were clear.  12 of 13 right axillary lymph nodes were positive with evidence of lymphovascular extension.  The prognostic panel is being repeated.  She began abemaciclib on 10/23/2020.  She is tolerating this remarkably well.  She will have between 1 and 3 slightly loose but still formed stools.  After her second stool she takes 2 Imodium and that generally takes care of the problem  She also receives zoledronate every 12 weeks, with her most recent dose 11/06/2020.  She tolerated that with no side effects that she is aware of.  She is scheduled for right mastectomy on 12/07/2020 under Dr. Donne Hazel.   REVIEW OF SYSTEMS: Chaz continues to work full-time.  She wonders if the restrictions that she is under (no lifting more than 20 pounds) can be lifted.  She is also wondering when she will be able to get back to work after her surgery and if she is going to need physical therapy.  Aside from that a detailed review of  systems today was remarkably benign   COVID 19 VACCINATION STATUS: fully vaccinated Therapist, music), with booster   HISTORY OF CURRENT ILLNESS: From the original intake note:  ELISHIA KACZOROWSKI had routine screening mammography showing a possible abnormality in the right breast and a prominent intramammary lymph node. She underwent right diagnostic mammography with tomography and right breast ultrasonography at [facility] on 03/26/2020 showing: breast density category B; palpable 1.8 cm right breast mass at 10 o'clock, which feels much larger than what is visualized sonographically; suspicious 0.6 cm intramammary lymph node in right breast at 10 o'clock; at least 3 morphologically abnormal lymph nodes in right axilla.  Accordingly on 03/27/2020 she proceeded to biopsy of the right breast areas in question. The pathology from this procedure ([id number]) showed:  1. Right Breast, 10 o'clock (mass)  - invasive mammary carcinoma, e-cadherin positive with a lobular growth pattern, grade 2  - mammary carcinoma in situ  - Prognostic indicators significant for: estrogen receptor, 1% positive and progesterone receptor, 1% positive, both with strong staining intensity. Proliferation marker Ki67 at 30%. HER2 negative by immunohistochemistry (0). 2. Right Breast, 10 o'clock (intramammary node)  - mammary carcinoma involving lymph node and perinodal soft tissue  -  Prognostic indicators significant for: estrogen receptor, 1% positive with strong staining intensity and progesterone receptor, 0% negative. Proliferation marker Ki67 at 40%. HER2 negative by immunohistochemistry (0).  Cancer Staging Malignant neoplasm of upper-outer quadrant of right breast in female, estrogen receptor positive [facility]) Staging form: Breast, AJCC 8th Edition - Pathologic stage from 03/31/2020: Stage IV (cM1) - Signed by Gardenia Phlegm, NP on 05/01/2020 - Clinical stage from 04/08/2020: Stage  IIB (cT1c, cN1(f), cM0, G2, ER-,  PR-, HER2-) - Signed by Chauncey Cruel, MD on 04/08/2020 Stage prefix: Initial diagnosis Method of lymph node assessment: Core biopsy  The patient's subsequent history is as detailed below.   PAST MEDICAL HISTORY: Past Medical History:  Diagnosis Date   Allergy    Arthritis 2018   hands, left knee   Breast cancer ([place]) 03-27-2020   right breast IMC   Eczema    Family history of breast cancer    Family history of lung cancer    Family history of prostate cancer    Hypertension     PAST SURGICAL HISTORY: Past Surgical History:  Procedure Laterality Date   ABDOMINAL HYSTERECTOMY     CESAREAN SECTION     KNEE ARTHROSCOPY W/ ACL RECONSTRUCTION Left    KNEE SURGERY     MODIFIED MASTECTOMY Right 12/07/2020   Procedure: RIGHT MODIFIED RADICAL MASTECTOMY;  Surgeon: Rolm Bookbinder, MD;  Location: [facility];  Service: General;  Laterality: Right;   PORTACATH PLACEMENT Left 04/23/2020   Procedure: LEFT SIDE PORT PLACEMENT WITH ULTRASOUND GUIDANCE;  Surgeon: Rolm Bookbinder, MD;  Location: [facility];  Service: General;  Laterality: Left;  230PM START TIME PLEASE ROOM 2 THANKS   TUBAL LIGATION      FAMILY HISTORY: Family History  Problem Relation Age of Onset   Breast cancer Mother 1   Arthritis Mother    Diabetes Father    Kidney disease Father    Hypertension Sister    Heart disease Maternal Grandmother    Breast cancer Paternal Grandmother        dx >50   Heart disease Maternal Aunt    Breast cancer Maternal Aunt 77   Other Maternal Aunt        brain tumor (not cancerous)   Lung cancer Maternal Aunt    Prostate cancer Cousin 57       localized   Her parents are both living, her father age 72 and mother age 39, as of 03/2020. Jojo has 1 brother and 2 sisters. She reports breast cancer in her mother at age 13, her maternal aunt, and her paternal grandmother. Her maternal aunt also has a history of lung cancer. She also reports  prostate cancer in a maternal cousin in his 44's.   GYNECOLOGIC HISTORY:  No LMP recorded. Patient has had a hysterectomy. Menarche: 55 years old Age at first live birth: 55 years old [place] P 2 LMP 2010 with hysterectomy Contraceptive: prior use without issue HRT never used  Hysterectomy? Yes, 2010 for fibroids BSO? 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 She is divorced. She lives at home with son [name], age 43, who works for [employer]. Son Galva, Brooke Bonito, age 80, works as a Development worker, international aid in [place], [place]. Judea has no grandchildren. She attends [employer].    ADVANCED DIRECTIVES: not in place; considering naming mother Melody Haver, son [name], or son Raynald as healthcare powers of attorney   HEALTH MAINTENANCE: Social History   Tobacco Use   Smoking status: Former    Packs/day: 0.25    Years: 36.00    Pack years: 9.00    Types: Cigarettes    Quit date: 04/22/2017    Years since quitting: 3.6   Smokeless tobacco: Never   Tobacco comments:    She is trying to quit.    Substance Use Topics  Alcohol use: Yes    Alcohol/week: 3.0 standard drinks    Types: 3 Glasses of wine per week    Comment: Occasional    Drug use: Never     Colonoscopy: age 68  PAP: date unsure  Bone density: never done   No Known Allergies  Current Outpatient Medications  Medication Sig Dispense Refill   abemaciclib (VERZENIO) 150 MG tablet Take 1 tablet (150 mg total) by mouth 2 (two) times daily. Swallow tablets whole. Do not chew, crush, or split tablets before swallowing. Start 10/13/2020 56 tablet 6   acetaminophen (TYLENOL) 500 MG tablet Take 1,000 mg by mouth every 6 (six) hours as needed for moderate pain.     Boric Acid Vaginal 600 MG SUPP Place 600 mg vaginally daily as needed (yeast infections).     Cholecalciferol (VITAMIN D) 125 MCG (5000 UT) CAPS Take 5,000 Units by mouth daily.      clobetasol ointment (TEMOVATE) 6.25 % Apply 1 application topically 2 (two) times daily as needed (rash/itching).     Cyanocobalamin (B-12) 5000 MCG CAPS Take 5,000 mcg by mouth daily.     loperamide (IMODIUM) 2 MG capsule Take 2 tablets after first diarrheal bowel movement, then one tablet after each following diarrheal movement; maximum 6 tablets/ day 60 capsule 6   methocarbamol (ROBAXIN) 500 MG tablet Take 1 tablet (500 mg total) by mouth every 6 (six) hours as needed for muscle spasms. 30 tablet 1   oxyCODONE (OXY IR/ROXICODONE) 5 MG immediate release tablet Take 1 tablet (5 mg total) by mouth every 4 (four) hours as needed for moderate pain. 10 tablet 0   prochlorperazine (COMPAZINE) 10 MG tablet Take 1 tablet (10 mg total) by mouth every 6 (six) hours as needed for nausea or vomiting. 30 tablet 4   propranolol (INDERAL) 10 MG tablet Take 10 mg by mouth 2 (two) times daily.     spironolactone (ALDACTONE) 25 MG tablet Take 1 tablet by mouth 2 (two) times daily.     No current facility-administered medications for this visit.    OBJECTIVE: African-American woman who appears well  There were no vitals filed for this visit.     There is no height or weight on file to calculate BMI.   Wt Readings from Last 3 Encounters:  12/07/20 213 lb 13.5 oz (97 kg)  12/03/20 217 lb 12.8 oz (98.8 kg)  11/06/20 216 lb 0.8 oz (98 kg)     ECOG FS:1 - Symptomatic but completely ambulatory      LAB RESULTS:  CMP     Component Value Date/Time   NA 140 12/03/2020 0807   K 3.7 12/03/2020 0807   CL 108 12/03/2020 0807   CO2 23 12/03/2020 0807   GLUCOSE 81 12/03/2020 0807   BUN 11 12/03/2020 0807   CREATININE 0.88 12/03/2020 0807   CREATININE 0.75 10/02/2020 1055   CREATININE 0.69 09/24/2013 0819   CALCIUM 9.8 12/03/2020 0807   PROT 7.7 12/03/2020 0807   ALBUMIN 3.8 12/03/2020 0807   AST 18 12/03/2020 0807   AST 20 10/02/2020 1055   ALT 11 12/03/2020 0807   ALT 16 10/02/2020 1055    ALKPHOS 69 12/03/2020 0807   BILITOT <0.2 (L) 12/03/2020 0807   BILITOT 0.4 10/02/2020 1055   GFRNONAA >60 12/03/2020 0807   GFRNONAA >60 10/02/2020 1055   GFRNONAA >89 09/24/2013 0819   GFRAA >89 09/24/2013 0819    No results found for: TOTALPROTELP, ALBUMINELP, A1GS, A2GS, BETS, BETA2SER, [place], MSPIKE,  SPEI  Lab Results  Component Value Date   WBC 3.6 (L) 12/03/2020   NEUTROABS 1.2 (L) 12/03/2020   HGB 9.1 (L) 12/03/2020   HCT 28.4 (L) 12/03/2020   MCV 90.7 12/03/2020   PLT 212 12/03/2020    No results found for: LABCA2  No components found for: XAJOIN867  No results for input(s): INR in the last 168 hours.  No results found for: LABCA2  No results found for: EHM094  No results found for: BSJ628  No results found for: ZMO294  No results found for: CA2729  No components found for: HGQUANT  No results found for: CEA1 / No results found for: CEA1   No results found for: AFPTUMOR  No results found for: CHROMOGRNA  No results found for: KPAFRELGTCHN, LAMBDASER, KAPLAMBRATIO (kappa/lambda light chains)  No results found for: HGBA, HGBA2QUANT, HGBFQUANT, HGBSQUAN (Hemoglobinopathy evaluation)   No results found for: LDH  No results found for: IRON, TIBC, IRONPCTSAT (Iron and TIBC)  No results found for: FERRITIN  Urinalysis No results found for: COLORURINE, APPEARANCEUR, LABSPEC, PHURINE, GLUCOSEU, HGBUR, BILIRUBINUR, KETONESUR, PROTEINUR, UROBILINOGEN, NITRITE, LEUKOCYTESUR   STUDIES: No results found.   ELIGIBLE FOR AVAILABLE RESEARCH PROTOCOL: no  ASSESSMENT: 55 y.o. [place] woman status post right breast upper outer quadrant biopsy 03/27/2020 for a clinical T1c N3 M1, stageIV invasive ductal carcinoma, functionally triple negative, with an MIB-1 of 30-40%  (a) chest CT scan 04/20/2020 shows in addition to the breast mass and regional adenopathy, lytic and sclerotic bone changes, possible left thoracic adenopathy, and indeterminant left pleural  nodularity  (b) bone scan 04/21/2020 confirms multiple bone lesions  (c) thoracic MRI confirms diffuse bony metastatic disease but shows no epidural soft tissue component and no cord compression  (1) genetics testing 04/24/2020 through the [facility] panel found no deleterious mutations in APC, ATM, AXIN2, BARD1, BMPR1A, BRCA1, BRCA2, BRIP1, CDH1, CDK4, CDKN2A, CHEK2, DICER1, EPCAM, GREM1, HOXB13, MEN1, MLH1, MSH2, MSH3, MSH6, MUTYH, NBN, NF1, NF2, NTHL1, PALB2, PMS2, POLD1, POLE, PTEN, RAD51C, RAD51D, RECQL, RET, SDHA, SDHAF2, SDHB, SDHC, SDHD, SMAD4, SMARCA4, STK11, TP53, TSC1, TSC2, and VHL.  RNA data is routinely analyzed for use in variant interpretation for all genes.  (2) neoadjuvant chemotherapy will consist of pembrolizumab every 3 weeks, carboplatin every 3 weeks x 4 with paclitaxel weekly x12, started 04/24/2020, completed 07/10/2020  (a) consider doxorubicin and cyclophosphamide every 3 weeks x 4 at disease progression  (3) zoledronate started 05/08/2018, repeated every 12 weeks  (4) definitive surgery being considered for local control  (5) PD-L1 and molecular studies on the 03/27/2020 biopsy requested 10/22/2020  (6) restaging studies:  (a) post-chemo breast MRI 06/30/2020 shows significant response  (b) CT chest 04/28 shows decreased right axillary adenopathy, no lung or liver lesions, healing of lytic bone lesions  (7) adjuvant pembrolizumab resumed 07/31/2020, continued to disease progression  (8) anastrozole started 07/26/2020 given (weak) estrogen and progesterone receptor positivity  (A) MRI breast 09/28/2020 shows local progression in the right breast and axilla-- anastrozole discontinued  (B) CT of the chest 10/08/2018 shows no lung or liver involvement, bone lesions as before, incompletely imaged previously noted retroperitoneal adenopathy   (9) fulvestrant started 10/08/2020  (A) abemaciclib added 10/24/2018  (10) status post right  modified radical mastectomy 12/07/2020 for a residual ypmT2 ypN3, with negative margins  (A) repeat prognostic panel  (B) PD-L1  (C) foundation 1   PLAN: Shelle started fulvestrant 10/08/2020.  She received her third every 2-week dose on 11/06/2020 and  will receive her first monthly dose today.  I will receive a dose today.  She also was started on abemaciclib as 10/23/2020 and is tolerating that remarkably well.  She is scheduled for right modified radical mastectomy on 12/07/2020.  I am going to see her again in 4 weeks, by which time she should have recovered from her surgery.  Just before that visit she will have restaging studies to serve as a new baseline and to guide future therapy.  She is specifically interested on any work restrictions when she returns to work in exactly when she will be able to go back to work.  She will address these questions with Dr. Donne Hazel when he meets with her.  Total encounter time 25 minutes.Sarajane Jews C. Kelly Ranieri, MD 12/14/20 11:26 AM Medical Oncology and Hematology [facility] [place], [place] [address] Tel. [phone]    Fax. [phone]   I, Wilburn Mylar, am acting as scribe for Dr. Virgie Dad. Cisco Kindt.  I, Lurline Del MD, have reviewed the above documentation for accuracy and completeness, and I agree with the above.   *Total Encounter Time as defined by the Centers for Medicare and Medicaid Services includes, in addition to the face-to-face time of a patient visit (documented in the note above) non-face-to-face time: obtaining and reviewing outside history, ordering and reviewing medications, tests or procedures, care coordination (communications with other health care professionals or caregivers) and documentation in the medical record.

## 2020-12-16 ENCOUNTER — Telehealth: Payer: Self-pay | Admitting: Oncology

## 2020-12-16 NOTE — Telephone Encounter (Signed)
Sch per 9/22 los, pt aware

## 2020-12-22 ENCOUNTER — Telehealth: Payer: Self-pay | Admitting: *Deleted

## 2020-12-22 NOTE — Telephone Encounter (Signed)
This RN contacted path at [facility] per MD request for Foundation 1/PD-LI request on biopsy done 12/07/2020 spoke with Casimer Bilis

## 2020-12-23 ENCOUNTER — Other Ambulatory Visit: Payer: Self-pay | Admitting: *Deleted

## 2020-12-23 DIAGNOSIS — Z170 Estrogen receptor positive status [ER+]: Secondary | ICD-10-CM

## 2020-12-25 ENCOUNTER — Encounter (HOSPITAL_COMMUNITY): Payer: Self-pay

## 2020-12-29 ENCOUNTER — Other Ambulatory Visit: Payer: Self-pay

## 2020-12-29 ENCOUNTER — Ambulatory Visit (HOSPITAL_COMMUNITY)
Admission: RE | Admit: 2020-12-29 | Discharge: 2020-12-29 | Disposition: A | Discharge status: Home / Self Care | Payer: BC Managed Care – PPO | Source: Ambulatory Visit | Attending: Oncology | Admitting: Oncology

## 2020-12-29 DIAGNOSIS — Z170 Estrogen receptor positive status [ER+]: Secondary | ICD-10-CM | POA: Insufficient documentation

## 2020-12-29 DIAGNOSIS — C7951 Secondary malignant neoplasm of bone: Secondary | ICD-10-CM | POA: Insufficient documentation

## 2020-12-29 DIAGNOSIS — C50411 Malignant neoplasm of upper-outer quadrant of right female breast: Secondary | ICD-10-CM | POA: Insufficient documentation

## 2020-12-29 LAB — SURGICAL PATHOLOGY

## 2020-12-29 MED ORDER — IOHEXOL 350 MG/ML SOLN
100.0000 mL | Freq: Once | INTRAVENOUS | Status: AC | PRN
  Administered 2020-12-29: 80 mL via INTRAVENOUS

## 2020-12-31 ENCOUNTER — Inpatient Hospital Stay: Payer: BC Managed Care – PPO

## 2020-12-31 ENCOUNTER — Inpatient Hospital Stay (HOSPITAL_BASED_OUTPATIENT_CLINIC_OR_DEPARTMENT_OTHER): Payer: BC Managed Care – PPO | Admitting: Oncology

## 2020-12-31 ENCOUNTER — Encounter: Payer: Self-pay | Admitting: Oncology

## 2020-12-31 ENCOUNTER — Inpatient Hospital Stay: Payer: BC Managed Care – PPO | Attending: Oncology

## 2020-12-31 ENCOUNTER — Other Ambulatory Visit: Payer: Self-pay

## 2020-12-31 VITALS — BP 133/71 | HR 91 | Temp 97.5°F | Resp 18 | Ht 69.0 in | Wt 208.4 lb

## 2020-12-31 DIAGNOSIS — Z9011 Acquired absence of right breast and nipple: Secondary | ICD-10-CM | POA: Diagnosis not present

## 2020-12-31 DIAGNOSIS — C7951 Secondary malignant neoplasm of bone: Secondary | ICD-10-CM | POA: Diagnosis not present

## 2020-12-31 DIAGNOSIS — Z87891 Personal history of nicotine dependence: Secondary | ICD-10-CM | POA: Insufficient documentation

## 2020-12-31 DIAGNOSIS — C50411 Malignant neoplasm of upper-outer quadrant of right female breast: Secondary | ICD-10-CM

## 2020-12-31 DIAGNOSIS — Z79811 Long term (current) use of aromatase inhibitors: Secondary | ICD-10-CM | POA: Insufficient documentation

## 2020-12-31 DIAGNOSIS — I10 Essential (primary) hypertension: Secondary | ICD-10-CM | POA: Insufficient documentation

## 2020-12-31 DIAGNOSIS — Z170 Estrogen receptor positive status [ER+]: Secondary | ICD-10-CM | POA: Diagnosis not present

## 2020-12-31 DIAGNOSIS — Z79899 Other long term (current) drug therapy: Secondary | ICD-10-CM | POA: Insufficient documentation

## 2020-12-31 DIAGNOSIS — Z95828 Presence of other vascular implants and grafts: Secondary | ICD-10-CM | POA: Insufficient documentation

## 2020-12-31 LAB — T4, FREE: Free T4: 0.91 ng/dL (ref 0.61–1.12)

## 2020-12-31 LAB — CBC WITH DIFFERENTIAL/PLATELET
Abs Immature Granulocytes: 0.02 10*3/uL (ref 0.00–0.07)
Basophils Absolute: 0 10*3/uL (ref 0.0–0.1)
Basophils Relative: 1 %
Eosinophils Absolute: 0.1 10*3/uL (ref 0.0–0.5)
Eosinophils Relative: 1 %
HCT: 28.2 % — ABNORMAL LOW (ref 36.0–46.0)
Hemoglobin: 9 g/dL — ABNORMAL LOW (ref 12.0–15.0)
Immature Granulocytes: 0 %
Lymphocytes Relative: 42 %
Lymphs Abs: 1.9 10*3/uL (ref 0.7–4.0)
MCH: 30.7 pg (ref 26.0–34.0)
MCHC: 31.9 g/dL (ref 30.0–36.0)
MCV: 96.2 fL (ref 80.0–100.0)
Monocytes Absolute: 0.5 10*3/uL (ref 0.1–1.0)
Monocytes Relative: 10 %
Neutro Abs: 2.1 10*3/uL (ref 1.7–7.7)
Neutrophils Relative %: 46 %
Platelets: 252 10*3/uL (ref 150–400)
RBC: 2.93 MIL/uL — ABNORMAL LOW (ref 3.87–5.11)
RDW: 17 % — ABNORMAL HIGH (ref 11.5–15.5)
WBC: 4.6 10*3/uL (ref 4.0–10.5)
nRBC: 0 % (ref 0.0–0.2)

## 2020-12-31 LAB — CMP (CANCER CENTER ONLY)
ALT: 11 U/L (ref 0–44)
AST: 16 U/L (ref 15–41)
Albumin: 4.1 g/dL (ref 3.5–5.0)
Alkaline Phosphatase: 69 U/L (ref 38–126)
Anion gap: 7 (ref 5–15)
BUN: 16 mg/dL (ref 6–20)
CO2: 25 mmol/L (ref 22–32)
Calcium: 10 mg/dL (ref 8.9–10.3)
Chloride: 107 mmol/L (ref 98–111)
Creatinine: 0.93 mg/dL (ref 0.44–1.00)
GFR, Estimated: >60 mL/min (ref >60)
Glucose, Bld: 96 mg/dL (ref 70–99)
Potassium: 4 mmol/L (ref 3.5–5.1)
Sodium: 139 mmol/L (ref 135–145)
Total Bilirubin: 0.4 mg/dL (ref 0.3–1.2)
Total Protein: 8.4 g/dL — ABNORMAL HIGH (ref 6.5–8.1)

## 2020-12-31 MED ORDER — HEPARIN SOD (PORK) LOCK FLUSH 100 UNIT/ML IV SOLN
500.0000 [IU] | Freq: Once | INTRAVENOUS | Status: AC
  Administered 2020-12-31: 500 [IU]

## 2020-12-31 MED ORDER — SODIUM CHLORIDE 0.9% FLUSH
10.0000 mL | Freq: Once | INTRAVENOUS | Status: AC
  Administered 2020-12-31: 10 mL

## 2020-12-31 NOTE — Progress Notes (Signed)
[name]  [phone] [phone]    ID: [name] DOB: 04/24/1965  MR#: [id number]  FFM#:[id number]  Patient Care Team: [name], MD as PCP - General (Family Medicine) Mauro Kaufmann, RN as Oncology Nurse Navigator Rockwell Germany, RN as Oncology Nurse Navigator Rolm Bookbinder, MD as Consulting Physician (General Surgery) Quinton Voth, Virgie Dad, MD as Consulting Physician (Oncology) Kyung Rudd, MD as Consulting Physician (Radiation Oncology) Cheri Fowler, MD as Consulting Physician (Obstetrics and Gynecology) Elsie Saas, MD as Consulting Physician (Orthopedic Surgery) Juanita Craver, MD as Consulting Physician (Gastroenterology) Chauncey Cruel, MD OTHER MD:  CHIEF COMPLAINT: weakly estrogen receptor positive breast cancer  CURRENT TREATMENT: abemaciclib; adjuvant radiation pending   INTERVAL HISTORY: [name] returns today for follow up and treatment of her 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    Since the last visit here she underwent right modified radical mastectomy on 12/07/2020.  The pathology ([id number]) showed a residual 2.4 cm invasive ductal carcinoma, grade 3, and 12 of 13 right axillary lymph nodes removed were positive, with extra capsular extension.  Margins were negative.  The repeat prognostic panel showed this tumor to be triple negative.  Her anastrozole accordingly has been discontinued.  She continues on abemaciclib, which she is tolerating generally well, with rare and mild cases of diarrhea which she easily controls with Imodium.  She is scheduled for reevaluation by radiation and anticipates adjuvant radiation to the right chest wall area and axilla beginning later this month  She also receives zoledronate every 12 weeks, with her most recent dose 11/06/2020.  She tolerated that with no side effects that she is aware of.  She will be due for the next dose November 18 or slightly later  REVIEW OF  SYSTEMS: [name] did remarkably well with her right modified radical mastectomy.  She had no significant problems with bleeding, pain, or swelling.  So far she has had no right upper extremity lymphedema.  She does have some numbness in the right chest wall area.  On the other hand on 12/28/2020 she heard a "pop" behind her right knee.  This was evaluated at [facility] the next day.  They feel she ruptured a Baker's cyst.  She has been written for prednisone which she plans to start tomorrow.  In addition she has "pulled her back" overnight and today has tightness and discomfort in the lower right back.  A detailed review of systems today was otherwise noncontributory  COVID 19 VACCINATION STATUS: fully vaccinated Therapist, music), with booster   HISTORY OF CURRENT ILLNESS: From the original intake note:  [name] had routine screening mammography showing a possible abnormality in the right breast and a prominent intramammary lymph node. She underwent right diagnostic mammography with tomography and right breast ultrasonography at [facility] on 03/26/2020 showing: breast density category B; palpable 1.8 cm right breast mass at 10 o'clock, which feels much larger than what is visualized sonographically; suspicious 0.6 cm intramammary lymph node in right breast at 10 o'clock; at least 3 morphologically abnormal lymph nodes in right axilla.  Accordingly on 03/27/2020 she proceeded to biopsy of the right breast areas in question. The pathology from this procedure ([id number]) showed:  1. Right Breast, 10 o'clock (mass)  - invasive mammary carcinoma, e-cadherin positive with a lobular growth pattern, grade 2  - mammary carcinoma in situ  - Prognostic indicators significant for: estrogen receptor, 1% positive and progesterone receptor, 1% positive, both with strong staining intensity. Proliferation  marker Ki67 at 30%. HER2 negative by immunohistochemistry (0). 2. Right Breast, 10 o'clock (intramammary  node)  - mammary carcinoma involving lymph node and perinodal soft tissue  -  Prognostic indicators significant for: estrogen receptor, 1% positive with strong staining intensity and progesterone receptor, 0% negative. Proliferation marker Ki67 at 40%. HER2 negative by immunohistochemistry (0).  Cancer Staging Malignant neoplasm of upper-outer quadrant of right breast in female, estrogen receptor positive ([place]) Staging form: Breast, AJCC 8th Edition - Pathologic stage from 03/31/2020: Stage IV (cM1) - Signed by Gardenia Phlegm, NP on 05/01/2020 - Clinical stage from 04/08/2020: Stage IIB (cT1c, cN1(f), cM0, G2, ER-, PR-, HER2-) - Signed by Chauncey Cruel, MD on 04/08/2020 Stage prefix: Initial diagnosis Method of lymph node assessment: Core biopsy  The patient's subsequent history is as detailed below.   PAST MEDICAL HISTORY: Past Medical History:  Diagnosis Date   Allergy    Arthritis 2018   hands, left knee   Breast cancer ([place]) 03-27-2020   right breast IMC   Eczema    Family history of breast cancer    Family history of lung cancer    Family history of prostate cancer    Hypertension     PAST SURGICAL HISTORY: Past Surgical History:  Procedure Laterality Date   ABDOMINAL HYSTERECTOMY     CESAREAN SECTION     KNEE ARTHROSCOPY W/ ACL RECONSTRUCTION Left    KNEE SURGERY     MODIFIED MASTECTOMY Right 12/07/2020   Procedure: RIGHT MODIFIED RADICAL MASTECTOMY;  Surgeon: Rolm Bookbinder, MD;  Location: [place];  Service: General;  Laterality: Right;   PORTACATH PLACEMENT Left 04/23/2020   Procedure: LEFT SIDE PORT PLACEMENT WITH ULTRASOUND GUIDANCE;  Surgeon: Rolm Bookbinder, MD;  Location: [place];  Service: General;  Laterality: Left;  230PM START TIME PLEASE ROOM 2 THANKS   TUBAL LIGATION      FAMILY HISTORY: Family History  Problem Relation Age of Onset   Breast cancer Mother 33   Arthritis Mother    Diabetes Father     Kidney disease Father    Hypertension Sister    Heart disease Maternal Grandmother    Breast cancer Paternal Grandmother        dx >50   Heart disease Maternal Aunt    Breast cancer Maternal Aunt 77   Other Maternal Aunt        brain tumor (not cancerous)   Lung cancer Maternal Aunt    Prostate cancer Cousin 71       localized   Her parents are both living, her father age 22 and mother age 33, as of 03/2020. [name] has 1 brother and 2 sisters. She reports breast cancer in her mother at age 75, her maternal aunt, and her paternal grandmother. Her maternal aunt also has a history of lung cancer. She also reports prostate cancer in a maternal cousin in his 80's.   GYNECOLOGIC HISTORY:  No LMP recorded. Patient has had a hysterectomy. Menarche: 55 years old Age at first live birth: 55 years old [place] P 2 LMP 2010 with hysterectomy Contraceptive: prior use without issue HRT never used  Hysterectomy? Yes, 2010 for fibroids BSO? 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 She is divorced. She lives at home with son [name], age 74, who works for [employer]. Son [place], Brooke Bonito, age 38, works as a Development worker, international aid in [place], [place]. [employer]  has no grandchildren. She attends [employer].    ADVANCED DIRECTIVES: not in place; considering naming mother Melody Haver, son [name], or son Raynald as healthcare powers of attorney   HEALTH MAINTENANCE: Social History   Tobacco Use   Smoking status: Former    Packs/day: 0.25    Years: 36.00    Pack years: 9.00    Types: Cigarettes    Quit date: 04/22/2017    Years since quitting: 3.6   Smokeless tobacco: Never   Tobacco comments:    She is trying to quit.    Substance Use Topics   Alcohol use: Yes    Alcohol/week: 3.0 standard drinks    Types: 3 Glasses of wine per week    Comment: Occasional    Drug use: Never     Colonoscopy: age 23  PAP:  date unsure  Bone density: never done   No Known Allergies  Current Outpatient Medications  Medication Sig Dispense Refill   abemaciclib (VERZENIO) 150 MG tablet Take 1 tablet (150 mg total) by mouth 2 (two) times daily. Swallow tablets whole. Do not chew, crush, or split tablets before swallowing. Start 10/13/2020 56 tablet 6   acetaminophen (TYLENOL) 500 MG tablet Take 1,000 mg by mouth every 6 (six) hours as needed for moderate pain.     Boric Acid Vaginal 600 MG SUPP Place 600 mg vaginally daily as needed (yeast infections).     Cholecalciferol (VITAMIN D) 125 MCG (5000 UT) CAPS Take 5,000 Units by mouth daily.     clobetasol ointment (TEMOVATE) 9.45 % Apply 1 application topically 2 (two) times daily as needed (rash/itching).     Cyanocobalamin (B-12) 5000 MCG CAPS Take 5,000 mcg by mouth daily.     loperamide (IMODIUM) 2 MG capsule Take 2 tablets after first diarrheal bowel movement, then one tablet after each following diarrheal movement; maximum 6 tablets/ day 60 capsule 6   methocarbamol (ROBAXIN) 500 MG tablet Take 1 tablet (500 mg total) by mouth every 6 (six) hours as needed for muscle spasms. 30 tablet 1   oxyCODONE (OXY IR/ROXICODONE) 5 MG immediate release tablet Take 1 tablet (5 mg total) by mouth every 4 (four) hours as needed for moderate pain. 10 tablet 0   prochlorperazine (COMPAZINE) 10 MG tablet Take 1 tablet (10 mg total) by mouth every 6 (six) hours as needed for nausea or vomiting. 30 tablet 4   propranolol (INDERAL) 10 MG tablet Take 10 mg by mouth 2 (two) times daily.     spironolactone (ALDACTONE) 25 MG tablet Take 1 tablet by mouth 2 (two) times daily.     No current facility-administered medications for this visit.    OBJECTIVE: African-American woman who appears stated age 28:   12/31/20 1623  BP: 133/71  Pulse: 91  Resp: 18  Temp: (!) 97.5 F (36.4 C)  SpO2: 100%       Body mass index is 30.78 kg/m.   Wt Readings from Last 3 Encounters:   12/31/20 208 lb 6.4 oz (94.5 kg)  12/07/20 213 lb 13.5 oz (97 kg)  12/03/20 217 lb 12.8 oz (98.8 kg)     ECOG FS:1 - Symptomatic but completely ambulatory  Sclerae unicteric, EOMs intact Wearing a mask Lungs no rales or rhonchi Heart regular rate and rhythm Abd soft, nontender, positive bowel sounds MSK no focal spinal tenderness, no right upper extremity lymphedema Neuro: nonfocal, well oriented, positive affect Breasts: The right breast is status post mastectomy and axillary lymph node  dissection.  The incision is healing well, with no erythema dehiscence or swelling.  Multiple Steri-Strips are still in place.  The left breast and left axilla are benign.  LAB RESULTS:  CMP     Component Value Date/Time   NA 140 12/03/2020 0807   K 3.7 12/03/2020 0807   CL 108 12/03/2020 0807   CO2 23 12/03/2020 0807   GLUCOSE 81 12/03/2020 0807   BUN 11 12/03/2020 0807   CREATININE 0.88 12/03/2020 0807   CREATININE 0.75 10/02/2020 1055   CREATININE 0.69 09/24/2013 0819   CALCIUM 9.8 12/03/2020 0807   PROT 7.7 12/03/2020 0807   ALBUMIN 3.8 12/03/2020 0807   AST 18 12/03/2020 0807   AST 20 10/02/2020 1055   ALT 11 12/03/2020 0807   ALT 16 10/02/2020 1055   ALKPHOS 69 12/03/2020 0807   BILITOT <0.2 (L) 12/03/2020 0807   BILITOT 0.4 10/02/2020 1055   GFRNONAA >60 12/03/2020 0807   GFRNONAA >60 10/02/2020 1055   GFRNONAA >89 09/24/2013 0819   GFRAA >89 09/24/2013 0819    No results found for: TOTALPROTELP, ALBUMINELP, A1GS, A2GS, BETS, BETA2SER, GAMS, MSPIKE, SPEI  Lab Results  Component Value Date   WBC 4.6 12/31/2020   NEUTROABS 2.1 12/31/2020   HGB 9.0 (L) 12/31/2020   HCT 28.2 (L) 12/31/2020   MCV 96.2 12/31/2020   PLT 252 12/31/2020    No results found for: LABCA2  No components found for: AOZHYQ657  No results for input(s): INR in the last 168 hours.  No results found for: LABCA2  No results found for: QIO962  No results found for: XBM841  No results found  for: LKG401  No results found for: CA2729  No components found for: HGQUANT  No results found for: CEA1 / No results found for: CEA1   No results found for: AFPTUMOR  No results found for: CHROMOGRNA  No results found for: KPAFRELGTCHN, LAMBDASER, KAPLAMBRATIO (kappa/lambda light chains)  No results found for: HGBA, HGBA2QUANT, HGBFQUANT, HGBSQUAN (Hemoglobinopathy evaluation)   No results found for: LDH  No results found for: IRON, TIBC, IRONPCTSAT (Iron and TIBC)  No results found for: FERRITIN  Urinalysis No results found for: COLORURINE, APPEARANCEUR, LABSPEC, PHURINE, GLUCOSEU, HGBUR, BILIRUBINUR, KETONESUR, PROTEINUR, UROBILINOGEN, NITRITE, LEUKOCYTESUR   STUDIES: CT Chest W Contrast  Result Date: 12/30/2020 CLINICAL DATA:  Restaging metastatic breast cancer the. EXAM: CT CHEST, ABDOMEN, AND PELVIS WITH CONTRAST TECHNIQUE: Multidetector CT imaging of the chest, abdomen and pelvis was performed following the standard protocol during bolus administration of intravenous contrast. CONTRAST:  52m OMNIPAQUE IOHEXOL 350 MG/ML SOLN COMPARISON:  Chest CT 10/07/2020 the heart is normal in size. No pericardial effusion. The aorta is normal in caliber. No dissection or significant atherosclerotic calcifications. The branch vessels are patent. FINDINGS: CT CHEST FINDINGS Cardiovascular: The heart is normal in size. No pericardial effusion. The aorta is normal in caliber. No dissection. The branch vessels are patent. Mediastinum/Nodes: Small scattered mediastinal and hilar lymph nodes but no mass or adenopathy. The esophagus is grossly normal. The thyroid gland is unremarkable and stable Lungs/Pleura: No worrisome pulmonary lesions or pulmonary nodules to suggest metastatic disease. No acute pulmonary findings. No pleural effusions or pleural nodules. Musculoskeletal: New surgical changes involving the right breast from a right mastectomy and probable radiation changes. The right axillary  lymph nodes have also been resected. No findings suspicious for residual or recurrent tumor. Stable appearing diffuse sclerotic metastatic bone disease. There is a lytic lesion in the T8 vertebral body which  is slightly larger. It measures approximately 15 mm and previously measured 9.5 mm. CT ABDOMEN PELVIS FINDINGS Hepatobiliary: No findings suspicious for hepatic metastatic disease. The gallbladder is unremarkable. No common bile duct dilatation. Pancreas: No mass, inflammation or ductal dilatation. Spleen: Normal size.  No focal lesions. Adrenals/Urinary Tract: Adrenal glands are unremarkable. No renal lesions or hydronephrosis. The bladder is unremarkable. Stomach/Bowel: Stomach, duodenum, small bowel and colon unremarkable. Terminal ileum is normal. The appendix is normal. Vascular/Lymphatic: The aorta and branch vessels are patent. The major venous structures are patent. Moderate retroperitoneal lymphadenopathy the is present. I do not have any prior CT scans of the abdomen for comparison. The largest left-sided retroperitoneal lymph node on image 69/2 measures 3.0 x 2.4 cm. Lower left para-aortic node on image 74/2 measures 12.5 mm. Reproductive: The uterus is surgically absent. Both ovaries are still present and appear normal. Other: No pelvic mass or pelvic adenopathy.  No inguinal adenopathy. Musculoskeletal: Stable appearing sclerotic bone lesions. No definite new or progressive findings in the lumbar spine or pelvis. IMPRESSION: 1. New surgical changes from a right mastectomy and right axillary lymph dissection. No findings suspicious for residual or recurrent tumor. 2. 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 There is a lytic lesion in the T8 vertebral body which is slightly larger. No definite new or progressive findings in the lumbar spine or pelvis. 3. Retroperitoneal lymphadenopathy not previously imaged. Recommend attention on future scans. 4. No findings for pulmonary metastatic  disease. Electronically Signed   By: Marijo Sanes M.D.   On: 12/30/2020 16:26   CT Abdomen Pelvis W Contrast  Result Date: 12/30/2020 CLINICAL DATA:  Restaging metastatic breast cancer the. EXAM: CT CHEST, ABDOMEN, AND PELVIS WITH CONTRAST TECHNIQUE: Multidetector CT imaging of the chest, abdomen and pelvis was performed following the standard protocol during bolus administration of intravenous contrast. CONTRAST:  29m OMNIPAQUE IOHEXOL 350 MG/ML SOLN COMPARISON:  Chest CT 10/07/2020 the heart is normal in size. No pericardial effusion. The aorta is normal in caliber. No dissection or significant atherosclerotic calcifications. The branch vessels are patent. FINDINGS: CT CHEST FINDINGS Cardiovascular: The heart is normal in size. No pericardial effusion. The aorta is normal in caliber. No dissection. The branch vessels are patent. Mediastinum/Nodes: Small scattered mediastinal and hilar lymph nodes but no mass or adenopathy. The esophagus is grossly normal. The thyroid gland is unremarkable and stable Lungs/Pleura: No worrisome pulmonary lesions or pulmonary nodules to suggest metastatic disease. No acute pulmonary findings. No pleural effusions or pleural nodules. Musculoskeletal: New surgical changes involving the right breast from a right mastectomy and probable radiation changes. The right axillary lymph nodes have also been resected. No findings suspicious for residual or recurrent tumor. Stable appearing diffuse sclerotic metastatic bone disease. There is a lytic lesion in the T8 vertebral body which is slightly larger. It measures approximately 15 mm and previously measured 9.5 mm. CT ABDOMEN PELVIS FINDINGS Hepatobiliary: No findings suspicious for hepatic metastatic disease. The gallbladder is unremarkable. No common bile duct dilatation. Pancreas: No mass, inflammation or ductal dilatation. Spleen: Normal size.  No focal lesions. Adrenals/Urinary Tract: Adrenal glands are unremarkable. No renal  lesions or hydronephrosis. The bladder is unremarkable. Stomach/Bowel: Stomach, duodenum, small bowel and colon unremarkable. Terminal ileum is normal. The appendix is normal. Vascular/Lymphatic: The aorta and branch vessels are patent. The major venous structures are patent. Moderate retroperitoneal lymphadenopathy the is present. I do not have any prior CT scans of the abdomen for comparison. The largest left-sided retroperitoneal lymph node  on image 69/2 measures 3.0 x 2.4 cm. Lower left para-aortic node on image 74/2 measures 12.5 mm. Reproductive: The uterus is surgically absent. Both ovaries are still present and appear normal. Other: No pelvic mass or pelvic adenopathy.  No inguinal adenopathy. Musculoskeletal: Stable appearing sclerotic bone lesions. No definite new or progressive findings in the lumbar spine or pelvis. IMPRESSION: 1. New surgical changes from a right mastectomy and right axillary lymph dissection. No findings suspicious for residual or recurrent tumor. 2. 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 There is a lytic lesion in the T8 vertebral body which is slightly larger. No definite new or progressive findings in the lumbar spine or pelvis. 3. Retroperitoneal lymphadenopathy not previously imaged. Recommend attention on future scans. 4. No findings for pulmonary metastatic disease. Electronically Signed   By: Marijo Sanes M.D.   On: 12/30/2020 16:26     ELIGIBLE FOR AVAILABLE RESEARCH PROTOCOL: no  ASSESSMENT: 55 y.o.  woman status post right breast upper outer quadrant biopsy 03/27/2020 for a clinical T1c N3 M1, stageIV invasive ductal carcinoma, functionally triple negative, with an MIB-1 of 30-40%  (a) chest CT scan 04/20/2020 shows in addition to the breast mass and regional adenopathy, lytic and sclerotic bone changes, possible left thoracic adenopathy, and indeterminant left pleural nodularity  (b) bone scan 04/21/2020 confirms multiple bone  lesions  (c) thoracic MRI confirms diffuse bony metastatic disease but shows no epidural soft tissue component and no cord compression  (1) genetics testing 04/24/2020 through the [place] [facility] panel found no deleterious mutations in APC, ATM, AXIN2, BARD1, BMPR1A, BRCA1, BRCA2, BRIP1, CDH1, CDK4, CDKN2A, CHEK2, DICER1, EPCAM, GREM1, HOXB13, MEN1, MLH1, MSH2, MSH3, MSH6, MUTYH, NBN, NF1, NF2, NTHL1, PALB2, PMS2, POLD1, POLE, PTEN, RAD51C, RAD51D, RECQL, RET, SDHA, SDHAF2, SDHB, SDHC, SDHD, SMAD4, SMARCA4, STK11, TP53, TSC1, TSC2, and VHL.  RNA data is routinely analyzed for use in variant interpretation for all genes.  (2) neoadjuvant chemotherapy will consist of pembrolizumab every 3 weeks, carboplatin every 3 weeks x 4 with paclitaxel weekly x12, started 04/24/2020, completed 07/10/2020  (a) consider doxorubicin and cyclophosphamide every 3 weeks x 4 at disease progression  (3) zoledronate started 05/08/2018, repeated every 12 weeks  (4) molecular studies:  (A) PT-L1 combined positive score of 5 (on January 2022 specimen)  (B) foundation 1 pending  (5) restaging studies:  (a) post-chemo breast MRI 06/30/2020 shows significant response  (b) CT chest 07/09/2020 shows decreased right axillary adenopathy, no lung or liver lesions, healing of lytic bone lesions  (c) CT chest 10/07/2020 shows progression of the right axillary adenopathy and upper abdominal retroperitoneal adenopathy, with stable scattered sclerotic bone lesions  (6) adjuvant pembrolizumab resumed 07/31/2020, discontinued after 10/02/2020 dose with disease progression  (7) anastrozole started 07/26/2020 given (weak) estrogen and progesterone receptor positivity  (A) MRI breast 09/28/2020 shows local progression in the right breast and axilla-- anastrozole discontinued   (8) fulvestrant started 10/08/2020  (A) abemaciclib added 10/24/2018  (B) fulvestrant discontinued after right modified radical mastectomy  with repeat prognostic panel showing the tumor to be triple negative  (C) abemaciclib continued  (9) status post right modified radical mastectomy on 12/07/2020 for a residual ympT2 ypN3a invasive ductal carcinoma, grade 3, with negative margins  (a) a total of 13 right axillary lymph nodes removed, 12 positive, with evidence of extracapsular extension; residual cancer burden III  (b) repeat prognostic panel estrogen and progesterone receptor negative, HER2 negative (1+).    (10) adjuvant radiation to follow  (  11) restaging studies:  (A) CT scans of the chest abdomen and pelvis 12/29/2020 show no liver or lung involvement.  There is some left retroperitoneal adenopathy which may be the only site of measurable disease and there are some bone lesions.  PLAN: I reviewed the results of the modified radical mastectomy pathology with [facility] today.  She understands Dr. Donne Hazel was able to obtain clear margins which is very positive.  She remains at high risk of local recurrence and will benefit from adjuvant radiation.  She already has appointments here the last week in October with radiation oncology to operationalize that.  We just restaged her with CT scans of the chest abdomen and pelvis.  Fortunately there is no lung or liver involvement.  There is bone involvement as previously noted.  There is also some left retroperitoneal adenopathy.  This was noted previously and may be our only site of measurable disease.  For systemic therapy we have stopped the antiestrogens (Faslodex) but continuing the abemaciclib.  Once she has completed and recovered from radiation we will decide whether to continue abemaciclib and repeat scans in 3 to 28month or switch to capecitabine.  I do not think she is going to be able to return to work before she completes radiation and I wrote a note saying that the day.  I think she may be able to return in December but I do not think she would be able to work a full 10 hours  and I also said that in the note.  She gave u[employer]oreasome papers to fill out and we will be glad to complete those for her.  I also asked her to make sure to discuss with her human resources department to make sure that her job will be waiting for her when she is able to return to it.  Tentatively I am making her a return appointment with me in November.  We will change the details once her radiation treatments have been scheduled.  Total encounter time 45 minutes.*Sarajane JewsC. Sareen Randon, MD 12/31/20 5:27 PM Medical Oncology and Hematology [facility] [place] [id number]. [phone]   Fax. [phone]  I, KWilburn Mylar am acting as scribe for Dr. GVirgie Dad Lucian Baswell.  I, GLurline DelMD, have reviewed the above documentation for accuracy and completeness, and I agree with the above.   *Total Encounter Time as defined by the Centers for Medicare and Medicaid Services includes, in addition to the face-to-face time of a patient visit (documented in the note above) non-face-to-face time: obtaining and reviewing outside history, ordering and reviewing medications, tests or procedures, care coordination (communications with other health care professionals or caregivers) and documentation in the medical record.

## 2021-01-01 ENCOUNTER — Telehealth: Payer: Self-pay | Admitting: Oncology

## 2021-01-01 NOTE — Telephone Encounter (Signed)
Scheduled per 10/20 los, pt has been called and confirmed appt

## 2021-01-04 ENCOUNTER — Other Ambulatory Visit: Payer: Self-pay

## 2021-01-04 ENCOUNTER — Encounter: Payer: Self-pay | Admitting: Physical Therapy

## 2021-01-04 ENCOUNTER — Ambulatory Visit: Payer: BC Managed Care – PPO | Attending: General Surgery | Admitting: Physical Therapy

## 2021-01-04 DIAGNOSIS — Z483 Aftercare following surgery for neoplasm: Secondary | ICD-10-CM

## 2021-01-04 DIAGNOSIS — C50911 Malignant neoplasm of unspecified site of right female breast: Secondary | ICD-10-CM | POA: Insufficient documentation

## 2021-01-04 DIAGNOSIS — R600 Localized edema: Secondary | ICD-10-CM

## 2021-01-04 DIAGNOSIS — C7951 Secondary malignant neoplasm of bone: Secondary | ICD-10-CM | POA: Insufficient documentation

## 2021-01-04 DIAGNOSIS — R293 Abnormal posture: Secondary | ICD-10-CM

## 2021-01-04 DIAGNOSIS — M25611 Stiffness of right shoulder, not elsewhere classified: Secondary | ICD-10-CM

## 2021-01-04 DIAGNOSIS — C50411 Malignant neoplasm of upper-outer quadrant of right female breast: Secondary | ICD-10-CM

## 2021-01-04 DIAGNOSIS — Z170 Estrogen receptor positive status [ER+]: Secondary | ICD-10-CM

## 2021-01-04 NOTE — Therapy (Signed)
[facility] @ [place], [place], [address] Phone: [phone]   Fax:  [phone]  Physical Therapy Treatment  Patient Details  Name: [name] MRN: [id number] Date of Birth: 1965-11-21 Referring Provider (PT): Dr. Rolm Bookbinder   Encounter Date: 01/04/2021   PT End of Session - 01/04/21 1346     Visit Number 2    Number of Visits 10    Date for PT Re-Evaluation 02/01/21    PT Start Time 1448    PT Stop Time 1856    PT Time Calculation (min) 52 min    Activity Tolerance Patient tolerated treatment well    Behavior During Therapy [facility] for tasks assessed/performed             Past Medical History:  Diagnosis Date   Allergy    Arthritis 2018   hands, left knee   Breast cancer ([place]) 03-27-2020   right breast [place]   Eczema    Family history of breast cancer    Family history of lung cancer    Family history of prostate cancer    Hypertension     Past Surgical History:  Procedure Laterality Date   ABDOMINAL HYSTERECTOMY     CESAREAN SECTION     KNEE ARTHROSCOPY W/ ACL RECONSTRUCTION Left    KNEE SURGERY     MODIFIED MASTECTOMY Right 12/07/2020   Procedure: RIGHT MODIFIED RADICAL MASTECTOMY;  Surgeon: Rolm Bookbinder, MD;  Location: [facility];  Service: General;  Laterality: Right;   PORTACATH PLACEMENT Left 04/23/2020   Procedure: LEFT SIDE PORT PLACEMENT WITH ULTRASOUND GUIDANCE;  Surgeon: Rolm Bookbinder, MD;  Location: [facility];  Service: General;  Laterality: Left;  230PM START TIME PLEASE ROOM 2 THANKS   TUBAL LIGATION      There were no vitals filed for this visit.   Subjective Assessment - 01/04/21 1307     Subjective Patient reports she underwent a right modified radical mastectomy (12 of 13 nodes positive) on 12/07/2020. Neoadjuvant chemotherapy 04/24/2020 - 07/10/2020. She will see her radiation oncologist on 01/06/2021. Drains removed  12/17/2020.    Pertinent History Patient was diagnosed on 02/25/2020 with right grade II invasive ductal carcinoma breast cancer. It measures 1.8 cm and is located in the upper outer quadrant. It is functionally triple negative with ER/PR being only 1% and HER2 is negative.She underwent a right modified radical mastectomy (12 of 13 nodes positive) on 12/07/2020. Ki67 is 30%.    Patient Stated Goals Get my arm better    Currently in Pain? No/denies                [facility] PT Assessment - 01/04/21 0001       Assessment   Medical Diagnosis s/p right mastectomy and ALND    Referring Provider (PT) Dr. Rolm Bookbinder    Onset Date/Surgical Date 12/07/20    Hand Dominance Right    Prior Therapy Baselines      Precautions   Precautions Other (comment)    Precaution Comments Right arm lymphedema risk      Restrictions   Weight Bearing Restrictions No      Balance Screen   Has the patient fallen in the past 6 months No    Has the patient had a decrease in activity level because of a fear of falling?  No    Is the patient reluctant to leave their home because of a fear of  falling?  No      Home Environment   Living Environment Private residence    Living Arrangements Children   81 y.o. son   Available Help at Discharge Family      Prior Function   Level of Independence Independent    Vocation Full time employment    Vocation Requirements Not currently working until 02/14/2021    Leisure She is walking 3x/week for a few minutes      Cognition   Overall Cognitive Status Within Functional Limits for tasks assessed      Observation/Other Assessments   Observations Swelling present right latissimus region. Axillary palpable cording present from axilla to mid upper medial arm. Incision healing with steri-strips still in place.      Posture/Postural Control   Posture/Postural Control Postural limitations    Postural Limitations Rounded Shoulders;Forward head      ROM / Strength    AROM / PROM / Strength AROM      AROM   AROM Assessment Site Shoulder    Right/Left Shoulder Right    Right Shoulder Extension 60 Degrees    Right Shoulder Flexion 114 Degrees    Right Shoulder ABduction 126 Degrees    Right Shoulder Internal Rotation 57 Degrees    Right Shoulder External Rotation 88 Degrees      Strength   Overall Strength Within functional limits for tasks performed               LYMPHEDEMA/ONCOLOGY QUESTIONNAIRE - 01/04/21 0001       Type   Cancer Type Right breast cancer      Surgeries   Radical Mastectomy Date 12/07/20    Axillary Lymph Node Dissection Date 12/07/20    Number Lymph Nodes Removed 13      Treatment   Active Chemotherapy Treatment No    Past Chemotherapy Treatment Yes    Date 07/10/20    Active Radiation Treatment No    Past Radiation Treatment No    Current Hormone Treatment No    Past Hormone Therapy No      What other symptoms do you have   Are you Having Heaviness or Tightness Yes    Are you having Pain No    Are you having pitting edema No    Is it Hard or Difficult finding clothes that fit No    Do you have infections No    Is there Decreased scar mobility Yes    [place] Sign No      Right Upper Extremity Lymphedema   10 cm Proximal to Olecranon Process 34 cm    Olecranon Process 28.5 cm    10 cm Proximal to Ulnar Styloid Process 23.9 cm    Just Proximal to Ulnar Styloid Process 17.3 cm    Across Hand at [employer] 21 cm    At [place] of 2nd Digit 6.8 cm      Left Upper Extremity Lymphedema   10 cm Proximal to Olecranon Process 32.8 cm    Olecranon Process 27.6 cm    10 cm Proximal to Ulnar Styloid Process 22.4 cm    Just Proximal to Ulnar Styloid Process 16.7 cm    Across Hand at [employer] 19.9 cm    At [place] of 2nd Digit 7 cm                Quick Dash - 01/04/21 0001     Open a tight or new jar No difficulty  Do heavy household chores (wash walls, wash floors) No difficulty    Carry a  shopping bag or briefcase No difficulty    Wash your back No difficulty    Use a knife to cut food No difficulty    Recreational activities in which you take some force or impact through your arm, shoulder, or hand (golf, hammering, tennis) No difficulty    During the past week, to what extent has your arm, shoulder or hand problem interfered with your normal social activities with family, friends, neighbors, or groups? Not at all    During the past week, to what extent has your arm, shoulder or hand problem limited your work or other regular daily activities Not at all    Arm, shoulder, or hand pain. Mild    Tingling (pins and needles) in your arm, shoulder, or hand Mild    Difficulty Sleeping No difficulty    DASH Score 4.55 %                             PT Education - 01/04/21 1346     Education Details Aftercare; lymphedema education; HEP    Person(s) Educated Patient    Methods Explanation;Demonstration;Handout    Comprehension Returned demonstration;Verbalized understanding                 PT Long Term Goals - 01/04/21 1501       PT LONG TERM GOAL #1   Title Patient will demonstrate she has regained full shoulder ROM and function post operatively compared to baselines.    Time 4    Period Weeks    Status On-going    Target Date 02/01/21      PT LONG TERM GOAL #2   Title Patient will increase right shoulder active flexion and abduction to >/=  degrees for increased ease reaching and to obtain radiation positioning.    Time 4    Period Weeks    Status New    Target Date 02/01/21      PT LONG TERM GOAL #3   Title Patient will improve her DASH score to be back to zero for improved overall arm function.    Time 4    Period Weeks    Status New    Target Date 02/01/21      PT LONG TERM GOAL #4   Title Patient will report >/= 50% improvement in her swelling complaint to tolerate daily tasks with greater ease.    Time 4    Period Weeks     Status New    Target Date 02/01/21      PT LONG TERM GOAL #5   Title Patient will verbalize good understanding of lymphedema risk reduction.    Time 4    Period Weeks    Status New    Target Date 02/01/21                   Plan - 01/04/21 1347     Clinical Impression Statement Patient is healing well s/p right modified radical mastectomy on 12/07/2020. She had 13 axillary nodes removed and 12 were positive. She had previously undergone chemotherapy from 04/24/2020 - 07/10/2020. She will begin radiation soon. She is lacking full shoulder ROM and function, has edema in her right lateral trunk and latissimus region, has tight scar tissue surrounding her incision site, and axillary cording. She will benefit from PT to address those deficits  and help her return to baseline.    PT Frequency 2x / week    PT Duration 4 weeks    PT Treatment/Interventions ADLs/Self Care Home Management;Therapeutic exercise;Patient/family education;Manual techniques;Manual lymph drainage;Passive range of motion;Scar mobilization    PT Next Visit Plan PROM and AAROM right shoulder; progress HEP if appropriate. Address axillary cording and edema when seeing a lymphedema specialist beginning week of 01/18/2021.    Consulted and Agree with Plan of Care Patient             Patient will benefit from skilled therapeutic intervention in order to improve the following deficits and impairments:  Postural dysfunction, Decreased range of motion, Decreased knowledge of precautions, Impaired UE functional use, Pain, Increased edema, Decreased scar mobility, Increased fascial restricitons  Visit Diagnosis: Malignant neoplasm of upper-outer quadrant of right breast in female, estrogen receptor positive ([place]) - Plan: PT plan of care cert/re-cert  Abnormal posture - Plan: PT plan of care cert/re-cert  Aftercare following surgery for neoplasm - Plan: PT plan of care cert/re-cert  Stiffness of right shoulder, not  elsewhere classified - Plan: PT plan of care cert/re-cert  Localized edema - Plan: PT plan of care cert/re-cert     Problem List Patient Active Problem List   Diagnosis Date Noted   Port-A-Cath in place 12/31/2020   S/P mastectomy, right 12/07/2020   Genetic testing 04/24/2020   Bone metastases ([place]) 04/21/2020   Family history of breast cancer    Family history of lung cancer    Family history of prostate cancer    Malignant neoplasm of upper-outer quadrant of right breast in female, estrogen receptor positive ([place]) 04/03/2020   Essential hypertension, benign 02/11/2013   Other malaise and fatigue 02/11/2013   Tobacco use disorder 02/11/2013   Eczema 02/11/2013   Bacterial vaginosis 02/11/2013   Annia Friendly, PT 01/04/21 3:09 PM   [facility] @ [place], [place], [address] Phone: [phone]   Fax:  [phone]  Name: [name] MRN: [id number] Date of Birth: [birth year]

## 2021-01-04 NOTE — Patient Instructions (Addendum)
     [facility]  [address], [address]  [place] [address]  [phone]  After Breast Cancer Class It is recommended you attend the ABC class to be educated on lymphedema risk reduction. This class is free of charge and lasts for 1 hour. It is a 1-time class. You're scheduled for Nov. 7th at 11:00. You need to download the webex app on your phone.  Scar massage You aren't ready to begin this until steri-strips come off and we can inspect your incision.  Compression garment [place] [id number] - you can go here to get a compression sleeve and glove for preventative reasons. It's recommended if you do very repetitive activity or for flying. Call to make an appointment [phone].  Home exercise Program Continue the exercises until you can do them without tightness - we'll be adding on in PT.  Follow up PT: It is recommended you return every 3 months for the first 3 years following surgery to be assessed on the SOZO machine for an L-Dex score. This helps prevent clinically significant lymphedema in 95% of patients. These follow up screens are 10 minute appointments that you are not billed for. You are scheduled for December 12th at 9:00.

## 2021-01-06 ENCOUNTER — Ambulatory Visit
Admission: RE | Admit: 2021-01-06 | Discharge: 2021-01-06 | Disposition: A | Discharge status: Home / Self Care | Payer: BC Managed Care – PPO | Source: Ambulatory Visit | Attending: Radiation Oncology | Admitting: Radiation Oncology

## 2021-01-06 ENCOUNTER — Other Ambulatory Visit: Payer: Self-pay

## 2021-01-06 ENCOUNTER — Encounter: Payer: Self-pay | Admitting: Radiation Oncology

## 2021-01-06 VITALS — BP 125/81 | HR 81 | Temp 97.8°F | Resp 20 | Ht 70.0 in | Wt 208.6 lb

## 2021-01-06 DIAGNOSIS — Z79899 Other long term (current) drug therapy: Secondary | ICD-10-CM | POA: Diagnosis not present

## 2021-01-06 DIAGNOSIS — Z803 Family history of malignant neoplasm of breast: Secondary | ICD-10-CM | POA: Insufficient documentation

## 2021-01-06 DIAGNOSIS — Z510 Encounter for antineoplastic radiation therapy: Secondary | ICD-10-CM | POA: Insufficient documentation

## 2021-01-06 DIAGNOSIS — Z801 Family history of malignant neoplasm of trachea, bronchus and lung: Secondary | ICD-10-CM | POA: Diagnosis not present

## 2021-01-06 DIAGNOSIS — Z170 Estrogen receptor positive status [ER+]: Secondary | ICD-10-CM

## 2021-01-06 DIAGNOSIS — M129 Arthropathy, unspecified: Secondary | ICD-10-CM | POA: Insufficient documentation

## 2021-01-06 DIAGNOSIS — C50411 Malignant neoplasm of upper-outer quadrant of right female breast: Secondary | ICD-10-CM | POA: Insufficient documentation

## 2021-01-06 DIAGNOSIS — C773 Secondary and unspecified malignant neoplasm of axilla and upper limb lymph nodes: Secondary | ICD-10-CM | POA: Diagnosis not present

## 2021-01-06 DIAGNOSIS — I10 Essential (primary) hypertension: Secondary | ICD-10-CM | POA: Insufficient documentation

## 2021-01-06 DIAGNOSIS — Z8042 Family history of malignant neoplasm of prostate: Secondary | ICD-10-CM | POA: Insufficient documentation

## 2021-01-06 DIAGNOSIS — Z87891 Personal history of nicotine dependence: Secondary | ICD-10-CM | POA: Diagnosis not present

## 2021-01-06 DIAGNOSIS — C7951 Secondary malignant neoplasm of bone: Secondary | ICD-10-CM | POA: Insufficient documentation

## 2021-01-06 DIAGNOSIS — Z171 Estrogen receptor negative status [ER-]: Secondary | ICD-10-CM | POA: Insufficient documentation

## 2021-01-06 NOTE — Progress Notes (Signed)
Patient reports RT axilla pain 2/10. No other symptoms reported at this time.  Meaningful use complete.  Patient states NO chances of pregnancy due to partial hysterectomy.  BP 125/81 (BP Location: Left Arm, Patient Position: Sitting, Cuff Size: Large)   Pulse 81   Temp 97.8 F (36.6 C)   Resp 20   Ht 5\' 10"  (1.778 m)   Wt 208 lb 9.6 oz (94.6 kg)   SpO2 100%   BMI 29.93 kg/m

## 2021-01-06 NOTE — Progress Notes (Signed)
Radiation Oncology         [phone]) [phone] ________________________________  Name: [name]        MRN: [id number]  Date of Service: 01/06/2021 DOB: October 04, 1965  OH:[id number], Bernadene Bell, MD  Magrinat, Virgie Dad, MD     REFERRING PHYSICIAN: Magrinat, Virgie Dad, MD   DIAGNOSIS: The encounter diagnosis was Malignant neoplasm of upper-outer quadrant of right breast in female, estrogen receptor positive ([place]).   HISTORY OF PRESENT ILLNESS: [name] is a 55 y.o. female originally seen in the multidisciplinary breast clinic for a new diagnosis of right breast cancer. The patient was noted to have screening detected distortion of the right breast. Diagnostic imaging revealed a mass at 10:00 measuring 1.8 cm, and there was an intramammary noted at 10:00 as well as at least 3 abnormal nodes in the axilla. A biopsy on 03/31/20 revealed a grade 2 invasive ductal carcinoma with DCIS of the 10:00 mass that was ER weakly positive, PR weakly positive, HER2 negative with a Ki 67 of 30%, functionally triple negative essentially. Her intramammary node at 10:00 was also consistent with invasive ductal carcinoma with perinodal tissue, ER weakly positive, PR negative, HER2 negative with a Ki 67 of 40%. An axillary node showed metastatic carcinoma.    Since her last visit, she has received neoadjuvant chemotherapy starting on 2/11/2 with immunotherapy as well. She completed chemotherapy on 07/10/20 and continued with Keytruda. She was started on Faslodex on 10/08/20 and her last Keytruda was on 10/02/20. She underwent right MRM which revealed residual invasive ducalt carcinoma that was multifocal the largest area measured up to 2.4 cm, and negative margins but 12/13 nodes were positive with LVSI. She's seen today to discuss adjuvant postmastectomy radiation. She will also consider Xeloda at the conclusion of radiation with Dr. [name].   PREVIOUS RADIATION THERAPY: No   PAST MEDICAL HISTORY:  Past Medical History:   Diagnosis Date   Allergy    Arthritis 2018   hands, left knee   Breast cancer ([place]) 03-27-2020   right breast IMC   Eczema    Family history of breast cancer    Family history of lung cancer    Family history of prostate cancer    Hypertension        PAST SURGICAL HISTORY: Past Surgical History:  Procedure Laterality Date   ABDOMINAL HYSTERECTOMY     CESAREAN SECTION     KNEE ARTHROSCOPY W/ ACL RECONSTRUCTION Left    KNEE SURGERY     MODIFIED MASTECTOMY Right 12/07/2020   Procedure: RIGHT MODIFIED RADICAL MASTECTOMY;  Surgeon: Rolm Bookbinder, MD;  Location: [facility];  Service: General;  Laterality: Right;   PORTACATH PLACEMENT Left 04/23/2020   Procedure: LEFT SIDE PORT PLACEMENT WITH ULTRASOUND GUIDANCE;  Surgeon: Rolm Bookbinder, MD;  Location: [facility];  Service: General;  Laterality: Left;  230PM START TIME PLEASE ROOM 2 THANKS   TUBAL LIGATION       FAMILY HISTORY:  Family History  Problem Relation Age of Onset   Breast cancer Mother 23   Arthritis Mother    Diabetes Father    Kidney disease Father    Hypertension Sister    Heart disease Maternal Grandmother    Breast cancer Paternal Grandmother        dx >50   Heart disease Maternal Aunt    Breast cancer Maternal Aunt 77   Other Maternal Aunt        brain tumor (not cancerous)  Lung cancer Maternal Aunt    Prostate cancer Cousin 55       localized     SOCIAL HISTORY:  reports that she quit smoking about 3 years ago. Her smoking use included cigarettes. She has a 9.00 pack-year smoking history. She has never used smokeless tobacco. She reports current alcohol use of about 3.0 standard drinks per week. She reports that she does not use drugs. The patient is divorced and lives in [place]. 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 She has two adult sons.  ALLERGIES: Patient has no known allergies.   MEDICATIONS:  Current Outpatient  Medications  Medication Sig Dispense Refill   abemaciclib (VERZENIO) 150 MG tablet Take 1 tablet (150 mg total) by mouth 2 (two) times daily. Swallow tablets whole. Do not chew, crush, or split tablets before swallowing. Start 10/13/2020 56 tablet 6   acetaminophen (TYLENOL) 500 MG tablet Take 1,000 mg by mouth every 6 (six) hours as needed for moderate pain.     Boric Acid Vaginal 600 MG SUPP Place 600 mg vaginally daily as needed (yeast infections).     Cholecalciferol (VITAMIN D) 125 MCG (5000 UT) CAPS Take 5,000 Units by mouth daily.     clobetasol ointment (TEMOVATE) 6.75 % Apply 1 application topically 2 (two) times daily as needed (rash/itching).     Cyanocobalamin (B-12) 5000 MCG CAPS Take 5,000 mcg by mouth daily.     loperamide (IMODIUM) 2 MG capsule Take 2 tablets after first diarrheal bowel movement, then one tablet after each following diarrheal movement; maximum 6 tablets/ day 60 capsule 6   methocarbamol (ROBAXIN) 500 MG tablet Take 1 tablet (500 mg total) by mouth every 6 (six) hours as needed for muscle spasms. 30 tablet 1   oxyCODONE (OXY IR/ROXICODONE) 5 MG immediate release tablet Take 1 tablet (5 mg total) by mouth every 4 (four) hours as needed for moderate pain. 10 tablet 0   prochlorperazine (COMPAZINE) 10 MG tablet Take 1 tablet (10 mg total) by mouth every 6 (six) hours as needed for nausea or vomiting. 30 tablet 4   propranolol (INDERAL) 10 MG tablet Take 10 mg by mouth 2 (two) times daily.     spironolactone (ALDACTONE) 25 MG tablet Take 1 tablet by mouth 2 (two) times daily.     No current facility-administered medications for this encounter.     REVIEW OF SYSTEMS: On review of systems, the patient reports that she is doing really well following her treatments. She is happy that her hair is growing back. She did have some changes in color of her nail beds during treatment. She does have some discomfort in her right axilla since surgery. No other complaints are  verbalized.      PHYSICAL EXAM:  Wt Readings from Last 3 Encounters:  01/06/21 208 lb 9.6 oz (94.6 kg)  12/31/20 208 lb 6.4 oz (94.5 kg)  12/07/20 213 lb 13.5 oz (97 kg)   Temp Readings from Last 3 Encounters:  01/06/21 97.8 F (36.6 C) (Temporal)  12/31/20 (!) 97.5 F (36.4 C) (Temporal)  12/08/20 98.6 F (37 C)   BP Readings from Last 3 Encounters:  01/06/21 125/81  12/31/20 133/71  12/08/20 (!) 141/71   Pulse Readings from Last 3 Encounters:  01/06/21 81  12/31/20 91  12/08/20 84    In general this is a well appearing African American female in no acute distress. She's alert and oriented x4 and appropriate throughout the examination. Cardiopulmonary assessment  is negative for acute distress and she exhibits normal effort. The right chest wall reveals a well intact incision site with several steri strips in place without erythema, fullness or drainage.     ECOG = 1  0 - Asymptomatic (Fully active, able to carry on all predisease activities without restriction)  1 - Symptomatic but completely ambulatory (Restricted in physically strenuous activity but ambulatory and able to carry out work of a light or sedentary nature. For example, light housework, office work)  2 - Symptomatic, <50% in bed during the day (Ambulatory and capable of all self care but unable to carry out any work activities. Up and about more than 50% of waking hours)  3 - Symptomatic, >50% in bed, but not bedbound (Capable of only limited self-care, confined to bed or chair 50% or more of waking hours)  4 - Bedbound (Completely disabled. Cannot carry on any self-care. Totally confined to bed or chair)  5 - Death   Eustace Pen MM, Creech RH, Tormey DC, et al. [phone]). "Toxicity and response criteria of the [facility]". [place] Oncol. 5 (6): 649-55    LABORATORY DATA:  Lab Results  Component Value Date   WBC 4.6 12/31/2020   HGB 9.0 (L) 12/31/2020   HCT 28.2 (L) 12/31/2020    MCV 96.2 12/31/2020   PLT 252 12/31/2020   Lab Results  Component Value Date   NA 139 12/31/2020   K 4.0 12/31/2020   CL 107 12/31/2020   CO2 25 12/31/2020   Lab Results  Component Value Date   ALT 11 12/31/2020   AST 16 12/31/2020   ALKPHOS 69 12/31/2020   BILITOT 0.4 12/31/2020      RADIOGRAPHY: CT Chest W Contrast  Result Date: 12/30/2020 CLINICAL DATA:  Restaging metastatic breast cancer the. EXAM: CT CHEST, ABDOMEN, AND PELVIS WITH CONTRAST TECHNIQUE: Multidetector CT imaging of the chest, abdomen and pelvis was performed following the standard protocol during bolus administration of intravenous contrast. CONTRAST:  36m OMNIPAQUE IOHEXOL 350 MG/ML SOLN COMPARISON:  Chest CT 10/07/2020 the heart is normal in size. No pericardial effusion. The aorta is normal in caliber. No dissection or significant atherosclerotic calcifications. The branch vessels are patent. FINDINGS: CT CHEST FINDINGS Cardiovascular: The heart is normal in size. No pericardial effusion. The aorta is normal in caliber. No dissection. The branch vessels are patent. Mediastinum/Nodes: Small scattered mediastinal and hilar lymph nodes but no mass or adenopathy. The esophagus is grossly normal. The thyroid gland is unremarkable and stable Lungs/Pleura: No worrisome pulmonary lesions or pulmonary nodules to suggest metastatic disease. No acute pulmonary findings. No pleural effusions or pleural nodules. Musculoskeletal: New surgical changes involving the right breast from a right mastectomy and probable radiation changes. The right axillary lymph nodes have also been resected. No findings suspicious for residual or recurrent tumor. Stable appearing diffuse sclerotic metastatic bone disease. There is a lytic lesion in the T8 vertebral body which is slightly larger. It measures approximately 15 mm and previously measured 9.5 mm. CT ABDOMEN PELVIS FINDINGS Hepatobiliary: No findings suspicious for hepatic metastatic disease.  The gallbladder is unremarkable. No common bile duct dilatation. Pancreas: No mass, inflammation or ductal dilatation. Spleen: Normal size.  No focal lesions. Adrenals/Urinary Tract: Adrenal glands are unremarkable. No renal lesions or hydronephrosis. The bladder is unremarkable. Stomach/Bowel: Stomach, duodenum, small bowel and colon unremarkable. Terminal ileum is normal. The appendix is normal. Vascular/Lymphatic: The aorta and branch vessels are patent. The major venous structures are patent.  Moderate retroperitoneal lymphadenopathy the is present. I do not have any prior CT scans of the abdomen for comparison. The largest left-sided retroperitoneal lymph node on image 69/2 measures 3.0 x 2.4 cm. Lower left para-aortic node on image 74/2 measures 12.5 mm. Reproductive: The uterus is surgically absent. Both ovaries are still present and appear normal. Other: No pelvic mass or pelvic adenopathy.  No inguinal adenopathy. Musculoskeletal: Stable appearing sclerotic bone lesions. No definite new or progressive findings in the lumbar spine or pelvis. IMPRESSION: 1. New surgical changes from a right mastectomy and right axillary lymph dissection. No findings suspicious for residual or recurrent tumor. 2. 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 There is a lytic lesion in the T8 vertebral body which is slightly larger. No definite new or progressive findings in the lumbar spine or pelvis. 3. Retroperitoneal lymphadenopathy not previously imaged. Recommend attention on future scans. 4. No findings for pulmonary metastatic disease. Electronically Signed   By: Marijo Sanes M.D.   On: 12/30/2020 16:26   CT Abdomen Pelvis W Contrast  Result Date: 12/30/2020 CLINICAL DATA:  Restaging metastatic breast cancer the. EXAM: CT CHEST, ABDOMEN, AND PELVIS WITH CONTRAST TECHNIQUE: Multidetector CT imaging of the chest, abdomen and pelvis was performed following the standard protocol during bolus administration  of intravenous contrast. CONTRAST:  58m OMNIPAQUE IOHEXOL 350 MG/ML SOLN COMPARISON:  Chest CT 10/07/2020 the heart is normal in size. No pericardial effusion. The aorta is normal in caliber. No dissection or significant atherosclerotic calcifications. The branch vessels are patent. FINDINGS: CT CHEST FINDINGS Cardiovascular: The heart is normal in size. No pericardial effusion. The aorta is normal in caliber. No dissection. The branch vessels are patent. Mediastinum/Nodes: Small scattered mediastinal and hilar lymph nodes but no mass or adenopathy. The esophagus is grossly normal. The thyroid gland is unremarkable and stable Lungs/Pleura: No worrisome pulmonary lesions or pulmonary nodules to suggest metastatic disease. No acute pulmonary findings. No pleural effusions or pleural nodules. Musculoskeletal: New surgical changes involving the right breast from a right mastectomy and probable radiation changes. The right axillary lymph nodes have also been resected. No findings suspicious for residual or recurrent tumor. Stable appearing diffuse sclerotic metastatic bone disease. There is a lytic lesion in the T8 vertebral body which is slightly larger. It measures approximately 15 mm and previously measured 9.5 mm. CT ABDOMEN PELVIS FINDINGS Hepatobiliary: No findings suspicious for hepatic metastatic disease. The gallbladder is unremarkable. No common bile duct dilatation. Pancreas: No mass, inflammation or ductal dilatation. Spleen: Normal size.  No focal lesions. Adrenals/Urinary Tract: Adrenal glands are unremarkable. No renal lesions or hydronephrosis. The bladder is unremarkable. Stomach/Bowel: Stomach, duodenum, small bowel and colon unremarkable. Terminal ileum is normal. The appendix is normal. Vascular/Lymphatic: The aorta and branch vessels are patent. The major venous structures are patent. Moderate retroperitoneal lymphadenopathy the is present. I do not have any prior CT scans of the abdomen for  comparison. The largest left-sided retroperitoneal lymph node on image 69/2 measures 3.0 x 2.4 cm. Lower left para-aortic node on image 74/2 measures 12.5 mm. Reproductive: The uterus is surgically absent. Both ovaries are still present and appear normal. Other: No pelvic mass or pelvic adenopathy.  No inguinal adenopathy. Musculoskeletal: Stable appearing sclerotic bone lesions. No definite new or progressive findings in the lumbar spine or pelvis. IMPRESSION: 1. New surgical changes from a right mastectomy and right axillary lymph dissection. No findings suspicious for residual or recurrent tumor. 2. 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 There is  a lytic lesion in the T8 vertebral body which is slightly larger. No definite new or progressive findings in the lumbar spine or pelvis. 3. Retroperitoneal lymphadenopathy not previously imaged. Recommend attention on future scans. 4. No findings for pulmonary metastatic disease. Electronically Signed   By: Marijo Sanes M.D.   On: 12/30/2020 16:26        IMPRESSION/PLAN: 1. 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 Dr. Lisbeth Renshaw discusses the final pathology findings and reviews the nature of node positive breast disease. She has done well from systemic therapy and surgery. She does not anticipate reconstruction at this time. However, she would benefit from  adjuvant radiotherapy to the chest wall and regional nodes with the goal of reducing local recurrence.  We discussed the risks, benefits, short, and long term effects of radiotherapy, as well as the curative intent, and the patient is interested in proceeding. Dr. Lisbeth Renshaw discusses the delivery and logistics of radiotherapy and recommends  6 1/2 weeks of radiotherapy to the chest wall as well as regional nodes. Written consent is obtained and placed in the chart, a copy  was provided to the patient. She will simulate this morning. 2. Possible genetic predisposition to malignancy. The patient is a candidate for genetic testing given her personal history. She was offered referral and is interested in meeting with genetics whom she will meet today..   In a visit lasting 45 minutes, greater than 50% of the time was spent face to face reviewing her case, as well as in preparation of, discussing, and coordinating the patient's care.  The above documentation reflects my direct findings during this shared patient visit. Please see the separate note by Dr. Lisbeth Renshaw on this date for the remainder of the patient's plan of care.    Carola Rhine, PAC

## 2021-01-06 NOTE — Progress Notes (Signed)
Radiation Oncology         [phone] ________________________________  Name: [name]        MRN: [id number]  Date of Service: 01/06/2021 DOB: Jun 27, 1965  WU:[id number], Bernadene Bell, MD  Magrinat, Virgie Dad, MD     REFERRING PHYSICIAN: Magrinat, Virgie Dad, MD   DIAGNOSIS: The encounter diagnosis was Malignant neoplasm of upper-outer quadrant of right breast in female, estrogen receptor positive ([place]).   HISTORY OF PRESENT ILLNESS: [name] is a 55 y.o. female originally seen in the multidisciplinary breast clinic for a new diagnosis of right breast cancer. The patient was noted to have screening detected distortion of the right breast. Diagnostic imaging revealed a mass at 10:00 measuring 1.8 cm, and there was an intramammary noted at 10:00 as well as at least 3 abnormal nodes in the axilla. A biopsy on 03/31/20 revealed a grade 2 invasive ductal carcinoma with DCIS of the 10:00 mass that was ER weakly positive, PR weakly positive, HER2 negative with a Ki 67 of 30%, functionally triple negative essentially. Her intramammary node at 10:00 was also consistent with invasive ductal carcinoma with perinodal tissue, ER weakly positive, PR negative, HER2 negative with a Ki 67 of 40%. An axillary node showed metastatic carcinoma.    Since her last visit, she has received neoadjuvant chemotherapy starting on 2/11/2 with immunotherapy as well. She completed chemotherapy on 07/10/20 and continued with Keytruda. She was started on Faslodex on 10/08/20 and her last Keytruda was on 10/02/20. She underwent right MRM which revealed residual invasive ducalt carcinoma that was multifocal the largest area measured up to 2.4 cm, and negative margins but 12/13 nodes were positive with LVSI.    PREVIOUS RADIATION THERAPY: No   PAST MEDICAL HISTORY:  Past Medical History:  Diagnosis Date   Allergy    Arthritis 2018   hands, left knee   Breast cancer ([place]) 03-27-2020   right breast IMC   Eczema    Family  history of breast cancer    Family history of lung cancer    Family history of prostate cancer    Hypertension        PAST SURGICAL HISTORY: Past Surgical History:  Procedure Laterality Date   ABDOMINAL HYSTERECTOMY     CESAREAN SECTION     KNEE ARTHROSCOPY W/ ACL RECONSTRUCTION Left    KNEE SURGERY     MODIFIED MASTECTOMY Right 12/07/2020   Procedure: RIGHT MODIFIED RADICAL MASTECTOMY;  Surgeon: Rolm Bookbinder, MD;  Location: [facility];  Service: General;  Laterality: Right;   PORTACATH PLACEMENT Left 04/23/2020   Procedure: LEFT SIDE PORT PLACEMENT WITH ULTRASOUND GUIDANCE;  Surgeon: Rolm Bookbinder, MD;  Location: [facility];  Service: General;  Laterality: Left;  230PM START TIME PLEASE ROOM 2 THANKS   TUBAL LIGATION       FAMILY HISTORY:  Family History  Problem Relation Age of Onset   Breast cancer Mother 32   Arthritis Mother    Diabetes Father    Kidney disease Father    Hypertension Sister    Heart disease Maternal Grandmother    Breast cancer Paternal Grandmother        dx >50   Heart disease Maternal Aunt    Breast cancer Maternal Aunt 77   Other Maternal Aunt        brain tumor (not cancerous)   Lung cancer Maternal Aunt    Prostate cancer Cousin 56       localized  SOCIAL HISTORY:  reports that she quit smoking about 3 years ago. Her smoking use included cigarettes. She has a 9.00 pack-year smoking history. She has never used smokeless tobacco. She reports current alcohol use of about 3.0 standard drinks per week. She reports that she does not use drugs. The patient is divorced and lives in [place]. 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 She's accompanied by her son [name]. She has another adult son who lives in [place].   ALLERGIES: Patient has no known allergies.   MEDICATIONS:  Current Outpatient Medications  Medication Sig Dispense Refill   abemaciclib (VERZENIO) 150 MG tablet  Take 1 tablet (150 mg total) by mouth 2 (two) times daily. Swallow tablets whole. Do not chew, crush, or split tablets before swallowing. Start 10/13/2020 56 tablet 6   acetaminophen (TYLENOL) 500 MG tablet Take 1,000 mg by mouth every 6 (six) hours as needed for moderate pain.     Boric Acid Vaginal 600 MG SUPP Place 600 mg vaginally daily as needed (yeast infections).     Cholecalciferol (VITAMIN D) 125 MCG (5000 UT) CAPS Take 5,000 Units by mouth daily.     clobetasol ointment (TEMOVATE) 9.03 % Apply 1 application topically 2 (two) times daily as needed (rash/itching).     Cyanocobalamin (B-12) 5000 MCG CAPS Take 5,000 mcg by mouth daily.     loperamide (IMODIUM) 2 MG capsule Take 2 tablets after first diarrheal bowel movement, then one tablet after each following diarrheal movement; maximum 6 tablets/ day 60 capsule 6   methocarbamol (ROBAXIN) 500 MG tablet Take 1 tablet (500 mg total) by mouth every 6 (six) hours as needed for muscle spasms. 30 tablet 1   oxyCODONE (OXY IR/ROXICODONE) 5 MG immediate release tablet Take 1 tablet (5 mg total) by mouth every 4 (four) hours as needed for moderate pain. 10 tablet 0   prochlorperazine (COMPAZINE) 10 MG tablet Take 1 tablet (10 mg total) by mouth every 6 (six) hours as needed for nausea or vomiting. 30 tablet 4   propranolol (INDERAL) 10 MG tablet Take 10 mg by mouth 2 (two) times daily.     spironolactone (ALDACTONE) 25 MG tablet Take 1 tablet by mouth 2 (two) times daily.     No current facility-administered medications for this encounter.     REVIEW OF SYSTEMS: On review of systems, the patient reports that she is doing well overall. She denies any concerns at this time specific to her breast but is taking all the information she can about her cancer.      PHYSICAL EXAM:  Wt Readings from Last 3 Encounters:  12/31/20 208 lb 6.4 oz (94.5 kg)  12/07/20 213 lb 13.5 oz (97 kg)  12/03/20 217 lb 12.8 oz (98.8 kg)   Temp Readings from Last 3  Encounters:  12/31/20 (!) 97.5 F (36.4 C) (Temporal)  12/08/20 98.6 F (37 C)  12/03/20 97.7 F (36.5 C) (Temporal)   BP Readings from Last 3 Encounters:  12/31/20 133/71  12/08/20 (!) 141/71  12/03/20 (!) 156/87   Pulse Readings from Last 3 Encounters:  12/31/20 91  12/08/20 84  12/03/20 90    In general this is a well appearing African American female in no acute distress. She's alert and oriented x4 and appropriate throughout the examination. Cardiopulmonary assessment is negative for acute distress and she exhibits normal effort. Bilateral breast exam is deferred.    ECOG = 0  0 - Asymptomatic (Fully active, able to  carry on all predisease activities without restriction)  1 - Symptomatic but completely ambulatory (Restricted in physically strenuous activity but ambulatory and able to carry out work of a light or sedentary nature. For example, light housework, office work)  2 - Symptomatic, <50% in bed during the day (Ambulatory and capable of all self care but unable to carry out any work activities. Up and about more than 50% of waking hours)  3 - Symptomatic, >50% in bed, but not bedbound (Capable of only limited self-care, confined to bed or chair 50% or more of waking hours)  4 - Bedbound (Completely disabled. Cannot carry on any self-care. Totally confined to bed or chair)  5 - Death   Eustace Pen MM, Creech RH, Tormey DC, et al. [phone]). "Toxicity and response criteria of the [facility]". [place] Oncol. 5 (6): 649-55    LABORATORY DATA:  Lab Results  Component Value Date   WBC 4.6 12/31/2020   HGB 9.0 (L) 12/31/2020   HCT 28.2 (L) 12/31/2020   MCV 96.2 12/31/2020   PLT 252 12/31/2020   Lab Results  Component Value Date   NA 139 12/31/2020   K 4.0 12/31/2020   CL 107 12/31/2020   CO2 25 12/31/2020   Lab Results  Component Value Date   ALT 11 12/31/2020   AST 16 12/31/2020   ALKPHOS 69 12/31/2020   BILITOT 0.4 12/31/2020       RADIOGRAPHY: CT Chest W Contrast  Result Date: 12/30/2020 CLINICAL DATA:  Restaging metastatic breast cancer the. EXAM: CT CHEST, ABDOMEN, AND PELVIS WITH CONTRAST TECHNIQUE: Multidetector CT imaging of the chest, abdomen and pelvis was performed following the standard protocol during bolus administration of intravenous contrast. CONTRAST:  75m OMNIPAQUE IOHEXOL 350 MG/ML SOLN COMPARISON:  Chest CT 10/07/2020 the heart is normal in size. No pericardial effusion. The aorta is normal in caliber. No dissection or significant atherosclerotic calcifications. The branch vessels are patent. FINDINGS: CT CHEST FINDINGS Cardiovascular: The heart is normal in size. No pericardial effusion. The aorta is normal in caliber. No dissection. The branch vessels are patent. Mediastinum/Nodes: Small scattered mediastinal and hilar lymph nodes but no mass or adenopathy. The esophagus is grossly normal. The thyroid gland is unremarkable and stable Lungs/Pleura: No worrisome pulmonary lesions or pulmonary nodules to suggest metastatic disease. No acute pulmonary findings. No pleural effusions or pleural nodules. Musculoskeletal: New surgical changes involving the right breast from a right mastectomy and probable radiation changes. The right axillary lymph nodes have also been resected. No findings suspicious for residual or recurrent tumor. Stable appearing diffuse sclerotic metastatic bone disease. There is a lytic lesion in the T8 vertebral body which is slightly larger. It measures approximately 15 mm and previously measured 9.5 mm. CT ABDOMEN PELVIS FINDINGS Hepatobiliary: No findings suspicious for hepatic metastatic disease. The gallbladder is unremarkable. No common bile duct dilatation. Pancreas: No mass, inflammation or ductal dilatation. Spleen: Normal size.  No focal lesions. Adrenals/Urinary Tract: Adrenal glands are unremarkable. No renal lesions or hydronephrosis. The bladder is unremarkable. Stomach/Bowel: Stomach,  duodenum, small bowel and colon unremarkable. Terminal ileum is normal. The appendix is normal. Vascular/Lymphatic: The aorta and branch vessels are patent. The major venous structures are patent. Moderate retroperitoneal lymphadenopathy the is present. I do not have any prior CT scans of the abdomen for comparison. The largest left-sided retroperitoneal lymph node on image 69/2 measures 3.0 x 2.4 cm. Lower left para-aortic node on image 74/2 measures 12.5 mm. Reproductive: The uterus  is surgically absent. Both ovaries are still present and appear normal. Other: No pelvic mass or pelvic adenopathy.  No inguinal adenopathy. Musculoskeletal: Stable appearing sclerotic bone lesions. No definite new or progressive findings in the lumbar spine or pelvis. IMPRESSION: 1. New surgical changes from a right mastectomy and right axillary lymph dissection. No findings suspicious for residual or recurrent tumor. 2. 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 There is a lytic lesion in the T8 vertebral body which is slightly larger. No definite new or progressive findings in the lumbar spine or pelvis. 3. Retroperitoneal lymphadenopathy not previously imaged. Recommend attention on future scans. 4. No findings for pulmonary metastatic disease. Electronically Signed   By: Marijo Sanes M.D.   On: 12/30/2020 16:26   CT Abdomen Pelvis W Contrast  Result Date: 12/30/2020 CLINICAL DATA:  Restaging metastatic breast cancer the. EXAM: CT CHEST, ABDOMEN, AND PELVIS WITH CONTRAST TECHNIQUE: Multidetector CT imaging of the chest, abdomen and pelvis was performed following the standard protocol during bolus administration of intravenous contrast. CONTRAST:  59m OMNIPAQUE IOHEXOL 350 MG/ML SOLN COMPARISON:  Chest CT 10/07/2020 the heart is normal in size. No pericardial effusion. The aorta is normal in caliber. No dissection or significant atherosclerotic calcifications. The branch vessels are patent. FINDINGS: CT CHEST  FINDINGS Cardiovascular: The heart is normal in size. No pericardial effusion. The aorta is normal in caliber. No dissection. The branch vessels are patent. Mediastinum/Nodes: Small scattered mediastinal and hilar lymph nodes but no mass or adenopathy. The esophagus is grossly normal. The thyroid gland is unremarkable and stable Lungs/Pleura: No worrisome pulmonary lesions or pulmonary nodules to suggest metastatic disease. No acute pulmonary findings. No pleural effusions or pleural nodules. Musculoskeletal: New surgical changes involving the right breast from a right mastectomy and probable radiation changes. The right axillary lymph nodes have also been resected. No findings suspicious for residual or recurrent tumor. Stable appearing diffuse sclerotic metastatic bone disease. There is a lytic lesion in the T8 vertebral body which is slightly larger. It measures approximately 15 mm and previously measured 9.5 mm. CT ABDOMEN PELVIS FINDINGS Hepatobiliary: No findings suspicious for hepatic metastatic disease. The gallbladder is unremarkable. No common bile duct dilatation. Pancreas: No mass, inflammation or ductal dilatation. Spleen: Normal size.  No focal lesions. Adrenals/Urinary Tract: Adrenal glands are unremarkable. No renal lesions or hydronephrosis. The bladder is unremarkable. Stomach/Bowel: Stomach, duodenum, small bowel and colon unremarkable. Terminal ileum is normal. The appendix is normal. Vascular/Lymphatic: The aorta and branch vessels are patent. The major venous structures are patent. Moderate retroperitoneal lymphadenopathy the is present. I do not have any prior CT scans of the abdomen for comparison. The largest left-sided retroperitoneal lymph node on image 69/2 measures 3.0 x 2.4 cm. Lower left para-aortic node on image 74/2 measures 12.5 mm. Reproductive: The uterus is surgically absent. Both ovaries are still present and appear normal. Other: No pelvic mass or pelvic adenopathy.  No  inguinal adenopathy. Musculoskeletal: Stable appearing sclerotic bone lesions. No definite new or progressive findings in the lumbar spine or pelvis. IMPRESSION: 1. New surgical changes from a right mastectomy and right axillary lymph dissection. No findings suspicious for residual or recurrent tumor. 2. 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 There is a lytic lesion in the T8 vertebral body which is slightly larger. No definite new or progressive findings in the lumbar spine or pelvis. 3. Retroperitoneal lymphadenopathy not previously imaged. Recommend attention on future scans. 4. No findings for pulmonary metastatic disease. Electronically Signed  By: Marijo Sanes M.D.   On: 12/30/2020 16:26        IMPRESSION/PLAN: 1. 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 Dr. Lisbeth Renshaw discusses the pathology findings and reviews the nature of node positive breast disease. The consensus from the breast conference includes  proceeding with neoadjuvant chemotherapy, with surgery to be determined. Dr. Lisbeth Renshaw reviews the rationale for adjuvant radiotherapy to the right breast or chest wall and regional nodes at the appropriate time.  We discussed the risks, benefits, short, and long term effects of radiotherapy, as well as the curative intent, and the patient is interested in proceeding. Dr. Lisbeth Renshaw discusses the delivery and logistics of radiotherapy and anticipates a course of 6 1/2 weeks of radiotherapy to the breast or chest wall as well as regional nodes. We will see her back a few weeks after surgery to discuss the simulation process and anticipate we starting radiotherapy about 4-6 weeks after surgery.  2. Possible genetic predisposition to malignancy. The patient is a candidate for genetic testing given her personal history. She was offered referral and is interested in meeting with genetics whom she will meet today..   In a visit  lasting 60 minutes, greater than 50% of the time was spent face to face reviewing her case, as well as in preparation of, discussing, and coordinating the patient's care.  The above documentation reflects my direct findings during this shared patient visit. Please see the separate note by Dr. Lisbeth Renshaw on this date for the remainder of the patient's plan of care.    Carola Rhine, PAC

## 2021-01-08 ENCOUNTER — Ambulatory Visit: Payer: BC Managed Care – PPO | Admitting: Physical Therapy

## 2021-01-08 ENCOUNTER — Other Ambulatory Visit: Payer: Self-pay

## 2021-01-08 DIAGNOSIS — C50411 Malignant neoplasm of upper-outer quadrant of right female breast: Secondary | ICD-10-CM

## 2021-01-08 DIAGNOSIS — Z483 Aftercare following surgery for neoplasm: Secondary | ICD-10-CM

## 2021-01-08 DIAGNOSIS — M25611 Stiffness of right shoulder, not elsewhere classified: Secondary | ICD-10-CM

## 2021-01-08 DIAGNOSIS — C50911 Malignant neoplasm of unspecified site of right female breast: Secondary | ICD-10-CM | POA: Diagnosis not present

## 2021-01-08 DIAGNOSIS — R293 Abnormal posture: Secondary | ICD-10-CM

## 2021-01-08 DIAGNOSIS — R600 Localized edema: Secondary | ICD-10-CM

## 2021-01-08 NOTE — Therapy (Signed)
[facility] @ [facility] [place], [place], [address] Phone: [phone]   Fax:  [phone]  Physical Therapy Treatment  Patient Details  Name: [name] MRN: [id number] Date of Birth: 1966-01-23 Referring Provider (PT): Dr. Rolm Bookbinder   Encounter Date: 01/08/2021   PT End of Session - 01/08/21 1017     Visit Number 3    Number of Visits 10    Date for PT Re-Evaluation 02/01/21    PT Start Time 1016    PT Stop Time 1054    PT Time Calculation (min) 38 min    Activity Tolerance Patient tolerated treatment well    Behavior During Therapy [facility] for tasks assessed/performed             Past Medical History:  Diagnosis Date   Allergy    Arthritis 2018   hands, left knee   Breast cancer ([place]) 03-27-2020   right breast IMC   Eczema    Family history of breast cancer    Family history of lung cancer    Family history of prostate cancer    Hypertension     Past Surgical History:  Procedure Laterality Date   ABDOMINAL HYSTERECTOMY     CESAREAN SECTION     KNEE ARTHROSCOPY W/ ACL RECONSTRUCTION Left    KNEE SURGERY     MODIFIED MASTECTOMY Right 12/07/2020   Procedure: RIGHT MODIFIED RADICAL MASTECTOMY;  Surgeon: Rolm Bookbinder, MD;  Location: [facility];  Service: General;  Laterality: Right;   PORTACATH PLACEMENT Left 04/23/2020   Procedure: LEFT SIDE PORT PLACEMENT WITH ULTRASOUND GUIDANCE;  Surgeon: Rolm Bookbinder, MD;  Location: [facility];  Service: General;  Laterality: Left;  230PM START TIME PLEASE ROOM 2 THANKS   TUBAL LIGATION      There were no vitals filed for this visit.   Subjective Assessment - 01/08/21 1018     Subjective I think something broke free in my shoulder bc I can move it better.    Pertinent History Patient was diagnosed on 02/25/2020 with right grade II invasive ductal carcinoma breast cancer. It measures 1.8 cm and is located in the  upper outer quadrant. It is functionally triple negative with ER/PR being only 1% and HER2 is negative.She underwent a right modified radical mastectomy (12 of 13 nodes positive) on 12/07/2020. Ki67 is 30%.    Currently in Pain? No/denies    Multiple Pain Sites No                [facility] PT Assessment - 01/08/21 0001       AROM   Right Shoulder Extension 60 Degrees    Right Shoulder Flexion 145 Degrees    Right Shoulder ABduction 145 Degrees    Right Shoulder External Rotation 90 Degrees                           [facility] Adult PT Treatment/Exercise - 01/08/21 0001       Shoulder Exercises: Standing   Other Standing Exercises Roll ball up the wall with end range hold 3 sec 5x    Other Standing Exercises AAROM abduction slide up the wall with hip lean 10x      Manual Therapy   Manual Therapy Passive ROM    Passive ROM RT shoulder all planes 2x10  PT Education - 01/08/21 1052     Education Details Avoiding repetitive RTUE use to decrease chance of lymphedema,    Person(s) Educated Patient    Methods Explanation    Comprehension Verbalized understanding                 PT Long Term Goals - 01/04/21 1501       PT LONG TERM GOAL #1   Title Patient will demonstrate she has regained full shoulder ROM and function post operatively compared to baselines.    Time 4    Period Weeks    Status On-going    Target Date 02/01/21      PT LONG TERM GOAL #2   Title Patient will increase right shoulder active flexion and abduction to >/=  degrees for increased ease reaching and to obtain radiation positioning.    Time 4    Period Weeks    Status New    Target Date 02/01/21      PT LONG TERM GOAL #3   Title Patient will improve her DASH score to be back to zero for improved overall arm function.    Time 4    Period Weeks    Status New    Target Date 02/01/21      PT LONG TERM GOAL #4   Title Patient will report >/= 50%  improvement in her swelling complaint to tolerate daily tasks with greater ease.    Time 4    Period Weeks    Status New    Target Date 02/01/21      PT LONG TERM GOAL #5   Title Patient will verbalize good understanding of lymphedema risk reduction.    Time 4    Period Weeks    Status New    Target Date 02/01/21                   Plan - 01/08/21 1017     Clinical Impression Statement Pt arrives today with no complaints of pain and compliance with her HEP. Pt reports feeling something "pop" in her chest and she feels since then her shoulder motion is better. AROM measurements improved since eval. Pt reports she "swings" and "stretches" her arm when walking ( about 20-30 min). PTA educated pt in increasing her lymphedemia risk with repetitive arm/shoulder movements. PTA suggested 5-10 stretches or movements that feel good, then give it a break until repeating. Pt verbally understood the concept.    PT Frequency 2x / week    PT Duration 4 weeks    PT Treatment/Interventions ADLs/Self Care Home Management;Therapeutic exercise;Patient/family education;Manual techniques;Manual lymph drainage;Passive range of motion;Scar mobilization    PT Next Visit Plan PROM and AAROM right shoulder; progress HEP if appropriate. Address axillary cording and edema when seeing a lymphedema specialist beginning week of 01/18/2021.    PT Home Exercise Plan progress HEP next, gentle shoulder stabilizations/strength as AROM is WNL.    Consulted and Agree with Plan of Care Patient             Patient will benefit from skilled therapeutic intervention in order to improve the following deficits and impairments:  Postural dysfunction, Decreased range of motion, Decreased knowledge of precautions, Impaired UE functional use, Pain, Increased edema, Decreased scar mobility, Increased fascial restricitons  Visit Diagnosis: Malignant neoplasm of upper-outer quadrant of right breast in female, estrogen receptor  positive ([place])  Abnormal posture  Aftercare following surgery for neoplasm  Stiffness of right shoulder, not  elsewhere classified  Localized edema     Problem List Patient Active Problem List   Diagnosis Date Noted   Port-A-Cath in place 12/31/2020   S/P mastectomy, right 12/07/2020   Genetic testing 04/24/2020   Bone metastases ([place]) 04/21/2020   Family history of breast cancer    Family history of lung cancer    Family history of prostate cancer    Malignant neoplasm of upper-outer quadrant of right breast in female, estrogen receptor positive ([place]) 04/03/2020   Essential hypertension, benign 02/11/2013   Other malaise and fatigue 02/11/2013   Tobacco use disorder 02/11/2013   Eczema 02/11/2013   Bacterial vaginosis 02/11/2013    Verner Mccrone, PTA 01/08/2021, 10:57 AM  [facility] @ [place] [place], [place], [address] Phone: [phone]   Fax:  [phone]  Name: [name] MRN: [id number] Date of Birth: 1965-05-12

## 2021-01-11 ENCOUNTER — Encounter: Payer: Self-pay | Admitting: Physical Therapy

## 2021-01-11 ENCOUNTER — Ambulatory Visit: Payer: BC Managed Care – PPO | Admitting: Physical Therapy

## 2021-01-11 ENCOUNTER — Other Ambulatory Visit: Payer: Self-pay

## 2021-01-11 DIAGNOSIS — C50411 Malignant neoplasm of upper-outer quadrant of right female breast: Secondary | ICD-10-CM

## 2021-01-11 DIAGNOSIS — R600 Localized edema: Secondary | ICD-10-CM

## 2021-01-11 DIAGNOSIS — C50911 Malignant neoplasm of unspecified site of right female breast: Secondary | ICD-10-CM | POA: Diagnosis not present

## 2021-01-11 DIAGNOSIS — Z170 Estrogen receptor positive status [ER+]: Secondary | ICD-10-CM

## 2021-01-11 DIAGNOSIS — M25611 Stiffness of right shoulder, not elsewhere classified: Secondary | ICD-10-CM

## 2021-01-11 DIAGNOSIS — R293 Abnormal posture: Secondary | ICD-10-CM

## 2021-01-11 DIAGNOSIS — Z483 Aftercare following surgery for neoplasm: Secondary | ICD-10-CM

## 2021-01-11 NOTE — Therapy (Signed)
[facility] @ [place], [place], [address] Phone: [phone]   Fax:  [phone]  Physical Therapy Treatment  Patient Details  Name: [name] MRN: [id number] Date of Birth: 09-13-1965 Referring Provider (PT): Dr. Rolm Bookbinder   Encounter Date: 01/11/2021   PT End of Session - 01/11/21 0844     Visit Number 4    Number of Visits 10    Date for PT Re-Evaluation 02/01/21    PT Start Time 0092    PT Stop Time 0923    PT Time Calculation (min) 39 min    Activity Tolerance Patient tolerated treatment well    Behavior During Therapy [facility] for tasks assessed/performed             Past Medical History:  Diagnosis Date   Allergy    Arthritis 2018   hands, left knee   Breast cancer ([place]) 03-27-2020   right breast [facility]   Eczema    Family history of breast cancer    Family history of lung cancer    Family history of prostate cancer    Hypertension     Past Surgical History:  Procedure Laterality Date   ABDOMINAL HYSTERECTOMY     CESAREAN SECTION     KNEE ARTHROSCOPY W/ ACL RECONSTRUCTION Left    KNEE SURGERY     MODIFIED MASTECTOMY Right 12/07/2020   Procedure: RIGHT MODIFIED RADICAL MASTECTOMY;  Surgeon: Rolm Bookbinder, MD;  Location: [facility];  Service: General;  Laterality: Right;   PORTACATH PLACEMENT Left 04/23/2020   Procedure: LEFT SIDE PORT PLACEMENT WITH ULTRASOUND GUIDANCE;  Surgeon: Rolm Bookbinder, MD;  Location: [facility];  Service: General;  Laterality: Left;  230PM START TIME PLEASE ROOM 2 THANKS   TUBAL LIGATION      There were no vitals filed for this visit.   Subjective Assessment - 01/11/21 0846     Subjective A little sore after stretching last session but not bad. This morning the rain has me aching a bit.    Pertinent History Patient was diagnosed on 02/25/2020 with right grade II invasive ductal carcinoma breast cancer. It measures  1.8 cm and is located in the upper outer quadrant. It is functionally triple negative with ER/PR being only 1% and HER2 is negative.She underwent a right modified radical mastectomy (12 of 13 nodes positive) on 12/07/2020. Ki67 is 30%.    Currently in Pain? Yes    Pain Location Shoulder    Pain Orientation Right    Pain Descriptors / Indicators Dull    Multiple Pain Sites No                               [facility] Adult PT Treatment/Exercise - 01/11/21 0001       Shoulder Exercises: Seated   External Rotation --   Seated behind the head stretch for ER: 5x 1 slow breath     Shoulder Exercises: Standing   External Rotation Strengthening;Both;10 reps;Theraband    Theraband Level (Shoulder External Rotation) Level 1 (Yellow)    External Rotation Limitations VC to relax upper traps    Flexion AROM;Right;10 reps    ABduction AROM;Both;10 reps   used mirror for visual   ABduction Limitations Used mirror for visual    Extension Strengthening;Both;10 reps;Theraband    Theraband Level (Shoulder Extension) Level 1 (Yellow)    Extension Limitations VC to relax  her RT arm/holds rigid    Row Strengthening;Both;10 reps;Theraband    Theraband Level (Shoulder Row) Level 2 (Red)    Other Standing Exercises Roll ball up the wall with end range hold 3 sec 10x    Other Standing Exercises Wall slides flex & abd 10x each                     PT Education - 01/11/21 0918     Education Details HEP    Person(s) Educated Patient    Methods Explanation;Demonstration;Verbal cues;Tactile cues;Handout    Comprehension Returned demonstration;Verbalized understanding                 PT Long Term Goals - 01/11/21 0911       PT LONG TERM GOAL #5   Title Patient will verbalize good understanding of lymphedema risk reduction.    Time 4    Period Weeks    Status On-going                   Plan - 01/11/21 0847     Clinical Impression Statement Pt arrives today  with mild Rt shoulder "ache." Pt reports she is getting the feeling back in her Rt arm and a little in her lateral trunk area. Axilla remains numb per pt report. Pt maintaining AROM in RT shoulder. Initiated a few basic scapula strengthening exercises today with tband. Will add to HEP Friday if ok and not too sore. Added external rottaion stretch to HEP, began [place] today.    PT Frequency 2x / week    PT Duration 4 weeks    PT Treatment/Interventions ADLs/Self Care Home Management;Therapeutic exercise;Patient/family education;Manual techniques;Manual lymph drainage;Passive range of motion;Scar mobilization    PT Next Visit Plan Add tband exercises if of next session, re-measure RT shoulder.    PT Home Exercise Plan progress HEP next, gentle shoulder stabilizations/strength as AROM is WNL.    Recommended Other Services Access Code: [email or web address]    Consulted and Agree with Plan of Care Patient             Patient will benefit from skilled therapeutic intervention in order to improve the following deficits and impairments:  Postural dysfunction, Decreased range of motion, Decreased knowledge of precautions, Impaired UE functional use, Pain, Increased edema, Decreased scar mobility, Increased fascial restricitons  Visit Diagnosis: Malignant neoplasm of upper-outer quadrant of right breast in female, estrogen receptor positive ([place])  Abnormal posture  Aftercare following surgery for neoplasm  Stiffness of right shoulder, not elsewhere classified  Localized edema     Problem List Patient Active Problem List   Diagnosis Date Noted   Port-A-Cath in place 12/31/2020   S/P mastectomy, right 12/07/2020   Genetic testing 04/24/2020   Bone metastases ([place]) 04/21/2020   Family history of breast cancer    Family history of lung cancer    Family history of prostate cancer    Malignant neoplasm of upper-outer quadrant of right breast in female, estrogen receptor positive ([place]) 04/03/2020    Essential hypertension, benign 02/11/2013   Other malaise and fatigue 02/11/2013   Tobacco use disorder 02/11/2013   Eczema 02/11/2013   Bacterial vaginosis 02/11/2013    Izaya Netherton, PTA 01/11/2021, 9:26 AM  [facility] @ [place], [place], [address] Phone: [phone]   Fax:  [phone]  Name: IVIS NICOLSON MRN: [id number] Date of Birth: Jan 13, 1966  Access Code: [email or web address]

## 2021-01-13 ENCOUNTER — Ambulatory Visit: Payer: BC Managed Care – PPO | Admitting: Radiation Oncology

## 2021-01-13 DIAGNOSIS — R293 Abnormal posture: Secondary | ICD-10-CM | POA: Diagnosis not present

## 2021-01-13 DIAGNOSIS — Z170 Estrogen receptor positive status [ER+]: Secondary | ICD-10-CM | POA: Insufficient documentation

## 2021-01-13 DIAGNOSIS — Z87891 Personal history of nicotine dependence: Secondary | ICD-10-CM | POA: Diagnosis not present

## 2021-01-13 DIAGNOSIS — C50411 Malignant neoplasm of upper-outer quadrant of right female breast: Secondary | ICD-10-CM | POA: Insufficient documentation

## 2021-01-13 DIAGNOSIS — Z483 Aftercare following surgery for neoplasm: Secondary | ICD-10-CM | POA: Diagnosis not present

## 2021-01-13 DIAGNOSIS — Z9011 Acquired absence of right breast and nipple: Secondary | ICD-10-CM | POA: Diagnosis not present

## 2021-01-13 DIAGNOSIS — R0602 Shortness of breath: Secondary | ICD-10-CM | POA: Diagnosis not present

## 2021-01-13 DIAGNOSIS — R600 Localized edema: Secondary | ICD-10-CM | POA: Diagnosis not present

## 2021-01-13 DIAGNOSIS — Z9221 Personal history of antineoplastic chemotherapy: Secondary | ICD-10-CM | POA: Diagnosis not present

## 2021-01-13 DIAGNOSIS — Z79899 Other long term (current) drug therapy: Secondary | ICD-10-CM | POA: Diagnosis not present

## 2021-01-13 DIAGNOSIS — E860 Dehydration: Secondary | ICD-10-CM | POA: Diagnosis not present

## 2021-01-13 DIAGNOSIS — M25611 Stiffness of right shoulder, not elsewhere classified: Secondary | ICD-10-CM | POA: Diagnosis not present

## 2021-01-13 DIAGNOSIS — C7951 Secondary malignant neoplasm of bone: Secondary | ICD-10-CM | POA: Diagnosis present

## 2021-01-13 DIAGNOSIS — I10 Essential (primary) hypertension: Secondary | ICD-10-CM | POA: Diagnosis not present

## 2021-01-14 ENCOUNTER — Other Ambulatory Visit: Payer: Self-pay

## 2021-01-14 ENCOUNTER — Ambulatory Visit: Payer: BC Managed Care – PPO

## 2021-01-14 ENCOUNTER — Ambulatory Visit
Admission: RE | Admit: 2021-01-14 | Discharge: 2021-01-14 | Disposition: A | Discharge status: Home / Self Care | Payer: BC Managed Care – PPO | Source: Ambulatory Visit | Attending: Radiation Oncology | Admitting: Radiation Oncology

## 2021-01-14 DIAGNOSIS — C50411 Malignant neoplasm of upper-outer quadrant of right female breast: Secondary | ICD-10-CM | POA: Diagnosis not present

## 2021-01-14 NOTE — Progress Notes (Signed)
Pt here for patient teaching.  Pt given Radiation and You booklet, skin care instructions, Alra deodorant, and Radiaplex gel.  Reviewed areas of pertinence such as fatigue, hair loss, skin changes, breast tenderness, and breast swelling . Pt able to give teach back of to pat skin and use unscented/gentle soap,apply Radiaplex bid, avoid applying anything to skin within 4 hours of treatment, avoid wearing an under wire bra, and to use an electric razor if they must shave. Pt verbalizes understanding of information given and will contact nursing with any questions or concerns.     Http://rtanswers.org/treatmentinformation/whattoexpect/index  Leilanny Fluitt M. Rosely Fernandez RN, BSN       

## 2021-01-15 ENCOUNTER — Ambulatory Visit: Payer: BC Managed Care – PPO | Admitting: Physical Therapy

## 2021-01-15 ENCOUNTER — Encounter: Payer: Self-pay | Admitting: Physical Therapy

## 2021-01-15 ENCOUNTER — Other Ambulatory Visit: Payer: Self-pay

## 2021-01-15 ENCOUNTER — Ambulatory Visit
Admission: RE | Admit: 2021-01-15 | Discharge: 2021-01-15 | Disposition: A | Discharge status: Home / Self Care | Payer: BC Managed Care – PPO | Source: Ambulatory Visit | Attending: Radiation Oncology | Admitting: Radiation Oncology

## 2021-01-15 DIAGNOSIS — C50411 Malignant neoplasm of upper-outer quadrant of right female breast: Secondary | ICD-10-CM

## 2021-01-15 DIAGNOSIS — M25611 Stiffness of right shoulder, not elsewhere classified: Secondary | ICD-10-CM | POA: Insufficient documentation

## 2021-01-15 DIAGNOSIS — Z483 Aftercare following surgery for neoplasm: Secondary | ICD-10-CM

## 2021-01-15 DIAGNOSIS — Z170 Estrogen receptor positive status [ER+]: Secondary | ICD-10-CM | POA: Insufficient documentation

## 2021-01-15 DIAGNOSIS — R293 Abnormal posture: Secondary | ICD-10-CM | POA: Insufficient documentation

## 2021-01-15 DIAGNOSIS — R600 Localized edema: Secondary | ICD-10-CM

## 2021-01-15 MED ORDER — RADIAPLEXRX EX GEL: Freq: Once | CUTANEOUS | Status: AC

## 2021-01-15 MED ORDER — ALRA NON-METALLIC DEODORANT (RAD-ONC)
1.0000 "application " | Freq: Once | TOPICAL | Status: AC
  Administered 2021-01-15: 1 via TOPICAL

## 2021-01-15 NOTE — Therapy (Signed)
[facility] @ [place], [place], [address] Phone: [phone]   Fax:  [phone]  Physical Therapy Treatment  Patient Details  Name: [name] MRN: [id number] Date of Birth: 09/10/1965 Referring Provider (PT): Dr. Rolm Bookbinder   Encounter Date: 01/15/2021   PT End of Session - 01/15/21 0845     Visit Number 5    Number of Visits 10    Date for PT Re-Evaluation 02/01/21    PT Start Time 0845    PT Stop Time 0923    PT Time Calculation (min) 38 min    Activity Tolerance Patient tolerated treatment well    Behavior During Therapy [facility] for tasks assessed/performed             Past Medical History:  Diagnosis Date   Allergy    Arthritis 2018   hands, left knee   Breast cancer ([place]) 03-27-2020   right breast [facility]   Eczema    Family history of breast cancer    Family history of lung cancer    Family history of prostate cancer    Hypertension     Past Surgical History:  Procedure Laterality Date   ABDOMINAL HYSTERECTOMY     CESAREAN SECTION     KNEE ARTHROSCOPY W/ ACL RECONSTRUCTION Left    KNEE SURGERY     MODIFIED MASTECTOMY Right 12/07/2020   Procedure: RIGHT MODIFIED RADICAL MASTECTOMY;  Surgeon: Rolm Bookbinder, MD;  Location: [facility];  Service: General;  Laterality: Right;   PORTACATH PLACEMENT Left 04/23/2020   Procedure: LEFT SIDE PORT PLACEMENT WITH ULTRASOUND GUIDANCE;  Surgeon: Rolm Bookbinder, MD;  Location: [facility];  Service: General;  Laterality: Left;  230PM START TIME PLEASE ROOM 2 THANKS   TUBAL LIGATION      There were no vitals filed for this visit.   Subjective Assessment - 01/15/21 0848     Subjective No problem with the exercises I did on Monday. had a busy day with errands yesterday and I got some spasms along the left side. Shoulder ( RT) was challenged by the length of time she had to be in radiation. Pt feels fatigued  from  radiation. She understands she may need to rest more either on radiation or or the next.    Pertinent History Patient was diagnosed on 02/25/2020 with right grade II invasive ductal carcinoma breast cancer. It measures 1.8 cm and is located in the upper outer quadrant. It is functionally triple negative with ER/PR being only 1% and HER2 is negative.She underwent a right modified radical mastectomy (12 of 13 nodes positive) on 12/07/2020. Ki67 is 30%.    Currently in Pain? No/denies                [facility] PT Assessment - 01/15/21 0001       AROM   Right Shoulder Extension 60 Degrees    Right Shoulder Flexion 142 Degrees   standing   Right Shoulder ABduction 150 Degrees   standing   Right Shoulder External Rotation 90 Degrees                           [facility] Adult PT Treatment/Exercise - 01/15/21 0001       Shoulder Exercises: Seated   External Rotation --   Seated behind the head stretch for ER: 5x 1 slow breath     Shoulder Exercises: Standing  External Rotation Strengthening;Both;10 reps;Theraband    Theraband Level (Shoulder External Rotation) Level 1 (Yellow)    Flexion AROM;Right;10 reps    ABduction AROM;Both;10 reps   used mirror for visual   ABduction Limitations Used mirror for visual    Extension Strengthening;Both;10 reps;Theraband    Theraband Level (Shoulder Extension) Level 1 (Yellow)    Extension Limitations VC to relax her RT arm/holds rigid    Row Strengthening;Both;10 reps;Theraband    Theraband Level (Shoulder Row) Level 2 (Red)    Other Standing Exercises Roll ball up the wall with end range hold 3 sec 10x    Other Standing Exercises Wall slides flex & abd 10x each   Bil                    PT Education - 01/15/21 0857     Education Details HEP    Person(s) Educated Patient    Methods Explanation;Demonstration;Verbal cues;Handout    Comprehension Returned demonstration;Verbalized understanding                 PT  Long Term Goals - 01/11/21 0911       PT LONG TERM GOAL #5   Title Patient will verbalize good understanding of lymphedema risk reduction.    Time 4    Period Weeks    Status On-going                   Plan - 01/15/21 0857     Clinical Impression Statement Pt arrives to today reporting her Lt trunk feels tight and had some spasms this Am."RT side" is doing well and had no adverse effects from the band exercises last session. Pt has maintained her improved AROM of the RT shoulder, PTA reinforced to keep up her stretches at home as it will mosre than likely tighten up if she doesn't. Pt verbally understood. Basic tband exercises for scap strenght were given to progress HEP. pt was given yellow and red band. pt verbally understands the concept of more is not better when it comes to her band exercises. She will modify frequency to maybe 3-4 x week rather than daily if her fatigue with radiation warrants more rest. Pt began radiation this week and reports being surprised how fatigued she felt the next day. No pain with exercises but fatigue did increase.    PT Frequency 2x / week    PT Duration 4 weeks    PT Treatment/Interventions ADLs/Self Care Home Management;Therapeutic exercise;Patient/family education;Manual techniques;Manual lymph drainage;Passive range of motion;Scar mobilization    PT Next Visit Plan Assess and educate pt on all things lymphedema next session. Take measurements for LTG review.    PT Home Exercise Plan progress HEP next, gentle shoulder stabilizations/strength as AROM is WNL.    Recommended Other Services [email or web address]    Consulted and Agree with Plan of Care Patient             Patient will benefit from skilled therapeutic intervention in order to improve the following deficits and impairments:  Postural dysfunction, Decreased range of motion, Decreased knowledge of precautions, Impaired UE functional use, Pain, Increased edema, Decreased scar mobility, Increased  fascial restricitons  Visit Diagnosis: Malignant neoplasm of upper-outer quadrant of right breast in female, estrogen receptor positive ([place])  Abnormal posture  Aftercare following surgery for neoplasm  Stiffness of right shoulder, not elsewhere classified  Localized edema     Problem List Patient Active Problem List   Diagnosis Date Noted  Port-A-Cath in place 12/31/2020   S/P mastectomy, right 12/07/2020   Genetic testing 04/24/2020   Bone metastases ([place]) 04/21/2020   Family history of breast cancer    Family history of lung cancer    Family history of prostate cancer    Malignant neoplasm of upper-outer quadrant of right breast in female, estrogen receptor positive ([place]) 04/03/2020   Essential hypertension, benign 02/11/2013   Other malaise and fatigue 02/11/2013   Tobacco use disorder 02/11/2013   Eczema 02/11/2013   Bacterial vaginosis 02/11/2013    Dillon Livermore, PTA 01/15/2021, 9:30 AM  [facility] @ [place], [place], [address] Phone: [phone]   Fax:  [phone]  Name: [name] MRN: [id number] Date of Birth: 1965-11-09

## 2021-01-18 ENCOUNTER — Other Ambulatory Visit: Payer: Self-pay

## 2021-01-18 ENCOUNTER — Ambulatory Visit
Admission: RE | Admit: 2021-01-18 | Discharge: 2021-01-18 | Disposition: A | Discharge status: Home / Self Care | Payer: BC Managed Care – PPO | Source: Ambulatory Visit | Attending: Radiation Oncology | Admitting: Radiation Oncology

## 2021-01-18 DIAGNOSIS — C50411 Malignant neoplasm of upper-outer quadrant of right female breast: Secondary | ICD-10-CM | POA: Diagnosis not present

## 2021-01-19 ENCOUNTER — Ambulatory Visit
Admission: RE | Admit: 2021-01-19 | Discharge: 2021-01-19 | Disposition: A | Discharge status: Home / Self Care | Payer: BC Managed Care – PPO | Source: Ambulatory Visit | Attending: Radiation Oncology | Admitting: Radiation Oncology

## 2021-01-19 ENCOUNTER — Telehealth: Payer: Self-pay

## 2021-01-19 DIAGNOSIS — C50411 Malignant neoplasm of upper-outer quadrant of right female breast: Secondary | ICD-10-CM | POA: Diagnosis not present

## 2021-01-19 NOTE — Telephone Encounter (Signed)
Patient notified of completion of Disability Medical Leave Forms. Fax Transmission Confirmation received. Copy placed at Registration Desk for pick up as requested.

## 2021-01-20 ENCOUNTER — Other Ambulatory Visit: Payer: Self-pay

## 2021-01-20 ENCOUNTER — Ambulatory Visit: Payer: BC Managed Care – PPO

## 2021-01-20 ENCOUNTER — Ambulatory Visit
Admission: RE | Admit: 2021-01-20 | Discharge: 2021-01-20 | Disposition: A | Discharge status: Home / Self Care | Payer: BC Managed Care – PPO | Source: Ambulatory Visit | Attending: Radiation Oncology | Admitting: Radiation Oncology

## 2021-01-20 DIAGNOSIS — Z170 Estrogen receptor positive status [ER+]: Secondary | ICD-10-CM

## 2021-01-20 DIAGNOSIS — R600 Localized edema: Secondary | ICD-10-CM

## 2021-01-20 DIAGNOSIS — C50411 Malignant neoplasm of upper-outer quadrant of right female breast: Secondary | ICD-10-CM | POA: Diagnosis not present

## 2021-01-20 DIAGNOSIS — Z483 Aftercare following surgery for neoplasm: Secondary | ICD-10-CM

## 2021-01-20 DIAGNOSIS — R293 Abnormal posture: Secondary | ICD-10-CM

## 2021-01-20 DIAGNOSIS — M25611 Stiffness of right shoulder, not elsewhere classified: Secondary | ICD-10-CM

## 2021-01-20 NOTE — Patient Instructions (Signed)
SHOULDER: Flexion - Supine (Cane)        Cancer Rehab [phone]    Hold cane in both hands. Raise arms up overhead. Do not allow back to arch. Hold _5__ seconds. Do __5-10__ times; __1-2__ times a day.     Shoulder Blade Stretch    Clasp fingers behind head with elbows touching in front of face. Pull elbows back while pressing shoulder blades together. Relax and hold as tolerated, can place pillow under elbow here for comfort as needed and to allow for prolonged stretch.  Repeat __5__ times. Do __1-2__ sessions per day.

## 2021-01-20 NOTE — Therapy (Signed)
[facility] @ [place], [place], [address] Phone: [phone]   Fax:  [phone]  Physical Therapy Treatment  Patient Details  Name: [name] MRN: [id number] Date of Birth: 1965-06-02 Referring Provider (PT): Dr. Rolm Bookbinder   Encounter Date: 01/20/2021   PT End of Session - 01/20/21 0826     Visit Number 6    Number of Visits 10    Date for PT Re-Evaluation 02/01/21    PT Start Time 0802    PT Stop Time 0852    PT Time Calculation (min) 50 min    Activity Tolerance Patient tolerated treatment well    Behavior During Therapy [facility] for tasks assessed/performed             Past Medical History:  Diagnosis Date   Allergy    Arthritis 2018   hands, left knee   Breast cancer ([place]) 03-27-2020   right breast [facility]   Eczema    Family history of breast cancer    Family history of lung cancer    Family history of prostate cancer    Hypertension     Past Surgical History:  Procedure Laterality Date   ABDOMINAL HYSTERECTOMY     CESAREAN SECTION     KNEE ARTHROSCOPY W/ ACL RECONSTRUCTION Left    KNEE SURGERY     MODIFIED MASTECTOMY Right 12/07/2020   Procedure: RIGHT MODIFIED RADICAL MASTECTOMY;  Surgeon: Rolm Bookbinder, MD;  Location: [facility];  Service: General;  Laterality: Right;   PORTACATH PLACEMENT Left 04/23/2020   Procedure: LEFT SIDE PORT PLACEMENT WITH ULTRASOUND GUIDANCE;  Surgeon: Rolm Bookbinder, MD;  Location: [facility];  Service: General;  Laterality: Left;  230PM START TIME PLEASE ROOM 2 THANKS   TUBAL LIGATION      There were no vitals filed for this visit.   Subjective Assessment - 01/20/21 0805     Subjective .Feeling very fatigued with radiation.  I am keeping up with my exercises.  I think the swelling has been pretty good.  The foam seemed to help. I have some tightness in the elbow at times but the cord in the axilla is gone. I  forgot to do the ABC class. I didn't get the link. I got measured for the sleeve but I haven't received it yet.    Pertinent History Patient was diagnosed on 02/25/2020 with right grade II invasive ductal carcinoma breast cancer. It measures 1.8 cm and is located in the upper outer quadrant. It is functionally triple negative with ER/PR being only 1% and HER2 is negative.She underwent a right modified radical mastectomy (12 of 13 nodes positive) on 12/07/2020. Ki67 is 30%.    Patient Stated Goals Get my arm better    Currently in Pain? Yes    Pain Score 5     Pain Location Knee    Pain Orientation Right    Pain Descriptors / Indicators Tender;Sore    Pain Type Acute pain    Pain Onset 1 to 4 weeks ago    Pain Frequency Intermittent    Aggravating Factors  walking    Pain Relieving Factors rest/non wt bearing    Multiple Pain Sites No                [facility] PT Assessment - 01/20/21 0001       AROM   Right Shoulder Extension 63 Degrees    Right Shoulder Flexion 146 Degrees  Right Shoulder ABduction 165 Degrees                           [facility] Adult PT Treatment/Exercise - 01/20/21 0001       Shoulder Exercises: Supine   Other Supine Exercises Supine wand flex/scaption x5      Manual Therapy   Myofascial Release myofascial release to multiple cords in right axillary, upper arm and antecubital fossa    Manual Lymphatic Drainage (MLD) supraclavicular, right inguinal LN and right axillo inguinal pathway in supine and SL retracing pathways and ending with LN's to decrease swelling                          PT Long Term Goals - 01/20/21 0849       PT LONG TERM GOAL #1   Title Patient will demonstrate she has regained full shoulder ROM and function post operatively compared to baselines.    Time 4    Period Weeks    Status On-going      PT LONG TERM GOAL #2   Title Patient will increase right shoulder active flexion and abduction to >/=   degrees for increased ease reaching and to obtain radiation positioning.    Time 4    Period Weeks    Status Achieved      PT LONG TERM GOAL #3   Title Patient will improve her DASH score to be back to zero for improved overall arm function.    Time 4    Period Weeks    Status On-going      PT LONG TERM GOAL #4   Title Patient will report >/= 50% improvement in her swelling complaint to tolerate daily tasks with greater ease.    Time 4    Period Weeks    Status On-going      PT LONG TERM GOAL #5   Title Patient will verbalize good understanding of lymphedema risk reduction.    Time 4    Period Weeks    Status On-going                   Plan - 01/20/21 0826     Clinical Impression Statement Pt reports feeling very tired from radiation and notes that her shoulder gets intermittently stiff.  She felt her cording had improved as had her swelling but when assessed today she continues with axillary and lateral trunk swelling on right and she will resume using her foam pad.  She had multiple cords noted throughout the right arm and 7 pops were noted with release techniques today.  She is progressing towards goals but has not fully achieved most goals and they are ongoing. updated HEP with supine wand AAROM.    Stability/Clinical Decision Making Stable/Uncomplicated    PT Frequency 2x / week    PT Duration 4 weeks    PT Treatment/Interventions ADLs/Self Care Home Management;Therapeutic exercise;Patient/family education;Manual techniques;Manual lymph drainage;Passive range of motion;Scar mobilization    PT Next Visit Plan Did pt set up ABC, continue shoulder ROM, MFR to cording, MLD to right axillary/lateral trunk and instruct pt, scar mobs prn. Review precautions for lymphedema   PT Home Exercise Plan progress HEP next, gentle shoulder stabilizations/strength as AROM is WNL.    Consulted and Agree with Plan of Care Patient             Patient will benefit from skilled  therapeutic intervention in order to improve the following deficits and impairments:  Postural dysfunction, Decreased range of motion, Decreased knowledge of precautions, Impaired UE functional use, Pain, Increased edema, Decreased scar mobility, Increased fascial restricitons  Visit Diagnosis: Malignant neoplasm of upper-outer quadrant of right breast in female, estrogen receptor positive ([facility])  Abnormal posture  Aftercare following surgery for neoplasm  Stiffness of right shoulder, not elsewhere classified  Localized edema     Problem List Patient Active Problem List   Diagnosis Date Noted   Port-A-Cath in place 12/31/2020   S/P mastectomy, right 12/07/2020   Genetic testing 04/24/2020   Bone metastases ([place]) 04/21/2020   Family history of breast cancer    Family history of lung cancer    Family history of prostate cancer    Malignant neoplasm of upper-outer quadrant of right breast in female, estrogen receptor positive ([place]) 04/03/2020   Essential hypertension, benign 02/11/2013   Other malaise and fatigue 02/11/2013   Tobacco use disorder 02/11/2013   Eczema 02/11/2013   Bacterial vaginosis 02/11/2013    Claris Pong, PT 01/20/2021, 9:36 AM  [facility] @ [place], [place], [address] Phone: [phone]   Fax:  [phone]  Name: HIAWATHA DRESSEL MRN: [id number] Date of Birth: 12-23-1965

## 2021-01-21 ENCOUNTER — Ambulatory Visit
Admission: RE | Admit: 2021-01-21 | Discharge: 2021-01-21 | Disposition: A | Discharge status: Home / Self Care | Payer: BC Managed Care – PPO | Source: Ambulatory Visit | Attending: Radiation Oncology | Admitting: Radiation Oncology

## 2021-01-21 DIAGNOSIS — C50411 Malignant neoplasm of upper-outer quadrant of right female breast: Secondary | ICD-10-CM | POA: Diagnosis not present

## 2021-01-22 ENCOUNTER — Other Ambulatory Visit: Payer: Self-pay

## 2021-01-22 ENCOUNTER — Ambulatory Visit
Admission: RE | Admit: 2021-01-22 | Discharge: 2021-01-22 | Disposition: A | Discharge status: Home / Self Care | Payer: BC Managed Care – PPO | Source: Ambulatory Visit | Attending: Radiation Oncology | Admitting: Radiation Oncology

## 2021-01-22 ENCOUNTER — Telehealth: Payer: Self-pay

## 2021-01-22 ENCOUNTER — Ambulatory Visit: Payer: BC Managed Care – PPO

## 2021-01-22 DIAGNOSIS — Z170 Estrogen receptor positive status [ER+]: Secondary | ICD-10-CM

## 2021-01-22 DIAGNOSIS — R293 Abnormal posture: Secondary | ICD-10-CM

## 2021-01-22 DIAGNOSIS — M25611 Stiffness of right shoulder, not elsewhere classified: Secondary | ICD-10-CM

## 2021-01-22 DIAGNOSIS — R600 Localized edema: Secondary | ICD-10-CM

## 2021-01-22 DIAGNOSIS — Z483 Aftercare following surgery for neoplasm: Secondary | ICD-10-CM

## 2021-01-22 DIAGNOSIS — C50411 Malignant neoplasm of upper-outer quadrant of right female breast: Secondary | ICD-10-CM

## 2021-01-22 NOTE — Therapy (Signed)
[facility] @ [address] [place], [place], [address] Phone: [phone]   Fax:  [phone]  Physical Therapy Treatment  Patient Details  Name: [name] MRN: [id number] Date of Birth: [birth year] Referring Provider (PT): Dr. Rolm Bookbinder   Encounter Date: 01/22/2021   PT End of Session - 01/22/21 0804     Visit Number 7    Number of Visits 10    Date for PT Re-Evaluation 02/01/21    PT Start Time 0800    PT Stop Time 0848    PT Time Calculation (min) 48 min    Activity Tolerance Patient tolerated treatment well    Behavior During Therapy [facility] for tasks assessed/performed             Past Medical History:  Diagnosis Date   Allergy    Arthritis 2018   hands, left knee   Breast cancer ([place]) 03-27-2020   right breast [place]   Eczema    Family history of breast cancer    Family history of lung cancer    Family history of prostate cancer    Hypertension     Past Surgical History:  Procedure Laterality Date   ABDOMINAL HYSTERECTOMY     CESAREAN SECTION     KNEE ARTHROSCOPY W/ ACL RECONSTRUCTION Left    KNEE SURGERY     MODIFIED MASTECTOMY Right 12/07/2020   Procedure: RIGHT MODIFIED RADICAL MASTECTOMY;  Surgeon: Rolm Bookbinder, MD;  Location: [facility];  Service: General;  Laterality: Right;   PORTACATH PLACEMENT Left 04/23/2020   Procedure: LEFT SIDE PORT PLACEMENT WITH ULTRASOUND GUIDANCE;  Surgeon: Rolm Bookbinder, MD;  Location: [place] [facility];  Service: General;  Laterality: Left;  230PM START TIME PLEASE ROOM 2 THANKS   TUBAL LIGATION      There were no vitals filed for this visit.   Subjective Assessment - 01/22/21 0800     Subjective My arm loosened up after last visit.  There is a very small open area at the lateral incision and it was a little damp so I put some neosporin on it. Feel some tightness today in the forearm    Pertinent History Patient was  diagnosed on 02/25/2020 with right grade II invasive ductal carcinoma breast cancer. It measures 1.8 cm and is located in the upper outer quadrant. It is functionally triple negative with ER/PR being only 1% and HER2 is negative.She underwent a right modified radical mastectomy (12 of 13 nodes positive) on 12/07/2020. Ki67 is 30%.    Currently in Pain? No/denies    Pain Score 0-No pain                               [facility] Adult PT Treatment/Exercise - 01/22/21 0001       Shoulder Exercises: Pulleys   Flexion 2 minutes    Scaption 1 minute    ABduction 1 minute      Manual Therapy   Manual therapy comments TG soft cut for Right UE cording    Myofascial Release myofascial release to multiple cords in right axillary, upper arm and antecubital fossa    Manual Lymphatic Drainage (MLD) supraclavicular, left axillary and  right inguinal LN and right axillo inguinal pathway into supine and SL to Posterior interaxillary pathwaydecrease swelling    Passive ROM PROM Right shoulder flexion, abuction, scaption, ER with MFR  PT Long Term Goals - 01/20/21 0849       PT LONG TERM GOAL #1   Title Patient will demonstrate she has regained full shoulder ROM and function post operatively compared to baselines.    Time 4    Period Weeks    Status On-going      PT LONG TERM GOAL #2   Title Patient will increase right shoulder active flexion and abduction to >/=  degrees for increased ease reaching and to obtain radiation positioning.    Time 4    Period Weeks    Status Achieved      PT LONG TERM GOAL #3   Title Patient will improve her DASH score to be back to zero for improved overall arm function.    Time 4    Period Weeks    Status On-going      PT LONG TERM GOAL #4   Title Patient will report >/= 50% improvement in her swelling complaint to tolerate daily tasks with greater ease.    Time 4    Period Weeks    Status On-going       PT LONG TERM GOAL #5   Title Patient will verbalize good understanding of lymphedema risk reduction.    Time 4    Period Weeks    Status On-going                   Plan - 01/22/21 0807     Clinical Impression Statement Pt has 1 very small open area at lateral incision which she is putting neosporin on.  We continued MFR techniques and had 5 pops today including one at the lateral trunk.  Performed PROM with VC's required for pt to relax and MLD techniques.  We discussed precautions for lymphedema including skin care, and warning signs of infection, wearing gloves while gardening, using electric razor etc.  Pt verbalized understanding. She notes radiation is getting easier for her to hold her arm in that position.    Stability/Clinical Decision Making Stable/Uncomplicated    Rehab Potential Good    PT Frequency 2x / week    PT Duration 4 weeks    PT Treatment/Interventions ADLs/Self Care Home Management;Therapeutic exercise;Patient/family education;Manual techniques;Manual lymph drainage;Passive range of motion;Scar mobilization    PT Next Visit Plan cont MFR to cording, PROM, MLD to right axillary/trunk, educate on lymphedema    PT Home Exercise Plan progress HEP next, gentle shoulder stabilizations/strength as AROM is WNL.    Consulted and Agree with Plan of Care Patient             Patient will benefit from skilled therapeutic intervention in order to improve the following deficits and impairments:  Postural dysfunction, Decreased range of motion, Decreased knowledge of precautions, Impaired UE functional use, Pain, Increased edema, Decreased scar mobility, Increased fascial restricitons  Visit Diagnosis: Malignant neoplasm of upper-outer quadrant of right breast in female, estrogen receptor positive ([place])  Abnormal posture  Aftercare following surgery for neoplasm  Stiffness of right shoulder, not elsewhere classified  Localized edema     Problem List Patient  Active Problem List   Diagnosis Date Noted   Port-A-Cath in place 12/31/2020   S/P mastectomy, right 12/07/2020   Genetic testing 04/24/2020   Bone metastases ([place]) 04/21/2020   Family history of breast cancer    Family history of lung cancer    Family history of prostate cancer    Malignant neoplasm of upper-outer quadrant of right breast in  female, estrogen receptor positive ([place]) 04/03/2020   Essential hypertension, benign 02/11/2013   Other malaise and fatigue 02/11/2013   Tobacco use disorder 02/11/2013   Eczema 02/11/2013   Bacterial vaginosis 02/11/2013    Claris Pong, PT 01/22/2021, 8:55 AM  [facility] @ [address] [place], [place], [address] Phone: [phone]   Fax:  [phone]  Name: [name] MRN: [id number] Date of Birth: [birth year]

## 2021-01-22 NOTE — Telephone Encounter (Signed)
Notified Patient of completion of [employer] Absence Management Form. Fax Transmission Confirmation received and copy placed at Registration Desk for pick-up as requested.

## 2021-01-25 ENCOUNTER — Other Ambulatory Visit: Payer: Self-pay

## 2021-01-25 ENCOUNTER — Ambulatory Visit: Payer: BC Managed Care – PPO

## 2021-01-25 DIAGNOSIS — C50411 Malignant neoplasm of upper-outer quadrant of right female breast: Secondary | ICD-10-CM | POA: Diagnosis not present

## 2021-01-25 DIAGNOSIS — Z483 Aftercare following surgery for neoplasm: Secondary | ICD-10-CM

## 2021-01-25 DIAGNOSIS — M25611 Stiffness of right shoulder, not elsewhere classified: Secondary | ICD-10-CM

## 2021-01-25 DIAGNOSIS — R293 Abnormal posture: Secondary | ICD-10-CM

## 2021-01-25 DIAGNOSIS — R600 Localized edema: Secondary | ICD-10-CM

## 2021-01-25 NOTE — Therapy (Signed)
[facility] @ [place], [place], [address] Phone: [phone]   Fax:  [phone]  Physical Therapy Treatment  Patient Details  Name: [name] MRN: [id number] Date of Birth: 28-Feb-1966 Referring Provider (PT): Dr. Rolm Bookbinder   Encounter Date: 01/25/2021   PT End of Session - 01/25/21 0807     Visit Number 8    Number of Visits 10    Date for PT Re-Evaluation 02/01/21    PT Start Time 0800    PT Stop Time 5809    PT Time Calculation (min) 54 min    Activity Tolerance Patient tolerated treatment well    Behavior During Therapy [facility] for tasks assessed/performed             Past Medical History:  Diagnosis Date   Allergy    Arthritis 2018   hands, left knee   Breast cancer ([place]) 03-27-2020   right breast [facility]   Eczema    Family history of breast cancer    Family history of lung cancer    Family history of prostate cancer    Hypertension     Past Surgical History:  Procedure Laterality Date   ABDOMINAL HYSTERECTOMY     CESAREAN SECTION     KNEE ARTHROSCOPY W/ ACL RECONSTRUCTION Left    KNEE SURGERY     MODIFIED MASTECTOMY Right 12/07/2020   Procedure: RIGHT MODIFIED RADICAL MASTECTOMY;  Surgeon: Rolm Bookbinder, MD;  Location: [place];  Service: General;  Laterality: Right;   PORTACATH PLACEMENT Left 04/23/2020   Procedure: LEFT SIDE PORT PLACEMENT WITH ULTRASOUND GUIDANCE;  Surgeon: Rolm Bookbinder, MD;  Location: [place];  Service: General;  Laterality: Left;  230PM START TIME PLEASE ROOM 2 THANKS   TUBAL LIGATION      There were no vitals filed for this visit.   Subjective Assessment - 01/25/21 0759     Subjective I am alittle sore this am under my armpit. The cording feels looser.  I exercised this weekend. The spot in my incision seems to be healing.  I feel really itchy.    Pertinent History Patient was diagnosed on 02/25/2020 with  right grade II invasive ductal carcinoma breast cancer. It measures 1.8 cm and is located in the upper outer quadrant. It is functionally triple negative with ER/PR being only 1% and HER2 is negative.She underwent a right modified radical mastectomy (12 of 13 nodes positive) on 12/07/2020. Ki67 is 30%.    Patient Stated Goals Get my arm better    Currently in Pain? Yes    Pain Score 4     Pain Location Axilla    Pain Orientation Right    Pain Descriptors / Indicators Sore    Pain Type Surgical pain    Pain Onset More than a month ago    Pain Frequency Intermittent    Aggravating Factors  reaching    Pain Relieving Factors rest    Multiple Pain Sites Yes    Pain Score 5    Pain Location Knee    Pain Orientation Right    Pain Descriptors / Indicators Sore;Tender    Pain Onset 1 to 4 weeks ago    Aggravating Factors  walking    Pain Relieving Factors rest    Effect of Pain on Daily Activities decrease walking                [facility] PT Assessment - 01/25/21 0001  AROM   Right Shoulder Flexion 152 Degrees    Right Shoulder ABduction 168 Degrees    Right Shoulder External Rotation 99 Degrees                           [facility] Adult PT Treatment/Exercise - 01/25/21 0001       Shoulder Exercises: Supine   Other Supine Exercises Supine wand flex/scaption x5    Other Supine Exercises Aupine AROM flexion,scaption, horizontal abd x 5      Manual Therapy   Manual therapy comments Tg soft is comfortaable    Soft tissue mobilization to right pecs, lats supine and SL, scapular area without lotion secondary to radiation following treatment    Myofascial Release myofascial release to multiple cords in right axillary, upper arm and antecubital fossa    Manual Lymphatic Drainage (MLD) supraclavicular, left axillary and  right inguinal LN and right axillo inguinal pathway gently in supine and SL mainly to Posterior interaxillary pathway avoiding small area of open incison  and pulling on that area to decrease swelling    Passive ROM PROM Right shoulder flexion, abuction, scaption, ER with MFR                          PT Long Term Goals - 01/20/21 0849       PT LONG TERM GOAL #1   Title Patient will demonstrate she has regained full shoulder ROM and function post operatively compared to baselines.    Time 4    Period Weeks    Status On-going      PT LONG TERM GOAL #2   Title Patient will increase right shoulder active flexion and abduction to >/=  degrees for increased ease reaching and to obtain radiation positioning.    Time 4    Period Weeks    Status Achieved      PT LONG TERM GOAL #3   Title Patient will improve her DASH score to be back to zero for improved overall arm function.    Time 4    Period Weeks    Status On-going      PT LONG TERM GOAL #4   Title Patient will report >/= 50% improvement in her swelling complaint to tolerate daily tasks with greater ease.    Time 4    Period Weeks    Status On-going      PT LONG TERM GOAL #5   Title Patient will verbalize good understanding of lymphedema risk reduction.    Time 4    Period Weeks    Status On-going                   Plan - 01/25/21 0807     Clinical Impression Statement Pt continues with 1 small open area at lateral incision. She was advised to have them check it when she goes to radiation.  It does not appear to be infected in any way.  She has hyperpigmentation especially noted at right inferior axillary region and swelling noted in this area as well with fibrosis noted lateral to T incision.. Performed soft tissue without lotion as pt has radiation today, and MFR techniques  with only 1 pop today. Performed MLD today mainly to posterior interaxillary pathway to avoid pressure on sensitive skin and small open area. Started to instruct pt but discontinued secondary to concerns of pt going over that area and stayed  with Posterior anastomosis bu PT. good  increase in shoulder AROM today.    Stability/Clinical Decision Making Stable/Uncomplicated    Rehab Potential Good    PT Frequency 2x / week    PT Duration 4 weeks    PT Treatment/Interventions ADLs/Self Care Home Management;Therapeutic exercise;Patient/family education;Manual techniques;Manual lymph drainage;Passive range of motion;Scar mobilization    PT Next Visit Plan cont MFR to cording, PROM, MLD to right axillary/trunk and teach pt, educate on lymphedema    PT Home Exercise Plan progress HEP next, gentle shoulder stabilizations/strength as AROM is WNL.    Consulted and Agree with Plan of Care Patient             Patient will benefit from skilled therapeutic intervention in order to improve the following deficits and impairments:  Postural dysfunction, Decreased range of motion, Decreased knowledge of precautions, Impaired UE functional use, Pain, Increased edema, Decreased scar mobility, Increased fascial restricitons  Visit Diagnosis: Malignant neoplasm of upper-outer quadrant of right breast in female, estrogen receptor positive ([place])  Abnormal posture  Aftercare following surgery for neoplasm  Stiffness of right shoulder, not elsewhere classified  Localized edema     Problem List Patient Active Problem List   Diagnosis Date Noted   Port-A-Cath in place 12/31/2020   S/P mastectomy, right 12/07/2020   Genetic testing 04/24/2020   Bone metastases ([place]) 04/21/2020   Family history of breast cancer    Family history of lung cancer    Family history of prostate cancer    Malignant neoplasm of upper-outer quadrant of right breast in female, estrogen receptor positive ([place]) 04/03/2020   Essential hypertension, benign 02/11/2013   Other malaise and fatigue 02/11/2013   Tobacco use disorder 02/11/2013   Eczema 02/11/2013   Bacterial vaginosis 02/11/2013    Claris Pong, PT 01/25/2021, 9:01 AM  [facility] @  [address] [place], [place], [address] Phone: [phone]   Fax:  [phone]  Name: [name] MRN: [id number] Date of Birth: [birth year]

## 2021-01-26 ENCOUNTER — Inpatient Hospital Stay: Payer: BC Managed Care – PPO

## 2021-01-26 ENCOUNTER — Other Ambulatory Visit: Payer: Self-pay | Admitting: Oncology

## 2021-01-26 ENCOUNTER — Inpatient Hospital Stay: Payer: BC Managed Care – PPO | Attending: Oncology | Admitting: Physician Assistant

## 2021-01-26 ENCOUNTER — Telehealth: Payer: Self-pay

## 2021-01-26 ENCOUNTER — Ambulatory Visit
Admission: RE | Admit: 2021-01-26 | Discharge: 2021-01-26 | Disposition: A | Discharge status: Home / Self Care | Payer: BC Managed Care – PPO | Source: Ambulatory Visit | Attending: Radiation Oncology | Admitting: Radiation Oncology

## 2021-01-26 VITALS — BP 135/85 | HR 88 | Temp 97.9°F | Resp 18

## 2021-01-26 DIAGNOSIS — R17 Unspecified jaundice: Secondary | ICD-10-CM

## 2021-01-26 DIAGNOSIS — C7951 Secondary malignant neoplasm of bone: Secondary | ICD-10-CM | POA: Diagnosis not present

## 2021-01-26 DIAGNOSIS — R0602 Shortness of breath: Secondary | ICD-10-CM | POA: Insufficient documentation

## 2021-01-26 DIAGNOSIS — C50411 Malignant neoplasm of upper-outer quadrant of right female breast: Secondary | ICD-10-CM

## 2021-01-26 DIAGNOSIS — M25611 Stiffness of right shoulder, not elsewhere classified: Secondary | ICD-10-CM | POA: Insufficient documentation

## 2021-01-26 DIAGNOSIS — Z483 Aftercare following surgery for neoplasm: Secondary | ICD-10-CM | POA: Insufficient documentation

## 2021-01-26 DIAGNOSIS — Z170 Estrogen receptor positive status [ER+]: Secondary | ICD-10-CM

## 2021-01-26 DIAGNOSIS — Z79899 Other long term (current) drug therapy: Secondary | ICD-10-CM | POA: Insufficient documentation

## 2021-01-26 DIAGNOSIS — Z95828 Presence of other vascular implants and grafts: Secondary | ICD-10-CM

## 2021-01-26 DIAGNOSIS — Z9011 Acquired absence of right breast and nipple: Secondary | ICD-10-CM | POA: Insufficient documentation

## 2021-01-26 DIAGNOSIS — E860 Dehydration: Secondary | ICD-10-CM | POA: Insufficient documentation

## 2021-01-26 DIAGNOSIS — R293 Abnormal posture: Secondary | ICD-10-CM | POA: Insufficient documentation

## 2021-01-26 DIAGNOSIS — Z87891 Personal history of nicotine dependence: Secondary | ICD-10-CM | POA: Insufficient documentation

## 2021-01-26 DIAGNOSIS — I10 Essential (primary) hypertension: Secondary | ICD-10-CM | POA: Insufficient documentation

## 2021-01-26 DIAGNOSIS — Z9221 Personal history of antineoplastic chemotherapy: Secondary | ICD-10-CM | POA: Insufficient documentation

## 2021-01-26 DIAGNOSIS — R600 Localized edema: Secondary | ICD-10-CM | POA: Insufficient documentation

## 2021-01-26 LAB — CBC WITH DIFFERENTIAL/PLATELET
Abs Immature Granulocytes: 0.01 10*3/uL (ref 0.00–0.07)
Basophils Absolute: 0 10*3/uL (ref 0.0–0.1)
Basophils Relative: 1 %
Eosinophils Absolute: 0.1 10*3/uL (ref 0.0–0.5)
Eosinophils Relative: 2 %
HCT: 32.6 % — ABNORMAL LOW (ref 36.0–46.0)
Hemoglobin: 10.4 g/dL — ABNORMAL LOW (ref 12.0–15.0)
Immature Granulocytes: 0 %
Lymphocytes Relative: 28 %
Lymphs Abs: 0.8 10*3/uL (ref 0.7–4.0)
MCH: 32.4 pg (ref 26.0–34.0)
MCHC: 31.9 g/dL (ref 30.0–36.0)
MCV: 101.6 fL — ABNORMAL HIGH (ref 80.0–100.0)
Monocytes Absolute: 0.2 10*3/uL (ref 0.1–1.0)
Monocytes Relative: 7 %
Neutro Abs: 1.9 10*3/uL (ref 1.7–7.7)
Neutrophils Relative %: 62 %
Platelets: 268 10*3/uL (ref 150–400)
RBC: 3.21 MIL/uL — ABNORMAL LOW (ref 3.87–5.11)
RDW: 16.8 % — ABNORMAL HIGH (ref 11.5–15.5)
WBC: 3.1 10*3/uL — ABNORMAL LOW (ref 4.0–10.5)
nRBC: 0 % (ref 0.0–0.2)

## 2021-01-26 LAB — URINALYSIS, COMPLETE (UACMP) WITH MICROSCOPIC
Glucose, UA: NEGATIVE mg/dL
Ketones, ur: NEGATIVE mg/dL
Nitrite: NEGATIVE
Protein, ur: NEGATIVE mg/dL
Specific Gravity, Urine: 1.016 (ref 1.005–1.030)
pH: 5 (ref 5.0–8.0)

## 2021-01-26 LAB — COMPREHENSIVE METABOLIC PANEL
ALT: 36 U/L (ref 0–44)
AST: 26 U/L (ref 15–41)
Albumin: 3.4 g/dL — ABNORMAL LOW (ref 3.5–5.0)
Alkaline Phosphatase: 315 U/L — ABNORMAL HIGH (ref 38–126)
Anion gap: 9 (ref 5–15)
BUN: 13 mg/dL (ref 6–20)
CO2: 22 mmol/L (ref 22–32)
Calcium: 10.4 mg/dL — ABNORMAL HIGH (ref 8.9–10.3)
Chloride: 106 mmol/L (ref 98–111)
Creatinine, Ser: 0.87 mg/dL (ref 0.44–1.00)
GFR, Estimated: >60 mL/min (ref >60)
Glucose, Bld: 122 mg/dL — ABNORMAL HIGH (ref 70–99)
Potassium: 3.9 mmol/L (ref 3.5–5.1)
Sodium: 137 mmol/L (ref 135–145)
Total Bilirubin: 7.3 mg/dL (ref 0.3–1.2)
Total Protein: 8.1 g/dL (ref 6.5–8.1)

## 2021-01-26 LAB — ACETAMINOPHEN LEVEL: Acetaminophen (Tylenol), Serum: <10 ug/mL — ABNORMAL LOW (ref 10–30)

## 2021-01-26 LAB — HEPATITIS PANEL, ACUTE
HCV Ab: NONREACTIVE
Hep A IgM: NONREACTIVE
Hep B C IgM: NONREACTIVE
Hepatitis B Surface Ag: NONREACTIVE

## 2021-01-26 LAB — T4, FREE: Free T4: 0.9 ng/dL (ref 0.61–1.12)

## 2021-01-26 MED ORDER — CHOLESTYRAMINE 4 G PO PACK: 4.0000 g | PACK | Freq: Two times a day (BID) | ORAL | 0 refills | Status: DC

## 2021-01-26 MED ORDER — HEPARIN SOD (PORK) LOCK FLUSH 100 UNIT/ML IV SOLN: 500.0000 [IU] | Freq: Once | INTRAVENOUS | Status: DC

## 2021-01-26 MED ORDER — SODIUM CHLORIDE 0.9% FLUSH: 10.0000 mL | Freq: Once | INTRAVENOUS | Status: DC

## 2021-01-26 NOTE — Telephone Encounter (Signed)
This nurse made an appointment for the patient to get an ultrasound of the RUQ on 01/28/2021. This nurse called the patient and informed her of the upcoming appointment. Patient had no questions or concerns regarding appointment.

## 2021-01-26 NOTE — Progress Notes (Addendum)
Symptom Management Consult note [place]    Patient Care Team: Zanard, Bernadene Bell, MD as PCP - General (Family Medicine) Mauro Kaufmann, RN as Oncology Nurse Navigator Rockwell Germany, RN as Oncology Nurse Navigator Rolm Bookbinder, MD as Consulting Physician (General Surgery) Magrinat, Virgie Dad, MD as Consulting Physician (Oncology) Kyung Rudd, MD as Consulting Physician (Radiation Oncology) Cheri Fowler, MD as Consulting Physician (Obstetrics and Gynecology) Elsie Saas, MD as Consulting Physician (Orthopedic Surgery) Juanita Craver, MD as Consulting Physician (Gastroenterology)    Name of the patient: [name]  [id number]  15-Feb-1966   Date of visit: 01/26/2021    Chief complaint/ Reason for visit- itching, and dark urine  Oncology History  Malignant neoplasm of upper-outer quadrant of right breast in female, estrogen receptor positive ([place])  03/31/2020 Cancer Staging   Staging form: Breast, AJCC 8th Edition - Pathologic stage from 03/31/2020: Stage IV (cM1) - Signed by Gardenia Phlegm, NP on 05/01/2020    04/03/2020 Initial Diagnosis   Malignant neoplasm of upper-outer quadrant of right breast in female, estrogen receptor positive ([place])   04/08/2020 Cancer Staging   Staging form: Breast, AJCC 8th Edition - Clinical stage from 04/08/2020: Stage IIB (cT1c, cN1(f), cM0, G2, ER-, PR-, HER2-) - Signed by Chauncey Cruel, MD on 04/08/2020    04/24/2020 - 10/02/2020 Chemotherapy          04/24/2020 Genetic Testing   Negative genetic testing:  No pathogenic variants detected on the [facility] CustomNext-Cancer + RNAinsight panel. The report date is 04/24/2020.   The CustomNext-Cancer+RNAinsight panel offered by [facility] includes sequencing and rearrangement analysis for the following 47 genes:  APC, ATM, AXIN2, BARD1, BMPR1A, BRCA1, BRCA2, BRIP1, CDH1, CDK4, CDKN2A, CHEK2, DICER1, EPCAM, GREM1, HOXB13, MEN1, MLH1, MSH2, MSH3, MSH6, MUTYH,  NBN, NF1, NF2, NTHL1, PALB2, PMS2, POLD1, POLE, PTEN, RAD51C, RAD51D, RECQL, RET, SDHA, SDHAF2, SDHB, SDHC, SDHD, SMAD4, SMARCA4, STK11, TP53, TSC1, TSC2, and VHL.  RNA data is routinely analyzed for use in variant interpretation for all genes.     Current Therapy: abemaciclib and concurrent radiation  Interval history- [name] is a 55 yo female with above stated history presents to [facility] today with chief complaint of itching and dark urine. Patient reports pruritis was first noticed on her back on x 11 days ago which is when she started her radiation therapy. She states it has since spread to her entire body and is constant. She wakes up during the night itching because it is so severe. She tried taking benadryl and Claritin without any symptom improvement. Patient reports noticing her urine is dark x 8 days ago. She describes her urine as being orange in color. She is staying well hydrated, drinking approximately 5 bottles of water daily as well some Gatorade. She denies any associated dysuria, urinary frequency, gross hematuria. Patient also reports early satiety and noticing her eyes appear yellow x 1 week ago. She admits to early satiety over the last week. Patient does not drink alcohol. She takes tylenol and ibuprofen for pain PRN, does not take it daily and reports only taking one dose per day. She admits to night sweats, this is chronic and unchanged today. She admits to normal bowel movements daily, last went today. Abdominal surgical history includes hysterectomy. Also reports at therapy recently it was noticed she has an open area of her incision. She has been applying bacitracin, denies associated pain or purulent drainage. She denies fever, chills, weight loss, abdominal  pain, nausea, vomiting, black tarry stool, hematochezia, hemoptysis, rash.  Chart review shows patient had CT abdomen pelvis with contrast 12/29/2020.  No findings for hepatic metastatic disease, gallbladder unremarkable,  no common bile duct dilation, pancreas was normal stomach and bowels were normal as well.  ROS  All other systems are reviewed and are negative for acute change except as noted in the HPI.    No Known Allergies   Past Medical History:  Diagnosis Date   Allergy    Arthritis 2018   hands, left knee   Breast cancer ([place]) 03-27-2020   right breast IMC   Eczema    Family history of breast cancer    Family history of lung cancer    Family history of prostate cancer    Hypertension      Past Surgical History:  Procedure Laterality Date   ABDOMINAL HYSTERECTOMY     CESAREAN SECTION     KNEE ARTHROSCOPY W/ ACL RECONSTRUCTION Left    KNEE SURGERY     MODIFIED MASTECTOMY Right 12/07/2020   Procedure: RIGHT MODIFIED RADICAL MASTECTOMY;  Surgeon: Rolm Bookbinder, MD;  Location: [place];  Service: General;  Laterality: Right;   PORTACATH PLACEMENT Left 04/23/2020   Procedure: LEFT SIDE PORT PLACEMENT WITH ULTRASOUND GUIDANCE;  Surgeon: Rolm Bookbinder, MD;  Location: [place];  Service: General;  Laterality: Left;  230PM START TIME PLEASE ROOM 2 THANKS   TUBAL LIGATION      Social History   Socioeconomic History   Marital status: Divorced    Spouse name: Not on file   Number of children: Not on file   Years of education: Not on file   Highest education level: Not on file  Occupational History   Not on file  Tobacco Use   Smoking status: Former    Packs/day: 0.25    Years: 36.00    Pack years: 9.00    Types: Cigarettes    Quit date: 04/22/2017    Years since quitting: 3.7   Smokeless tobacco: Never   Tobacco comments:    She is trying to quit.    Substance and Sexual Activity   Alcohol use: Yes    Alcohol/week: 3.0 standard drinks    Types: 3 Glasses of wine per week    Comment: Occasional    Drug use: Never   Sexual activity: Yes    Partners: Male    Birth control/protection: Surgical  Other Topics Concern   Not on file   Social History Narrative   Marital Status: Divorced    Children:  Sons (2)    Pets: None   Living Situation: Lives alone   Occupation: Wellsite geologist (Financial trader)     Education: Programmer, systems    Tobacco Use/Exposure:  She has smoked as much 1 ppd.  She has smoked for > 20 years.  She is trying to quit and is down to about 3-5 cigs per day.     Alcohol Use: 4 days/months  (3 drinks)    Drug Use:  None   Diet:  Regular   Exercise:  Walking/ Zumba/Basketball, Biking   Hobbies: Dancing/ Cooking                Social Determinants of Health   Financial Resource Strain: Not on file  Food Insecurity: No Food Insecurity   Worried About Charity fundraiser in the Last Year: Never true   Ran Out of Food in the Last Year:  Never true  Transportation Needs: No Transportation Needs   Lack of Transportation (Medical): No   Lack of Transportation (Non-Medical): No  Physical Activity: Not on file  Stress: Not on file  Social Connections: Not on file  Intimate Partner Violence: Not on file    Family History  Problem Relation Age of Onset   Breast cancer Mother 110   Arthritis Mother    Diabetes Father    Kidney disease Father    Hypertension Sister    Heart disease Maternal Grandmother    Breast cancer Paternal Grandmother        dx >50   Heart disease Maternal Aunt    Breast cancer Maternal Aunt 77   Other Maternal Aunt        brain tumor (not cancerous)   Lung cancer Maternal Aunt    Prostate cancer Cousin 56       localized     Current Outpatient Medications:    cholestyramine (QUESTRAN) 4 g packet, Take 1 packet (4 g total) by mouth 2 (two) times daily for 5 days., Disp: 10 packet, Rfl: 0   abemaciclib (VERZENIO) 150 MG tablet, Take 1 tablet (150 mg total) by mouth 2 (two) times daily. Swallow tablets whole. Do not chew, crush, or split tablets before swallowing. Start 10/13/2020, Disp: 56 tablet, Rfl: 6   acetaminophen (TYLENOL) 500 MG tablet, Take 1,000 mg by  mouth every 6 (six) hours as needed for moderate pain., Disp: , Rfl:    Boric Acid Vaginal 600 MG SUPP, Place 600 mg vaginally daily as needed (yeast infections)., Disp: , Rfl:    Cholecalciferol (VITAMIN D) 125 MCG (5000 UT) CAPS, Take 5,000 Units by mouth daily., Disp: , Rfl:    clobetasol ointment (TEMOVATE) 6.20 %, Apply 1 application topically 2 (two) times daily as needed (rash/itching)., Disp: , Rfl:    Cyanocobalamin (B-12) 5000 MCG CAPS, Take 5,000 mcg by mouth daily., Disp: , Rfl:    loperamide (IMODIUM) 2 MG capsule, Take 2 tablets after first diarrheal bowel movement, then one tablet after each following diarrheal movement; maximum 6 tablets/ day, Disp: 60 capsule, Rfl: 6   methocarbamol (ROBAXIN) 500 MG tablet, Take 1 tablet (500 mg total) by mouth every 6 (six) hours as needed for muscle spasms., Disp: 30 tablet, Rfl: 1   prochlorperazine (COMPAZINE) 10 MG tablet, Take 1 tablet (10 mg total) by mouth every 6 (six) hours as needed for nausea or vomiting., Disp: 30 tablet, Rfl: 4   propranolol (INDERAL) 10 MG tablet, Take 10 mg by mouth 2 (two) times daily., Disp: , Rfl:    spironolactone (ALDACTONE) 25 MG tablet, Take 1 tablet by mouth 2 (two) times daily., Disp: , Rfl:   Current Facility-Administered Medications:    heparin lock flush 100 unit/mL, 500 Units, Intracatheter, Once, Magrinat, Virgie Dad, MD   sodium chloride flush (NS) 0.9 % injection 10 mL, 10 mL, Intracatheter, Once, Magrinat, Virgie Dad, MD  PHYSICAL EXAM: ECOG FS:1 - Symptomatic but completely ambulatory    Vitals:   01/26/21 1132  BP: 135/85  Pulse: 88  Resp: 18  Temp: 97.9 F (36.6 C)  TempSrc: Oral  SpO2: 100%   Physical Exam Vitals and nursing note reviewed.  Constitutional:      General: She is not in acute distress.    Appearance: She is not ill-appearing.  HENT:     Head: Normocephalic and atraumatic.     Right Ear: External ear normal.     Left Ear:  External ear normal.     Nose: Nose normal.      Mouth/Throat:     Mouth: Mucous membranes are moist.     Pharynx: Oropharynx is clear.     Comments: Gums pale appearing Eyes:     General: Scleral icterus present.        Right eye: No discharge.        Left eye: No discharge.     Extraocular Movements: Extraocular movements intact.     Conjunctiva/sclera: Conjunctivae normal.     Pupils: Pupils are equal, round, and reactive to light.  Neck:     Vascular: No JVD.  Cardiovascular:     Rate and Rhythm: Normal rate and regular rhythm.     Pulses: Normal pulses.          Radial pulses are 2+ on the right side and 2+ on the left side.     Heart sounds: Normal heart sounds.  Pulmonary:     Comments: Lungs clear to auscultation in all fields. Symmetric chest rise. No wheezing, rales, or rhonchi. Chest:     Comments: Incision from right mastectomy. Under axilla there is a small opening of incision similar to a puncture size wound. No purulent drainage. No surrounding erythema. Not warm to the touch. Abdominal:     General: Bowel sounds are normal.     Palpations: There is no hepatomegaly or splenomegaly.     Tenderness: There is no right CVA tenderness or left CVA tenderness.     Comments: Abdomen is soft, non-distended, and non-tender in all quadrants. No rigidity, no guarding. No peritoneal signs.  Musculoskeletal:        General: Normal range of motion.     Cervical back: Normal range of motion.     Right lower leg: No edema.     Left lower leg: No edema.  Skin:    General: Skin is warm and dry.     Capillary Refill: Capillary refill takes less than 2 seconds.     Comments: Excoriations on bilateral forearms without open wounds. Skin is dry.  Neurological:     Mental Status: She is oriented to person, place, and time.     GCS: GCS eye subscore is 4. GCS verbal subscore is 5. GCS motor subscore is 6.     Comments: Fluent speech, no facial droop.  Psychiatric:        Behavior: Behavior normal.       LABORATORY DATA: I  have reviewed the data as listed CBC Latest Ref Rng & Units 01/26/2021 12/31/2020 12/03/2020  WBC 4.0 - 10.5 K/uL 3.1(L) 4.6 3.6(L)  Hemoglobin 12.0 - 15.0 g/dL 10.4(L) 9.0(L) 9.1(L)  Hematocrit 36.0 - 46.0 % 32.6(L) 28.2(L) 28.4(L)  Platelets 150 - 400 K/uL 268 252 212     CMP Latest Ref Rng & Units 01/26/2021 12/31/2020 12/03/2020  Glucose 70 - 99 mg/dL 122(H) 96 81  BUN 6 - 20 mg/dL '13 16 11  ' Creatinine 0.44 - 1.00 mg/dL 0.87 0.93 0.88  Sodium 135 - 145 mmol/L 137 139 140  Potassium 3.5 - 5.1 mmol/L 3.9 4.0 3.7  Chloride 98 - 111 mmol/L 106 107 108  CO2 22 - 32 mmol/L '22 25 23  ' Calcium 8.9 - 10.3 mg/dL 10.4(H) 10.0 9.8  Total Protein 6.5 - 8.1 g/dL 8.1 8.4(H) 7.7  Total Bilirubin 0.3 - 1.2 mg/dL 7.3(HH) 0.4 <0.2(L)  Alkaline Phos 38 - 126 U/L 315(H) 69 69  AST 15 - 41 U/L 26  16 18  ALT 0 - 44 U/L 36 11 11       RADIOGRAPHIC STUDIES: I have personally reviewed the radiological images as listed and agreed with the findings in the report. No images are attached to the encounter. CT Chest W Contrast  Result Date: 12/30/2020 CLINICAL DATA:  Restaging metastatic breast cancer the. EXAM: CT CHEST, ABDOMEN, AND PELVIS WITH CONTRAST TECHNIQUE: Multidetector CT imaging of the chest, abdomen and pelvis was performed following the standard protocol during bolus administration of intravenous contrast. CONTRAST:  59m OMNIPAQUE IOHEXOL 350 MG/ML SOLN COMPARISON:  Chest CT 10/07/2020 the heart is normal in size. No pericardial effusion. The aorta is normal in caliber. No dissection or significant atherosclerotic calcifications. The branch vessels are patent. FINDINGS: CT CHEST FINDINGS Cardiovascular: The heart is normal in size. No pericardial effusion. The aorta is normal in caliber. No dissection. The branch vessels are patent. Mediastinum/Nodes: Small scattered mediastinal and hilar lymph nodes but no mass or adenopathy. The esophagus is grossly normal. The thyroid gland is unremarkable and  stable Lungs/Pleura: No worrisome pulmonary lesions or pulmonary nodules to suggest metastatic disease. No acute pulmonary findings. No pleural effusions or pleural nodules. Musculoskeletal: New surgical changes involving the right breast from a right mastectomy and probable radiation changes. The right axillary lymph nodes have also been resected. No findings suspicious for residual or recurrent tumor. Stable appearing diffuse sclerotic metastatic bone disease. There is a lytic lesion in the T8 vertebral body which is slightly larger. It measures approximately 15 mm and previously measured 9.5 mm. CT ABDOMEN PELVIS FINDINGS Hepatobiliary: No findings suspicious for hepatic metastatic disease. The gallbladder is unremarkable. No common bile duct dilatation. Pancreas: No mass, inflammation or ductal dilatation. Spleen: Normal size.  No focal lesions. Adrenals/Urinary Tract: Adrenal glands are unremarkable. No renal lesions or hydronephrosis. The bladder is unremarkable. Stomach/Bowel: Stomach, duodenum, small bowel and colon unremarkable. Terminal ileum is normal. The appendix is normal. Vascular/Lymphatic: The aorta and branch vessels are patent. The major venous structures are patent. Moderate retroperitoneal lymphadenopathy the is present. I do not have any prior CT scans of the abdomen for comparison. The largest left-sided retroperitoneal lymph node on image 69/2 measures 3.0 x 2.4 cm. Lower left para-aortic node on image 74/2 measures 12.5 mm. Reproductive: The uterus is surgically absent. Both ovaries are still present and appear normal. Other: No pelvic mass or pelvic adenopathy.  No inguinal adenopathy. Musculoskeletal: Stable appearing sclerotic bone lesions. No definite new or progressive findings in the lumbar spine or pelvis. IMPRESSION: 1. New surgical changes from a right mastectomy and right axillary lymph dissection. No findings suspicious for residual or recurrent tumor. 2. Overall stable diffuse  sclerotic metastatic bone disease. There is a lytic lesion in the T8 vertebral body which is slightly larger. No definite new or progressive findings in the lumbar spine or pelvis. 3. Retroperitoneal lymphadenopathy not previously imaged. Recommend attention on future scans. 4. No findings for pulmonary metastatic disease. Electronically Signed   By: PMarijo SanesM.D.   On: 12/30/2020 16:26   CT Abdomen Pelvis W Contrast  Result Date: 12/30/2020 CLINICAL DATA:  Restaging metastatic breast cancer the. EXAM: CT CHEST, ABDOMEN, AND PELVIS WITH CONTRAST TECHNIQUE: Multidetector CT imaging of the chest, abdomen and pelvis was performed following the standard protocol during bolus administration of intravenous contrast. CONTRAST:  866mOMNIPAQUE IOHEXOL 350 MG/ML SOLN COMPARISON:  Chest CT 10/07/2020 the heart is normal in size. No pericardial effusion. The aorta is normal in caliber. No  dissection or significant atherosclerotic calcifications. The branch vessels are patent. FINDINGS: CT CHEST FINDINGS Cardiovascular: The heart is normal in size. No pericardial effusion. The aorta is normal in caliber. No dissection. The branch vessels are patent. Mediastinum/Nodes: Small scattered mediastinal and hilar lymph nodes but no mass or adenopathy. The esophagus is grossly normal. The thyroid gland is unremarkable and stable Lungs/Pleura: No worrisome pulmonary lesions or pulmonary nodules to suggest metastatic disease. No acute pulmonary findings. No pleural effusions or pleural nodules. Musculoskeletal: New surgical changes involving the right breast from a right mastectomy and probable radiation changes. The right axillary lymph nodes have also been resected. No findings suspicious for residual or recurrent tumor. Stable appearing diffuse sclerotic metastatic bone disease. There is a lytic lesion in the T8 vertebral body which is slightly larger. It measures approximately 15 mm and previously measured 9.5 mm. CT ABDOMEN  PELVIS FINDINGS Hepatobiliary: No findings suspicious for hepatic metastatic disease. The gallbladder is unremarkable. No common bile duct dilatation. Pancreas: No mass, inflammation or ductal dilatation. Spleen: Normal size.  No focal lesions. Adrenals/Urinary Tract: Adrenal glands are unremarkable. No renal lesions or hydronephrosis. The bladder is unremarkable. Stomach/Bowel: Stomach, duodenum, small bowel and colon unremarkable. Terminal ileum is normal. The appendix is normal. Vascular/Lymphatic: The aorta and branch vessels are patent. The major venous structures are patent. Moderate retroperitoneal lymphadenopathy the is present. I do not have any prior CT scans of the abdomen for comparison. The largest left-sided retroperitoneal lymph node on image 69/2 measures 3.0 x 2.4 cm. Lower left para-aortic node on image 74/2 measures 12.5 mm. Reproductive: The uterus is surgically absent. Both ovaries are still present and appear normal. Other: No pelvic mass or pelvic adenopathy.  No inguinal adenopathy. Musculoskeletal: Stable appearing sclerotic bone lesions. No definite new or progressive findings in the lumbar spine or pelvis. IMPRESSION: 1. New surgical changes from a right mastectomy and right axillary lymph dissection. No findings suspicious for residual or recurrent tumor. 2. 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 There is a lytic lesion in the T8 vertebral body which is slightly larger. No definite new or progressive findings in the lumbar spine or pelvis. 3. Retroperitoneal lymphadenopathy not previously imaged. Recommend attention on future scans. 4. No findings for pulmonary metastatic disease. Electronically Signed   By: Marijo Sanes M.D.   On: 12/30/2020 16:26     ASSESSMENT & PLAN: Patient is a 55 y.o. 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 [name].   #)  Pruritis- Patient afebrile, non toxic appearing. Exam with scleral icterus, no abdominal tenderness.  CBC with leukopenia and anemia consistent with baseline and prior labs.  CMP without severe electrolyte derangement, no renal insufficiency, no transaminitis.  CMP is notable for elevated total bilirubin at 7.3 and elevated alk phos at 315.  These are acute changes compared to prior labs.  No history of the same.  Discussed case with oncologist Dr. [name] who agrees symptoms likely related to Abemaciclib and she should discontinue it.  Patient will need repeat labs on Thursday and Monday.  Labs ordered and patient can be seen here in clinic Thursday and then with Dr. [name] on Monday.  Will also order right upper quadrant ultrasound to rule out any gallbladder abnormalities.  Patient is agreeable with plan of care.  Strict ED precautions were discussed.  Hepatitis panel, acetaminophen level and UA were also collected as there was concern  for possible liver etiology of symptoms.  Hepatitis panel still in process.  Acetaminophen level is negative.  Patient has no symptoms of UTI.  UA does have small bilirubin, trace leukocytes and 6-10 WBC with rare bacteria.  We will hold off on starting antibiotics at this time as patient denies any symptoms.  Will ask her next clinic visit if she is having any urinary symptoms.  Patient prescribed cholestyramine for itching ID x 5 days. Discussed side effects to watch for.   #)Wound-patient has what looks like a puncture sized wound on incision of mastectomy.  There is no surrounding signs of infection.  No purulent drainage.  Recommend patient continue to use bacitracin and keep a close eye on wound.  Will recheck again at next clinic visit.  #) Stage IV triple negative breast cancer-patient to follow-up with Dr. [name] Monday after labs.   Visit Diagnosis: 1. Port-A-Cath in place   2. Bone metastases ([place])   3. High bilirubin      Orders Placed This Encounter   Procedures   CBC with Differential/Platelet    Standing Status:   Standing    Number of Occurrences:   1   Urinalysis, Complete w Microscopic Urine, Clean Catch    Standing Status:   Standing    Number of Occurrences:   1   Hepatitis panel, acute    Standing Status:   Standing    Number of Occurrences:   1   Acetaminophen level    Standing Status:   Standing    Number of Occurrences:   1    All questions were answered. The patient knows to call the clinic with any problems, questions or concerns. No barriers to learning was detected.  I have spent a total of 30 minutes minutes of face-to-face and non-face-to-face time, preparing to see the patient, obtaining and/or reviewing separately obtained history, performing a medically appropriate examination, counseling and educating the patient, ordering tests,  documenting clinical information in the electronic health record, and care coordination.     Thank you for allowing me to participate in the care of this patient.    Barrie Folk, PA-C Department of Hematology/Oncology [facility] Phone: [phone]  [phone]    01/26/2021 2:43 PM

## 2021-01-26 NOTE — Patient Instructions (Signed)
STOP taking Abemaciclib (Verzenio) as it might be causing your symptoms.  Cholestyramine is to help with itching. It can cause constipation so if you have problems using the bathroom STOP taking this medicine.   We are scheduling an ultrasound for you. You will get a phone call saying when that is scheduled for.   If you develop fever, nausea, vomiting, abdominal pain or any other concerning symptoms call 911 or go to nearest emergency department.

## 2021-01-26 NOTE — Addendum Note (Signed)
Addended by: Marcell Anger D on: 01/26/2021 03:47 PM   Modules accepted: Orders

## 2021-01-27 ENCOUNTER — Ambulatory Visit: Payer: BC Managed Care – PPO

## 2021-01-27 ENCOUNTER — Other Ambulatory Visit: Payer: Self-pay

## 2021-01-27 ENCOUNTER — Ambulatory Visit
Admission: RE | Admit: 2021-01-27 | Discharge: 2021-01-27 | Disposition: A | Discharge status: Home / Self Care | Payer: BC Managed Care – PPO | Source: Ambulatory Visit | Attending: Radiation Oncology | Admitting: Radiation Oncology

## 2021-01-27 DIAGNOSIS — R600 Localized edema: Secondary | ICD-10-CM

## 2021-01-27 DIAGNOSIS — C50411 Malignant neoplasm of upper-outer quadrant of right female breast: Secondary | ICD-10-CM

## 2021-01-27 DIAGNOSIS — R293 Abnormal posture: Secondary | ICD-10-CM

## 2021-01-27 DIAGNOSIS — Z170 Estrogen receptor positive status [ER+]: Secondary | ICD-10-CM

## 2021-01-27 DIAGNOSIS — M25611 Stiffness of right shoulder, not elsewhere classified: Secondary | ICD-10-CM

## 2021-01-27 DIAGNOSIS — Z483 Aftercare following surgery for neoplasm: Secondary | ICD-10-CM

## 2021-01-27 NOTE — Therapy (Signed)
[facility] @ [address] [place], [place], [address] Phone: [phone]   Fax:  [phone]  Physical Therapy Treatment  Patient Details  Name: [name] MRN: [id number] Date of Birth: February 20, 1966 Referring Provider (PT): Dr. Rolm Bookbinder   Encounter Date: 01/27/2021   PT End of Session - 01/27/21 0802     Visit Number 9    Number of Visits 10    Date for PT Re-Evaluation 02/01/21    PT Start Time 9518    PT Stop Time 0852    PT Time Calculation (min) 58 min    Activity Tolerance Patient tolerated treatment well    Behavior During Therapy [facility] for tasks assessed/performed             Past Medical History:  Diagnosis Date   Allergy    Arthritis 2018   hands, left knee   Breast cancer ([place]) 03-27-2020   right breast [facility]   Eczema    Family history of breast cancer    Family history of lung cancer    Family history of prostate cancer    Hypertension     Past Surgical History:  Procedure Laterality Date   ABDOMINAL HYSTERECTOMY     CESAREAN SECTION     KNEE ARTHROSCOPY W/ ACL RECONSTRUCTION Left    KNEE SURGERY     MODIFIED MASTECTOMY Right 12/07/2020   Procedure: RIGHT MODIFIED RADICAL MASTECTOMY;  Surgeon: Rolm Bookbinder, MD;  Location: [facility];  Service: General;  Laterality: Right;   PORTACATH PLACEMENT Left 04/23/2020   Procedure: LEFT SIDE PORT PLACEMENT WITH ULTRASOUND GUIDANCE;  Surgeon: Rolm Bookbinder, MD;  Location: [facility];  Service: General;  Laterality: Left;  230PM START TIME PLEASE ROOM 2 THANKS   TUBAL LIGATION      There were no vitals filed for this visit.   Subjective Assessment - 01/27/21 0754     Subjective I was itching all over yesterday and they think its my gallbladder.  My eyes are kind of yellow. I don't have any pain in my stomach but my urine is really dark. Sore at right lateral trunk.  I went to the clinic at the cancer  center and showed the the little area at my chest.  They said its OK for me to use neosporin on it.    Pertinent History Patient was diagnosed on 02/25/2020 with right grade II invasive ductal carcinoma breast cancer. It measures 1.8 cm and is located in the upper outer quadrant. It is functionally triple negative with ER/PR being only 1% and HER2 is negative.She underwent a right modified radical mastectomy (12 of 13 nodes positive) on 12/07/2020. Ki67 is 30%.    Patient Stated Goals Get my arm better    Currently in Pain? No/denies   sore to touch   Pain Score 0-No pain    Multiple Pain Sites No                               [facility] Adult PT Treatment/Exercise - 01/27/21 0001       Shoulder Exercises: Supine   Other Supine Exercises Supine wand flex/scaption x5    Other Supine Exercises Supine AROM flexion,scaption, horizontal abd x 5      Manual Therapy   Soft tissue mobilization to right pecs, lats supine and SL, scapular area without lotion secondary to radiation following treatment  Myofascial Release myofascial release to multiple cords in right axillary, upper arm and antecubital fossa    Manual Lymphatic Drainage (MLD) supraclavicular, left axillary and  right inguinal LN and right axillo inguinal pathway gently in supine and SL mainly to Posterior interaxillary pathway avoiding small area of open incison and pulling on that area to decrease swelling. Also added right upper arm lateral, medial to lateral, and lateral arm again reworking pathways and ending with LNs    Passive ROM PROM Right shoulder flexion, abuction, scaption, ER with MFR                          PT Long Term Goals - 01/20/21 0849       PT LONG TERM GOAL #1   Title Patient will demonstrate she has regained full shoulder ROM and function post operatively compared to baselines.    Time 4    Period Weeks    Status On-going      PT LONG TERM GOAL #2   Title Patient will  increase right shoulder active flexion and abduction to >/=  degrees for increased ease reaching and to obtain radiation positioning.    Time 4    Period Weeks    Status Achieved      PT LONG TERM GOAL #3   Title Patient will improve her DASH score to be back to zero for improved overall arm function.    Time 4    Period Weeks    Status On-going      PT LONG TERM GOAL #4   Title Patient will report >/= 50% improvement in her swelling complaint to tolerate daily tasks with greater ease.    Time 4    Period Weeks    Status On-going      PT LONG TERM GOAL #5   Title Patient will verbalize good understanding of lymphedema risk reduction.    Time 4    Period Weeks    Status On-going                   Plan - 01/27/21 0802     Clinical Impression Statement open area at incision is still present but improved.  Pt started off tighter today but was not able to do exercises yesterday.  She loosened up well with manual work and felt looser after treatment.  2 small pops noted with MFR to cording today.  Pt will have [place] of gallbladder tomorrow. Ended with MLD to lateral trunk and right upper arm.  Improved ROM at completion of rx.    Stability/Clinical Decision Making Stable/Uncomplicated    Rehab Potential Good    PT Frequency 2x / week    PT Duration 4 weeks    PT Treatment/Interventions ADLs/Self Care Home Management;Therapeutic exercise;Patient/family education;Manual techniques;Manual lymph drainage;Passive range of motion;Scar mobilization    PT Next Visit Plan Recert next,cont MFR to cording, PROM, MLD to right axillary/trunk and teach pt, educate on lymphedema    PT Home Exercise Plan progress HEP next, gentle shoulder stabilizations/strength as AROM is WNL.    Consulted and Agree with Plan of Care Patient             Patient will benefit from skilled therapeutic intervention in order to improve the following deficits and impairments:  Postural dysfunction, Decreased  range of motion, Decreased knowledge of precautions, Impaired UE functional use, Pain, Increased edema, Decreased scar mobility, Increased fascial restricitons  Visit Diagnosis: Malignant  neoplasm of upper-outer quadrant of right breast in female, estrogen receptor positive ([place])  Abnormal posture  Aftercare following surgery for neoplasm  Stiffness of right shoulder, not elsewhere classified  Localized edema     Problem List Patient Active Problem List   Diagnosis Date Noted   Port-A-Cath in place 12/31/2020   S/P mastectomy, right 12/07/2020   Genetic testing 04/24/2020   Bone metastases ([place]) 04/21/2020   Family history of breast cancer    Family history of lung cancer    Family history of prostate cancer    Malignant neoplasm of upper-outer quadrant of right breast in female, estrogen receptor positive ([place]) 04/03/2020   Essential hypertension, benign 02/11/2013   Other malaise and fatigue 02/11/2013   Tobacco use disorder 02/11/2013   Eczema 02/11/2013   Bacterial vaginosis 02/11/2013    Claris Pong, PT 01/27/2021, 8:57 AM  [facility] @ [address] [place], [place], [address] Phone: [phone]   Fax:  [phone]  Name: [name] MRN: [id number] Date of Birth: [birth year]

## 2021-01-28 ENCOUNTER — Ambulatory Visit (HOSPITAL_COMMUNITY)
Admission: RE | Admit: 2021-01-28 | Discharge: 2021-01-28 | Disposition: A | Discharge status: Home / Self Care | Payer: BC Managed Care – PPO | Source: Ambulatory Visit | Attending: Physician Assistant | Admitting: Physician Assistant

## 2021-01-28 ENCOUNTER — Inpatient Hospital Stay: Payer: BC Managed Care – PPO

## 2021-01-28 ENCOUNTER — Ambulatory Visit
Admission: RE | Admit: 2021-01-28 | Discharge: 2021-01-28 | Disposition: A | Discharge status: Home / Self Care | Payer: BC Managed Care – PPO | Source: Ambulatory Visit | Attending: Radiation Oncology | Admitting: Radiation Oncology

## 2021-01-28 ENCOUNTER — Inpatient Hospital Stay (HOSPITAL_BASED_OUTPATIENT_CLINIC_OR_DEPARTMENT_OTHER): Payer: BC Managed Care – PPO | Admitting: Physician Assistant

## 2021-01-28 VITALS — BP 135/85 | HR 82 | Temp 97.8°F | Resp 20

## 2021-01-28 DIAGNOSIS — Z170 Estrogen receptor positive status [ER+]: Secondary | ICD-10-CM

## 2021-01-28 DIAGNOSIS — R17 Unspecified jaundice: Secondary | ICD-10-CM

## 2021-01-28 DIAGNOSIS — Z95828 Presence of other vascular implants and grafts: Secondary | ICD-10-CM | POA: Diagnosis not present

## 2021-01-28 DIAGNOSIS — C50411 Malignant neoplasm of upper-outer quadrant of right female breast: Secondary | ICD-10-CM

## 2021-01-28 DIAGNOSIS — C7951 Secondary malignant neoplasm of bone: Secondary | ICD-10-CM

## 2021-01-28 DIAGNOSIS — R638 Other symptoms and signs concerning food and fluid intake: Secondary | ICD-10-CM | POA: Diagnosis not present

## 2021-01-28 LAB — COMPREHENSIVE METABOLIC PANEL
ALT: 30 U/L (ref 0–44)
AST: 26 U/L (ref 15–41)
Albumin: 3.2 g/dL — ABNORMAL LOW (ref 3.5–5.0)
Alkaline Phosphatase: 349 U/L — ABNORMAL HIGH (ref 38–126)
Anion gap: 10 (ref 5–15)
BUN: 15 mg/dL (ref 6–20)
CO2: 20 mmol/L — ABNORMAL LOW (ref 22–32)
Calcium: 10 mg/dL (ref 8.9–10.3)
Chloride: 107 mmol/L (ref 98–111)
Creatinine, Ser: 1.2 mg/dL — ABNORMAL HIGH (ref 0.44–1.00)
GFR, Estimated: 54 mL/min — ABNORMAL LOW (ref >60)
Glucose, Bld: 140 mg/dL — ABNORMAL HIGH (ref 70–99)
Potassium: 3.8 mmol/L (ref 3.5–5.1)
Sodium: 137 mmol/L (ref 135–145)
Total Bilirubin: 8.6 mg/dL (ref 0.3–1.2)
Total Protein: 7.7 g/dL (ref 6.5–8.1)

## 2021-01-28 LAB — CBC WITH DIFFERENTIAL/PLATELET
Abs Immature Granulocytes: 0.01 10*3/uL (ref 0.00–0.07)
Basophils Absolute: 0 10*3/uL (ref 0.0–0.1)
Basophils Relative: 1 %
Eosinophils Absolute: 0 10*3/uL (ref 0.0–0.5)
Eosinophils Relative: 2 %
HCT: 30.9 % — ABNORMAL LOW (ref 36.0–46.0)
Hemoglobin: 9.9 g/dL — ABNORMAL LOW (ref 12.0–15.0)
Immature Granulocytes: 0 %
Lymphocytes Relative: 18 %
Lymphs Abs: 0.5 10*3/uL — ABNORMAL LOW (ref 0.7–4.0)
MCH: 32.9 pg (ref 26.0–34.0)
MCHC: 32 g/dL (ref 30.0–36.0)
MCV: 102.7 fL — ABNORMAL HIGH (ref 80.0–100.0)
Monocytes Absolute: 0.3 10*3/uL (ref 0.1–1.0)
Monocytes Relative: 10 %
Neutro Abs: 1.9 10*3/uL (ref 1.7–7.7)
Neutrophils Relative %: 69 %
Platelets: 241 10*3/uL (ref 150–400)
RBC: 3.01 MIL/uL — ABNORMAL LOW (ref 3.87–5.11)
RDW: 16.4 % — ABNORMAL HIGH (ref 11.5–15.5)
WBC: 2.7 10*3/uL — ABNORMAL LOW (ref 4.0–10.5)
nRBC: 0 % (ref 0.0–0.2)

## 2021-01-28 MED ORDER — SODIUM CHLORIDE 0.9% FLUSH
10.0000 mL | Freq: Once | INTRAVENOUS | Status: AC
  Administered 2021-01-28: 12:00:00 10 mL

## 2021-01-28 MED ORDER — ALTEPLASE 2 MG IJ SOLR
2.0000 mg | Freq: Once | INTRAMUSCULAR | Status: AC
  Administered 2021-01-28: 12:00:00 2 mg

## 2021-01-28 MED ORDER — HEPARIN SOD (PORK) LOCK FLUSH 100 UNIT/ML IV SOLN
500.0000 [IU] | Freq: Once | INTRAVENOUS | Status: AC
  Administered 2021-01-28: 14:00:00 500 [IU]

## 2021-01-28 MED ORDER — SODIUM CHLORIDE 0.9 % IV SOLN: Freq: Once | INTRAVENOUS | Status: AC

## 2021-01-28 NOTE — Patient Instructions (Signed)

## 2021-01-28 NOTE — Progress Notes (Signed)
Symptom Management Consult note [place]    Patient Care Team: Zanard, Bernadene Bell, MD as PCP - General (Family Medicine) Mauro Kaufmann, RN as Oncology Nurse Navigator Rockwell Germany, RN as Oncology Nurse Navigator Rolm Bookbinder, MD as Consulting Physician (General Surgery) Magrinat, Virgie Dad, MD as Consulting Physician (Oncology) Kyung Rudd, MD as Consulting Physician (Radiation Oncology) Cheri Fowler, MD as Consulting Physician (Obstetrics and Gynecology) Elsie Saas, MD as Consulting Physician (Orthopedic Surgery) Juanita Craver, MD as Consulting Physician (Gastroenterology)    Name of the patient: [name]  [id number]  1965-12-24   Date of visit: 01/28/2021    Chief complaint/ Reason for visit- follow up, recheck bilirubin  Oncology History  Malignant neoplasm of upper-outer quadrant of right breast in female, estrogen receptor positive ([place])  03/31/2020 Cancer Staging   Staging form: Breast, AJCC 8th Edition - Pathologic stage from 03/31/2020: Stage IV (cM1) - Signed by Gardenia Phlegm, NP on 05/01/2020    04/03/2020 Initial Diagnosis   Malignant neoplasm of upper-outer quadrant of right breast in female, estrogen receptor positive ([place])   04/08/2020 Cancer Staging   Staging form: Breast, AJCC 8th Edition - Clinical stage from 04/08/2020: Stage IIB (cT1c, cN1(f), cM0, G2, ER-, PR-, HER2-) - Signed by Chauncey Cruel, MD on 04/08/2020    04/24/2020 - 10/02/2020 Chemotherapy          04/24/2020 Genetic Testing   Negative genetic testing:  No pathogenic variants detected on the [facility] CustomNext-Cancer + RNAinsight panel. The report date is 04/24/2020.   The CustomNext-Cancer+RNAinsight panel offered by [facility] includes sequencing and rearrangement analysis for the following 47 genes:  APC, ATM, AXIN2, BARD1, BMPR1A, BRCA1, BRCA2, BRIP1, CDH1, CDK4, CDKN2A, CHEK2, DICER1, EPCAM, GREM1, HOXB13, MEN1, MLH1, MSH2, MSH3, MSH6,  MUTYH, NBN, NF1, NF2, NTHL1, PALB2, PMS2, POLD1, POLE, PTEN, RAD51C, RAD51D, RECQL, RET, SDHA, SDHAF2, SDHB, SDHC, SDHD, SMAD4, SMARCA4, STK11, TP53, TSC1, TSC2, and VHL.  RNA data is routinely analyzed for use in variant interpretation for all genes.     Current Therapy: discontinued abemaciclib at visit x 2 days ago, undergoing radiation with session earlier today  Interval history- [name] is a 55 yo female with above stated history presenting to [facility] today with chief complaint of follow up and labs recheck. Patient seen here x 2 days ago for pruritus and found to have bilirubin of 7.3 likely related to abemaciclib therapy so that was discontinued per oncologist Dr. [name]. Plan made to check RUQ [place] and repeat labs Thursday and Monday. Patient reports that itching as slightly improved with cholestyramine, although is still waking her up at night. She admits to feeling fatigued because she is not getting consistent rest through out the night and her daily radiation treatments could be adding to fatigue. She reports decreased appetite as well, she tries to make herself at least take bites of food throughout the day. She tolerates water and Gatorade without difficulty. Denies fever, chills, shortness of breath, chest pain, abdominal pain, nausea, emesis, urinary symptoms, diarrhea.   ROS  All other systems are reviewed and are negative for acute change except as noted in the HPI.    No Known Allergies   Past Medical History:  Diagnosis Date   Allergy    Arthritis 2018   hands, left knee   Breast cancer ([place]) 03-27-2020   right breast IMC   Eczema    Family history of breast cancer    Family history  of lung cancer    Family history of prostate cancer    Hypertension      Past Surgical History:  Procedure Laterality Date   ABDOMINAL HYSTERECTOMY     CESAREAN SECTION     KNEE ARTHROSCOPY W/ ACL RECONSTRUCTION Left    KNEE SURGERY     MODIFIED MASTECTOMY Right 12/07/2020    Procedure: RIGHT MODIFIED RADICAL MASTECTOMY;  Surgeon: Rolm Bookbinder, MD;  Location: [place];  Service: General;  Laterality: Right;   PORTACATH PLACEMENT Left 04/23/2020   Procedure: LEFT SIDE PORT PLACEMENT WITH ULTRASOUND GUIDANCE;  Surgeon: Rolm Bookbinder, MD;  Location: [place];  Service: General;  Laterality: Left;  230PM START TIME PLEASE ROOM 2 THANKS   TUBAL LIGATION      Social History   Socioeconomic History   Marital status: Divorced    Spouse name: Not on file   Number of children: Not on file   Years of education: Not on file   Highest education level: Not on file  Occupational History   Not on file  Tobacco Use   Smoking status: Former    Packs/day: 0.25    Years: 36.00    Pack years: 9.00    Types: Cigarettes    Quit date: 04/22/2017    Years since quitting: 3.7   Smokeless tobacco: Never   Tobacco comments:    She is trying to quit.    Substance and Sexual Activity   Alcohol use: Yes    Alcohol/week: 3.0 standard drinks    Types: 3 Glasses of wine per week    Comment: Occasional    Drug use: Never   Sexual activity: Yes    Partners: Male    Birth control/protection: Surgical  Other Topics Concern   Not on file  Social History Narrative   Marital Status: Divorced    Children:  Sons (2)    Pets: None   Living Situation: Lives alone   Occupation: Wellsite geologist (Financial trader)     Education: Programmer, systems    Tobacco Use/Exposure:  She has smoked as much 1 ppd.  She has smoked for > 20 years.  She is trying to quit and is down to about 3-5 cigs per day.     Alcohol Use: 4 days/months  (3 drinks)    Drug Use:  None   Diet:  Regular   Exercise:  Walking/ Zumba/Basketball, Biking   Hobbies: Dancing/ Cooking                Social Determinants of Health   Financial Resource Strain: Not on file  Food Insecurity: No Food Insecurity   Worried About Charity fundraiser in the Last Year: Never true    Ran Out of Food in the Last Year: Never true  Transportation Needs: No Transportation Needs   Lack of Transportation (Medical): No   Lack of Transportation (Non-Medical): No  Physical Activity: Not on file  Stress: Not on file  Social Connections: Not on file  Intimate Partner Violence: Not on file    Family History  Problem Relation Age of Onset   Breast cancer Mother 23   Arthritis Mother    Diabetes Father    Kidney disease Father    Hypertension Sister    Heart disease Maternal Grandmother    Breast cancer Paternal Grandmother        dx >50   Heart disease Maternal Aunt    Breast cancer Maternal Aunt  77   Other Maternal Aunt        brain tumor (not cancerous)   Lung cancer Maternal Aunt    Prostate cancer Cousin 56       localized     Current Outpatient Medications:    acetaminophen (TYLENOL) 500 MG tablet, Take 1,000 mg by mouth every 6 (six) hours as needed for moderate pain., Disp: , Rfl:    Boric Acid Vaginal 600 MG SUPP, Place 600 mg vaginally daily as needed (yeast infections)., Disp: , Rfl:    Cholecalciferol (VITAMIN D) 125 MCG (5000 UT) CAPS, Take 5,000 Units by mouth daily., Disp: , Rfl:    cholestyramine (QUESTRAN) 4 g packet, Take 1 packet (4 g total) by mouth 2 (two) times daily for 5 days., Disp: 10 packet, Rfl: 0   clobetasol ointment (TEMOVATE) 3.72 %, Apply 1 application topically 2 (two) times daily as needed (rash/itching)., Disp: , Rfl:    Cyanocobalamin (B-12) 5000 MCG CAPS, Take 5,000 mcg by mouth daily., Disp: , Rfl:    loperamide (IMODIUM) 2 MG capsule, Take 2 tablets after first diarrheal bowel movement, then one tablet after each following diarrheal movement; maximum 6 tablets/ day, Disp: 60 capsule, Rfl: 6   methocarbamol (ROBAXIN) 500 MG tablet, Take 1 tablet (500 mg total) by mouth every 6 (six) hours as needed for muscle spasms., Disp: 30 tablet, Rfl: 1   prochlorperazine (COMPAZINE) 10 MG tablet, Take 1 tablet (10 mg total) by mouth every 6  (six) hours as needed for nausea or vomiting., Disp: 30 tablet, Rfl: 4   propranolol (INDERAL) 10 MG tablet, Take 10 mg by mouth 2 (two) times daily., Disp: , Rfl:    spironolactone (ALDACTONE) 25 MG tablet, Take 1 tablet by mouth 2 (two) times daily., Disp: , Rfl:   Current Facility-Administered Medications:    0.9 %  sodium chloride infusion, , Intravenous, Once, Walisiewicz, Yadira Hada E, PA-C   heparin lock flush 100 unit/mL, 500 Units, Intracatheter, Once, Magrinat, Virgie Dad, MD  PHYSICAL EXAM: ECOG FS:1 - Symptomatic but completely ambulatory    Vitals:   01/28/21 1105  BP: 135/85  Pulse: 82  Resp: 20  Temp: 97.8 F (36.6 C)  TempSrc: Oral  SpO2: 99%   Physical Exam Vitals and nursing note reviewed.  Constitutional:      Appearance: She is well-developed. She is not ill-appearing or toxic-appearing.  HENT:     Head: Normocephalic and atraumatic.     Nose: Nose normal.  Eyes:     General: Scleral icterus present.        Right eye: No discharge.        Left eye: No discharge.     Conjunctiva/sclera: Conjunctivae normal.  Neck:     Vascular: No JVD.  Cardiovascular:     Rate and Rhythm: Normal rate and regular rhythm.     Pulses: Normal pulses.     Heart sounds: Normal heart sounds.  Pulmonary:     Effort: Pulmonary effort is normal.     Breath sounds: Normal breath sounds.  Chest:     Comments: Surgical scars  present from right mastectomy. There is puncture size wound below right axilla with very slight wound dehiscence. Not gaping. No purulent drainage. No surrounding erythema. Not tender to the touch. No warmth appreciated   Abdominal:     General: Bowel sounds are normal. There is no distension.     Tenderness: There is no abdominal tenderness. There is no right CVA tenderness, left  CVA tenderness or guarding.     Hernia: No hernia is present.  Musculoskeletal:        General: Normal range of motion.     Cervical back: Normal range of motion.  Skin:     General: Skin is warm and dry.  Neurological:     Mental Status: She is oriented to person, place, and time.     GCS: GCS eye subscore is 4. GCS verbal subscore is 5. GCS motor subscore is 6.     Comments: Fluent speech, no facial droop.  Psychiatric:        Behavior: Behavior normal.       LABORATORY DATA: I have reviewed the data as listed CBC Latest Ref Rng & Units 01/28/2021 01/26/2021 12/31/2020  WBC 4.0 - 10.5 K/uL 2.7(L) 3.1(L) 4.6  Hemoglobin 12.0 - 15.0 g/dL 9.9(L) 10.4(L) 9.0(L)  Hematocrit 36.0 - 46.0 % 30.9(L) 32.6(L) 28.2(L)  Platelets 150 - 400 K/uL 241 268 252     CMP Latest Ref Rng & Units 01/28/2021 01/26/2021 12/31/2020  Glucose 70 - 99 mg/dL 140(H) 122(H) 96  BUN 6 - 20 mg/dL '15 13 16  ' Creatinine 0.44 - 1.00 mg/dL 1.20(H) 0.87 0.93  Sodium 135 - 145 mmol/L 137 137 139  Potassium 3.5 - 5.1 mmol/L 3.8 3.9 4.0  Chloride 98 - 111 mmol/L 107 106 107  CO2 22 - 32 mmol/L 20(L) 22 25  Calcium 8.9 - 10.3 mg/dL 10.0 10.4(H) 10.0  Total Protein 6.5 - 8.1 g/dL 7.7 8.1 8.4(H)  Total Bilirubin 0.3 - 1.2 mg/dL 8.6(HH) 7.3(HH) 0.4  Alkaline Phos 38 - 126 U/L 349(H) 315(H) 69  AST 15 - 41 U/L '26 26 16  ' ALT 0 - 44 U/L 30 36 11       RADIOGRAPHIC STUDIES: I have personally reviewed the radiological images as listed and agreed with the findings in the report. No images are attached to the encounter. CT Chest W Contrast  Result Date: 12/30/2020 CLINICAL DATA:  Restaging metastatic breast cancer the. EXAM: CT CHEST, ABDOMEN, AND PELVIS WITH CONTRAST TECHNIQUE: Multidetector CT imaging of the chest, abdomen and pelvis was performed following the standard protocol during bolus administration of intravenous contrast. CONTRAST:  34m OMNIPAQUE IOHEXOL 350 MG/ML SOLN COMPARISON:  Chest CT 10/07/2020 the heart is normal in size. No pericardial effusion. The aorta is normal in caliber. No dissection or significant atherosclerotic calcifications. The branch vessels are patent.  FINDINGS: CT CHEST FINDINGS Cardiovascular: The heart is normal in size. No pericardial effusion. The aorta is normal in caliber. No dissection. The branch vessels are patent. Mediastinum/Nodes: Small scattered mediastinal and hilar lymph nodes but no mass or adenopathy. The esophagus is grossly normal. The thyroid gland is unremarkable and stable Lungs/Pleura: No worrisome pulmonary lesions or pulmonary nodules to suggest metastatic disease. No acute pulmonary findings. No pleural effusions or pleural nodules. Musculoskeletal: New surgical changes involving the right breast from a right mastectomy and probable radiation changes. The right axillary lymph nodes have also been resected. No findings suspicious for residual or recurrent tumor. Stable appearing diffuse sclerotic metastatic bone disease. There is a lytic lesion in the T8 vertebral body which is slightly larger. It measures approximately 15 mm and previously measured 9.5 mm. CT ABDOMEN PELVIS FINDINGS Hepatobiliary: No findings suspicious for hepatic metastatic disease. The gallbladder is unremarkable. No common bile duct dilatation. Pancreas: No mass, inflammation or ductal dilatation. Spleen: Normal size.  No focal lesions. Adrenals/Urinary Tract: Adrenal glands are unremarkable. No renal lesions  or hydronephrosis. The bladder is unremarkable. Stomach/Bowel: Stomach, duodenum, small bowel and colon unremarkable. Terminal ileum is normal. The appendix is normal. Vascular/Lymphatic: The aorta and branch vessels are patent. The major venous structures are patent. Moderate retroperitoneal lymphadenopathy the is present. I do not have any prior CT scans of the abdomen for comparison. The largest left-sided retroperitoneal lymph node on image 69/2 measures 3.0 x 2.4 cm. Lower left para-aortic node on image 74/2 measures 12.5 mm. Reproductive: The uterus is surgically absent. Both ovaries are still present and appear normal. Other: No pelvic mass or pelvic  adenopathy.  No inguinal adenopathy. Musculoskeletal: Stable appearing sclerotic bone lesions. No definite new or progressive findings in the lumbar spine or pelvis. IMPRESSION: 1. New surgical changes from a right mastectomy and right axillary lymph dissection. No findings suspicious for residual or recurrent tumor. 2. 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 There is a lytic lesion in the T8 vertebral body which is slightly larger. No definite new or progressive findings in the lumbar spine or pelvis. 3. Retroperitoneal lymphadenopathy not previously imaged. Recommend attention on future scans. 4. No findings for pulmonary metastatic disease. Electronically Signed   By: Marijo Sanes M.D.   On: 12/30/2020 16:26   CT Abdomen Pelvis W Contrast  Result Date: 12/30/2020 CLINICAL DATA:  Restaging metastatic breast cancer the. EXAM: CT CHEST, ABDOMEN, AND PELVIS WITH CONTRAST TECHNIQUE: Multidetector CT imaging of the chest, abdomen and pelvis was performed following the standard protocol during bolus administration of intravenous contrast. CONTRAST:  59m OMNIPAQUE IOHEXOL 350 MG/ML SOLN COMPARISON:  Chest CT 10/07/2020 the heart is normal in size. No pericardial effusion. The aorta is normal in caliber. No dissection or significant atherosclerotic calcifications. The branch vessels are patent. FINDINGS: CT CHEST FINDINGS Cardiovascular: The heart is normal in size. No pericardial effusion. The aorta is normal in caliber. No dissection. The branch vessels are patent. Mediastinum/Nodes: Small scattered mediastinal and hilar lymph nodes but no mass or adenopathy. The esophagus is grossly normal. The thyroid gland is unremarkable and stable Lungs/Pleura: No worrisome pulmonary lesions or pulmonary nodules to suggest metastatic disease. No acute pulmonary findings. No pleural effusions or pleural nodules. Musculoskeletal: New surgical changes involving the right breast from a right mastectomy and  probable radiation changes. The right axillary lymph nodes have also been resected. No findings suspicious for residual or recurrent tumor. Stable appearing diffuse sclerotic metastatic bone disease. There is a lytic lesion in the T8 vertebral body which is slightly larger. It measures approximately 15 mm and previously measured 9.5 mm. CT ABDOMEN PELVIS FINDINGS Hepatobiliary: No findings suspicious for hepatic metastatic disease. The gallbladder is unremarkable. No common bile duct dilatation. Pancreas: No mass, inflammation or ductal dilatation. Spleen: Normal size.  No focal lesions. Adrenals/Urinary Tract: Adrenal glands are unremarkable. No renal lesions or hydronephrosis. The bladder is unremarkable. Stomach/Bowel: Stomach, duodenum, small bowel and colon unremarkable. Terminal ileum is normal. The appendix is normal. Vascular/Lymphatic: The aorta and branch vessels are patent. The major venous structures are patent. Moderate retroperitoneal lymphadenopathy the is present. I do not have any prior CT scans of the abdomen for comparison. The largest left-sided retroperitoneal lymph node on image 69/2 measures 3.0 x 2.4 cm. Lower left para-aortic node on image 74/2 measures 12.5 mm. Reproductive: The uterus is surgically absent. Both ovaries are still present and appear normal. Other: No pelvic mass or pelvic adenopathy.  No inguinal adenopathy. Musculoskeletal: Stable appearing sclerotic bone lesions. No definite new or progressive findings in the  lumbar spine or pelvis. IMPRESSION: 1. New surgical changes from a right mastectomy and right axillary lymph dissection. No findings suspicious for residual or recurrent tumor. 2. 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 There is a lytic lesion in the T8 vertebral body which is slightly larger. No definite new or progressive findings in the lumbar spine or pelvis. 3. Retroperitoneal lymphadenopathy not previously imaged. Recommend attention on future  scans. 4. No findings for pulmonary metastatic disease. Electronically Signed   By: Marijo Sanes M.D.   On: 12/30/2020 16:26   US Abdomen Limited RUQ (LIVER/GB)  Result Date: 01/28/2021 CLINICAL DATA:  Elevated bilirubin. EXAM: ULTRASOUND ABDOMEN LIMITED RIGHT UPPER QUADRANT COMPARISON:  CT temporal bones, 12/29/2020. FINDINGS: Gallbladder: Nondistended, sludge-filled gallbladder with gallbladder wall measuring near the upper limit of normal in thickness, at 0.4 cm. No sonographic Murphy sign noted by sonographer. Common bile duct: Diameter: 0.6 cm Liver: No focal lesion identified. Within normal limits in parenchymal echogenicity. Portal vein is patent on color Doppler imaging with normal direction of blood flow towards the liver. Other: No ascites. IMPRESSION: Biliary sludge without additional sonographic evidence of acute cholecystitis. If concern for biliary dyskinesia, recommend NM HIDA for further evaluation. Electronically Signed   By: Michaelle Birks M.D.   On: 01/28/2021 08:12     ASSESSMENT & PLAN: Patient is a 55 y.o. female with history of malignant neoplasm or upper-outer quadrant of right breast in female, ER + s/p R mastectomy currently undergoing radiation and discontinued abemaciclib x 2 days ago followed by oncologist Dr. Doris Cheadle.    #)Elevated bilirubin- labs today show bilirubin still increasing, 8.6 from 7.3 x 2 dasy ago. Still with no transaminitis. There is slight bump in creatinine today to 1.20, likely from poor PO intake. Patient reports feeling dehydrated, skin is dry. Patient given 1L NS in clinic today. Reviewed [place] results with patient, there is gallbladder sludge without acute cholecystitis. Abdominal exam is benign, no peritoneal signs. She still has scleral icterus which correlates with elevated bilirubin. Patient to follow up with Dr. [name] Monday after labs.  #)wound- rechecked wound on surgical incision. No signs of infection at this time. Discussed signs to watch  for and wound home care. Recommended she have it rechecked Monday at her next appointment.  #) malignant neoplasm or upper-outer quadrant of right breast in female, ER +- Abemaciclib was discontinued at lat visit. Patient still undergoing radiation and seems to be tolerating well besides fatigue. Patient to discuss treatment options with Dr. [name] at next appointment.   Visit Diagnosis: 1. Poor fluid intake   2. High bilirubin   3. Malignant neoplasm of upper-outer quadrant of right breast in female, estrogen receptor positive ([place])   4. Port-A-Cath in place   5. Bone metastases ([place])      No orders of the defined types were placed in this encounter.   All questions were answered. The patient knows to call the clinic with any problems, questions or concerns. No barriers to learning was detected.  I have spent a total of 30 minutes minutes of face-to-face and non-face-to-face time, preparing to see the patient, obtaining and/or reviewing separately obtained history, performing a medically appropriate examination, counseling and educating the patient, ordering tests,  documenting clinical information in the electronic health record, and care coordination.     Thank you for allowing me to participate in the care of this patient.    Barrie Folk, PA-C Department of Hematology/Oncology [facility] at [place]  [facility] Phone: [phone]  [phone]    01/28/2021 12:29 PM

## 2021-01-29 ENCOUNTER — Other Ambulatory Visit: Payer: Self-pay

## 2021-01-29 ENCOUNTER — Ambulatory Visit
Admission: RE | Admit: 2021-01-29 | Discharge: 2021-01-29 | Disposition: A | Discharge status: Home / Self Care | Payer: BC Managed Care – PPO | Source: Ambulatory Visit | Attending: Radiation Oncology | Admitting: Radiation Oncology

## 2021-01-29 DIAGNOSIS — C50411 Malignant neoplasm of upper-outer quadrant of right female breast: Secondary | ICD-10-CM | POA: Diagnosis not present

## 2021-01-31 ENCOUNTER — Ambulatory Visit
Admission: RE | Admit: 2021-01-31 | Discharge: 2021-01-31 | Disposition: A | Discharge status: Home / Self Care | Payer: BC Managed Care – PPO | Source: Ambulatory Visit | Attending: Radiation Oncology | Admitting: Radiation Oncology

## 2021-01-31 DIAGNOSIS — C7889 Secondary malignant neoplasm of other digestive organs: Secondary | ICD-10-CM | POA: Diagnosis not present

## 2021-01-31 DIAGNOSIS — R0602 Shortness of breath: Secondary | ICD-10-CM | POA: Diagnosis not present

## 2021-01-31 NOTE — Progress Notes (Signed)
[name]  [phone] [phone]    ID: [name] DOB: 06-22-1965  MR#: [id number]  NOI#:[id number]  Patient Care Team: [name], MD as PCP - General (Family Medicine) Mauro Kaufmann, RN as Oncology Nurse Navigator Rockwell Germany, RN as Oncology Nurse Navigator Rolm Bookbinder, MD as Consulting Physician (General Surgery) Darl Brisbin, Virgie Dad, MD as Consulting Physician (Oncology) Kyung Rudd, MD as Consulting Physician (Radiation Oncology) Cheri Fowler, MD as Consulting Physician (Obstetrics and Gynecology) Elsie Saas, MD as Consulting Physician (Orthopedic Surgery) Juanita Craver, MD as Consulting Physician (Gastroenterology) Chauncey Cruel, MD OTHER MD:  CHIEF COMPLAINT: triple negative breast cancer (s/p right mastectomy)  CURRENT TREATMENT: adjuvant radiation pending   INTERVAL HISTORY: [name] returns today for follow up and treatment of her 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    Since her last visit, she was referred back to Dr. Lisbeth Renshaw and PA Bryson Ha on 01/06/2021 to review postmastectomy radiation. She subsequently began treatment on 01/14/2021 and had her treatment today 03/03/2021.    Last week she presented with jaundice and itching.  She was evaluated in our symptom management clinic and found to have a significantly elevated bilirubin at 7.3.  (It was 0.4 a month ago).  We felt likely that this was due to Verzenio and the Verzenio was stopped.  Labs were repeated 2 days later and the bilirubin was up to 8.6.  Accordingly we obtained an abdomen ultrasound on 01/28/2021. This showed biliary sludge in the gallbladder without additional sonographic evidence of acute cholecystitis and there was no evidence of liver involvement   Today I expected her to be better but she is actually worse.  Her total bilirubin is up to 11.2.  Her itching is worse and not helped by the Questran that was started last week or by  Benadryl.  She feels weaker and short of breath.  Overall the picture is consistent with biliary obstruction and she was referred to the emergency room for further evaluation and treatment.Marland Kitchen   REVIEW OF SYSTEMS: A detailed report review of systems was negative except as noted above   COVID 19 VACCINATION STATUS: fully vaccinated [employer]), with booster   HISTORY OF CURRENT ILLNESS: From the original intake note:  STELLAR GENSEL had routine screening mammography showing a possible abnormality in the right breast and a prominent intramammary lymph node. She underwent right diagnostic mammography with tomography and right breast ultrasonography at [facility] on 03/26/2020 showing: breast density category B; palpable 1.8 cm right breast mass at 10 o'clock, which feels much larger than what is visualized sonographically; suspicious 0.6 cm intramammary lymph node in right breast at 10 o'clock; at least 3 morphologically abnormal lymph nodes in right axilla.  Accordingly on 03/27/2020 she proceeded to biopsy of the right breast areas in question. The pathology from this procedure ([id number]) showed:  1. Right Breast, 10 o'clock (mass)  - invasive mammary carcinoma, e-cadherin positive with a lobular growth pattern, grade 2  - mammary carcinoma in situ  - Prognostic indicators significant for: estrogen receptor, 1% positive and progesterone receptor, 1% positive, both with strong staining intensity. Proliferation marker Ki67 at 30%. HER2 negative by immunohistochemistry (0). 2. Right Breast, 10 o'clock (intramammary node)  - mammary carcinoma involving lymph node and perinodal soft tissue  -  Prognostic indicators significant for: estrogen receptor, 1% positive with strong staining intensity and progesterone receptor, 0% negative. Proliferation marker Ki67 at 40%. HER2 negative by immunohistochemistry (0).   Cancer  Staging  Malignant neoplasm of upper-outer quadrant of right breast in female,  estrogen receptor positive ([place]) Staging form: Breast, AJCC 8th Edition - Pathologic stage from 03/31/2020: Stage IV (cM1) - Signed by Gardenia Phlegm, NP on 05/01/2020 - Clinical stage from 04/08/2020: Stage IIB (cT1c, cN1(f), cM0, G2, ER-, PR-, HER2-) - Signed by Chauncey Cruel, MD on 04/08/2020 Stage prefix: Initial diagnosis Method of lymph node assessment: Core biopsy  The patient's subsequent history is as detailed below.   PAST MEDICAL HISTORY: Past Medical History:  Diagnosis Date   Allergy    Arthritis 2018   hands, left knee   Breast cancer ([place]) 03-27-2020   right breast IMC   Eczema    Family history of breast cancer    Family history of lung cancer    Family history of prostate cancer    Hypertension     PAST SURGICAL HISTORY: Past Surgical History:  Procedure Laterality Date   ABDOMINAL HYSTERECTOMY     CESAREAN SECTION     KNEE ARTHROSCOPY W/ ACL RECONSTRUCTION Left    KNEE SURGERY     MODIFIED MASTECTOMY Right 12/07/2020   Procedure: RIGHT MODIFIED RADICAL MASTECTOMY;  Surgeon: Rolm Bookbinder, MD;  Location: [facility];  Service: General;  Laterality: Right;   PORTACATH PLACEMENT Left 04/23/2020   Procedure: LEFT SIDE PORT PLACEMENT WITH ULTRASOUND GUIDANCE;  Surgeon: Rolm Bookbinder, MD;  Location: [facility];  Service: General;  Laterality: Left;  230PM START TIME PLEASE ROOM 2 THANKS   TUBAL LIGATION      FAMILY HISTORY: Family History  Problem Relation Age of Onset   Breast cancer Mother 28   Arthritis Mother    Diabetes Father    Kidney disease Father    Hypertension Sister    Heart disease Maternal Grandmother    Breast cancer Paternal Grandmother        dx >50   Heart disease Maternal Aunt    Breast cancer Maternal Aunt 77   Other Maternal Aunt        brain tumor (not cancerous)   Lung cancer Maternal Aunt    Prostate cancer Cousin 36       localized   Her parents are both living, her father  age 29 and mother age 68, as of 03/2020. [name] has 1 brother and 2 sisters. She reports breast cancer in her mother at age 72, her maternal aunt, and her paternal grandmother. Her maternal aunt also has a history of lung cancer. She also reports prostate cancer in a maternal cousin in his 42's.   GYNECOLOGIC HISTORY:  No LMP recorded. Patient has had a hysterectomy. Menarche: 55 years old Age at first live birth: 55 years old [place] P 2 LMP 2010 with hysterectomy Contraceptive: prior use without issue HRT never used  Hysterectomy? Yes, 2010 for fibroids BSO? 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 She is divorced. She lives at home with son [name], age 34, who works for [employer]. Son [place], Brooke Bonito, age 25, works as a Development worker, international aid in [place], [place]. Susanne has no grandchildren. She attends [employer].    ADVANCED DIRECTIVES: not in place; considering naming mother Melody Haver, son [name], or son Raynald as healthcare powers of attorney   HEALTH MAINTENANCE: Social History   Tobacco Use   Smoking status: Former    Packs/day: 0.25    Years: 36.00  Pack years: 9.00    Types: Cigarettes    Quit date: 04/22/2017    Years since quitting: 3.7   Smokeless tobacco: Never   Tobacco comments:    She is trying to quit.    Vaping Use   Vaping Use: Never used  Substance Use Topics   Alcohol use: Yes    Alcohol/week: 3.0 standard drinks    Types: 3 Glasses of wine per week    Comment: Occasional    Drug use: Never     Colonoscopy: age 63  PAP: date unsure  Bone density: never done   No Known Allergies  No current facility-administered medications for this visit.   Current Outpatient Medications  Medication Sig Dispense Refill   acetaminophen (TYLENOL) 500 MG tablet Take 1,000 mg by mouth every 6 (six) hours as needed for moderate pain.     Boric Acid Vaginal 600 MG SUPP Place  600 mg vaginally daily as needed (yeast infections).     Cholecalciferol (VITAMIN D) 125 MCG (5000 UT) CAPS Take 5,000 Units by mouth every morning.     cholestyramine (QUESTRAN) 4 g packet Take 1 packet (4 g total) by mouth 2 (two) times daily for 5 days. (Patient not taking: Reported on 02/01/2021) 10 packet 0   clobetasol ointment (TEMOVATE) 2.95 % Apply 1 application topically 2 (two) times daily as needed (rash/itching).     Cyanocobalamin (B-12) 5000 MCG CAPS Take 5,000 mcg by mouth every morning.     loperamide (IMODIUM) 2 MG capsule Take 2 tablets after first diarrheal bowel movement, then one tablet after each following diarrheal movement; maximum 6 tablets/ day (Patient taking differently: 2-4 mg See admin instructions. Take 2 tablets  (4 mg) by mouth after first diarrheal bowel movement, then one tablet (2 mg) after each following diarrheal movement; maximum 6 tablets/ day) 60 capsule 6   methocarbamol (ROBAXIN) 500 MG tablet Take 1 tablet (500 mg total) by mouth every 6 (six) hours as needed for muscle spasms. 30 tablet 1   prochlorperazine (COMPAZINE) 10 MG tablet Take 1 tablet (10 mg total) by mouth every 6 (six) hours as needed for nausea or vomiting. 30 tablet 4   propranolol (INDERAL) 10 MG tablet Take 10 mg by mouth 2 (two) times daily.     spironolactone (ALDACTONE) 25 MG tablet Take 25 mg by mouth 2 (two) times daily.     Facility-Administered Medications Ordered in Other Visits  Medication Dose Route Frequency Provider Last Rate Last Admin   [START ON 02/02/2021] influenza vac split quadrivalent PF (FLUARIX) injection 0.5 mL  0.5 mL Intramuscular Tomorrow-1000 Malvin Johns, MD        OBJECTIVE: African-American woman who appears stated age Vitals:   02/01/21 1139  BP: 127/77  Pulse: (!) 111  Resp: 20  Temp: 98.1 F (36.7 C)  SpO2: 100%      Body mass index is 28.85 kg/m.   Wt Readings from Last 3 Encounters:  02/01/21 201 lb 1.6 oz (91.2 kg)  02/01/21 201 lb 1.6  oz (91.2 kg)  01/06/21 208 lb 9.6 oz (94.6 kg)     ECOG FS:1 - Symptomatic but completely ambulatory  Sclerae icteric, EOMs intact Lungs no rales or rhonchi Heart regular rate and rhythm Abd soft, nontender Neuro: nonfocal, well oriented, appropriate affect Breasts: Deferred   LAB RESULTS:  CMP     Component Value Date/Time   NA 134 (L) 02/01/2021 1418   K 4.1 02/01/2021 1418   CL 104 02/01/2021  1418   CO2 21 (L) 02/01/2021 1418   GLUCOSE 107 (H) 02/01/2021 1418   BUN 15 02/01/2021 1418   CREATININE 1.17 (H) 02/01/2021 1418   CREATININE 0.93 12/31/2020 1554   CREATININE 0.69 09/24/2013 0819   CALCIUM 10.1 02/01/2021 1418   PROT 7.5 02/01/2021 1418   ALBUMIN 3.1 (L) 02/01/2021 1418   AST 31 02/01/2021 1418   AST 16 12/31/2020 1554   ALT 30 02/01/2021 1418   ALT 11 12/31/2020 1554   ALKPHOS 324 (H) 02/01/2021 1418   BILITOT 10.5 (H) 02/01/2021 1418   BILITOT 0.4 12/31/2020 1554   GFRNONAA 55 (L) 02/01/2021 1418   GFRNONAA >60 12/31/2020 1554   GFRNONAA >89 09/24/2013 0819   GFRAA >89 09/24/2013 0819    No results found for: TOTALPROTELP, ALBUMINELP, A1GS, A2GS, BETS, BETA2SER, GAMS, MSPIKE, SPEI  Lab Results  Component Value Date   WBC 4.0 02/01/2021   NEUTROABS PENDING 02/01/2021   HGB 9.8 (L) 02/01/2021   HCT 29.6 (L) 02/01/2021   MCV 101.4 (H) 02/01/2021   PLT 221 02/01/2021    No results found for: LABCA2  No components found for: KPQAES975  Recent Labs  Lab 02/01/21 1418  INR 1.1    No results found for: LABCA2  No results found for: PYY511  No results found for: MYT117  No results found for: BVA701  No results found for: CA2729  No components found for: HGQUANT  No results found for: CEA1 / No results found for: CEA1   No results found for: AFPTUMOR  No results found for: CHROMOGRNA  No results found for: KPAFRELGTCHN, LAMBDASER, KAPLAMBRATIO (kappa/lambda light chains)  No results found for: HGBA, HGBA2QUANT, HGBFQUANT,  HGBSQUAN (Hemoglobinopathy evaluation)   No results found for: LDH  No results found for: IRON, TIBC, IRONPCTSAT (Iron and TIBC)  No results found for: FERRITIN  Urinalysis    Component Value Date/Time   COLORURINE AMBER (A) 02/01/2021 [place] 02/01/2021 1658   LABSPEC 1.033 (H) 02/01/2021 1658   PHURINE 5.0 02/01/2021 [place] 02/01/2021 [place] 02/01/2021 [place] 02/01/2021 1658   KETONESUR NEGATIVE 02/01/2021 1658   PROTEINUR NEGATIVE 02/01/2021 1658   NITRITE NEGATIVE 02/01/2021 1658   LEUKOCYTESUR NEGATIVE 02/01/2021 1658    STUDIES: CT Abdomen Pelvis W Contrast  Result Date: 02/01/2021 CLINICAL DATA:  Itching in jaundice for 7 days. Shortness of breath. Dark colored urine. Nausea. Radiation therapy yesterday. History of breast cancer with osseous metastatic disease. EXAM: CT ABDOMEN AND PELVIS WITH CONTRAST TECHNIQUE: Multidetector CT imaging of the abdomen and pelvis was performed using the standard protocol following bolus administration of intravenous contrast. CONTRAST:  65m OMNIPAQUE IOHEXOL 350 MG/ML SOLN COMPARISON:  12/29/2020 FINDINGS: Lower chest: Right mastectomy. Stable scarring in the right lower lobe. Hepatobiliary: New substantial intrahepatic biliary dilatation observed along with gallbladder wall thickening and high density material in the gallbladder which could be from sludge, proteinaceous fluid, or blood products. There is narrowing and substantial wall enhancement in the right and left hepatic ducts as they converge, raising the possibility of cholangitis. Ill definition of the common bile duct probably from wall thickening. No well-defined stones. Pancreas: Stranding around the pancreatic head along with new mildly dilated dorsal pancreatic duct with truncation of caliber in the pancreatic head region, appearance raises some suspicion for focal pancreatitis. I do not see a specific  well-defined mass in the pancreatic head. Spleen: Unremarkable Adrenals/Urinary Tract: New abnormal hydronephrosis  and proximal hydroureter extending down to a 8.5 cm dilated segment of the right distal ureter which contains high internal density; this is new compared to the 12/29/2020 exam and accordingly raises suspicion for a blood clot rather than tumor. Mildly delayed nephrogram on the right. No well-defined stone. Adrenal glands unremarkable. Urinary bladder unremarkable. Stomach/Bowel: No dilated bowel observed.  Normal appendix. Vascular/Lymphatic: Left periaortic lymph node 1.3 cm in short axis on image 42 series 2, previously 1.9 cm. Other retroperitoneal lymph nodes are likewise improved compared to previous. Reproductive: Uterus absent.  Adnexa unremarkable. Other: Stable mild retroperitoneal stranding. Musculoskeletal: Stable widespread scattered sclerotic lesions indicative of prior osseous metastatic disease. Small umbilical hernia contains adipose tissue. IMPRESSION: 1. Since 12/29/2020, there is new substantial intrahepatic biliary dilatation along with dorsal pancreatic duct dilatation. There seems to be abnormal enhancement and wall thickening in the common hepatic duct and common bile duct, along with gallbladder wall thickening and gallbladder sludge. In addition there is some stranding around the pancreatic head with truncation of the dilated dorsal pancreatic duct. Possibilities may include cholangitis and/or focal pancreatitis involving the pancreatic head. Acute cholecystitis not excluded. A discrete pancreatic head mass is not well seen. 2. In addition, there is new substantial right hydronephrosis and delayed nephrogram on the right extending down to a tapering plug of high density material in the proximal ureter, presumably representing a blood clot in the ureter given that the kidney and ureter had a normal appearance 1 month ago. Tumor in the ureter seems unlikely in this time frame.  Severe ureteritis might be a differential diagnostic consideration. 3. Mildly reduced retroperitoneal adenopathy, although the retroperitoneal stranding continues. 4. Stable widespread scattered sclerotic lesions indicative of prior metastatic disease. Electronically Signed   By: Van Clines M.D.   On: 02/01/2021 16:31   US Abdomen Limited RUQ (LIVER/GB)  Result Date: 01/28/2021 CLINICAL DATA:  Elevated bilirubin. EXAM: ULTRASOUND ABDOMEN LIMITED RIGHT UPPER QUADRANT COMPARISON:  CT temporal bones, 12/29/2020. FINDINGS: Gallbladder: Nondistended, sludge-filled gallbladder with gallbladder wall measuring near the upper limit of normal in thickness, at 0.4 cm. No sonographic Murphy sign noted by sonographer. Common bile duct: Diameter: 0.6 cm Liver: No focal lesion identified. Within normal limits in parenchymal echogenicity. Portal vein is patent on color Doppler imaging with normal direction of blood flow towards the liver. Other: No ascites. IMPRESSION: Biliary sludge without additional sonographic evidence of acute cholecystitis. If concern for biliary dyskinesia, recommend NM HIDA for further evaluation. Electronically Signed   By: Michaelle Birks M.D.   On: 01/28/2021 08:12     ELIGIBLE FOR AVAILABLE RESEARCH PROTOCOL: no  ASSESSMENT: 55 y.o. [place] woman status post right breast upper outer quadrant biopsy 03/27/2020 for a clinical T1c N3 M1, stageIV invasive ductal carcinoma, functionally triple negative, with an MIB-1 of 30-40%  (a) chest CT scan 04/20/2020 shows in addition to the breast mass and regional adenopathy, lytic and sclerotic bone changes, possible left thoracic adenopathy, and indeterminant left pleural nodularity  (b) bone scan 04/21/2020 confirms multiple bone lesions  (c) thoracic MRI confirms diffuse bony metastatic disease but shows no epidural soft tissue component and no cord compression  (1) genetics testing 04/24/2020 through the [facility]  RNA[facility]ight panel found no deleterious mutations in APC, ATM, AXIN2, BARD1, BMPR1A, BRCA1, BRCA2, BRIP1, CDH1, CDK4, CDKN2A, CHEK2, DICER1, EPCAM, GREM1, HOXB13, MEN1, MLH1, MSH2, MSH3, MSH6, MUTYH, NBN, NF1, NF2, NTHL1, PALB2, PMS2, POLD1, POLE, PTEN, RAD51C, RAD51D, RECQL, RET, SDHA, SDHAF2, SDHB, SDHC, SDHD, SMAD4, SMARCA4, STK11,  TP53, TSC1, TSC2, and VHL.  RNA data is routinely analyzed for use in variant interpretation for all genes.  (2) neoadjuvant chemotherapy will consist of pembrolizumab every 3 weeks, carboplatin every 3 weeks x 4 with paclitaxel weekly x12, started 04/24/2020, completed 07/10/2020  (a) consider doxorubicin and cyclophosphamide every 3 weeks x 4 at disease progression  (3) zoledronate started 05/08/2018, repeated every 12 weeks  (4) molecular studies:  (A) PT-L1 combined positive score of 5 (on January 2022 specimen)  (B) foundation 1 pending  (5) restaging studies:  (a) post-chemo breast MRI 06/30/2020 shows significant response  (b) CT chest 07/09/2020 shows decreased right axillary adenopathy, no lung or liver lesions, healing of lytic bone lesions  (c) CT chest 10/07/2020 shows progression of the right axillary adenopathy and upper abdominal retroperitoneal adenopathy, with stable scattered sclerotic bone lesions  (6) adjuvant pembrolizumab resumed 07/31/2020, discontinued after 10/02/2020 dose with disease progression  (7) anastrozole started 07/26/2020 given (weak) estrogen and progesterone receptor positivity  (A) MRI breast 09/28/2020 shows local progression in the right breast and axilla-- anastrozole discontinued   (8) fulvestrant started 10/08/2020  (A) abemaciclib added 10/24/2018  (B) fulvestrant discontinued after right modified radical mastectomy with repeat prognostic panel showing the tumor to be triple negative  (C) abemaciclib discontinued November 2022 with increased liver function tests  (9) status post right modified radical mastectomy on  12/07/2020 for a residual ympT2 ypN3a invasive ductal carcinoma, grade 3, with negative margins  (a) a total of 13 right axillary lymph nodes removed, 12 positive, with evidence of extracapsular extension; residual cancer burden III  (b) repeat prognostic panel estrogen and progesterone receptor negative, HER2 negative (1+).    (10) adjuvant radiation to be completed 03/03/2021  (11) restaging studies:  (A) CT scans of the chest abdomen and pelvis 12/29/2020 show no liver or lung involvement.  There is some left retroperitoneal adenopathy which may be the only site of measurable disease and there are some bone lesions.  (B) right upper quadrant abdominal ultrasound shows gallbladder sludge, no liver lesions   PLAN: I do not have a simple explanation for [name] has jaundice and other symptoms.  To the best of our ability to tell she has no visceral disease.  She had breast cancer in the breast and bones only.  The breast cancer of course has been removed and she only has a few more radiation treatments to complete her local therapy.  She has a return appointment with me 02/10/2021.  Depending on what we find after the ongoing evaluation we will likely start capecitabine in December.  We are sending her to the emergency room and I discussed her situation with the emergency room physician to facilitate work-up  Total encounter time 20 minutes.Sarajane Jews C. Chelsee Hosie, MD 02/01/21 6:14 PM Medical Oncology and Hematology [facility] [place], [address] [address] Tel. [phone]    Fax. [phone]   I, Wilburn Mylar, am acting as scribe for Dr. Virgie Dad. Ariane Ditullio.  I, Lurline Del MD, have reviewed the above documentation for accuracy and completeness, and I agree with the above.   *Total Encounter Time as defined by the Centers for Medicare and Medicaid Services includes, in addition to the face-to-face time of a patient visit (documented in the note  above) non-face-to-face time: obtaining and reviewing outside history, ordering and reviewing medications, tests or procedures, care coordination (communications with other health care professionals or caregivers) and documentation in the medical record.

## 2021-02-01 ENCOUNTER — Encounter (HOSPITAL_COMMUNITY): Payer: Self-pay

## 2021-02-01 ENCOUNTER — Telehealth: Payer: Self-pay

## 2021-02-01 ENCOUNTER — Inpatient Hospital Stay: Payer: BC Managed Care – PPO

## 2021-02-01 ENCOUNTER — Other Ambulatory Visit: Payer: Self-pay

## 2021-02-01 ENCOUNTER — Inpatient Hospital Stay (HOSPITAL_COMMUNITY)
Admission: EM | Admit: 2021-02-01 | Discharge: 2021-02-13 | DRG: 374 | Disposition: A | Discharge status: Home Health | Payer: BC Managed Care – PPO | Attending: Internal Medicine | Admitting: Internal Medicine

## 2021-02-01 ENCOUNTER — Ambulatory Visit: Payer: BC Managed Care – PPO

## 2021-02-01 ENCOUNTER — Emergency Department (HOSPITAL_COMMUNITY): Payer: BC Managed Care – PPO

## 2021-02-01 ENCOUNTER — Inpatient Hospital Stay (HOSPITAL_BASED_OUTPATIENT_CLINIC_OR_DEPARTMENT_OTHER): Payer: BC Managed Care – PPO | Admitting: Oncology

## 2021-02-01 ENCOUNTER — Ambulatory Visit: Admission: RE | Admit: 2021-02-01 | Payer: BC Managed Care – PPO | Source: Ambulatory Visit

## 2021-02-01 ENCOUNTER — Observation Stay (HOSPITAL_COMMUNITY): Payer: BC Managed Care – PPO

## 2021-02-01 VITALS — BP 127/77 | HR 111 | Temp 98.1°F | Resp 20 | Ht 70.0 in | Wt 201.1 lb

## 2021-02-01 DIAGNOSIS — R17 Unspecified jaundice: Secondary | ICD-10-CM

## 2021-02-01 DIAGNOSIS — D509 Iron deficiency anemia, unspecified: Secondary | ICD-10-CM | POA: Diagnosis present

## 2021-02-01 DIAGNOSIS — E861 Hypovolemia: Secondary | ICD-10-CM | POA: Diagnosis not present

## 2021-02-01 DIAGNOSIS — Z170 Estrogen receptor positive status [ER+]: Secondary | ICD-10-CM | POA: Diagnosis not present

## 2021-02-01 DIAGNOSIS — C50411 Malignant neoplasm of upper-outer quadrant of right female breast: Secondary | ICD-10-CM | POA: Diagnosis not present

## 2021-02-01 DIAGNOSIS — Z833 Family history of diabetes mellitus: Secondary | ICD-10-CM

## 2021-02-01 DIAGNOSIS — B954 Other streptococcus as the cause of diseases classified elsewhere: Secondary | ICD-10-CM | POA: Diagnosis present

## 2021-02-01 DIAGNOSIS — N1330 Unspecified hydronephrosis: Secondary | ICD-10-CM | POA: Diagnosis present

## 2021-02-01 DIAGNOSIS — Z20822 Contact with and (suspected) exposure to covid-19: Secondary | ICD-10-CM | POA: Diagnosis present

## 2021-02-01 DIAGNOSIS — Z9011 Acquired absence of right breast and nipple: Secondary | ICD-10-CM

## 2021-02-01 DIAGNOSIS — C7951 Secondary malignant neoplasm of bone: Secondary | ICD-10-CM

## 2021-02-01 DIAGNOSIS — C7889 Secondary malignant neoplasm of other digestive organs: Principal | ICD-10-CM | POA: Diagnosis present

## 2021-02-01 DIAGNOSIS — Z803 Family history of malignant neoplasm of breast: Secondary | ICD-10-CM

## 2021-02-01 DIAGNOSIS — Z23 Encounter for immunization: Secondary | ICD-10-CM

## 2021-02-01 DIAGNOSIS — G8929 Other chronic pain: Secondary | ICD-10-CM | POA: Diagnosis present

## 2021-02-01 DIAGNOSIS — Z8261 Family history of arthritis: Secondary | ICD-10-CM

## 2021-02-01 DIAGNOSIS — N1339 Other hydronephrosis: Secondary | ICD-10-CM

## 2021-02-01 DIAGNOSIS — K831 Obstruction of bile duct: Secondary | ICD-10-CM

## 2021-02-01 DIAGNOSIS — I10 Essential (primary) hypertension: Secondary | ICD-10-CM | POA: Diagnosis present

## 2021-02-01 DIAGNOSIS — Z171 Estrogen receptor negative status [ER-]: Secondary | ICD-10-CM

## 2021-02-01 DIAGNOSIS — Z87891 Personal history of nicotine dependence: Secondary | ICD-10-CM

## 2021-02-01 DIAGNOSIS — D539 Nutritional anemia, unspecified: Secondary | ICD-10-CM | POA: Diagnosis present

## 2021-02-01 DIAGNOSIS — Z8249 Family history of ischemic heart disease and other diseases of the circulatory system: Secondary | ICD-10-CM

## 2021-02-01 DIAGNOSIS — N179 Acute kidney failure, unspecified: Secondary | ICD-10-CM | POA: Diagnosis not present

## 2021-02-01 DIAGNOSIS — E871 Hypo-osmolality and hyponatremia: Secondary | ICD-10-CM | POA: Diagnosis not present

## 2021-02-01 DIAGNOSIS — K859 Acute pancreatitis without necrosis or infection, unspecified: Secondary | ICD-10-CM | POA: Diagnosis present

## 2021-02-01 DIAGNOSIS — Z79899 Other long term (current) drug therapy: Secondary | ICD-10-CM

## 2021-02-01 DIAGNOSIS — D638 Anemia in other chronic diseases classified elsewhere: Secondary | ICD-10-CM | POA: Diagnosis present

## 2021-02-01 DIAGNOSIS — R52 Pain, unspecified: Secondary | ICD-10-CM

## 2021-02-01 DIAGNOSIS — Z841 Family history of disorders of kidney and ureter: Secondary | ICD-10-CM

## 2021-02-01 DIAGNOSIS — Z801 Family history of malignant neoplasm of trachea, bronchus and lung: Secondary | ICD-10-CM

## 2021-02-01 DIAGNOSIS — Z8042 Family history of malignant neoplasm of prostate: Secondary | ICD-10-CM

## 2021-02-01 LAB — COMPREHENSIVE METABOLIC PANEL
ALT: 31 U/L (ref 0–44)
ALT: 30 U/L (ref 0–44)
AST: 33 U/L (ref 15–41)
AST: 31 U/L (ref 15–41)
Albumin: 3 g/dL — ABNORMAL LOW (ref 3.5–5.0)
Albumin: 3.1 g/dL — ABNORMAL LOW (ref 3.5–5.0)
Alkaline Phosphatase: 386 U/L — ABNORMAL HIGH (ref 38–126)
Alkaline Phosphatase: 324 U/L — ABNORMAL HIGH (ref 38–126)
Anion gap: 10 (ref 5–15)
Anion gap: 9 (ref 5–15)
BUN: 14 mg/dL (ref 6–20)
BUN: 15 mg/dL (ref 6–20)
CO2: 20 mmol/L — ABNORMAL LOW (ref 22–32)
CO2: 21 mmol/L — ABNORMAL LOW (ref 22–32)
Calcium: 10.2 mg/dL (ref 8.9–10.3)
Calcium: 10.1 mg/dL (ref 8.9–10.3)
Chloride: 104 mmol/L (ref 98–111)
Chloride: 104 mmol/L (ref 98–111)
Creatinine, Ser: 1.29 mg/dL — ABNORMAL HIGH (ref 0.44–1.00)
Creatinine, Ser: 1.17 mg/dL — ABNORMAL HIGH (ref 0.44–1.00)
GFR, Estimated: 49 mL/min — ABNORMAL LOW (ref >60)
GFR, Estimated: 55 mL/min — ABNORMAL LOW (ref >60)
Glucose, Bld: 141 mg/dL — ABNORMAL HIGH (ref 70–99)
Glucose, Bld: 107 mg/dL — ABNORMAL HIGH (ref 70–99)
Potassium: 4.1 mmol/L (ref 3.5–5.1)
Potassium: 4.1 mmol/L (ref 3.5–5.1)
Sodium: 134 mmol/L — ABNORMAL LOW (ref 135–145)
Sodium: 134 mmol/L — ABNORMAL LOW (ref 135–145)
Total Bilirubin: 11.2 mg/dL (ref 0.3–1.2)
Total Bilirubin: 10.5 mg/dL — ABNORMAL HIGH (ref 0.3–1.2)
Total Protein: 7.7 g/dL (ref 6.5–8.1)
Total Protein: 7.5 g/dL (ref 6.5–8.1)

## 2021-02-01 LAB — CBC WITH DIFFERENTIAL/PLATELET
Abs Immature Granulocytes: 0.02 10*3/uL (ref 0.00–0.07)
Abs Immature Granulocytes: 0.03 10*3/uL (ref 0.00–0.07)
Basophils Absolute: 0 10*3/uL (ref 0.0–0.1)
Basophils Absolute: 0 10*3/uL (ref 0.0–0.1)
Basophils Relative: 1 %
Basophils Relative: 1 %
Eosinophils Absolute: 0 10*3/uL (ref 0.0–0.5)
Eosinophils Absolute: 0.1 10*3/uL (ref 0.0–0.5)
Eosinophils Relative: 1 %
Eosinophils Relative: 2 %
HCT: 31.2 % — ABNORMAL LOW (ref 36.0–46.0)
HCT: 29.6 % — ABNORMAL LOW (ref 36.0–46.0)
Hemoglobin: 10.5 g/dL — ABNORMAL LOW (ref 12.0–15.0)
Hemoglobin: 9.8 g/dL — ABNORMAL LOW (ref 12.0–15.0)
Immature Granulocytes: 1 %
Immature Granulocytes: 1 %
Lymphocytes Relative: 25 %
Lymphocytes Relative: 24 %
Lymphs Abs: 0.8 10*3/uL (ref 0.7–4.0)
Lymphs Abs: 1 10*3/uL (ref 0.7–4.0)
MCH: 33.2 pg (ref 26.0–34.0)
MCH: 33.6 pg (ref 26.0–34.0)
MCHC: 33.7 g/dL (ref 30.0–36.0)
MCHC: 33.1 g/dL (ref 30.0–36.0)
MCV: 98.7 fL (ref 80.0–100.0)
MCV: 101.4 fL — ABNORMAL HIGH (ref 80.0–100.0)
Monocytes Absolute: 0.5 10*3/uL (ref 0.1–1.0)
Monocytes Absolute: 0.8 10*3/uL (ref 0.1–1.0)
Monocytes Relative: 16 %
Monocytes Relative: 20 %
Neutro Abs: 1.8 10*3/uL (ref 1.7–7.7)
Neutro Abs: 2.1 10*3/uL (ref 1.7–7.7)
Neutrophils Relative %: 56 %
Neutrophils Relative %: 52 %
Platelets: 236 10*3/uL (ref 150–400)
Platelets: 221 10*3/uL (ref 150–400)
RBC: 3.16 MIL/uL — ABNORMAL LOW (ref 3.87–5.11)
RBC: 2.92 MIL/uL — ABNORMAL LOW (ref 3.87–5.11)
RDW: 15.6 % — ABNORMAL HIGH (ref 11.5–15.5)
RDW: 15.8 % — ABNORMAL HIGH (ref 11.5–15.5)
Smear Review: NORMAL
WBC: 3.2 10*3/uL — ABNORMAL LOW (ref 4.0–10.5)
WBC: 4 10*3/uL (ref 4.0–10.5)
nRBC: 0.6 % — ABNORMAL HIGH (ref 0.0–0.2)
nRBC: 0 % (ref 0.0–0.2)

## 2021-02-01 LAB — URINALYSIS, ROUTINE W REFLEX MICROSCOPIC
Bilirubin Urine: NEGATIVE
Glucose, UA: NEGATIVE mg/dL
Hgb urine dipstick: NEGATIVE
Ketones, ur: NEGATIVE mg/dL
Leukocytes,Ua: NEGATIVE
Nitrite: NEGATIVE
Protein, ur: NEGATIVE mg/dL
Specific Gravity, Urine: 1.033 — ABNORMAL HIGH (ref 1.005–1.030)
pH: 5 (ref 5.0–8.0)

## 2021-02-01 LAB — AMYLASE: Amylase: 132 U/L — ABNORMAL HIGH (ref 28–100)

## 2021-02-01 LAB — PROTIME-INR
INR: 1.1 (ref 0.8–1.2)
Prothrombin Time: 13.7 seconds (ref 11.4–15.2)

## 2021-02-01 LAB — RESP PANEL BY RT-PCR (FLU A&B, COVID) ARPGX2
Influenza A by PCR: NEGATIVE
Influenza B by PCR: NEGATIVE
SARS Coronavirus 2 by RT PCR: NEGATIVE

## 2021-02-01 LAB — LIPASE, BLOOD: Lipase: 122 U/L — ABNORMAL HIGH (ref 11–51)

## 2021-02-01 LAB — T4, FREE: Free T4: 1.36 ng/dL — ABNORMAL HIGH (ref 0.61–1.12)

## 2021-02-01 MED ORDER — IOHEXOL 350 MG/ML SOLN
80.0000 mL | Freq: Once | INTRAVENOUS | Status: AC | PRN
  Administered 2021-02-01: 80 mL via INTRAVENOUS

## 2021-02-01 MED ORDER — SODIUM CHLORIDE 0.9 % IV BOLUS
1000.0000 mL | Freq: Once | INTRAVENOUS | Status: AC
  Administered 2021-02-01: 1000 mL via INTRAVENOUS

## 2021-02-01 MED ORDER — VITAMIN D 25 MCG (1000 UNIT) PO TABS
5000.0000 [IU] | ORAL_TABLET | Freq: Every morning | ORAL | Status: DC
  Administered 2021-02-02 – 2021-02-08 (×6): 5000 [IU] via ORAL
  Filled 2021-02-01 (×6): qty 5

## 2021-02-01 MED ORDER — DIPHENHYDRAMINE HCL 50 MG/ML IJ SOLN
12.5000 mg | Freq: Four times a day (QID) | INTRAMUSCULAR | Status: DC | PRN
  Administered 2021-02-01 – 2021-02-02 (×3): 12.5 mg via INTRAVENOUS
  Filled 2021-02-01 (×3): qty 1

## 2021-02-01 MED ORDER — DIPHENHYDRAMINE HCL 50 MG/ML IJ SOLN
12.5000 mg | Freq: Once | INTRAMUSCULAR | Status: AC
  Administered 2021-02-01: 12.5 mg via INTRAVENOUS
  Filled 2021-02-01: qty 1

## 2021-02-01 MED ORDER — PROPRANOLOL HCL 10 MG PO TABS
10.0000 mg | ORAL_TABLET | Freq: Two times a day (BID) | ORAL | Status: DC
  Administered 2021-02-01 – 2021-02-13 (×23): 10 mg via ORAL
  Filled 2021-02-01 (×23): qty 1

## 2021-02-01 MED ORDER — SPIRONOLACTONE 25 MG PO TABS
25.0000 mg | ORAL_TABLET | Freq: Two times a day (BID) | ORAL | Status: DC
  Administered 2021-02-01 – 2021-02-13 (×23): 25 mg via ORAL
  Filled 2021-02-01 (×24): qty 1

## 2021-02-01 MED ORDER — LACTATED RINGERS IV SOLN
INTRAVENOUS | Status: AC
Start: 2021-02-01 — End: 2021-02-02

## 2021-02-01 MED ORDER — VITAMIN B-12 1000 MCG PO TABS
5000.0000 ug | ORAL_TABLET | Freq: Every morning | ORAL | Status: DC
  Administered 2021-02-02 – 2021-02-08 (×6): 5000 ug via ORAL
  Filled 2021-02-01 (×5): qty 5

## 2021-02-01 MED ORDER — INFLUENZA VAC SPLIT QUAD 0.5 ML IM SUSY
0.5000 mL | PREFILLED_SYRINGE | INTRAMUSCULAR | Status: AC
  Administered 2021-02-02: 0.5 mL via INTRAMUSCULAR
  Filled 2021-02-01: qty 0.5

## 2021-02-01 NOTE — Telephone Encounter (Signed)
CRITICAL VALUE STICKER  CRITICAL VALUE: Bili = 11.2  RECEIVER (on-site recipient of call): Yetta Glassman, CMA  DATE & TIME NOTIFIED: 02/01/21 at 12:19pm  MESSENGER (representative from lab): Lelan Pons  MD NOTIFIED: [place]  TIME OF NOTIFICATION: 02/01/21 at 12:24pm  RESPONSE: Notification given to Bethann Punches., RN for follow-up with pt and provider.

## 2021-02-01 NOTE — ED Provider Notes (Signed)
[place] DEPT Provider Note   CSN: [id number] Arrival date & time: 02/01/21  1242     History Chief Complaint  Patient presents with   Shortness of Breath   Jaundice   Pruritis    [name] is a 55 y.o. female.  HPI  54 year old female past medical history of breast cancer with metastatic disease to bone undergoing current radiation presents the emergency room with concern for jaundice.  She is followed by oncology, recently started a new oral pill that was discontinued a week ago for concern of precipitating the jaundice.  However her symptoms have progressed despite being off new medication.  Patient is complaining of fatigue, nausea and whole body itching.  Denies any fever, vomiting, diarrhea, chest pain, shortness of breath, leg/abdominal swelling.  No history of any problems with her liver, gallbladder or pancreas before.  Past Medical History:  Diagnosis Date   Allergy    Arthritis 2018   hands, left knee   Breast cancer ([place]) 03-27-2020   right breast IMC   Eczema    Family history of breast cancer    Family history of lung cancer    Family history of prostate cancer    Hypertension     Patient Active Problem List   Diagnosis Date Noted   Port-A-Cath in place 12/31/2020   S/P mastectomy, right 12/07/2020   Genetic testing 04/24/2020   Bone metastases ([place]) 04/21/2020   Family history of breast cancer    Family history of lung cancer    Family history of prostate cancer    Malignant neoplasm of upper-outer quadrant of right breast in female, estrogen receptor positive ([place]) 04/03/2020   Essential hypertension, benign 02/11/2013   Other malaise and fatigue 02/11/2013   Tobacco use disorder 02/11/2013   Eczema 02/11/2013   Bacterial vaginosis 02/11/2013    Past Surgical History:  Procedure Laterality Date   ABDOMINAL HYSTERECTOMY     CESAREAN SECTION     KNEE ARTHROSCOPY W/ ACL RECONSTRUCTION Left    KNEE SURGERY      MODIFIED MASTECTOMY Right 12/07/2020   Procedure: RIGHT MODIFIED RADICAL MASTECTOMY;  Surgeon: Rolm Bookbinder, MD;  Location: [place];  Service: General;  Laterality: Right;   PORTACATH PLACEMENT Left 04/23/2020   Procedure: LEFT SIDE PORT PLACEMENT WITH ULTRASOUND GUIDANCE;  Surgeon: Rolm Bookbinder, MD;  Location: [facility];  Service: General;  Laterality: Left;  230PM START TIME PLEASE ROOM 2 THANKS   TUBAL LIGATION       OB History   No obstetric history on file.     Family History  Problem Relation Age of Onset   Breast cancer Mother 69   Arthritis Mother    Diabetes Father    Kidney disease Father    Hypertension Sister    Heart disease Maternal Grandmother    Breast cancer Paternal Grandmother        dx >50   Heart disease Maternal Aunt    Breast cancer Maternal Aunt 77   Other Maternal Aunt        brain tumor (not cancerous)   Lung cancer Maternal Aunt    Prostate cancer Cousin 56       localized    Social History   Tobacco Use   Smoking status: Former    Packs/day: 0.25    Years: 36.00    Pack years: 9.00    Types: Cigarettes    Quit date: 04/22/2017  Years since quitting: 3.7   Smokeless tobacco: Never   Tobacco comments:    She is trying to quit.    Vaping Use   Vaping Use: Never used  Substance Use Topics   Alcohol use: Yes    Alcohol/week: 3.0 standard drinks    Types: 3 Glasses of wine per week    Comment: Occasional    Drug use: Never    Home Medications Prior to Admission medications   Medication Sig Start Date End Date Taking? Authorizing Provider  acetaminophen (TYLENOL) 500 MG tablet Take 1,000 mg by mouth every 6 (six) hours as needed for moderate pain.    [provider]  Boric Acid Vaginal 600 MG SUPP Place 600 mg vaginally daily as needed (yeast infections).    [provider]  Cholecalciferol (VITAMIN D) 125 MCG (5000 UT) CAPS Take 5,000 Units by mouth daily.    [provider]  cholestyramine (QUESTRAN) 4 g packet Take 1 packet (4 g total) by mouth 2 (two) times daily for 5 days. 01/26/21 01/31/21  Sherol Dade E, PA-C  clobetasol ointment (TEMOVATE) 9.93 % Apply 1 application topically 2 (two) times daily as needed (rash/itching).    [provider]  Cyanocobalamin (B-12) 5000 MCG CAPS Take 5,000 mcg by mouth daily.    [provider]  loperamide (IMODIUM) 2 MG capsule Take 2 tablets after first diarrheal bowel movement, then one tablet after each following diarrheal movement; maximum 6 tablets/ day 10/22/20   Magrinat, Virgie Dad, MD  methocarbamol (ROBAXIN) 500 MG tablet Take 1 tablet (500 mg total) by mouth every 6 (six) hours as needed for muscle spasms. 12/08/20   Rolm Bookbinder, MD  prochlorperazine (COMPAZINE) 10 MG tablet Take 1 tablet (10 mg total) by mouth every 6 (six) hours as needed for nausea or vomiting. 10/22/20   Magrinat, Virgie Dad, MD  propranolol (INDERAL) 10 MG tablet Take 10 mg by mouth 2 (two) times daily. 06/08/20   [provider]  spironolactone (ALDACTONE) 25 MG tablet Take 1 tablet by mouth 2 (two) times daily. 08/14/20   [provider]  buPROPion (WELLBUTRIN XL) 300 MG 24 hr tablet Take 1 tablet (300 mg total) by mouth every morning. 10/04/13 02/17/20  [name], MD  losartan-hydrochlorothiazide (HYZAAR) 100-12.5 MG per tablet Take 1/2 - 1 tab daily 10/04/13 02/17/20  Zanard, Bernadene Bell, MD  telmisartan-hydrochlorothiazide (MICARDIS HCT) 80-25 MG tablet Take 1 tablet by mouth daily.  02/17/20  [provider]    Allergies    Patient has no known allergies.  Review of Systems   Review of Systems  Constitutional:  Positive for fatigue. Negative for chills and fever.  HENT:  Negative for congestion.   Eyes:  Negative for visual disturbance.  Respiratory:  Negative for shortness of breath.   Cardiovascular:  Negative for chest pain.  Gastrointestinal:  Positive for nausea.  Negative for abdominal pain, diarrhea and vomiting.  Genitourinary:  Negative for dysuria.       +dark urine  Skin:  Positive for color change. Negative for rash.  Neurological:  Negative for headaches.   Physical Exam Updated Vital Signs BP 131/85   Pulse 98   Temp 98.2 F (36.8 C) (Oral)   Resp (!) 22   Ht 5\' 10"  (1.778 m)   Wt 91.2 kg   SpO2 100%   BMI 28.85 kg/m   Physical Exam Vitals and nursing note reviewed.  Constitutional:      General: She is  not in acute distress.    Appearance: Normal appearance. She is not diaphoretic.  HENT:     Head: Normocephalic.     Mouth/Throat:     Mouth: Mucous membranes are moist.  Eyes:     Comments: Scleral icterus  Cardiovascular:     Rate and Rhythm: Normal rate.  Pulmonary:     Effort: Pulmonary effort is normal. No respiratory distress.  Abdominal:     Palpations: Abdomen is soft.     Tenderness: There is no abdominal tenderness. There is no guarding.  Musculoskeletal:     Right lower leg: No edema.     Left lower leg: No edema.  Skin:    General: Skin is warm.     Comments: Jaundice  Neurological:     Mental Status: She is alert and oriented to person, place, and time. Mental status is at baseline.  Psychiatric:        Mood and Affect: Mood normal.    ED Results / Procedures / Treatments   Labs (all labs ordered are listed, but only abnormal results are displayed) Labs Reviewed  CBC WITH DIFFERENTIAL/PLATELET - Abnormal; Notable for the following components:      Result Value   RBC 2.92 (*)    Hemoglobin 9.8 (*)    HCT 29.6 (*)    MCV 101.4 (*)    RDW 15.8 (*)    All other components within normal limits  PROTIME-INR  COMPREHENSIVE METABOLIC PANEL  LIPASE, BLOOD  URINALYSIS, ROUTINE W REFLEX MICROSCOPIC    EKG None  Radiology No results found.  Procedures Procedures   Medications Ordered in ED Medications - No data to display  ED Course  I have reviewed the triage vital signs and the nursing  notes.  Pertinent labs & imaging results that were available during my care of the patient were reviewed by me and considered in my medical decision making (see chart for details).    MDM Rules/Calculators/A&P                           55 year old female presents to the emergency department with concern for new onset jaundice.  Patient is currently undergoing cancer treatment.  Vital signs are stable on arrival, abdomen is nondistended.  Patient has obvious jaundice on exam.  Blood work shows baseline anemia, confirms elevated bilirubin up to 10.5, normal LFTs, alkaline phosphatase is elevated, lipase is 122.  Amylase is still pending.  Coagulation studies are normal.  Plan for abdominal imaging, GI consult and possible admission.  Final Clinical Impression(s) / ED Diagnoses Final diagnoses:  None    Rx / DC Orders ED Discharge Orders     None        Lorelle Gibbs, DO 02/01/21 1455

## 2021-02-01 NOTE — ED Provider Notes (Signed)
Emergency Medicine Provider Triage Evaluation Note  [name] , a 55 y.o. female  was evaluated in triage.  Pt complains of itching all over and yellowing of her eyes.  She states last Monday she began having yellowing of her eyes and itchiness from head to toe.  She also endorses darkened urine, light stools, nausea without vomiting.  She denies fever or diarrhea, abdominal pain, chest pain.  She endorses shortness of breath.  Last radiation treatment yesterday.  She has stage IV breast cancer with bone metastases.  Review of Systems  Positive: See above Negative:   Physical Exam  There were no vitals taken for this visit. Gen:   Awake, no distress   Resp:  Tachypneic MSK:   Moves extremities without difficulty  Other:  Scleral icterus.  Oropharynx is clear with pale gums.  Lungs clear.  Abdomen is soft and nontender.  No rash present.  Medical Decision Making  Medically screening exam initiated at 12:58 PM.  Appropriate orders placed.  BETSABE IGLESIA was informed that the remainder of the evaluation will be completed by another provider, this initial triage assessment does not replace that evaluation, and the importance of remaining in the ED until their evaluation is complete.     Mickie Hillier, PA-C 02/01/21 1259    Davonna Belling, MD 02/02/21 [phone]

## 2021-02-01 NOTE — H&P (Signed)
History and Physical    [name] MBW:[id number] DOB: 1965-10-13 DOA: 02/01/2021  PCP: Kara Resides, MD  Patient coming from: Home.  Chief Complaint: Jaundice.  HPI: [name] is a 55 y.o. female with history of metastatic breast cancer was recently experiencing increasing itching and had followed up with her oncologist who did labs and found her bilirubin was increased.  It was thought that her immunotherapy  Verzenio could be causing it and was stopped.  Despite which patient's bilirubin got worse along with being symptomatic was referred to the ER.  Patient states about 4 days ago she did have episodes of nausea vomiting but denies any abdominal pain has been having decreased volume stools which are stringy.  Denies any fever chills.  ED Course: In the ER patient appears jaundiced and abdomen appears benign on exam.  Labs show lipase of 122 amylase of 132 total bilirubin of 10.5 with AST ALT being normal and alkaline phosphatase is 324.  Hemoglobin is 9.8 which is around her baseline.  Patient underwent CT abdomen pelvis which showed features concerning for new substantial intrahepatic and biliary ductal dilatation and dorsal pancreatic ductal dilatation with also enhancement of the common hepatic and common bile duct with gallbladder wall thickening sludge and also features concerning for pancreatitis.  Other differentials including cholangitis and cholecystitis.  ER physician discussed with Dr. Collene Franco on-call gastroenterologist will be seeing patient in consult.  Patient admitted for further work-up.  COVID test was negative.  Review of Systems: As per HPI, rest all negative.   Past Medical History:  Diagnosis Date   Allergy    Arthritis 2018   hands, left knee   Breast cancer ([place]) 03-27-2020   right breast IMC   Eczema    Family history of breast cancer    Family history of lung cancer    Family history of prostate cancer    Hypertension     Past Surgical History:   Procedure Laterality Date   ABDOMINAL HYSTERECTOMY     CESAREAN SECTION     KNEE ARTHROSCOPY W/ ACL RECONSTRUCTION Left    KNEE SURGERY     MODIFIED MASTECTOMY Right 12/07/2020   Procedure: RIGHT MODIFIED RADICAL MASTECTOMY;  Surgeon: Kara Bookbinder, MD;  Location: [facility];  Service: General;  Laterality: Right;   PORTACATH PLACEMENT Left 04/23/2020   Procedure: LEFT SIDE PORT PLACEMENT WITH ULTRASOUND GUIDANCE;  Surgeon: Kara Bookbinder, MD;  Location: [place];  Service: General;  Laterality: Left;  230PM START TIME PLEASE ROOM 2 THANKS   TUBAL LIGATION       reports that she quit smoking about 3 years ago. Her smoking use included cigarettes. She has a 9.00 pack-year smoking history. She has never used smokeless tobacco. She reports current alcohol use of about 3.0 standard drinks per week. She reports that she does not use drugs.  No Known Allergies  Family History  Problem Relation Age of Onset   Breast cancer Mother 64   Arthritis Mother    Diabetes Father    Kidney disease Father    Hypertension Sister    Heart disease Maternal Grandmother    Breast cancer Paternal Grandmother        dx >50   Heart disease Maternal Aunt    Breast cancer Maternal Aunt 77   Other Maternal Aunt        brain tumor (not cancerous)   Lung cancer Maternal Aunt    Prostate cancer  Cousin 13       localized    Prior to Admission medications   Medication Sig Start Date End Date Taking? Authorizing Provider  acetaminophen (TYLENOL) 500 MG tablet Take 1,000 mg by mouth every 6 (six) hours as needed for moderate pain.   Yes [provider]  Boric Acid Vaginal 600 MG SUPP Place 600 mg vaginally daily as needed (yeast infections).   Yes [provider]  Cholecalciferol (VITAMIN D) 125 MCG (5000 UT) CAPS Take 5,000 Units by mouth every morning.   Yes [provider]  clobetasol ointment (TEMOVATE) 2.54 % Apply 1 application topically  2 (two) times daily as needed (rash/itching).   Yes [provider]  Cyanocobalamin (B-12) 5000 MCG CAPS Take 5,000 mcg by mouth every morning.   Yes [provider]  loperamide (IMODIUM) 2 MG capsule Take 2 tablets after first diarrheal bowel movement, then one tablet after each following diarrheal movement; maximum 6 tablets/ day Patient taking differently: 2-4 mg See admin instructions. Take 2 tablets  (4 mg) by mouth after first diarrheal bowel movement, then one tablet (2 mg) after each following diarrheal movement; maximum 6 tablets/ day 10/22/20  Yes Magrinat, Virgie Dad, MD  methocarbamol (ROBAXIN) 500 MG tablet Take 1 tablet (500 mg total) by mouth every 6 (six) hours as needed for muscle spasms. 12/08/20  Yes Kara Bookbinder, MD  prochlorperazine (COMPAZINE) 10 MG tablet Take 1 tablet (10 mg total) by mouth every 6 (six) hours as needed for nausea or vomiting. 10/22/20  Yes Magrinat, Virgie Dad, MD  propranolol (INDERAL) 10 MG tablet Take 10 mg by mouth 2 (two) times daily. 06/08/20  Yes [provider]  spironolactone (ALDACTONE) 25 MG tablet Take 25 mg by mouth 2 (two) times daily. 08/14/20  Yes [provider]  cholestyramine (QUESTRAN) 4 g packet Take 1 packet (4 g total) by mouth 2 (two) times daily for 5 days. Patient not taking: Reported on 02/01/2021 01/26/21 01/31/21  Barrie Folk, PA-C  buPROPion (WELLBUTRIN XL) 300 MG 24 hr tablet Take 1 tablet (300 mg total) by mouth every morning. 10/04/13 02/17/20  Kara Resides, MD  losartan-hydrochlorothiazide [facility]) 100-12.5 MG per tablet Take 1/2 - 1 tab daily 10/04/13 02/17/20  Zanard, Bernadene Bell, MD  telmisartan-hydrochlorothiazide (MICARDIS HCT) 80-25 MG tablet Take 1 tablet by mouth daily.  02/17/20  [provider]    Physical Exam: Constitutional: Moderately built and nourished. Vitals:   02/01/21 1745 02/01/21 1800 02/01/21 1815 02/01/21 1830  BP:    125/86  Pulse: (!) 101 (!) 101 (!)  102 97  Resp: 16 15 20  (!) 24  Temp:      TempSrc:      SpO2: 98% 99% 99% 98%  Weight:      Height:       Eyes: Icterus present no pallor. ENMT: No discharge from the ears eyes nose and mouth. Neck: No mass felt.  No neck rigidity. Respiratory: No rhonchi or crepitations. Cardiovascular: S1-S2 heard. Abdomen: Soft nontender bowel sound present. Musculoskeletal: No edema. Skin: No rash. Neurologic: Alert awake oriented to time place and person.  Moves all extremities. Psychiatric: Appears normal.  Normal affect.   Labs on Admission: I have personally reviewed following labs and imaging studies  CBC: Recent Labs  Lab 01/26/21 1220 01/28/21 0950 02/01/21 1109 02/01/21 1418  WBC 3.1* 2.7* 3.2* 4.0  NEUTROABS 1.9 1.9 1.8 2.1  HGB 10.4* 9.9* 10.5* 9.8*  HCT 32.6* 30.9* 31.2* 29.6*  MCV 101.6* 102.7* 98.7 101.4*  PLT 268 241 236 672   Basic Metabolic Panel: Recent Labs  Lab 01/26/21 1220 01/28/21 0950 02/01/21 1109 02/01/21 1418  NA 137 137 134* 134*  K 3.9 3.8 4.1 4.1  CL 106 107 104 104  CO2 22 20* 20* 21*  GLUCOSE 122* 140* 141* 107*  BUN 13 15 14 15   CREATININE 0.87 1.20* 1.29* 1.17*  CALCIUM 10.4* 10.0 10.2 10.1   GFR: Estimated Creatinine Clearance: 67.3 mL/min (A) (by C-G formula based on SCr of 1.17 mg/dL (H)). Liver Function Tests: Recent Labs  Lab 01/26/21 1220 01/28/21 0950 02/01/21 1109 02/01/21 1418  AST 26 26 33 31  ALT 36 30 31 30   ALKPHOS 315* 349* 386* 324*  BILITOT 7.3* 8.6* 11.2* 10.5*  PROT 8.1 7.7 7.7 7.5  ALBUMIN 3.4* 3.2* 3.0* 3.1*   Recent Labs  Lab 02/01/21 1418 02/01/21 1454  LIPASE 122*  --   AMYLASE  --  132*   No results for input(s): AMMONIA in the last 168 hours. Coagulation Profile: Recent Labs  Lab 02/01/21 1418  INR 1.1   Cardiac Enzymes: No results for input(s): CKTOTAL, CKMB, CKMBINDEX, TROPONINI in the last 168 hours. BNP (last 3 results) No results for input(s): PROBNP in the last 8760  hours. HbA1C: No results for input(s): HGBA1C in the last 72 hours. CBG: No results for input(s): GLUCAP in the last 168 hours. Lipid Profile: No results for input(s): CHOL, HDL, LDLCALC, TRIG, CHOLHDL, LDLDIRECT in the last 72 hours. Thyroid Function Tests: Recent Labs    02/01/21 1109  FREET4 1.36*   Anemia Panel: No results for input(s): VITAMINB12, FOLATE, FERRITIN, TIBC, IRON, RETICCTPCT in the last 72 hours. Urine analysis:    Component Value Date/Time   COLORURINE AMBER (A) 02/01/2021 1658   APPEARANCEUR CLEAR 02/01/2021 1658   LABSPEC 1.033 (H) 02/01/2021 1658   PHURINE 5.0 02/01/2021 1658   GLUCOSEU NEGATIVE 02/01/2021 1658   HGBUR NEGATIVE 02/01/2021 [place] 02/01/2021 [place] 02/01/2021 1658   PROTEINUR NEGATIVE 02/01/2021 1658   NITRITE NEGATIVE 02/01/2021 1658   LEUKOCYTESUR NEGATIVE 02/01/2021 1658   Sepsis Labs: @LABRCNTIP (procalcitonin:4,lacticidven:4) )No results found for this or any previous visit (from the past 240 hour(s)).   Radiological Exams on Admission: CT Abdomen Pelvis W Contrast  Result Date: 02/01/2021 CLINICAL DATA:  Itching in jaundice for 7 days. Shortness of breath. Dark colored urine. Nausea. Radiation therapy yesterday. History of breast cancer with osseous metastatic disease. EXAM: CT ABDOMEN AND PELVIS WITH CONTRAST TECHNIQUE: Multidetector CT imaging of the abdomen and pelvis was performed using the standard protocol following bolus administration of intravenous contrast. CONTRAST:  70mL OMNIPAQUE IOHEXOL 350 MG/ML SOLN COMPARISON:  12/29/2020 FINDINGS: Lower chest: Right mastectomy. Stable scarring in the right lower lobe. Hepatobiliary: New substantial intrahepatic biliary dilatation observed along with gallbladder wall thickening and high density material in the gallbladder which could be from sludge, proteinaceous fluid, or blood products. There is narrowing and substantial wall enhancement in the  right and left hepatic ducts as they converge, raising the possibility of cholangitis. Ill definition of the common bile duct probably from wall thickening. No well-defined stones. Pancreas: Stranding around the pancreatic head along with new mildly dilated dorsal pancreatic duct with truncation of caliber in the pancreatic head region, appearance raises some suspicion for focal pancreatitis. I do not see a specific well-defined mass in the pancreatic head. Spleen: Unremarkable Adrenals/Urinary Tract: New abnormal hydronephrosis and proximal hydroureter  extending down to a 8.5 cm dilated segment of the right distal ureter which contains high internal density; this is new compared to the 12/29/2020 exam and accordingly raises suspicion for a blood clot rather than tumor. Mildly delayed nephrogram on the right. No well-defined stone. Adrenal glands unremarkable. Urinary bladder unremarkable. Stomach/Bowel: No dilated bowel observed.  Normal appendix. Vascular/Lymphatic: Left periaortic lymph node 1.3 cm in short axis on image 42 series 2, previously 1.9 cm. Other retroperitoneal lymph nodes are likewise improved compared to previous. Reproductive: Uterus absent.  Adnexa unremarkable. Other: Stable mild retroperitoneal stranding. Musculoskeletal: Stable widespread scattered sclerotic lesions indicative of prior osseous metastatic disease. Small umbilical hernia contains adipose tissue. IMPRESSION: 1. Since 12/29/2020, there is new substantial intrahepatic biliary dilatation along with dorsal pancreatic duct dilatation. There seems to be abnormal enhancement and wall thickening in the common hepatic duct and common bile duct, along with gallbladder wall thickening and gallbladder sludge. In addition there is some stranding around the pancreatic head with truncation of the dilated dorsal pancreatic duct. Possibilities may include cholangitis and/or focal pancreatitis involving the pancreatic head. Acute cholecystitis not  excluded. A discrete pancreatic head mass is not well seen. 2. In addition, there is new substantial right hydronephrosis and delayed nephrogram on the right extending down to a tapering plug of high density material in the proximal ureter, presumably representing a blood clot in the ureter given that the kidney and ureter had a normal appearance 1 month ago. Tumor in the ureter seems unlikely in this time frame. Severe ureteritis might be a differential diagnostic consideration. 3. Mildly reduced retroperitoneal adenopathy, although the retroperitoneal stranding continues. 4. Stable widespread scattered sclerotic lesions indicative of prior metastatic disease. Electronically Signed   By: Van Clines M.D.   On: 02/01/2021 16:31      Assessment/Plan Principal Problem:   Jaundice Active Problems:   Essential hypertension, benign   Malignant neoplasm of upper-outer quadrant of right breast in female, estrogen receptor positive (HCC)    Jaundice -cause not clear with CT scan showing intraductal hepatic ductal dilation also dilatation of the pancreatic duct with the differentials including cholangitis pancreatitis and cholecystitis not ruled out.  Patient at this time is afebrile abdomen appears benign on exam.  We will get MRCP and gastroenterologist Dr. Collene Franco has been consulted for further recommendation.  Follow LFT check acute hepatitis panel check direct and indirect bilirubin.  Gently hydrate for now. Abnormal features seen in the right ureter with hydronephrosis on CAT scan.  Will check UA urine cultures and get urology consult. History of hypertension on propranolol and spironolactone. Abnormal LFTs seen during recent blood draw.  Not sure why the LFTs were done.  Presently appears to be asymptomatic with elevated free T4.  Closely observe. Metastatic breast cancer being followed by Dr. [name].   DVT prophylaxis: SCDs.  Avoiding anticoagulation in the setting of worsening LFTs and may  need further procedures we will hold off anticoagulation. Code Status: Full code. Family Communication: Discussed with patient. Disposition Plan: Home. Consults called: Gastroenterologist. Admission status: Observation for   Rise Patience MD [facility] Pager [phone].  If 7PM-7AM, please contact night-coverage [email or web address] Password [facility]  02/01/2021, 8:01 PM

## 2021-02-01 NOTE — ED Provider Notes (Signed)
Care was taken over from Dr. Dina Rich.  Patient is currently being treated for metastatic breast cancer.  She came in for hyperbilirubinemia with jaundice.  She has no associate abdominal pain.  Her AST and ALT are normal.  Her lipase is mildly elevated.  Her AST is elevated.  CT scan was performed which showed dilatation of the intrahepatic biliary ducts.  There is also some gallbladder wall thickening and gallbladder sludge.  There is also some unilateral hydronephrosis with a questionable blood clot in the ureter.  She has no fever.  No elevated white count.  I spoke with Dr. Collene Mares with GI.  She has previously had a colonoscopy with Dr. Collene Mares.  Dr. Benson Norway will see the patient tomorrow.  She does not recommend ordering any further imaging such as MRCP at this point.  They will decide on that tomorrow.  Antibiotics were not given at this point due to the lack of fever/elevated WBC count.  I spoke with Dr. Hal Hope who will admit the patient for further treatment.   Malvin Johns, MD 02/01/21 [phone]

## 2021-02-01 NOTE — ED Triage Notes (Signed)
Patient c/o itching, jaundice x 7 days, and SOB. Patient  also c/o dark urine and nausea. Patient reports that she had a radiation treatment yesterday.

## 2021-02-02 ENCOUNTER — Observation Stay (HOSPITAL_COMMUNITY): Payer: BC Managed Care – PPO | Admitting: Anesthesiology

## 2021-02-02 ENCOUNTER — Inpatient Hospital Stay (HOSPITAL_COMMUNITY): Payer: BC Managed Care – PPO

## 2021-02-02 ENCOUNTER — Encounter (HOSPITAL_COMMUNITY): Payer: Self-pay

## 2021-02-02 ENCOUNTER — Ambulatory Visit: Payer: BC Managed Care – PPO

## 2021-02-02 ENCOUNTER — Encounter (HOSPITAL_COMMUNITY)
Admission: EM | Disposition: A | Discharge status: Home Health | Payer: Self-pay | Source: Home / Self Care | Attending: Family Medicine

## 2021-02-02 DIAGNOSIS — I10 Essential (primary) hypertension: Secondary | ICD-10-CM | POA: Diagnosis present

## 2021-02-02 DIAGNOSIS — E871 Hypo-osmolality and hyponatremia: Secondary | ICD-10-CM | POA: Diagnosis not present

## 2021-02-02 DIAGNOSIS — Z841 Family history of disorders of kidney and ureter: Secondary | ICD-10-CM | POA: Diagnosis not present

## 2021-02-02 DIAGNOSIS — Z8261 Family history of arthritis: Secondary | ICD-10-CM | POA: Diagnosis not present

## 2021-02-02 DIAGNOSIS — C7951 Secondary malignant neoplasm of bone: Secondary | ICD-10-CM | POA: Diagnosis present

## 2021-02-02 DIAGNOSIS — B954 Other streptococcus as the cause of diseases classified elsewhere: Secondary | ICD-10-CM | POA: Diagnosis present

## 2021-02-02 DIAGNOSIS — E861 Hypovolemia: Secondary | ICD-10-CM | POA: Diagnosis not present

## 2021-02-02 DIAGNOSIS — Z87891 Personal history of nicotine dependence: Secondary | ICD-10-CM | POA: Diagnosis not present

## 2021-02-02 DIAGNOSIS — R0602 Shortness of breath: Secondary | ICD-10-CM | POA: Diagnosis present

## 2021-02-02 DIAGNOSIS — Z23 Encounter for immunization: Secondary | ICD-10-CM | POA: Diagnosis not present

## 2021-02-02 DIAGNOSIS — G8929 Other chronic pain: Secondary | ICD-10-CM | POA: Diagnosis present

## 2021-02-02 DIAGNOSIS — D509 Iron deficiency anemia, unspecified: Secondary | ICD-10-CM | POA: Diagnosis present

## 2021-02-02 DIAGNOSIS — N1330 Unspecified hydronephrosis: Secondary | ICD-10-CM | POA: Diagnosis present

## 2021-02-02 DIAGNOSIS — D539 Nutritional anemia, unspecified: Secondary | ICD-10-CM | POA: Diagnosis present

## 2021-02-02 DIAGNOSIS — R17 Unspecified jaundice: Secondary | ICD-10-CM | POA: Diagnosis not present

## 2021-02-02 DIAGNOSIS — C50411 Malignant neoplasm of upper-outer quadrant of right female breast: Secondary | ICD-10-CM | POA: Diagnosis present

## 2021-02-02 DIAGNOSIS — Z9011 Acquired absence of right breast and nipple: Secondary | ICD-10-CM | POA: Diagnosis not present

## 2021-02-02 DIAGNOSIS — Z801 Family history of malignant neoplasm of trachea, bronchus and lung: Secondary | ICD-10-CM | POA: Diagnosis not present

## 2021-02-02 DIAGNOSIS — Z20822 Contact with and (suspected) exposure to covid-19: Secondary | ICD-10-CM | POA: Diagnosis present

## 2021-02-02 DIAGNOSIS — Z8249 Family history of ischemic heart disease and other diseases of the circulatory system: Secondary | ICD-10-CM | POA: Diagnosis not present

## 2021-02-02 DIAGNOSIS — D638 Anemia in other chronic diseases classified elsewhere: Secondary | ICD-10-CM | POA: Diagnosis present

## 2021-02-02 DIAGNOSIS — Z803 Family history of malignant neoplasm of breast: Secondary | ICD-10-CM | POA: Diagnosis not present

## 2021-02-02 DIAGNOSIS — K859 Acute pancreatitis without necrosis or infection, unspecified: Secondary | ICD-10-CM | POA: Diagnosis present

## 2021-02-02 DIAGNOSIS — C7889 Secondary malignant neoplasm of other digestive organs: Secondary | ICD-10-CM | POA: Diagnosis present

## 2021-02-02 DIAGNOSIS — N179 Acute kidney failure, unspecified: Secondary | ICD-10-CM | POA: Diagnosis not present

## 2021-02-02 DIAGNOSIS — Z833 Family history of diabetes mellitus: Secondary | ICD-10-CM | POA: Diagnosis not present

## 2021-02-02 HISTORY — PX: ERCP: SHX5425

## 2021-02-02 HISTORY — PX: SPHINCTEROTOMY: SHX5544

## 2021-02-02 LAB — HEPATIC FUNCTION PANEL
ALT: 29 U/L (ref 0–44)
AST: 32 U/L (ref 15–41)
Albumin: 2.8 g/dL — ABNORMAL LOW (ref 3.5–5.0)
Alkaline Phosphatase: 323 U/L — ABNORMAL HIGH (ref 38–126)
Bilirubin, Direct: 6.2 mg/dL — ABNORMAL HIGH (ref 0.0–0.2)
Indirect Bilirubin: 3.6 mg/dL — ABNORMAL HIGH (ref 0.3–0.9)
Total Bilirubin: 9.8 mg/dL — ABNORMAL HIGH (ref 0.3–1.2)
Total Protein: 6.7 g/dL (ref 6.5–8.1)

## 2021-02-02 LAB — HIV ANTIBODY (ROUTINE TESTING W REFLEX): HIV Screen 4th Generation wRfx: NONREACTIVE

## 2021-02-02 LAB — BASIC METABOLIC PANEL
Anion gap: 7 (ref 5–15)
BUN: 12 mg/dL (ref 6–20)
CO2: 22 mmol/L (ref 22–32)
Calcium: 9.7 mg/dL (ref 8.9–10.3)
Chloride: 106 mmol/L (ref 98–111)
Creatinine, Ser: 1.09 mg/dL — ABNORMAL HIGH (ref 0.44–1.00)
GFR, Estimated: >60 mL/min (ref >60)
Glucose, Bld: 109 mg/dL — ABNORMAL HIGH (ref 70–99)
Potassium: 4 mmol/L (ref 3.5–5.1)
Sodium: 135 mmol/L (ref 135–145)

## 2021-02-02 LAB — CBC WITH DIFFERENTIAL/PLATELET
Abs Immature Granulocytes: 0.02 10*3/uL (ref 0.00–0.07)
Basophils Absolute: 0 10*3/uL (ref 0.0–0.1)
Basophils Relative: 1 %
Eosinophils Absolute: 0 10*3/uL (ref 0.0–0.5)
Eosinophils Relative: 1 %
HCT: 27.1 % — ABNORMAL LOW (ref 36.0–46.0)
Hemoglobin: 8.8 g/dL — ABNORMAL LOW (ref 12.0–15.0)
Immature Granulocytes: 1 %
Lymphocytes Relative: 25 %
Lymphs Abs: 1 10*3/uL (ref 0.7–4.0)
MCH: 33.2 pg (ref 26.0–34.0)
MCHC: 32.5 g/dL (ref 30.0–36.0)
MCV: 102.3 fL — ABNORMAL HIGH (ref 80.0–100.0)
Monocytes Absolute: 0.8 10*3/uL (ref 0.1–1.0)
Monocytes Relative: 21 %
Neutro Abs: 2 10*3/uL (ref 1.7–7.7)
Neutrophils Relative %: 51 %
Platelets: 245 10*3/uL (ref 150–400)
RBC: 2.65 MIL/uL — ABNORMAL LOW (ref 3.87–5.11)
RDW: 16.1 % — ABNORMAL HIGH (ref 11.5–15.5)
WBC: 3.8 10*3/uL — ABNORMAL LOW (ref 4.0–10.5)
nRBC: 0.5 % — ABNORMAL HIGH (ref 0.0–0.2)

## 2021-02-02 LAB — PROTIME-INR
INR: 1 (ref 0.8–1.2)
Prothrombin Time: 13.3 seconds (ref 11.4–15.2)

## 2021-02-02 SURGERY — ERCP, WITH INTERVENTION IF INDICATED
Anesthesia: General

## 2021-02-02 MED ORDER — ONDANSETRON HCL 4 MG/2ML IJ SOLN
INTRAMUSCULAR | Status: DC | PRN
  Administered 2021-02-02: 4 mg via INTRAVENOUS

## 2021-02-02 MED ORDER — GLUCAGON HCL RDNA (DIAGNOSTIC) 1 MG IJ SOLR
INTRAMUSCULAR | Status: AC
  Filled 2021-02-02: qty 2

## 2021-02-02 MED ORDER — DEXAMETHASONE SODIUM PHOSPHATE 10 MG/ML IJ SOLN
INTRAMUSCULAR | Status: DC | PRN
  Administered 2021-02-02: 10 mg via INTRAVENOUS

## 2021-02-02 MED ORDER — DIPHENHYDRAMINE HCL 50 MG/ML IJ SOLN
25.0000 mg | Freq: Four times a day (QID) | INTRAMUSCULAR | Status: DC | PRN
  Administered 2021-02-02: 21:00:00 25 mg via INTRAVENOUS
  Filled 2021-02-02: qty 1

## 2021-02-02 MED ORDER — MIDAZOLAM HCL 5 MG/5ML IJ SOLN
INTRAMUSCULAR | Status: DC | PRN
  Administered 2021-02-02 (×2): 1 mg via INTRAVENOUS

## 2021-02-02 MED ORDER — CIPROFLOXACIN IN D5W 400 MG/200ML IV SOLN
INTRAVENOUS | Status: DC | PRN
  Administered 2021-02-02: 400 mg via INTRAVENOUS

## 2021-02-02 MED ORDER — ACETAMINOPHEN 325 MG PO TABS
650.0000 mg | ORAL_TABLET | Freq: Four times a day (QID) | ORAL | Status: DC | PRN
  Administered 2021-02-06 – 2021-02-12 (×12): 650 mg via ORAL
  Filled 2021-02-02 (×13): qty 2

## 2021-02-02 MED ORDER — SUGAMMADEX SODIUM 200 MG/2ML IV SOLN
INTRAVENOUS | Status: DC | PRN
  Administered 2021-02-02: 200 mg via INTRAVENOUS

## 2021-02-02 MED ORDER — IPRATROPIUM-ALBUTEROL 0.5-2.5 (3) MG/3ML IN SOLN: 3.0000 mL | RESPIRATORY_TRACT | Status: DC | PRN

## 2021-02-02 MED ORDER — BOOST / RESOURCE BREEZE PO LIQD CUSTOM
1.0000 | Freq: Three times a day (TID) | ORAL | Status: DC
  Administered 2021-02-02 – 2021-02-04 (×4): 1 via ORAL

## 2021-02-02 MED ORDER — TRAZODONE HCL 50 MG PO TABS
50.0000 mg | ORAL_TABLET | Freq: Every evening | ORAL | Status: DC | PRN
  Administered 2021-02-07: 21:00:00 50 mg via ORAL
  Filled 2021-02-02: qty 1

## 2021-02-02 MED ORDER — DEXMEDETOMIDINE (PRECEDEX) IN NS 20 MCG/5ML (4 MCG/ML) IV SYRINGE
PREFILLED_SYRINGE | INTRAVENOUS | Status: AC
  Filled 2021-02-02: qty 20

## 2021-02-02 MED ORDER — ADULT MULTIVITAMIN W/MINERALS CH
1.0000 | ORAL_TABLET | Freq: Every day | ORAL | Status: DC
Start: 2021-02-02 — End: 2021-02-13
  Administered 2021-02-02 – 2021-02-13 (×10): 1 via ORAL
  Filled 2021-02-02 (×10): qty 1

## 2021-02-02 MED ORDER — METOPROLOL TARTRATE 5 MG/5ML IV SOLN: 5.0000 mg | INTRAVENOUS | Status: DC | PRN

## 2021-02-02 MED ORDER — FENTANYL CITRATE (PF) 100 MCG/2ML IJ SOLN
INTRAMUSCULAR | Status: AC
  Filled 2021-02-02: qty 2

## 2021-02-02 MED ORDER — HYDRALAZINE HCL 20 MG/ML IJ SOLN: 10.0000 mg | INTRAMUSCULAR | Status: DC | PRN

## 2021-02-02 MED ORDER — GUAIFENESIN 100 MG/5ML PO LIQD: 5.0000 mL | ORAL | Status: DC | PRN

## 2021-02-02 MED ORDER — LIDOCAINE 2% (20 MG/ML) 5 ML SYRINGE
INTRAMUSCULAR | Status: DC | PRN
  Administered 2021-02-02: 40 mg via INTRAVENOUS

## 2021-02-02 MED ORDER — ROCURONIUM BROMIDE 100 MG/10ML IV SOLN
INTRAVENOUS | Status: DC | PRN
  Administered 2021-02-02: 10 mg via INTRAVENOUS
  Administered 2021-02-02: 40 mg via INTRAVENOUS
  Administered 2021-02-02: 10 mg via INTRAVENOUS

## 2021-02-02 MED ORDER — FENTANYL CITRATE (PF) 100 MCG/2ML IJ SOLN
INTRAMUSCULAR | Status: DC | PRN
  Administered 2021-02-02 (×2): 50 ug via INTRAVENOUS

## 2021-02-02 MED ORDER — CHLORHEXIDINE GLUCONATE CLOTH 2 % EX PADS
6.0000 | MEDICATED_PAD | Freq: Every day | CUTANEOUS | Status: DC
  Administered 2021-02-02 – 2021-02-12 (×12): 6 via TOPICAL

## 2021-02-02 MED ORDER — PROPOFOL 10 MG/ML IV BOLUS
INTRAVENOUS | Status: DC | PRN
  Administered 2021-02-02: 130 mg via INTRAVENOUS

## 2021-02-02 MED ORDER — SODIUM CHLORIDE 0.9 % IV SOLN: INTRAVENOUS | Status: DC

## 2021-02-02 MED ORDER — MIDAZOLAM HCL 2 MG/2ML IJ SOLN
INTRAMUSCULAR | Status: AC
  Filled 2021-02-02: qty 2

## 2021-02-02 MED ORDER — PHENYLEPHRINE 40 MCG/ML (10ML) SYRINGE FOR IV PUSH (FOR BLOOD PRESSURE SUPPORT)
PREFILLED_SYRINGE | INTRAVENOUS | Status: DC | PRN
  Administered 2021-02-02: 80 ug via INTRAVENOUS

## 2021-02-02 MED ORDER — SODIUM CHLORIDE 0.9 % IV SOLN
INTRAVENOUS | Status: DC | PRN
  Administered 2021-02-02: 10 mL

## 2021-02-02 MED ORDER — INDOMETHACIN 50 MG RE SUPP
RECTAL | Status: AC
  Filled 2021-02-02: qty 2

## 2021-02-02 MED ORDER — CIPROFLOXACIN IN D5W 400 MG/200ML IV SOLN
INTRAVENOUS | Status: AC
  Filled 2021-02-02: qty 200

## 2021-02-02 NOTE — Progress Notes (Signed)
Initial Nutrition Assessment  DOCUMENTATION CODES:  Not applicable  INTERVENTION:  Advance diet as medically able and as tolerated.  Add Boost Breeze po TID, each supplement provides 250 kcal and 9 grams of protein.  Add MVI with minerals daily.  Encourage PO and supplement intake.  NUTRITION DIAGNOSIS:  Increased nutrient needs related to cancer and cancer related treatments as evidenced by estimated needs.  GOAL:  Patient will meet greater than or equal to 90% of their needs  MONITOR:  Diet advancement, PO intake, Supplement acceptance, Labs, Weight trends, I & O's  REASON FOR ASSESSMENT:  Malnutrition Screening Tool    ASSESSMENT:  55 yo female with a PMH of metastatic breast cancer was recently experiencing increasing itching and had followed up with her oncologist who did labs and found her bilirubin was increased.  It was thought that her immunotherapy  Verzenio could be causing it and was stopped.  Despite which patient's bilirubin got worse along with being symptomatic was referred to the ER.  Patient states about 4 days ago she did have episodes of nausea vomiting but denies any abdominal pain has been having decreased volume stools which are stringy. Admitted with jaundice.  Spoke with pt at bedside. Pt reports that she woke up about 2-3 days PTA with her sense of taste being altered. She reports that nothing tasted like it should have. She reports drinking smoothies with [place] yogurt, pineapple juice, and chia seeds.  She is open to drinking Boost Breeze while on clears and open to another supplement once her diet progresses.  Per Epic, pt has lost ~14 lbs (6.4%) in the last 6 months, which is not necessarily significant for the time frame.  Recommend adding MVI with minerals daily and Boost Breeze TID.  Medications: reviewed; Vitamin D3, spironolactone BID, Vitamin B12, LR @ 75 ml/hr  Labs: reviewed; Glucose 109 (H)  NUTRITION - FOCUSED PHYSICAL EXAM: Flowsheet  Row Most Recent Value  Orbital Region Mild depletion  Upper Arm Region No depletion  Thoracic and Lumbar Region No depletion  Buccal Region No depletion  Temple Region Mild depletion  Clavicle Bone Region No depletion  Clavicle and Acromion Bone Region No depletion  Scapular Bone Region No depletion  Dorsal Hand No depletion  Patellar Region No depletion  Anterior Thigh Region No depletion  Posterior Calf Region Mild depletion  Edema (RD Assessment) None  Hair Reviewed  Eyes Reviewed  Mouth Reviewed  Skin Reviewed  Nails Reviewed   Diet Order:   Diet Order             Diet NPO time specified  Diet effective now                  EDUCATION NEEDS:  Education needs have been addressed  Skin:  Skin Assessment: Reviewed RN Assessment  Last BM:  02/01/21  Height:  Ht Readings from Last 1 Encounters:  02/02/21 5\' 10"  (1.778 m)   Weight:  Wt Readings from Last 1 Encounters:  02/02/21 91.2 kg   BMI:  Body mass index is 28.84 kg/m.  Estimated Nutritional Needs:  Kcal:  1900-2100 Protein:  115-130 grams Fluid:  >1.9 L  Derrel Nip, RD, LDN (she/her/hers) Clinical Inpatient Dietitian RD Pager/After-Hours/Weekend Pager # in [place]

## 2021-02-02 NOTE — Transfer of Care (Signed)
Immediate Anesthesia Transfer of Care Note  Patient: [name]  Procedure(s) Performed: ENDOSCOPIC RETROGRADE CHOLANGIOPANCREATOGRAPHY (ERCP) SPHINCTEROTOMY  Patient Location: PACU  Anesthesia Type:General  Level of Consciousness: sedated, patient cooperative and responds to stimulation  Airway & Oxygen Therapy: Patient Spontanous Breathing and Patient connected to face mask oxygen  Post-op Assessment: Report given to RN and Post -op Vital signs reviewed and stable  Post vital signs: Reviewed and stable  Last Vitals:  Vitals Value Taken Time  BP 126/80 02/02/21 1657  Temp    Pulse 90 02/02/21 1659  Resp 19 02/02/21 1659  SpO2 100 % 02/02/21 1659  Vitals shown include unvalidated device data.  Last Pain:  Vitals:   02/02/21 1347  TempSrc: Oral  PainSc: 0-No pain         Complications: No notable events documented.

## 2021-02-02 NOTE — Anesthesia Preprocedure Evaluation (Addendum)
Anesthesia Evaluation    Reviewed: Allergy & Precautions, Patient's Chart, lab work & pertinent test results, Unable to perform ROS - Chart review only  Airway Mallampati: II  TM Distance: >3 FB Neck ROM: Full    Dental no notable dental hx.    Pulmonary neg pulmonary ROS, former smoker,    Pulmonary exam normal breath sounds clear to auscultation       Cardiovascular hypertension, Pt. on medications and Pt. on home beta blockers pulmonary hypertensionNormal cardiovascular exam+ Valvular Problems/Murmurs  Rhythm:Regular Rate:Normal  Echo 04/2020 1. Left ventricular ejection fraction, by estimation, is 55 to 60%. The left ventricle has normal function. The left ventricle has no regional wall motion abnormalities. Left ventricular diastolic parameters are consistent with Grade I diastolic dysfunction (impaired relaxation).  2. Right ventricular systolic function is mildly reduced. The right ventricular size is mildly enlarged. There is mildly elevated pulmonary artery systolic pressure.  3. The mitral valve is normal in structure. Trivial mitral valve regurgitation. No evidence of mitral stenosis.  4. The TV is not well seen but is mildly thickened. There is severe tricuspid regurgitation. Consider TEE to more fully evalaute. The tricuspid valve is abnormal. Severe tricuspid stenosis.  5. The aortic valve is normal in structure. There is mild calcification of the aortic valve. Aortic valve regurgitation is not visualized. No aortic stenosis is present.  6. The inferior vena cava is normal in size with greater than 50% respiratory variability, suggesting right atrial pressure of 3 mmHg.    Neuro/Psych negative neurological ROS  negative psych ROS   GI/Hepatic negative GI ROS, Neg liver ROS,   Endo/Other  negative endocrine ROS  Renal/GU negative Renal ROS     Musculoskeletal  (+) Arthritis ,   Abdominal   Peds   Hematology negative hematology ROS (+)   Anesthesia Other Findings Breast cancer  Reproductive/Obstetrics negative OB ROS                            Anesthesia Physical  Anesthesia Plan  ASA: 4  Anesthesia Plan: General   Post-op Pain Management: Minimal or no pain anticipated   Induction: Intravenous  PONV Risk Score and Plan: 3 and Treatment may vary due to age or medical condition, Propofol infusion, Ondansetron and Dexamethasone  Airway Management Planned: Oral ETT  Additional Equipment: None  Intra-op Plan:   Post-operative Plan: Extubation in OR  Informed Consent:   Plan Discussed with:   Anesthesia Plan Comments:        Anesthesia Quick Evaluation

## 2021-02-02 NOTE — Op Note (Signed)
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 Estimated Blood Loss:     Estimated blood loss: none. Procedure:                Pre-Anesthesia Assessment:                           - Prior to the procedure, a History and Physical                            was performed, and patient medications and                            allergies were reviewed. The patient's tolerance of                            previous anesthesia was also reviewed. The risks                            and benefits of the procedure and the sedation                            options and risks were discussed with the patient.                            All questions were answered, and informed consent                            was obtained. Prior Anticoagulants: The patient has                            taken no previous anticoagulant or antiplatelet                            agents. ASA Grade Assessment: III - A patient with  severe systemic disease. After reviewing the risks                            and benefits, the patient was deemed in                            satisfactory condition to undergo the procedure.                           - Sedation was administered by an anesthesia                            professional. General anesthesia was attained.                            After obtaining informed consent, the scope was                            passed under direct vision. Throughout the                            procedure, the patient's blood pressure, pulse, and                            oxygen saturations were monitored continuously. The                            TJF-Q180V ([id number]) Olympus duodenoscope was                            introduced through the mouth, and used to inject                            contrast into and used to inject contrast into the                            dorsal and ventral pancreatic ducts. The ERCP was                            performed with difficulty due to challenging                            cannulation. The patient tolerated the procedure                            well. Scope In: Scope Out: Findings:      The major papilla was normal. A long 0.035 inch Soft Jagwire was placed       into the dorsal pancreatic duct. A 10 mm ventral pancreatic       sphincterotomy was made with a monofilament traction (standard)       sphincterotome using ERBE electrocautery. There was no       post-sphincterotomy bleeding.      The ampulla was large and floppy. Initial cannulation was achieved in a  full bow position and the ampulla was gently engaged. A combination of       bow angles and positioning of the sphincterotome was used and deep       canulation was achieved, but it was into the dorsal PD. A two wire       technique was used, but the CBD was not able to be cannulated. A       Revolution wire and the sphincterotome were used alone and in a two wire       technique to facilitate cannulation. These attempts failed. During the       times that deep ampullary engagement was performed a sphincterotomy was       created with a total length of 1 cm. Bile was clearly visualized exiting       the ampulla, but CBD cannulation was not possible. Impression:               - The major papilla appeared normal.                            - A pancreatic sphincterotomy was performed. Moderate Sedation:      Not Applicable - Patient had care per Anesthesia. Recommendation:           - Return patient to hospital ward for ongoing care.                           - IR consultation for percutaneous drainage. Procedure Code(s):        --- Professional ---                           [id number], Endoscopic retrograde                            cholangiopancreatography (ERCP); with                            sphincterotomy/papillotomy Diagnosis Code(s):        --- Professional ---                           K83.1, Obstruction of bile duct CPT copyright 2019 [employer]. All rights reserved. The codes documented in this report are preliminary and upon coder review may  be revised to meet current compliance requirements. Carol Ada, MD Carol Ada, MD 02/02/2021 4:56:51 PM This report has been signed electronically. Number of Addenda: 0

## 2021-02-02 NOTE — Progress Notes (Signed)
COURTESY NOTE:  [name] tolerated the  ERCP well; hopefully we will start to see improvement in her symptoms soon.   I do not know of anything we have done to precipitate this and don't think it is due to a cancer  metastasis but I also don't have a plausible idea as to the cause. I do not expect  brushings to be positive  Will follow with you  GM

## 2021-02-02 NOTE — Plan of Care (Signed)
  Problem: Clinical Measurements: Goal: Ability to maintain clinical measurements within normal limits will improve Outcome: Progressing Goal: Will remain free from infection Outcome: Progressing   Problem: Activity: Goal: Risk for activity intolerance will decrease Outcome: Progressing   Problem: Nutrition: Goal: Adequate nutrition will be maintained Outcome: Progressing   Problem: Safety: Goal: Ability to remain free from injury will improve Outcome: Progressing   Problem: Skin Integrity: Goal: Risk for impaired skin integrity will decrease Outcome: Progressing   Problem: Clinical Measurements: Goal: Diagnostic test results will improve Outcome: Not Progressing   Problem: Elimination: Goal: Will not experience complications related to bowel motility Outcome: Not Progressing Goal: Will not experience complications related to urinary retention Outcome: Not Progressing   Problem: Clinical Measurements: Goal: Respiratory complications will improve Outcome: Not Applicable Goal: Cardiovascular complication will be avoided Outcome: Not Applicable

## 2021-02-02 NOTE — Anesthesia Procedure Notes (Signed)
Procedure Name: Intubation Date/Time: 02/02/2021 2:51 PM Performed by: Gwyndolyn Saxon, CRNA Pre-anesthesia Checklist: Patient identified, Emergency Drugs available, Suction available and Patient being monitored Patient Re-evaluated:Patient Re-evaluated prior to induction Oxygen Delivery Method: Circle system utilized Preoxygenation: Pre-oxygenation with 100% oxygen Induction Type: IV induction Ventilation: Mask ventilation without difficulty Laryngoscope Size: Miller and 2 Grade View: Grade III Tube type: Oral Tube size: 7.0 mm Number of attempts: 1 Airway Equipment and Method: Patient positioned with wedge pillow and Stylet Placement Confirmation: positive ETCO2 and breath sounds checked- equal and bilateral Secured at: 21 cm Tube secured with: Tape Dental Injury: Teeth and Oropharynx as per pre-operative assessment

## 2021-02-02 NOTE — Progress Notes (Signed)
PROGRESS NOTE    [name]  OIZ:[id number] DOB: 04/07/1965 DOA: 02/01/2021 PCP: [name], MD   Brief Narrative:  55 year old with history of metastatic breast cancer, essential hypertension had been complaining of generalized pruritus therefore follow-up with oncologist and was diagnosed with elevated total bilirubin.  Initially was thought due to her immunotherapy Verzenio and it was stopped.  Then she started having episodes of nausea vomiting, change in her stool color.  Work-up showed elevated total bilirubin.  CT abdomen pelvis showed concerning new right upper quadrant ductal dilation and concerning features of pancreatitis along with possible cholangitis/cholecystitis.  GI team was consulted.   Assessment & Plan:   Principal Problem:   Jaundice Active Problems:   Essential hypertension, benign   Malignant neoplasm of upper-outer quadrant of right breast in female, estrogen receptor positive (HCC)   Jaundice/elevated total bilirubin Acute pancreatitis versus metastatic lesion - CT shows perhaps some evidence of obstruction/inflammation in the right upper quadrant area.  MRCP showed pancreatic head mass concerning for focal acute pancreatitis versus metastatic lesion.  Gastroenterology, Dr. Collene Mares has been consulted.  Right ureteral hydronephrosis - UA- Negative, urine culture ordered.  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 [name], will notify Oncology for their input.   Macrocytic anemia - Hemoglobin at baseline around 9.5    DVT prophylaxis: SCDs Start: 02/01/21 2000 Code Status: Full code Family Communication:       Subjective: Feels ok, still itching all over.   Review of Systems Otherwise negative except as per HPI, including: General: Denies fever, chills, night sweats or unintended weight loss. Resp: Denies  cough, wheezing, shortness of breath. Cardiac: Denies chest pain, palpitations, orthopnea, paroxysmal nocturnal dyspnea. GI: Denies abdominal pain, nausea, vomiting, diarrhea or constipation GU: Denies dysuria, frequency, hesitancy or incontinence MS: Denies muscle aches, joint pain or swelling Neuro: Denies headache, neurologic deficits (focal weakness, numbness, tingling), abnormal gait Psych: Denies anxiety, depression, SI/HI/AVH Skin: Denies new rashes or lesions ID: Denies sick contacts, exotic exposures, travel  Examination:  General exam: Appears calm and comfortable  Respiratory system: Clear to auscultation. Respiratory effort normal. Cardiovascular system: S1 & S2 heard, RRR. No JVD, murmurs, rubs, gallops or clicks. No pedal edema. Gastrointestinal system: Abdomen is nondistended, soft and nontender. No organomegaly or masses felt. Normal bowel sounds heard. Central nervous system: Alert and oriented. No focal neurological deficits. Extremities: Symmetric 5 x 5 power. Skin: +jaundiced skin Psychiatry: Judgement and insight appear normal. Mood & affect appropriate.     Objective: Vitals:   02/01/21 1830 02/01/21 1959 02/01/21 2131 02/02/21 0117  BP: 125/86  133/90 134/83  Pulse: 97  (!) 104 92  Resp: (!) 24  16 18   Temp:   98.4 F (36.9 C) 98.6 F (37 C)  TempSrc:   Oral Oral  SpO2: 98% 98% 100% 97%  Weight:      Height:        Intake/Output Summary (Last 24 hours) at 02/02/2021 0820 Last data filed at 02/02/2021 0359 Gross per 24 hour  Intake 1682.87 ml  Output --  Net 1682.87 ml   Filed Weights   02/01/21 1300  Weight: 91.2 kg     Data Reviewed:   CBC: Recent Labs  Lab 01/26/21 1220 01/28/21 0950 02/01/21 1109 02/01/21 1418 02/02/21 0515  WBC 3.1* 2.7* 3.2* 4.0 3.8*  NEUTROABS 1.9 1.9 1.8 2.1 2.0  HGB  10.4* 9.9* 10.5* 9.8* 8.8*  HCT 32.6* 30.9* 31.2* 29.6* 27.1*  MCV 101.6* 102.7* 98.7 101.4* 102.3*  PLT 268 241 236 221 725   Basic  Metabolic Panel: Recent Labs  Lab 01/26/21 1220 01/28/21 0950 02/01/21 1109 02/01/21 1418 02/02/21 0515  NA 137 137 134* 134* 135  K 3.9 3.8 4.1 4.1 4.0  CL 106 107 104 104 106  CO2 22 20* 20* 21* 22  GLUCOSE 122* 140* 141* 107* 109*  BUN 13 15 14 15 12   CREATININE 0.87 1.20* 1.29* 1.17* 1.09*  CALCIUM 10.4* 10.0 10.2 10.1 9.7   GFR: Estimated Creatinine Clearance: 72.3 mL/min (A) (by C-G formula based on SCr of 1.09 mg/dL (H)). Liver Function Tests: Recent Labs  Lab 01/26/21 1220 01/28/21 0950 02/01/21 1109 02/01/21 1418 02/02/21 0515  AST 26 26 33 31 32  ALT 36 30 31 30 29   ALKPHOS 315* 349* 386* 324* 323*  BILITOT 7.3* 8.6* 11.2* 10.5* 9.8*  PROT 8.1 7.7 7.7 7.5 6.7  ALBUMIN 3.4* 3.2* 3.0* 3.1* 2.8*   Recent Labs  Lab 02/01/21 1418 02/01/21 1454  LIPASE 122*  --   AMYLASE  --  132*   No results for input(s): AMMONIA in the last 168 hours. Coagulation Profile: Recent Labs  Lab 02/01/21 1418 02/02/21 0515  INR 1.1 1.0   Cardiac Enzymes: No results for input(s): CKTOTAL, CKMB, CKMBINDEX, TROPONINI in the last 168 hours. BNP (last 3 results) No results for input(s): PROBNP in the last 8760 hours. HbA1C: No results for input(s): HGBA1C in the last 72 hours. CBG: No results for input(s): GLUCAP in the last 168 hours. Lipid Profile: No results for input(s): CHOL, HDL, LDLCALC, TRIG, CHOLHDL, LDLDIRECT in the last 72 hours. Thyroid Function Tests: Recent Labs    02/01/21 1109  FREET4 1.36*   Anemia Panel: No results for input(s): VITAMINB12, FOLATE, FERRITIN, TIBC, IRON, RETICCTPCT in the last 72 hours. Sepsis Labs: No results for input(s): PROCALCITON, LATICACIDVEN in the last 168 hours.  Recent Results (from the past 240 hour(s))  Resp Panel by RT-PCR (Flu A&B, Covid) Nasopharyngeal Swab     Status: None   Collection Time: 02/01/21  6:27 PM   Specimen: Nasopharyngeal Swab; Nasopharyngeal(NP) swabs in vial transport medium  Result Value Ref Range  Status   SARS Coronavirus 2 by RT PCR NEGATIVE NEGATIVE Final    Comment: (NOTE) SARS-CoV-2 target nucleic acids are NOT DETECTED.  The SARS-CoV-2 RNA is generally detectable in upper respiratory specimens during the acute phase of infection. The lowest concentration of SARS-CoV-2 viral copies this assay can detect is 138 copies/mL. A negative result does not preclude SARS-Cov-2 infection and should not be used as the sole basis for treatment or other patient management decisions. A negative result may occur with  improper specimen collection/handling, submission of specimen other than nasopharyngeal swab, presence of viral mutation(s) within the areas targeted by this assay, and inadequate number of viral copies(<138 copies/mL). A negative result must be combined with clinical observations, patient history, and epidemiological information. The expected result is Negative.  Fact Sheet for Patients:  EntrepreneurPulse.com.au  Fact Sheet for Healthcare Providers:  IncredibleEmployment.be  This test is no t yet approved or cleared by the [place] FDA and  has been authorized for detection and/or diagnosis of SARS-CoV-2 by FDA under an Emergency Use Authorization (EUA). This EUA will remain  in effect (meaning this test can be used) for the duration of the COVID-19 declaration under Section 564(b)(1) of the Act,  21 U.S.C.section 360bbb-3(b)(1), unless the authorization is terminated  or revoked sooner.       Influenza A by PCR NEGATIVE NEGATIVE Final   Influenza B by PCR NEGATIVE NEGATIVE Final    Comment: (NOTE) The Xpert Xpress SARS-CoV-2/FLU/RSV plus assay is intended as an aid in the diagnosis of influenza from Nasopharyngeal swab specimens and should not be used as a sole basis for treatment. Nasal washings and aspirates are unacceptable for Xpert Xpress SARS-CoV-2/FLU/RSV testing.  Fact Sheet for  Patients: EntrepreneurPulse.com.au  Fact Sheet for Healthcare Providers: IncredibleEmployment.be  This test is not yet approved or cleared by the [place] FDA and has been authorized for detection and/or diagnosis of SARS-CoV-2 by FDA under an Emergency Use Authorization (EUA). This EUA will remain in effect (meaning this test can be used) for the duration of the COVID-19 declaration under Section 564(b)(1) of the Act, 21 U.S.C. section 360bbb-3(b)(1), unless the authorization is terminated or revoked.  Performed at [facility], [place] [address], [place], [place] [address]          Radiology Studies: CT Abdomen Pelvis W Contrast  Result Date: 02/01/2021 CLINICAL DATA:  Itching in jaundice for 7 days. Shortness of breath. Dark colored urine. Nausea. Radiation therapy yesterday. History of breast cancer with osseous metastatic disease. EXAM: CT ABDOMEN AND PELVIS WITH CONTRAST TECHNIQUE: Multidetector CT imaging of the abdomen and pelvis was performed using the standard protocol following bolus administration of intravenous contrast. CONTRAST:  77mL OMNIPAQUE IOHEXOL 350 MG/ML SOLN COMPARISON:  12/29/2020 FINDINGS: Lower chest: Right mastectomy. Stable scarring in the right lower lobe. Hepatobiliary: New substantial intrahepatic biliary dilatation observed along with gallbladder wall thickening and high density material in the gallbladder which could be from sludge, proteinaceous fluid, or blood products. There is narrowing and substantial wall enhancement in the right and left hepatic ducts as they converge, raising the possibility of cholangitis. Ill definition of the common bile duct probably from wall thickening. No well-defined stones. Pancreas: Stranding around the pancreatic head along with new mildly dilated dorsal pancreatic duct with truncation of caliber in the pancreatic head region, appearance raises some suspicion for  focal pancreatitis. I do not see a specific well-defined mass in the pancreatic head. Spleen: Unremarkable Adrenals/Urinary Tract: New abnormal hydronephrosis and proximal hydroureter extending down to a 8.5 cm dilated segment of the right distal ureter which contains high internal density; this is new compared to the 12/29/2020 exam and accordingly raises suspicion for a blood clot rather than tumor. Mildly delayed nephrogram on the right. No well-defined stone. Adrenal glands unremarkable. Urinary bladder unremarkable. Stomach/Bowel: No dilated bowel observed.  Normal appendix. Vascular/Lymphatic: Left periaortic lymph node 1.3 cm in short axis on image 42 series 2, previously 1.9 cm. Other retroperitoneal lymph nodes are likewise improved compared to previous. Reproductive: Uterus absent.  Adnexa unremarkable. Other: Stable mild retroperitoneal stranding. Musculoskeletal: Stable widespread scattered sclerotic lesions indicative of prior osseous metastatic disease. Small umbilical hernia contains adipose tissue. IMPRESSION: 1. Since 12/29/2020, there is new substantial intrahepatic biliary dilatation along with dorsal pancreatic duct dilatation. There seems to be abnormal enhancement and wall thickening in the common hepatic duct and common bile duct, along with gallbladder wall thickening and gallbladder sludge. In addition there is some stranding around the pancreatic head with truncation of the dilated dorsal pancreatic duct. Possibilities may include cholangitis and/or focal pancreatitis involving the pancreatic head. Acute cholecystitis not excluded. A discrete pancreatic head mass is not well seen. 2. In addition, there is  new substantial right hydronephrosis and delayed nephrogram on the right extending down to a tapering plug of high density material in the proximal ureter, presumably representing a blood clot in the ureter given that the kidney and ureter had a normal appearance 1 month ago. Tumor in the  ureter seems unlikely in this time frame. Severe ureteritis might be a differential diagnostic consideration. 3. Mildly reduced retroperitoneal adenopathy, although the retroperitoneal stranding continues. 4. Stable widespread scattered sclerotic lesions indicative of prior metastatic disease. Electronically Signed   By: Van Clines M.D.   On: 02/01/2021 16:31   MR ABDOMEN MRCP WO CONTRAST  Result Date: 02/01/2021 CLINICAL DATA:  Jaundice. Biliary ductal dilatation and right hydronephrosis on recent CT. Metastatic breast carcinoma. EXAM: MRI ABDOMEN WITHOUT CONTRAST  (INCLUDING MRCP) TECHNIQUE: Multiplanar multisequence MR imaging of the abdomen was performed. Heavily T2-weighted images of the biliary and pancreatic ducts were obtained, and three-dimensional MRCP images were rendered by post processing. COMPARISON:  CT on 02/01/2021 FINDINGS: Lower chest: No acute findings. Hepatobiliary: No hepatic masses visualized on this unenhanced exam. Diffuse dilatation of the intrahepatic bile ducts is seen due to stricture of the common hepatic duct in the liver hilum. No evidence of cholecystitis. Pancreas: Pancreatic ductal dilatation is seen with obstruction in the pancreatic head. A solid T2 hypointense mass is seen in the pancreatic head measuring 3.6 x 3.6 cm on image 22/4, which is new since earlier CT on 12/29/2020. Adjacent soft tissue edema is seen within the porta hepatis and throughout the peripancreatic region. This rapid appearance in the past month is suggestive of focal pancreatitis, with metastatic disease or primary pancreatic carcinoma considered less likely. Spleen:  Within normal limits in size. Adrenals/Urinary tract: No evidence of renal mass or abscess. Moderate right hydronephrosis is also new since 12/29/2020, with soft tissue thickening seen involving the proximal right ureter, with adjacent retroperitoneal edema. These findings may be secondary to pancreatitis, with metastatic disease  considered less likely. Stomach/Bowel: No evidence of obstruction or wall thickening. Vascular/Lymphatic: Mild retroperitoneal lymphadenopathy is seen with largest lymph node measuring 1.5 cm on image 27/4. This shows mild decrease from 2.4 cm on earlier CT of 12/29/2020. Other:  None. 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 IMPRESSION: 3.6 cm masslike area in the pancreatic head with adjacent peripancreatic edema, which also causes diffuse biliary and pancreatic ductal dilatation. Rapid appearance since prior CT approximately 1 month ago is suggestive of focal pancreatitis, although metastatic disease cannot be excluded. Moderate right hydronephrosis with soft tissue prominence involving the proximal right ureter. These findings may also be secondary to acute pancreatitis, although metastatic disease cannot be excluded. Mild decrease in retroperitoneal lymphadenopathy since earlier CT on 10/18/202222. Diffuse bone metastases. Electronically Signed   By: Marlaine Hind M.D.   On: 02/01/2021 21:54   MR 3D Recon At Scanner  Result Date: 02/01/2021 CLINICAL DATA:  Jaundice. Biliary ductal dilatation and right hydronephrosis on recent CT. Metastatic breast carcinoma. EXAM: MRI ABDOMEN WITHOUT CONTRAST  (INCLUDING MRCP) TECHNIQUE: Multiplanar multisequence MR imaging of the abdomen was performed. Heavily T2-weighted images of the biliary and pancreatic ducts were obtained, and three-dimensional MRCP images were rendered by post processing. COMPARISON:  CT on 02/01/2021 FINDINGS: Lower chest: No acute findings. Hepatobiliary: No hepatic masses visualized on this unenhanced exam. Diffuse dilatation of the intrahepatic bile ducts is seen due to stricture of the common hepatic duct in the liver hilum. No evidence of cholecystitis. Pancreas: Pancreatic ductal dilatation is seen with obstruction  in the pancreatic head. A solid T2 hypointense mass is seen in the pancreatic head  measuring 3.6 x 3.6 cm on image 22/4, which is new since earlier CT on 12/29/2020. Adjacent soft tissue edema is seen within the porta hepatis and throughout the peripancreatic region. This rapid appearance in the past month is suggestive of focal pancreatitis, with metastatic disease or primary pancreatic carcinoma considered less likely. Spleen:  Within normal limits in size. Adrenals/Urinary tract: No evidence of renal mass or abscess. Moderate right hydronephrosis is also new since 12/29/2020, with soft tissue thickening seen involving the proximal right ureter, with adjacent retroperitoneal edema. These findings may be secondary to pancreatitis, with metastatic disease considered less likely. Stomach/Bowel: No evidence of obstruction or wall thickening. Vascular/Lymphatic: Mild retroperitoneal lymphadenopathy is seen with largest lymph node measuring 1.5 cm on image 27/4. This shows mild decrease from 2.4 cm on earlier CT of 12/29/2020. Other:  None. 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 IMPRESSION: 3.6 cm masslike area in the pancreatic head with adjacent peripancreatic edema, which also causes diffuse biliary and pancreatic ductal dilatation. Rapid appearance since prior CT approximately 1 month ago is suggestive of focal pancreatitis, although metastatic disease cannot be excluded. Moderate right hydronephrosis with soft tissue prominence involving the proximal right ureter. These findings may also be secondary to acute pancreatitis, although metastatic disease cannot be excluded. Mild decrease in retroperitoneal lymphadenopathy since earlier CT on 10/18/202222. Diffuse bone metastases. Electronically Signed   By: Marlaine Hind M.D.   On: 02/01/2021 21:54        Scheduled Meds:  cholecalciferol  5,000 Units Oral q morning   influenza vac split quadrivalent PF  0.5 mL Intramuscular Tomorrow-1000   propranolol  10 mg Oral BID   spironolactone  25 mg  Oral BID   vitamin B-12  5,000 mcg Oral q morning   Continuous Infusions:  lactated ringers 125 mL/hr at 02/02/21 0635     LOS: 0 days   Time spent= 35 mins    Daman Steffenhagen Arsenio Loader, MD [facility]  If 7PM-7AM, please contact night-coverage  02/02/2021, 8:20 AM

## 2021-02-02 NOTE — Anesthesia Postprocedure Evaluation (Signed)
Anesthesia Post Note  Patient: [name]  Procedure(s) Performed: ENDOSCOPIC RETROGRADE CHOLANGIOPANCREATOGRAPHY (ERCP) SPHINCTEROTOMY     Patient location during evaluation: Endoscopy Anesthesia Type: General Level of consciousness: awake and sedated Pain management: pain level controlled Vital Signs Assessment: post-procedure vital signs reviewed and stable Respiratory status: spontaneous breathing Cardiovascular status: stable Postop Assessment: no apparent nausea or vomiting Anesthetic complications: no   No notable events documented.  Last Vitals:  Vitals:   02/02/21 1700 02/02/21 1710  BP: 124/75 126/80  Pulse: 87 93  Resp: 14 19  Temp:    SpO2: 100% 94%    Last Pain:  Vitals:   02/02/21 1710  TempSrc:   PainSc: 0-No pain                 Huston Foley

## 2021-02-02 NOTE — Consult Note (Signed)
Reason for Consult: Biliary obstruction Referring Physician: [facility]  Ennis Forts HPI: This is a 55 year old female with a PMH of metastatic breast cancer admitted for a biliary obstruction.  The patient was routinely following up with Oncology when she was noted to have an increased TB.  The thought was Verzenio was the source, but stopping the medication did not resolve the issue.  In fact, her TB increased up to 11 from 7.3.  Pruritis ensued and she was feeling uncomfortable.  The patient was admitted for a billiary obstruction as the CT scan showed marked dilation of the intrahepatics.  There is no known history of current findings for any metastatic spread to the liver.  Past Medical History:  Diagnosis Date   Allergy    Arthritis 2018   hands, left knee   Breast cancer ([place]) 03-27-2020   right breast IMC   Eczema    Family history of breast cancer    Family history of lung cancer    Family history of prostate cancer    Hypertension     Past Surgical History:  Procedure Laterality Date   ABDOMINAL HYSTERECTOMY     CESAREAN SECTION     KNEE ARTHROSCOPY W/ ACL RECONSTRUCTION Left    KNEE SURGERY     MODIFIED MASTECTOMY Right 12/07/2020   Procedure: RIGHT MODIFIED RADICAL MASTECTOMY;  Surgeon: Rolm Bookbinder, MD;  Location: [facility];  Service: General;  Laterality: Right;   PORTACATH PLACEMENT Left 04/23/2020   Procedure: LEFT SIDE PORT PLACEMENT WITH ULTRASOUND GUIDANCE;  Surgeon: Rolm Bookbinder, MD;  Location: [facility];  Service: General;  Laterality: Left;  230PM START TIME PLEASE ROOM 2 THANKS   TUBAL LIGATION      Family History  Problem Relation Age of Onset   Breast cancer Mother 12   Arthritis Mother    Diabetes Father    Kidney disease Father    Hypertension Sister    Heart disease Maternal Grandmother    Breast cancer Paternal Grandmother        dx >50   Heart disease Maternal Aunt    Breast cancer Maternal  Aunt 77   Other Maternal Aunt        brain tumor (not cancerous)   Lung cancer Maternal Aunt    Prostate cancer Cousin 56       localized    Social History:  reports that she quit smoking about 3 years ago. Her smoking use included cigarettes. She has a 9.00 pack-year smoking history. She has never used smokeless tobacco. She reports current alcohol use of about 3.0 standard drinks per week. She reports that she does not use drugs.  Allergies: No Known Allergies  Medications: Scheduled:  [MAR Hold] Chlorhexidine Gluconate Cloth  6 each Topical Daily   [MAR Hold] cholecalciferol  5,000 Units Oral q morning   feeding supplement  1 Container Oral TID BM   multivitamin with minerals  1 tablet Oral Daily   [MAR Hold] propranolol  10 mg Oral BID   [MAR Hold] spironolactone  25 mg Oral BID   [MAR Hold] vitamin B-12  5,000 mcg Oral q morning   Continuous:  sodium chloride     lactated ringers 75 mL/hr at 02/02/21 [id number]    Results for orders placed or performed during the hospital encounter of 02/01/21 (from the past 24 hour(s))  Amylase     Status: Abnormal   Collection Time: 02/01/21  2:54 PM  Result  Value Ref Range   Amylase 132 (H) 28 - 100 U/L  Urinalysis, Routine w reflex microscopic Urine, Clean Catch     Status: Abnormal   Collection Time: 02/01/21  4:58 PM  Result Value Ref Range   Color, Urine AMBER (A) YELLOW   APPearance CLEAR CLEAR   Specific Gravity, Urine 1.033 (H) 1.005 - 1.030   pH 5.0 5.0 - 8.0   Glucose, UA NEGATIVE NEGATIVE mg/dL   Hgb urine dipstick NEGATIVE NEGATIVE   Bilirubin Urine NEGATIVE NEGATIVE   Ketones, ur NEGATIVE NEGATIVE mg/dL   Protein, ur NEGATIVE NEGATIVE mg/dL   Nitrite NEGATIVE NEGATIVE   Leukocytes,Ua NEGATIVE NEGATIVE  Resp Panel by RT-PCR (Flu A&B, Covid) Nasopharyngeal Swab     Status: None   Collection Time: 02/01/21  6:27 PM   Specimen: Nasopharyngeal Swab; Nasopharyngeal(NP) swabs in vial transport medium  Result Value Ref Range    SARS Coronavirus 2 by RT PCR NEGATIVE NEGATIVE   Influenza A by PCR NEGATIVE NEGATIVE   Influenza B by PCR NEGATIVE NEGATIVE  HIV Antibody (routine testing w rflx)     Status: None   Collection Time: 02/02/21  5:15 AM  Result Value Ref Range   HIV Screen 4th Generation wRfx Non Reactive Non Reactive  Hepatic function panel     Status: Abnormal   Collection Time: 02/02/21  5:15 AM  Result Value Ref Range   Total Protein 6.7 6.5 - 8.1 g/dL   Albumin 2.8 (L) 3.5 - 5.0 g/dL   AST 32 15 - 41 U/L   ALT 29 0 - 44 U/L   Alkaline Phosphatase 323 (H) 38 - 126 U/L   Total Bilirubin 9.8 (H) 0.3 - 1.2 mg/dL   Bilirubin, Direct 6.2 (H) 0.0 - 0.2 mg/dL   Indirect Bilirubin 3.6 (H) 0.3 - 0.9 mg/dL  Basic metabolic panel     Status: Abnormal   Collection Time: 02/02/21  5:15 AM  Result Value Ref Range   Sodium 135 135 - 145 mmol/L   Potassium 4.0 3.5 - 5.1 mmol/L   Chloride 106 98 - 111 mmol/L   CO2 22 22 - 32 mmol/L   Glucose, Bld 109 (H) 70 - 99 mg/dL   BUN 12 6 - 20 mg/dL   Creatinine, Ser 1.09 (H) 0.44 - 1.00 mg/dL   Calcium 9.7 8.9 - 10.3 mg/dL   GFR, Estimated >60 >60 mL/min   Anion gap 7 5 - 15  CBC WITH DIFFERENTIAL     Status: Abnormal   Collection Time: 02/02/21  5:15 AM  Result Value Ref Range   WBC 3.8 (L) 4.0 - 10.5 K/uL   RBC 2.65 (L) 3.87 - 5.11 MIL/uL   Hemoglobin 8.8 (L) 12.0 - 15.0 g/dL   HCT 27.1 (L) 36.0 - 46.0 %   MCV 102.3 (H) 80.0 - 100.0 fL   MCH 33.2 26.0 - 34.0 pg   MCHC 32.5 30.0 - 36.0 g/dL   RDW 16.1 (H) 11.5 - 15.5 %   Platelets 245 150 - 400 K/uL   nRBC 0.5 (H) 0.0 - 0.2 %   Neutrophils Relative % 51 %   Neutro Abs 2.0 1.7 - 7.7 K/uL   Lymphocytes Relative 25 %   Lymphs Abs 1.0 0.7 - 4.0 K/uL   Monocytes Relative 21 %   Monocytes Absolute 0.8 0.1 - 1.0 K/uL   Eosinophils Relative 1 %   Eosinophils Absolute 0.0 0.0 - 0.5 K/uL   Basophils Relative 1 %   Basophils  Absolute 0.0 0.0 - 0.1 K/uL   Immature Granulocytes 1 %   Abs Immature Granulocytes 0.02  0.00 - 0.07 K/uL   Reactive, Benign Lymphocytes PRESENT    Polychromasia PRESENT   Protime-INR     Status: None   Collection Time: 02/02/21  5:15 AM  Result Value Ref Range   Prothrombin Time 13.3 11.4 - 15.2 seconds   INR 1.0 0.8 - 1.2     CT Abdomen Pelvis W Contrast  Result Date: 02/01/2021 CLINICAL DATA:  Itching in jaundice for 7 days. Shortness of breath. Dark colored urine. Nausea. Radiation therapy yesterday. History of breast cancer with osseous metastatic disease. EXAM: CT ABDOMEN AND PELVIS WITH CONTRAST TECHNIQUE: Multidetector CT imaging of the abdomen and pelvis was performed using the standard protocol following bolus administration of intravenous contrast. CONTRAST:  31mL OMNIPAQUE IOHEXOL 350 MG/ML SOLN COMPARISON:  12/29/2020 FINDINGS: Lower chest: Right mastectomy. Stable scarring in the right lower lobe. Hepatobiliary: New substantial intrahepatic biliary dilatation observed along with gallbladder wall thickening and high density material in the gallbladder which could be from sludge, proteinaceous fluid, or blood products. There is narrowing and substantial wall enhancement in the right and left hepatic ducts as they converge, raising the possibility of cholangitis. Ill definition of the common bile duct probably from wall thickening. No well-defined stones. Pancreas: Stranding around the pancreatic head along with new mildly dilated dorsal pancreatic duct with truncation of caliber in the pancreatic head region, appearance raises some suspicion for focal pancreatitis. I do not see a specific well-defined mass in the pancreatic head. Spleen: Unremarkable Adrenals/Urinary Tract: New abnormal hydronephrosis and proximal hydroureter extending down to a 8.5 cm dilated segment of the right distal ureter which contains high internal density; this is new compared to the 12/29/2020 exam and accordingly raises suspicion for a blood clot rather than tumor. Mildly delayed nephrogram on the  right. No well-defined stone. Adrenal glands unremarkable. Urinary bladder unremarkable. Stomach/Bowel: No dilated bowel observed.  Normal appendix. Vascular/Lymphatic: Left periaortic lymph node 1.3 cm in short axis on image 42 series 2, previously 1.9 cm. Other retroperitoneal lymph nodes are likewise improved compared to previous. Reproductive: Uterus absent.  Adnexa unremarkable. Other: Stable mild retroperitoneal stranding. Musculoskeletal: Stable widespread scattered sclerotic lesions indicative of prior osseous metastatic disease. Small umbilical hernia contains adipose tissue. IMPRESSION: 1. Since 12/29/2020, there is new substantial intrahepatic biliary dilatation along with dorsal pancreatic duct dilatation. There seems to be abnormal enhancement and wall thickening in the common hepatic duct and common bile duct, along with gallbladder wall thickening and gallbladder sludge. In addition there is some stranding around the pancreatic head with truncation of the dilated dorsal pancreatic duct. Possibilities may include cholangitis and/or focal pancreatitis involving the pancreatic head. Acute cholecystitis not excluded. A discrete pancreatic head mass is not well seen. 2. In addition, there is new substantial right hydronephrosis and delayed nephrogram on the right extending down to a tapering plug of high density material in the proximal ureter, presumably representing a blood clot in the ureter given that the kidney and ureter had a normal appearance 1 month ago. Tumor in the ureter seems unlikely in this time frame. Severe ureteritis might be a differential diagnostic consideration. 3. Mildly reduced retroperitoneal adenopathy, although the retroperitoneal stranding continues. 4. Stable widespread scattered sclerotic lesions indicative of prior metastatic disease. Electronically Signed   By: Van Clines M.D.   On: 02/01/2021 16:31   MR ABDOMEN MRCP WO CONTRAST  Result Date: 02/01/2021 CLINICAL  DATA:  Jaundice. Biliary ductal dilatation and right hydronephrosis on recent CT. Metastatic breast carcinoma. EXAM: MRI ABDOMEN WITHOUT CONTRAST  (INCLUDING MRCP) TECHNIQUE: Multiplanar multisequence MR imaging of the abdomen was performed. Heavily T2-weighted images of the biliary and pancreatic ducts were obtained, and three-dimensional MRCP images were rendered by post processing. COMPARISON:  CT on 02/01/2021 FINDINGS: Lower chest: No acute findings. Hepatobiliary: No hepatic masses visualized on this unenhanced exam. Diffuse dilatation of the intrahepatic bile ducts is seen due to stricture of the common hepatic duct in the liver hilum. No evidence of cholecystitis. Pancreas: Pancreatic ductal dilatation is seen with obstruction in the pancreatic head. A solid T2 hypointense mass is seen in the pancreatic head measuring 3.6 x 3.6 cm on image 22/4, which is new since earlier CT on 12/29/2020. Adjacent soft tissue edema is seen within the porta hepatis and throughout the peripancreatic region. This rapid appearance in the past month is suggestive of focal pancreatitis, with metastatic disease or primary pancreatic carcinoma considered less likely. Spleen:  Within normal limits in size. Adrenals/Urinary tract: No evidence of renal mass or abscess. Moderate right hydronephrosis is also new since 12/29/2020, with soft tissue thickening seen involving the proximal right ureter, with adjacent retroperitoneal edema. These findings may be secondary to pancreatitis, with metastatic disease considered less likely. Stomach/Bowel: No evidence of obstruction or wall thickening. Vascular/Lymphatic: Mild retroperitoneal lymphadenopathy is seen with largest lymph node measuring 1.5 cm on image 27/4. This shows mild decrease from 2.4 cm on earlier CT of 12/29/2020. Other:  None. 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 IMPRESSION: 3.6 cm masslike area in the pancreatic head with  adjacent peripancreatic edema, which also causes diffuse biliary and pancreatic ductal dilatation. Rapid appearance since prior CT approximately 1 month ago is suggestive of focal pancreatitis, although metastatic disease cannot be excluded. Moderate right hydronephrosis with soft tissue prominence involving the proximal right ureter. These findings may also be secondary to acute pancreatitis, although metastatic disease cannot be excluded. Mild decrease in retroperitoneal lymphadenopathy since earlier CT on 10/18/202222. Diffuse bone metastases. Electronically Signed   By: Marlaine Hind M.D.   On: 02/01/2021 21:54   MR 3D Recon At Scanner  Result Date: 02/01/2021 CLINICAL DATA:  Jaundice. Biliary ductal dilatation and right hydronephrosis on recent CT. Metastatic breast carcinoma. EXAM: MRI ABDOMEN WITHOUT CONTRAST  (INCLUDING MRCP) TECHNIQUE: Multiplanar multisequence MR imaging of the abdomen was performed. Heavily T2-weighted images of the biliary and pancreatic ducts were obtained, and three-dimensional MRCP images were rendered by post processing. COMPARISON:  CT on 02/01/2021 FINDINGS: Lower chest: No acute findings. Hepatobiliary: No hepatic masses visualized on this unenhanced exam. Diffuse dilatation of the intrahepatic bile ducts is seen due to stricture of the common hepatic duct in the liver hilum. No evidence of cholecystitis. Pancreas: Pancreatic ductal dilatation is seen with obstruction in the pancreatic head. A solid T2 hypointense mass is seen in the pancreatic head measuring 3.6 x 3.6 cm on image 22/4, which is new since earlier CT on 12/29/2020. Adjacent soft tissue edema is seen within the porta hepatis and throughout the peripancreatic region. This rapid appearance in the past month is suggestive of focal pancreatitis, with metastatic disease or primary pancreatic carcinoma considered less likely. Spleen:  Within normal limits in size. Adrenals/Urinary tract: No evidence of renal mass or  abscess. Moderate right hydronephrosis is also new since 12/29/2020, with soft tissue thickening seen involving the proximal right ureter, with adjacent retroperitoneal edema. These findings may be secondary to  pancreatitis, with metastatic disease considered less likely. Stomach/Bowel: No evidence of obstruction or wall thickening. Vascular/Lymphatic: Mild retroperitoneal lymphadenopathy is seen with largest lymph node measuring 1.5 cm on image 27/4. This shows mild decrease from 2.4 cm on earlier CT of 12/29/2020. Other:  None. 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 IMPRESSION: 3.6 cm masslike area in the pancreatic head with adjacent peripancreatic edema, which also causes diffuse biliary and pancreatic ductal dilatation. Rapid appearance since prior CT approximately 1 month ago is suggestive of focal pancreatitis, although metastatic disease cannot be excluded. Moderate right hydronephrosis with soft tissue prominence involving the proximal right ureter. These findings may also be secondary to acute pancreatitis, although metastatic disease cannot be excluded. Mild decrease in retroperitoneal lymphadenopathy since earlier CT on 10/18/202222. Diffuse bone metastases. Electronically Signed   By: Marlaine Hind M.D.   On: 02/01/2021 21:54    ROS:  As stated above in the HPI otherwise negative.  Blood pressure 104/84, pulse 95, temperature 97.7 F (36.5 C), resp. rate 20, height 5\' 10"  (1.778 m), weight 91.2 kg, SpO2 100 %.    PE: Gen: NAD, Alert and Oriented HEENT:  [place]/AT, EOMI, scleral icterus Neck: Supple, no LAD Lungs: CTA Bilaterally CV: RRR without M/G/R ABD: Soft, NTND, +BS Ext: No C/C/E  Assessment/Plan: 1) Biliary obstruction - ERCP with stent placement and brushings.  Kateland Leisinger D 02/02/2021, 2:37 PM

## 2021-02-03 ENCOUNTER — Inpatient Hospital Stay (HOSPITAL_COMMUNITY): Payer: BC Managed Care – PPO

## 2021-02-03 ENCOUNTER — Ambulatory Visit: Payer: BC Managed Care – PPO

## 2021-02-03 ENCOUNTER — Other Ambulatory Visit: Payer: Self-pay | Admitting: Oncology

## 2021-02-03 ENCOUNTER — Inpatient Hospital Stay: Payer: BC Managed Care – PPO | Admitting: Radiology

## 2021-02-03 DIAGNOSIS — R17 Unspecified jaundice: Secondary | ICD-10-CM | POA: Diagnosis not present

## 2021-02-03 HISTORY — PX: IR BILIARY DRAIN PLACEMENT WITH CHOLANGIOGRAM: IMG6043

## 2021-02-03 LAB — COMPREHENSIVE METABOLIC PANEL
ALT: 31 U/L (ref 0–44)
AST: 36 U/L (ref 15–41)
Albumin: 2.8 g/dL — ABNORMAL LOW (ref 3.5–5.0)
Alkaline Phosphatase: 310 U/L — ABNORMAL HIGH (ref 38–126)
Anion gap: 9 (ref 5–15)
BUN: 14 mg/dL (ref 6–20)
CO2: 23 mmol/L (ref 22–32)
Calcium: 10.4 mg/dL — ABNORMAL HIGH (ref 8.9–10.3)
Chloride: 103 mmol/L (ref 98–111)
Creatinine, Ser: 0.93 mg/dL (ref 0.44–1.00)
GFR, Estimated: >60 mL/min (ref >60)
Glucose, Bld: 148 mg/dL — ABNORMAL HIGH (ref 70–99)
Potassium: 4.1 mmol/L (ref 3.5–5.1)
Sodium: 135 mmol/L (ref 135–145)
Total Bilirubin: 10.2 mg/dL — ABNORMAL HIGH (ref 0.3–1.2)
Total Protein: 7.2 g/dL (ref 6.5–8.1)

## 2021-02-03 LAB — CBC
HCT: 28.8 % — ABNORMAL LOW (ref 36.0–46.0)
Hemoglobin: 9.5 g/dL — ABNORMAL LOW (ref 12.0–15.0)
MCH: 33.7 pg (ref 26.0–34.0)
MCHC: 33 g/dL (ref 30.0–36.0)
MCV: 102.1 fL — ABNORMAL HIGH (ref 80.0–100.0)
Platelets: 217 10*3/uL (ref 150–400)
RBC: 2.82 MIL/uL — ABNORMAL LOW (ref 3.87–5.11)
RDW: 15.6 % — ABNORMAL HIGH (ref 11.5–15.5)
WBC: 4.1 10*3/uL (ref 4.0–10.5)
nRBC: 1 % — ABNORMAL HIGH (ref 0.0–0.2)

## 2021-02-03 LAB — MAGNESIUM: Magnesium: 1.9 mg/dL (ref 1.7–2.4)

## 2021-02-03 MED ORDER — IOHEXOL 300 MG/ML  SOLN
25.0000 mL | Freq: Once | INTRAMUSCULAR | Status: AC | PRN
  Administered 2021-02-03: 25 mL

## 2021-02-03 MED ORDER — MIDAZOLAM HCL 2 MG/2ML IJ SOLN
INTRAMUSCULAR | Status: AC | PRN
  Administered 2021-02-03: 1 mg via INTRAVENOUS

## 2021-02-03 MED ORDER — SODIUM CHLORIDE 0.9 % IV SOLN
6.2500 mg | Freq: Four times a day (QID) | INTRAVENOUS | Status: DC | PRN
  Filled 2021-02-03 (×4): qty 0.25

## 2021-02-03 MED ORDER — SODIUM CHLORIDE 0.9 % IV SOLN
12.5000 mg | Freq: Four times a day (QID) | INTRAVENOUS | Status: DC | PRN
  Administered 2021-02-03: 12.5 mg via INTRAVENOUS
  Filled 2021-02-03: qty 12.5
  Filled 2021-02-03: qty 0.5

## 2021-02-03 MED ORDER — MIDAZOLAM HCL 2 MG/2ML IJ SOLN
INTRAMUSCULAR | Status: AC
  Filled 2021-02-03: qty 2

## 2021-02-03 MED ORDER — ONDANSETRON HCL 4 MG/2ML IJ SOLN
4.0000 mg | Freq: Four times a day (QID) | INTRAMUSCULAR | Status: DC | PRN
  Administered 2021-02-03 – 2021-02-10 (×5): 4 mg via INTRAVENOUS
  Filled 2021-02-03 (×5): qty 2

## 2021-02-03 MED ORDER — FENTANYL CITRATE (PF) 100 MCG/2ML IJ SOLN
INTRAMUSCULAR | Status: AC | PRN
  Administered 2021-02-03: 50 ug via INTRAVENOUS

## 2021-02-03 MED ORDER — FENTANYL CITRATE (PF) 100 MCG/2ML IJ SOLN
INTRAMUSCULAR | Status: AC
  Filled 2021-02-03: qty 2

## 2021-02-03 MED ORDER — MORPHINE SULFATE (PF) 2 MG/ML IV SOLN
1.0000 mg | Freq: Once | INTRAVENOUS | Status: AC
  Administered 2021-02-03: 1 mg via INTRAVENOUS
  Filled 2021-02-03: qty 1

## 2021-02-03 MED ORDER — SODIUM CHLORIDE 0.9% FLUSH
5.0000 mL | Freq: Three times a day (TID) | INTRAVENOUS | Status: DC
  Administered 2021-02-03 – 2021-02-11 (×19): 5 mL

## 2021-02-03 MED ORDER — CEFOXITIN SODIUM 2 G IV SOLR
2.0000 g | Freq: Once | INTRAVENOUS | Status: AC
  Administered 2021-02-03: 2 g via INTRAVENOUS
  Filled 2021-02-03 (×2): qty 2

## 2021-02-03 MED ORDER — SODIUM CHLORIDE 0.9 % IV BOLUS
500.0000 mL | Freq: Once | INTRAVENOUS | Status: AC
  Administered 2021-02-03: 500 mL via INTRAVENOUS

## 2021-02-03 MED ORDER — LIDOCAINE HCL (PF) 1 % IJ SOLN
INTRAMUSCULAR | Status: AC | PRN
  Administered 2021-02-03: 10 mL via INTRADERMAL

## 2021-02-03 MED ORDER — LIDOCAINE HCL 1 % IJ SOLN
INTRAMUSCULAR | Status: AC
  Filled 2021-02-03: qty 20

## 2021-02-03 MED ORDER — TRAMADOL HCL 50 MG PO TABS: 50.0000 mg | ORAL_TABLET | Freq: Once | ORAL | Status: DC

## 2021-02-03 NOTE — Consult Note (Signed)
Chief Complaint: Patient was seen in consultation today for  Chief Complaint  Patient presents with   Shortness of Breath   Jaundice   Pruritis    Referring Physician(s): Carol Ada, MD  Supervising Physician: Mir, Sharen Heck  Patient Status: [facility] - In-pt  History of Present Illness: [name] is a 55 y.o. female with history of metastatic breast cancer was recently experiencing increasing itching and had followed up with her oncologist who did labs and found her bilirubin was increased.  It was thought that her immunotherapy  Verzenio could be causing it and was stopped.  Despite which patient's bilirubin got worse along with being symptomatic was referred to the ER.  Patient states about 4 days ago she did have episodes of nausea vomiting but denies any abdominal pain has been having decreased volume stools which are stringy.  Denies any fever chills  On admission, patient appeared jaundiced, lipase of 122 amylase of 132 total bilirubin of 10.5 with AST. ALT being normal and alkaline phosphatase 324.   CT abdomen pelvis showed intrahepatic and biliary ductal dilatation and dorsal pancreatic ductal dilatation with also enhancement of the common hepatic and common bile duct with gallbladder wall thickening sludge and also features concerning for pancreatitis.    ERCP was unsuccessful at biliary cannulation.  IR consulted for percutaneous drainage.   Past Medical History:  Diagnosis Date   Allergy    Arthritis 2018   hands, left knee   Breast cancer ([place]) 03-27-2020   right breast IMC   Eczema    Family history of breast cancer    Family history of lung cancer    Family history of prostate cancer    Hypertension     Past Surgical History:  Procedure Laterality Date   ABDOMINAL HYSTERECTOMY     CESAREAN SECTION     KNEE ARTHROSCOPY W/ ACL RECONSTRUCTION Left    KNEE SURGERY     MODIFIED MASTECTOMY Right 12/07/2020   Procedure: RIGHT MODIFIED RADICAL MASTECTOMY;   Surgeon: Rolm Bookbinder, MD;  Location: [place];  Service: General;  Laterality: Right;   PORTACATH PLACEMENT Left 04/23/2020   Procedure: LEFT SIDE PORT PLACEMENT WITH ULTRASOUND GUIDANCE;  Surgeon: Rolm Bookbinder, MD;  Location: [place];  Service: General;  Laterality: Left;  230PM START TIME PLEASE ROOM 2 THANKS   TUBAL LIGATION      Allergies: Patient has no known allergies.  Medications: Prior to Admission medications   Medication Sig Start Date End Date Taking? Authorizing Provider  acetaminophen (TYLENOL) 500 MG tablet Take 1,000 mg by mouth every 6 (six) hours as needed for moderate pain.   Yes [provider]  Boric Acid Vaginal 600 MG SUPP Place 600 mg vaginally daily as needed (yeast infections).   Yes [provider]  Cholecalciferol (VITAMIN D) 125 MCG (5000 UT) CAPS Take 5,000 Units by mouth every morning.   Yes [provider]  clobetasol ointment (TEMOVATE) 4.70 % Apply 1 application topically 2 (two) times daily as needed (rash/itching).   Yes [provider]  Cyanocobalamin (B-12) 5000 MCG CAPS Take 5,000 mcg by mouth every morning.   Yes [provider]  loperamide (IMODIUM) 2 MG capsule Take 2 tablets after first diarrheal bowel movement, then one tablet after each following diarrheal movement; maximum 6 tablets/ day Patient taking differently: 2-4 mg See admin instructions. Take 2 tablets  (4 mg) by mouth after first diarrheal bowel movement, then one tablet (2 mg) after  each following diarrheal movement; maximum 6 tablets/ day 10/22/20  Yes Magrinat, Virgie Dad, MD  methocarbamol (ROBAXIN) 500 MG tablet Take 1 tablet (500 mg total) by mouth every 6 (six) hours as needed for muscle spasms. 12/08/20  Yes Rolm Bookbinder, MD  prochlorperazine (COMPAZINE) 10 MG tablet Take 1 tablet (10 mg total) by mouth every 6 (six) hours as needed for nausea or vomiting. 10/22/20  Yes Magrinat, Virgie Dad, MD   propranolol (INDERAL) 10 MG tablet Take 10 mg by mouth 2 (two) times daily. 06/08/20  Yes [provider]  spironolactone (ALDACTONE) 25 MG tablet Take 25 mg by mouth 2 (two) times daily. 08/14/20  Yes [provider]  cholestyramine (QUESTRAN) 4 g packet Take 1 packet (4 g total) by mouth 2 (two) times daily for 5 days. Patient not taking: Reported on 02/01/2021 01/26/21 01/31/21  Barrie Folk, PA-C  buPROPion (WELLBUTRIN XL) 300 MG 24 hr tablet Take 1 tablet (300 mg total) by mouth every morning. 10/04/13 02/17/20  [name], MD  losartan-hydrochlorothiazide [facility]) 100-12.5 MG per tablet Take 1/2 - 1 tab daily 10/04/13 02/17/20  Zanard, Bernadene Bell, MD  telmisartan-hydrochlorothiazide (MICARDIS HCT) 80-25 MG tablet Take 1 tablet by mouth daily.  02/17/20  [provider]     Family History  Problem Relation Age of Onset   Breast cancer Mother 59   Arthritis Mother    Diabetes Father    Kidney disease Father    Hypertension Sister    Heart disease Maternal Grandmother    Breast cancer Paternal Grandmother        dx >50   Heart disease Maternal Aunt    Breast cancer Maternal Aunt 77   Other Maternal Aunt        brain tumor (not cancerous)   Lung cancer Maternal Aunt    Prostate cancer Cousin 51       localized    Social History   Socioeconomic History   Marital status: Divorced    Spouse name: Not on file   Number of children: Not on file   Years of education: Not on file   Highest education level: Not on file  Occupational History   Not on file  Tobacco Use   Smoking status: Former    Packs/day: 0.25    Years: 36.00    Pack years: 9.00    Types: Cigarettes    Quit date: 04/22/2017    Years since quitting: 3.7   Smokeless tobacco: Never   Tobacco comments:    She is trying to quit.    Vaping Use   Vaping Use: Never used  Substance and Sexual Activity   Alcohol use: Yes    Alcohol/week: 3.0 standard drinks    Types: 3 Glasses of  wine per week    Comment: Occasional    Drug use: Never   Sexual activity: Yes    Partners: Male    Birth control/protection: Surgical  Other Topics Concern   Not on file  Social History Narrative   Marital Status: Divorced    Children:  Sons (2)    Pets: None   Living Situation: Lives alone   Occupation: Wellsite geologist (Financial trader)     Education: Programmer, systems    Tobacco Use/Exposure:  She has smoked as much 1 ppd.  She has smoked for > 20 years.  She is trying to quit and is down to about 3-5 cigs per day.     Alcohol Use:  4 days/months  (3 drinks)    Drug Use:  None   Diet:  Regular   Exercise:  Walking/ Zumba/Basketball, Biking   Hobbies: Dancing/ Cooking                Social Determinants of Health   Financial Resource Strain: Not on file  Food Insecurity: No Food Insecurity   Worried About Charity fundraiser in the Last Year: Never true   Ran Out of Food in the Last Year: Never true  Transportation Needs: No Transportation Needs   Lack of Transportation (Medical): No   Lack of Transportation (Non-Medical): No  Physical Activity: Not on file  Stress: Not on file  Social Connections: Not on file     Review of Systems: A 12 point ROS discussed and pertinent positives are indicated in the HPI above.  All other systems are negative.   Vital Signs: BP (!) 160/95 (BP Location: Left Arm)   Pulse 80   Temp 98.4 F (36.9 C) (Oral)   Resp 16   Ht 5\' 10"  (1.778 m)   Wt 201 lb (91.2 kg)   SpO2 100%   BMI 28.84 kg/m   Physical Exam Constitutional:      Appearance: She is well-developed.  HENT:     Head: Normocephalic and atraumatic.     Mouth/Throat:     Mouth: Mucous membranes are moist.     Pharynx: Oropharynx is clear.  Eyes:     Extraocular Movements: Extraocular movements intact.  Cardiovascular:     Rate and Rhythm: Normal rate and regular rhythm.  Pulmonary:     Effort: Pulmonary effort is normal.  Abdominal:     Palpations: Abdomen is  soft.     Tenderness: There is abdominal tenderness.  Musculoskeletal:     Cervical back: Neck supple.  Skin:    General: Skin is warm and dry.     Capillary Refill: Capillary refill takes less than 2 seconds.  Neurological:     General: No focal deficit present.     Mental Status: She is alert.  Psychiatric:        Mood and Affect: Mood normal.        Behavior: Behavior normal.    Imaging: CT Abdomen Pelvis W Contrast  Result Date: 02/01/2021 CLINICAL DATA:  Itching in jaundice for 7 days. Shortness of breath. Dark colored urine. Nausea. Radiation therapy yesterday. History of breast cancer with osseous metastatic disease. EXAM: CT ABDOMEN AND PELVIS WITH CONTRAST TECHNIQUE: Multidetector CT imaging of the abdomen and pelvis was performed using the standard protocol following bolus administration of intravenous contrast. CONTRAST:  27mL OMNIPAQUE IOHEXOL 350 MG/ML SOLN COMPARISON:  12/29/2020 FINDINGS: Lower chest: Right mastectomy. Stable scarring in the right lower lobe. Hepatobiliary: New substantial intrahepatic biliary dilatation observed along with gallbladder wall thickening and high density material in the gallbladder which could be from sludge, proteinaceous fluid, or blood products. There is narrowing and substantial wall enhancement in the right and left hepatic ducts as they converge, raising the possibility of cholangitis. Ill definition of the common bile duct probably from wall thickening. No well-defined stones. Pancreas: Stranding around the pancreatic head along with new mildly dilated dorsal pancreatic duct with truncation of caliber in the pancreatic head region, appearance raises some suspicion for focal pancreatitis. I do not see a specific well-defined mass in the pancreatic head. Spleen: Unremarkable Adrenals/Urinary Tract: New abnormal hydronephrosis and proximal hydroureter extending down to a 8.5 cm dilated segment of the  right distal ureter which contains high internal  density; this is new compared to the 12/29/2020 exam and accordingly raises suspicion for a blood clot rather than tumor. Mildly delayed nephrogram on the right. No well-defined stone. Adrenal glands unremarkable. Urinary bladder unremarkable. Stomach/Bowel: No dilated bowel observed.  Normal appendix. Vascular/Lymphatic: Left periaortic lymph node 1.3 cm in short axis on image 42 series 2, previously 1.9 cm. Other retroperitoneal lymph nodes are likewise improved compared to previous. Reproductive: Uterus absent.  Adnexa unremarkable. Other: Stable mild retroperitoneal stranding. Musculoskeletal: Stable widespread scattered sclerotic lesions indicative of prior osseous metastatic disease. Small umbilical hernia contains adipose tissue. IMPRESSION: 1. Since 12/29/2020, there is new substantial intrahepatic biliary dilatation along with dorsal pancreatic duct dilatation. There seems to be abnormal enhancement and wall thickening in the common hepatic duct and common bile duct, along with gallbladder wall thickening and gallbladder sludge. In addition there is some stranding around the pancreatic head with truncation of the dilated dorsal pancreatic duct. Possibilities may include cholangitis and/or focal pancreatitis involving the pancreatic head. Acute cholecystitis not excluded. A discrete pancreatic head mass is not well seen. 2. In addition, there is new substantial right hydronephrosis and delayed nephrogram on the right extending down to a tapering plug of high density material in the proximal ureter, presumably representing a blood clot in the ureter given that the kidney and ureter had a normal appearance 1 month ago. Tumor in the ureter seems unlikely in this time frame. Severe ureteritis might be a differential diagnostic consideration. 3. Mildly reduced retroperitoneal adenopathy, although the retroperitoneal stranding continues. 4. Stable widespread scattered sclerotic lesions indicative of prior  metastatic disease. Electronically Signed   By: Van Clines M.D.   On: 02/01/2021 16:31   US RENAL  Result Date: 02/02/2021 CLINICAL DATA:  Hydronephrosis EXAM: RENAL / URINARY TRACT ULTRASOUND COMPLETE COMPARISON:  02/01/2021 FINDINGS: Right Kidney: Renal measurements: 6.2 x 11.2 x 5.9 cm = volume: 217 mL. Normal right renal cortical echotexture. There is severe right-sided hydronephrosis unchanged since recent CT and MRI. No evidence of nephrolithiasis. Left Kidney: Renal measurements: 5.7 x 11.3 x 4.7 cm = volume: 159 mL. Normal left renal cortical echotexture. There is mild left-sided hydronephrosis, increased since recent ultrasound. No nephrolithiasis. Bladder: A left ureteral jet is identified. The right ureteral jet is not seen. No filling defects or bladder wall thickening. Other: None. IMPRESSION: 1. Severe right-sided hydronephrosis, not appreciably changed since recent CT. There is no right ureteral jet identified within the bladder, compatible with high-grade obstruction. 2. Mild left-sided hydronephrosis, increased since prior study. 3. No evidence of nephrolithiasis. Electronically Signed   By: Randa Ngo M.D.   On: 02/02/2021 20:41   MR ABDOMEN MRCP WO CONTRAST  Result Date: 02/01/2021 CLINICAL DATA:  Jaundice. Biliary ductal dilatation and right hydronephrosis on recent CT. Metastatic breast carcinoma. EXAM: MRI ABDOMEN WITHOUT CONTRAST  (INCLUDING MRCP) TECHNIQUE: Multiplanar multisequence MR imaging of the abdomen was performed. Heavily T2-weighted images of the biliary and pancreatic ducts were obtained, and three-dimensional MRCP images were rendered by post processing. COMPARISON:  CT on 02/01/2021 FINDINGS: Lower chest: No acute findings. Hepatobiliary: No hepatic masses visualized on this unenhanced exam. Diffuse dilatation of the intrahepatic bile ducts is seen due to stricture of the common hepatic duct in the liver hilum. No evidence of cholecystitis. Pancreas:  Pancreatic ductal dilatation is seen with obstruction in the pancreatic head. A solid T2 hypointense mass is seen in the pancreatic head measuring 3.6 x 3.6 cm on image  22/4, which is new since earlier CT on 12/29/2020. Adjacent soft tissue edema is seen within the porta hepatis and throughout the peripancreatic region. This rapid appearance in the past month is suggestive of focal pancreatitis, with metastatic disease or primary pancreatic carcinoma considered less likely. Spleen:  Within normal limits in size. Adrenals/Urinary tract: No evidence of renal mass or abscess. Moderate right hydronephrosis is also new since 12/29/2020, with soft tissue thickening seen involving the proximal right ureter, with adjacent retroperitoneal edema. These findings may be secondary to pancreatitis, with metastatic disease considered less likely. Stomach/Bowel: No evidence of obstruction or wall thickening. Vascular/Lymphatic: Mild retroperitoneal lymphadenopathy is seen with largest lymph node measuring 1.5 cm on image 27/4. This shows mild decrease from 2.4 cm on earlier CT of 12/29/2020. Other:  None. 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 IMPRESSION: 3.6 cm masslike area in the pancreatic head with adjacent peripancreatic edema, which also causes diffuse biliary and pancreatic ductal dilatation. Rapid appearance since prior CT approximately 1 month ago is suggestive of focal pancreatitis, although metastatic disease cannot be excluded. Moderate right hydronephrosis with soft tissue prominence involving the proximal right ureter. These findings may also be secondary to acute pancreatitis, although metastatic disease cannot be excluded. Mild decrease in retroperitoneal lymphadenopathy since earlier CT on 10/18/202222. Diffuse bone metastases. Electronically Signed   By: Marlaine Hind M.D.   On: 02/01/2021 21:54   MR 3D Recon At Scanner  Result Date: 02/01/2021 CLINICAL DATA:   Jaundice. Biliary ductal dilatation and right hydronephrosis on recent CT. Metastatic breast carcinoma. EXAM: MRI ABDOMEN WITHOUT CONTRAST  (INCLUDING MRCP) TECHNIQUE: Multiplanar multisequence MR imaging of the abdomen was performed. Heavily T2-weighted images of the biliary and pancreatic ducts were obtained, and three-dimensional MRCP images were rendered by post processing. COMPARISON:  CT on 02/01/2021 FINDINGS: Lower chest: No acute findings. Hepatobiliary: No hepatic masses visualized on this unenhanced exam. Diffuse dilatation of the intrahepatic bile ducts is seen due to stricture of the common hepatic duct in the liver hilum. No evidence of cholecystitis. Pancreas: Pancreatic ductal dilatation is seen with obstruction in the pancreatic head. A solid T2 hypointense mass is seen in the pancreatic head measuring 3.6 x 3.6 cm on image 22/4, which is new since earlier CT on 12/29/2020. Adjacent soft tissue edema is seen within the porta hepatis and throughout the peripancreatic region. This rapid appearance in the past month is suggestive of focal pancreatitis, with metastatic disease or primary pancreatic carcinoma considered less likely. Spleen:  Within normal limits in size. Adrenals/Urinary tract: No evidence of renal mass or abscess. Moderate right hydronephrosis is also new since 12/29/2020, with soft tissue thickening seen involving the proximal right ureter, with adjacent retroperitoneal edema. These findings may be secondary to pancreatitis, with metastatic disease considered less likely. Stomach/Bowel: No evidence of obstruction or wall thickening. Vascular/Lymphatic: Mild retroperitoneal lymphadenopathy is seen with largest lymph node measuring 1.5 cm on image 27/4. This shows mild decrease from 2.4 cm on earlier CT of 12/29/2020. Other:  None. 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 IMPRESSION: 3.6 cm masslike area in the pancreatic head with adjacent  peripancreatic edema, which also causes diffuse biliary and pancreatic ductal dilatation. Rapid appearance since prior CT approximately 1 month ago is suggestive of focal pancreatitis, although metastatic disease cannot be excluded. Moderate right hydronephrosis with soft tissue prominence involving the proximal right ureter. These findings may also be secondary to acute pancreatitis, although metastatic disease cannot be excluded. Mild  decrease in retroperitoneal lymphadenopathy since earlier CT on 10/18/202222. Diffuse bone metastases. Electronically Signed   By: Marlaine Hind M.D.   On: 02/01/2021 21:54   DG ERCP BILIARY & PANCREATIC DUCTS  Result Date: 02/02/2021 CLINICAL DATA:  ERCP EXAM: ERCP TECHNIQUE: Multiple spot images obtained with the fluoroscopic device and submitted for interpretation post-procedure. FLUOROSCOPY TIME:  Refer to procedure report. COMPARISON:  None. FINDINGS: A total of 2 fluoroscopic spot images are submitted for review taken during ERCP procedure. Images demonstrate a scope overlying the upper abdomen. There then appears to be wire catheterization or attempted catheterization through the ampulla. No further images submitted. IMPRESSION: Fluoroscopic spot images during ERCP as described. Refer to procedure report for full details. These images were submitted for radiologic interpretation only. Please see the procedural report for the amount of contrast and the fluoroscopy time utilized. Electronically Signed   By: Albin Felling M.D.   On: 02/02/2021 16:50   DG C-Arm 1-60 Min-No Report  Result Date: 02/02/2021 Fluoroscopy was utilized by the requesting physician.  No radiographic interpretation.   US Abdomen Limited RUQ (LIVER/GB)  Result Date: 01/28/2021 CLINICAL DATA:  Elevated bilirubin. EXAM: ULTRASOUND ABDOMEN LIMITED RIGHT UPPER QUADRANT COMPARISON:  CT temporal bones, 12/29/2020. FINDINGS: Gallbladder: Nondistended, sludge-filled gallbladder with gallbladder wall  measuring near the upper limit of normal in thickness, at 0.4 cm. No sonographic Murphy sign noted by sonographer. Common bile duct: Diameter: 0.6 cm Liver: No focal lesion identified. Within normal limits in parenchymal echogenicity. Portal vein is patent on color Doppler imaging with normal direction of blood flow towards the liver. Other: No ascites. IMPRESSION: Biliary sludge without additional sonographic evidence of acute cholecystitis. If concern for biliary dyskinesia, recommend NM HIDA for further evaluation. Electronically Signed   By: Michaelle Birks M.D.   On: 01/28/2021 08:12    Labs:  CBC: Recent Labs    02/01/21 1109 02/01/21 1418 02/02/21 0515 02/03/21 0604  WBC 3.2* 4.0 3.8* 4.1  HGB 10.5* 9.8* 8.8* 9.5*  HCT 31.2* 29.6* 27.1* 28.8*  PLT 236 221 245 217    COAGS: Recent Labs    02/01/21 1418 02/02/21 0515  INR 1.1 1.0    BMP: Recent Labs    02/01/21 1109 02/01/21 1418 02/02/21 0515 02/03/21 0604  NA 134* 134* 135 135  K 4.1 4.1 4.0 4.1  CL 104 104 106 103  CO2 20* 21* 22 23  GLUCOSE 141* 107* 109* 148*  BUN 14 15 12 14   CALCIUM 10.2 10.1 9.7 10.4*  CREATININE 1.29* 1.17* 1.09* 0.93  GFRNONAA 49* 55* >60 >60    LIVER FUNCTION TESTS: Recent Labs    02/01/21 1109 02/01/21 1418 02/02/21 0515 02/03/21 0604  BILITOT 11.2* 10.5* 9.8* 10.2*  AST 33 31 32 36  ALT 31 30 29 31   ALKPHOS 386* 324* 323* 310*  PROT 7.7 7.5 6.7 7.2  ALBUMIN 3.0* 3.1* 2.8* 2.8*     Assessment and Plan:  Biliary Obstruction --ERCP could not cannulate biliary duct --Percutaneous drainage by IR --Reasonable to proceed --Pt is NPO, expresses understanding, not on blood thinners  Risks and benefits discussed with the patient including bleeding, infection, damage to adjacent structures, bowel perforation/fistula connection, and sepsis.  All of the patient's questions were answered, patient is agreeable to proceed. Consent signed and in chart.   Thank you for this  interesting consult.  I greatly enjoyed meeting [name] and look forward to participating in their care.  A copy of this report was  sent to the requesting provider on this date.  Electronically Signed: Pasty Spillers, PA 02/03/2021, 8:54 AM   I spent a total of 40 Minutes in face to face in clinical consultation, greater than 50% of which was counseling/coordinating care for biliary obstruction.

## 2021-02-03 NOTE — Procedures (Signed)
Interventional Radiology Procedure Note  Procedure: Internal/External Biliary Drain  Indication: Biliary obstruction  Findings: Please refer to procedural dictation for full description.  Complications: None  EBL: < 10 mL  Miachel Roux, MD [phone]

## 2021-02-03 NOTE — Consult Note (Signed)
Consultation: Right hydronephrosis Requested by: Dr. Shawna Clamp  History of Present Illness: [name] is a 55 year old female with metastatic breast cancer.  She has triple negative breast cancer and was treated with right mastectomy and is undergoing postmastectomy radiation.  She is also been on Verzenio. The patient was admitted for a billiary obstruction as the CT scan 02/01/2021 showed marked dilation of the intrahepatics.  There is no known history of current findings for any metastatic spread to the liver or kidney.  She also had new onset right hydronephrosis down to some high density material in the right proximal ureter.  Prior CT scan October 2022 showed benign GU tract.  No hydro.  She underwent renal ultrasound 02/02/2021 which revealed continued right moderate hydro.  The calyces were not distended.  There was mild left hydro which was really just mild dilation of the renal pelvis and an infundibulum.  Her creatinine has been anywhere from 0.8-1.2 over the past months and since she was admitted has remained stable at 0.93.  This is improved compared to a few days ago. The bladder was partially distended.  Patient reports she has had dark urine which is likely from the bilirubin but no red urine or blood clots.  She has had no dysuria.  She denies any prior GU history.  She did have right upper quadrant pain but says that is improved after the drain was placed.  She has no flank pain.  Past Medical History:  Diagnosis Date   Allergy    Arthritis 2018   hands, left knee   Breast cancer ([place]) 03-27-2020   right breast IMC   Eczema    Family history of breast cancer    Family history of lung cancer    Family history of prostate cancer    Hypertension    Past Surgical History:  Procedure Laterality Date   ABDOMINAL HYSTERECTOMY     CESAREAN SECTION     KNEE ARTHROSCOPY W/ ACL RECONSTRUCTION Left    KNEE SURGERY     MODIFIED MASTECTOMY Right 12/07/2020   Procedure: RIGHT MODIFIED  RADICAL MASTECTOMY;  Surgeon: Rolm Bookbinder, MD;  Location: [facility];  Service: General;  Laterality: Right;   PORTACATH PLACEMENT Left 04/23/2020   Procedure: LEFT SIDE PORT PLACEMENT WITH ULTRASOUND GUIDANCE;  Surgeon: Rolm Bookbinder, MD;  Location: [facility];  Service: General;  Laterality: Left;  230PM START TIME PLEASE ROOM 2 THANKS   TUBAL LIGATION      Home Medications:  Medications Prior to Admission  Medication Sig Dispense Refill Last Dose   acetaminophen (TYLENOL) 500 MG tablet Take 1,000 mg by mouth every 6 (six) hours as needed for moderate pain.   01/28/2021   Boric Acid Vaginal 600 MG SUPP Place 600 mg vaginally daily as needed (yeast infections).   few months ago   Cholecalciferol (VITAMIN D) 125 MCG (5000 UT) CAPS Take 5,000 Units by mouth every morning.   02/01/2021 at am   clobetasol ointment (TEMOVATE) 8.11 % Apply 1 application topically 2 (two) times daily as needed (rash/itching).      Cyanocobalamin (B-12) 5000 MCG CAPS Take 5,000 mcg by mouth every morning.   02/01/2021 at am   loperamide (IMODIUM) 2 MG capsule Take 2 tablets after first diarrheal bowel movement, then one tablet after each following diarrheal movement; maximum 6 tablets/ day (Patient taking differently: 2-4 mg See admin instructions. Take 2 tablets  (4 mg) by mouth after first diarrheal bowel movement, then one  tablet (2 mg) after each following diarrheal movement; maximum 6 tablets/ day) 60 capsule 6 few months ago   methocarbamol (ROBAXIN) 500 MG tablet Take 1 tablet (500 mg total) by mouth every 6 (six) hours as needed for muscle spasms. 30 tablet 1 few weeks ago   prochlorperazine (COMPAZINE) 10 MG tablet Take 1 tablet (10 mg total) by mouth every 6 (six) hours as needed for nausea or vomiting. 30 tablet 4 01/29/2021   propranolol (INDERAL) 10 MG tablet Take 10 mg by mouth 2 (two) times daily.   02/01/2021 at 0900   spironolactone (ALDACTONE) 25 MG tablet Take  25 mg by mouth 2 (two) times daily.   01/31/2021 at pm   cholestyramine (QUESTRAN) 4 g packet Take 1 packet (4 g total) by mouth 2 (two) times daily for 5 days. (Patient not taking: Reported on 02/01/2021) 10 packet 0 Not Taking   Allergies: No Known Allergies  Family History  Problem Relation Age of Onset   Breast cancer Mother 98   Arthritis Mother    Diabetes Father    Kidney disease Father    Hypertension Sister    Heart disease Maternal Grandmother    Breast cancer Paternal Grandmother        dx >50   Heart disease Maternal Aunt    Breast cancer Maternal Aunt 77   Other Maternal Aunt        brain tumor (not cancerous)   Lung cancer Maternal Aunt    Prostate cancer Cousin 56       localized   Social History:  reports that she quit smoking about 3 years ago. Her smoking use included cigarettes. She has a 9.00 pack-year smoking history. She has never used smokeless tobacco. She reports current alcohol use of about 3.0 standard drinks per week. She reports that she does not use drugs.  ROS: A complete review of systems was performed.  All systems are negative except for pertinent findings as noted. Review of Systems  Gastrointestinal:  Positive for nausea.  All other systems reviewed and are negative.   Physical Exam:  Vital signs in last 24 hours: Temp:  [97.7 F (36.5 C)-98.6 F (37 C)] 98.4 F (36.9 C) (11/23 0447) Pulse Rate:  [80-102] 94 (11/23 1115) Resp:  [14-28] 22 (11/23 1115) BP: (104-179)/(52-107) 161/96 (11/23 1115) SpO2:  [4 %-100 %] 100 % (11/23 1115) Weight:  [91.2 kg] 91.2 kg (11/22 1347) General:  Alert and oriented, No acute distress HEENT: Normocephalic, atraumatic Neck: No JVD or lymphadenopathy Cardiovascular: Regular rate and rhythm Lungs: Regular rate and effort Abdomen: Soft, nontender, nondistended, no abdominal masses, biliary drain in place Back: No CVA tenderness Extremities: No edema Neurologic: Grossly intact  Laboratory Data:   Results for orders placed or performed during the hospital encounter of 02/01/21 (from the past 24 hour(s))  Comprehensive metabolic panel     Status: Abnormal   Collection Time: 02/03/21  6:04 AM  Result Value Ref Range   Sodium 135 135 - 145 mmol/L   Potassium 4.1 3.5 - 5.1 mmol/L   Chloride 103 98 - 111 mmol/L   CO2 23 22 - 32 mmol/L   Glucose, Bld 148 (H) 70 - 99 mg/dL   BUN 14 6 - 20 mg/dL   Creatinine, Ser 0.93 0.44 - 1.00 mg/dL   Calcium 10.4 (H) 8.9 - 10.3 mg/dL   Total Protein 7.2 6.5 - 8.1 g/dL   Albumin 2.8 (L) 3.5 - 5.0 g/dL   AST 36 15 -  41 U/L   ALT 31 0 - 44 U/L   Alkaline Phosphatase 310 (H) 38 - 126 U/L   Total Bilirubin 10.2 (H) 0.3 - 1.2 mg/dL   GFR, Estimated >60 >60 mL/min   Anion gap 9 5 - 15  CBC     Status: Abnormal   Collection Time: 02/03/21  6:04 AM  Result Value Ref Range   WBC 4.1 4.0 - 10.5 K/uL   RBC 2.82 (L) 3.87 - 5.11 MIL/uL   Hemoglobin 9.5 (L) 12.0 - 15.0 g/dL   HCT 28.8 (L) 36.0 - 46.0 %   MCV 102.1 (H) 80.0 - 100.0 fL   MCH 33.7 26.0 - 34.0 pg   MCHC 33.0 30.0 - 36.0 g/dL   RDW 15.6 (H) 11.5 - 15.5 %   Platelets 217 150 - 400 K/uL   nRBC 1.0 (H) 0.0 - 0.2 %  Magnesium     Status: None   Collection Time: 02/03/21  6:04 AM  Result Value Ref Range   Magnesium 1.9 1.7 - 2.4 mg/dL   Recent Results (from the past 240 hour(s))  Resp Panel by RT-PCR (Flu A&B, Covid) Nasopharyngeal Swab     Status: None   Collection Time: 02/01/21  6:27 PM   Specimen: Nasopharyngeal Swab; Nasopharyngeal(NP) swabs in vial transport medium  Result Value Ref Range Status   SARS Coronavirus 2 by RT PCR NEGATIVE NEGATIVE Final    Comment: (NOTE) SARS-CoV-2 target nucleic acids are NOT DETECTED.  The SARS-CoV-2 RNA is generally detectable in upper respiratory specimens during the acute phase of infection. The lowest concentration of SARS-CoV-2 viral copies this assay can detect is 138 copies/mL. A negative result does not preclude SARS-Cov-2 infection and  should not be used as the sole basis for treatment or other patient management decisions. A negative result may occur with  improper specimen collection/handling, submission of specimen other than nasopharyngeal swab, presence of viral mutation(s) within the areas targeted by this assay, and inadequate number of viral copies(<138 copies/mL). A negative result must be combined with clinical observations, patient history, and epidemiological information. The expected result is Negative.  Fact Sheet for Patients:  EntrepreneurPulse.com.au  Fact Sheet for Healthcare Providers:  IncredibleEmployment.be  This test is no t yet approved or cleared by the [place] FDA and  has been authorized for detection and/or diagnosis of SARS-CoV-2 by FDA under an Emergency Use Authorization (EUA). This EUA will remain  in effect (meaning this test can be used) for the duration of the COVID-19 declaration under Section 564(b)(1) of the Act, 21 U.S.C.section 360bbb-3(b)(1), unless the authorization is terminated  or revoked sooner.       Influenza A by PCR NEGATIVE NEGATIVE Final   Influenza B by PCR NEGATIVE NEGATIVE Final    Comment: (NOTE) The Xpert Xpress SARS-CoV-2/FLU/RSV plus assay is intended as an aid in the diagnosis of influenza from Nasopharyngeal swab specimens and should not be used as a sole basis for treatment. Nasal washings and aspirates are unacceptable for Xpert Xpress SARS-CoV-2/FLU/RSV testing.  Fact Sheet for Patients: EntrepreneurPulse.com.au  Fact Sheet for Healthcare Providers: IncredibleEmployment.be  This test is not yet approved or cleared by the [place] FDA and has been authorized for detection and/or diagnosis of SARS-CoV-2 by FDA under an Emergency Use Authorization (EUA). This EUA will remain in effect (meaning this test can be used) for the duration of the COVID-19 declaration  under Section 564(b)(1) of the Act, 21 U.S.C. section 360bbb-3(b)(1), unless the authorization is terminated  or revoked.  Performed at [facility], [place] [address] [place], [place] [address]    Creatinine: Recent Labs    01/28/21 0950 02/01/21 1109 02/01/21 1418 02/02/21 0515 02/03/21 0604  CREATININE 1.20* 1.29* 1.17* 1.09* 0.93    Impression/Assessment/plan:  New onset right hydronephrosis-fortunately patient does not have flank pain.  Her creatinine remains normal at 0.9.  She has no fever or gross hematuria.  No suspicion of UTI. We discussed the nature r/b/a to right ureteral stent vs. Continue monitoring. Will continue to monitor.  She will need follow-up with urology as outpatient.  If urine output decreases or creatinine rises over time repeat renal ultrasound.   Please page GU on-call with any questions, concerns or changes in patient's status over the holiday and weekend.  Discussed with Dr. Dwyane Dee.     Festus Aloe 02/03/2021, 11:21 AM

## 2021-02-03 NOTE — Progress Notes (Signed)
PROGRESS NOTE    JETTE LEWAN  SPQ:[id number] DOB: 10-10-1965 DOA: 02/01/2021 PCP: [name], MD   Brief Narrative: This 55 year old Female with PMH significant of metastatic breast cancer, Essential hypertension presented in the ED with complaints of generalized pruritus. She had follow-up with oncologist and was diagnosed with elevated total bilirubin.  Initially It was thought due to her immunotherapy Verzenio and it was stopped.  Then she started having episodes of nausea,  vomiting, change in her stool color.  Work-up showed elevated total bilirubin. CT abdomen pelvis showed concerning new right upper quadrant ductal dilation and concerning features of pancreatitis along with possible cholangitis/cholecystitis. GI team was consulted.  Patient underwent ERCP which could not cannulate biliary duct.  IR was consulted for percutaneous drainage which is complete.  Assessment & Plan:   Principal Problem:   Jaundice Active Problems:   Essential hypertension, benign   Malignant neoplasm of upper-outer quadrant of right breast in female, estrogen receptor positive ([place])   Acute pancreatitis   Biliary obstruction: Acute pancreatitis versus metastatic lesion: Patient presented with abdominal pain, nausea, vomiting,  jaundice,  change in stool color and pruritus. CT showed  some evidence of obstruction/inflammation in the right upper quadrant area. MRCP showed pancreatic head mass concerning for focal acute pancreatitis versus metastatic lesion.   Gastroenterology consulted.  Patient underwent ERCP which could not cannulate biliary duct. IR was consulted patient underwent percutaneous drainage which is successful. Continue to monitor comprehensive metabolic panel in the morning Continue Zofran and Phenergan as needed for nausea and vomiting  Right ureteral hydronephrosis: UA negative.  Urine culture pending Renal ultrasound shows Severe right-sided hydronephrosis compatible with  high-grade stenosis. Urology is consulted,  awaiting recommendation.   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 [name].  Microcytic anemia: H&H is stable.    DVT prophylaxis: SCDs Code Status: Full code. Family Communication: No family at bed side. Disposition Plan:   Status is: Inpatient  Remains inpatient appropriate because:  ERCP could not cannulate the biliary duct.  Patient is going to have percutaneous biliary drain.   Consultants:  GI IR  Procedures: ERCP ,   Percutaneous biliary drain.  Antimicrobials:   Anti-infectives (From admission, onward)    Start     Dose/Rate Route Frequency Ordered Stop   02/03/21 1130  cefOXitin (MEFOXIN) 2 g in sodium chloride 0.9 % 100 mL IVPB        2 g 200 mL/hr over 30 Minutes Intravenous  Once 02/03/21 1033 02/03/21 1124        Subjective: Patient was seen and examined at bedside.  Overnight events noted.   Patient reports abdominal pain is still there.  Patient had a ERCP which was not successful.   Patient is scheduled to have percutaneous drain to be placed by IR today.  Objective: Vitals:   02/03/21 1111 02/03/21 1115 02/03/21 1135 02/03/21 1203  BP: (!) 178/105 (!) 161/96 (!) 151/92 (!) 146/90  Pulse: 98 94 91 94  Resp: (!) 25 (!) 22 (!) 22 (!) 25  Temp:      TempSrc:      SpO2: 100% 100% 98% 94%  Weight:      Height:        Intake/Output Summary (Last 24 hours) at 02/03/2021 1225 Last data filed at 02/02/2021 2107 Gross per 24 hour  Intake 1466.73 ml  Output 750 ml  Net 716.73 ml   [employer]  02/01/21 1300 02/02/21 1347  Weight: 91.2 kg 91.2 kg    Examination:  General exam: Appears comfortable, not in any acute distress.  Deconditioned Respiratory system: Clear to auscultation bilaterally. Respiratory effort normal. RR 14 Cardiovascular system: S1-S2 heard, regular rate and rhythm, no  murmur. Chest: Chemo-Port noted in the left chest. Gastrointestinal system: Abdomen is soft, tender, mildly distended, BS+ Central nervous system: Alert and oriented x 3. No focal neurological deficits. Extremities: No edema, no cyanosis, no clubbing. Skin: No rashes, lesions or ulcers Psychiatry: Judgement and insight appear normal. Mood & affect appropriate.     Data Reviewed: I have personally reviewed following labs and imaging studies  CBC: Recent Labs  Lab 01/28/21 0950 02/01/21 1109 02/01/21 1418 02/02/21 0515 02/03/21 0604  WBC 2.7* 3.2* 4.0 3.8* 4.1  NEUTROABS 1.9 1.8 2.1 2.0  --   HGB 9.9* 10.5* 9.8* 8.8* 9.5*  HCT 30.9* 31.2* 29.6* 27.1* 28.8*  MCV 102.7* 98.7 101.4* 102.3* 102.1*  PLT 241 236 221 245 371   Basic Metabolic Panel: Recent Labs  Lab 01/28/21 0950 02/01/21 1109 02/01/21 1418 02/02/21 0515 02/03/21 0604  NA 137 134* 134* 135 135  K 3.8 4.1 4.1 4.0 4.1  CL 107 104 104 106 103  CO2 20* 20* 21* 22 23  GLUCOSE 140* 141* 107* 109* 148*  BUN 15 14 15 12 14   CREATININE 1.20* 1.29* 1.17* 1.09* 0.93  CALCIUM 10.0 10.2 10.1 9.7 10.4*  MG  --   --   --   --  1.9   GFR: Estimated Creatinine Clearance: 84.7 mL/min (by C-G formula based on SCr of 0.93 mg/dL). Liver Function Tests: Recent Labs  Lab 01/28/21 0950 02/01/21 1109 02/01/21 1418 02/02/21 0515 02/03/21 0604  AST 26 33 31 32 36  ALT 30 31 30 29 31   ALKPHOS 349* 386* 324* 323* 310*  BILITOT 8.6* 11.2* 10.5* 9.8* 10.2*  PROT 7.7 7.7 7.5 6.7 7.2  ALBUMIN 3.2* 3.0* 3.1* 2.8* 2.8*   Recent Labs  Lab 02/01/21 1418 02/01/21 1454  LIPASE 122*  --   AMYLASE  --  132*   No results for input(s): AMMONIA in the last 168 hours. Coagulation Profile: Recent Labs  Lab 02/01/21 1418 02/02/21 0515  INR 1.1 1.0   Cardiac Enzymes: No results for input(s): CKTOTAL, CKMB, CKMBINDEX, TROPONINI in the last 168 hours. BNP (last 3 results) No results for input(s): PROBNP in the last 8760  hours. HbA1C: No results for input(s): HGBA1C in the last 72 hours. CBG: No results for input(s): GLUCAP in the last 168 hours. Lipid Profile: No results for input(s): CHOL, HDL, LDLCALC, TRIG, CHOLHDL, LDLDIRECT in the last 72 hours. Thyroid Function Tests: Recent Labs    02/01/21 1109  FREET4 1.36*   Anemia Panel: No results for input(s): VITAMINB12, FOLATE, FERRITIN, TIBC, IRON, RETICCTPCT in the last 72 hours. Sepsis Labs: No results for input(s): PROCALCITON, LATICACIDVEN in the last 168 hours.  Recent Results (from the past 240 hour(s))  Resp Panel by RT-PCR (Flu A&B, Covid) Nasopharyngeal Swab     Status: None   Collection Time: 02/01/21  6:27 PM   Specimen: Nasopharyngeal Swab; Nasopharyngeal(NP) swabs in vial transport medium  Result Value Ref Range Status   SARS Coronavirus 2 by RT PCR NEGATIVE NEGATIVE Final    Comment: (NOTE) SARS-CoV-2 target nucleic acids are NOT DETECTED.  The SARS-CoV-2 RNA is generally detectable in upper respiratory specimens during the acute phase of infection. The lowest concentration of SARS-CoV-2 viral copies  this assay can detect is 138 copies/mL. A negative result does not preclude SARS-Cov-2 infection and should not be used as the sole basis for treatment or other patient management decisions. A negative result may occur with  improper specimen collection/handling, submission of specimen other than nasopharyngeal swab, presence of viral mutation(s) within the areas targeted by this assay, and inadequate number of viral copies(<138 copies/mL). A negative result must be combined with clinical observations, patient history, and epidemiological information. The expected result is Negative.  Fact Sheet for Patients:  EntrepreneurPulse.com.au  Fact Sheet for Healthcare Providers:  IncredibleEmployment.be  This test is no t yet approved or cleared by the [place] FDA and  has been authorized for  detection and/or diagnosis of SARS-CoV-2 by FDA under an Emergency Use Authorization (EUA). This EUA will remain  in effect (meaning this test can be used) for the duration of the COVID-19 declaration under Section 564(b)(1) of the Act, 21 U.S.C.section 360bbb-3(b)(1), unless the authorization is terminated  or revoked sooner.       Influenza A by PCR NEGATIVE NEGATIVE Final   Influenza B by PCR NEGATIVE NEGATIVE Final    Comment: (NOTE) The Xpert Xpress SARS-CoV-2/FLU/RSV plus assay is intended as an aid in the diagnosis of influenza from Nasopharyngeal swab specimens and should not be used as a sole basis for treatment. Nasal washings and aspirates are unacceptable for Xpert Xpress SARS-CoV-2/FLU/RSV testing.  Fact Sheet for Patients: EntrepreneurPulse.com.au  Fact Sheet for Healthcare Providers: IncredibleEmployment.be  This test is not yet approved or cleared by the [place] FDA and has been authorized for detection and/or diagnosis of SARS-CoV-2 by FDA under an Emergency Use Authorization (EUA). This EUA will remain in effect (meaning this test can be used) for the duration of the COVID-19 declaration under Section 564(b)(1) of the Act, 21 U.S.C. section 360bbb-3(b)(1), unless the authorization is terminated or revoked.  Performed at [facility], [place] [address], [place], [place] [address]     Radiology Studies: CT Abdomen Pelvis W Contrast  Result Date: 02/01/2021 CLINICAL DATA:  Itching in jaundice for 7 days. Shortness of breath. Dark colored urine. Nausea. Radiation therapy yesterday. History of breast cancer with osseous metastatic disease. EXAM: CT ABDOMEN AND PELVIS WITH CONTRAST TECHNIQUE: Multidetector CT imaging of the abdomen and pelvis was performed using the standard protocol following bolus administration of intravenous contrast. CONTRAST:  30mL OMNIPAQUE IOHEXOL 350 MG/ML SOLN COMPARISON:   12/29/2020 FINDINGS: Lower chest: Right mastectomy. Stable scarring in the right lower lobe. Hepatobiliary: New substantial intrahepatic biliary dilatation observed along with gallbladder wall thickening and high density material in the gallbladder which could be from sludge, proteinaceous fluid, or blood products. There is narrowing and substantial wall enhancement in the right and left hepatic ducts as they converge, raising the possibility of cholangitis. Ill definition of the common bile duct probably from wall thickening. No well-defined stones. Pancreas: Stranding around the pancreatic head along with new mildly dilated dorsal pancreatic duct with truncation of caliber in the pancreatic head region, appearance raises some suspicion for focal pancreatitis. I do not see a specific well-defined mass in the pancreatic head. Spleen: Unremarkable Adrenals/Urinary Tract: New abnormal hydronephrosis and proximal hydroureter extending down to a 8.5 cm dilated segment of the right distal ureter which contains high internal density; this is new compared to the 12/29/2020 exam and accordingly raises suspicion for a blood clot rather than tumor. Mildly delayed nephrogram on the right. No well-defined stone. Adrenal glands unremarkable. Urinary bladder unremarkable.  Stomach/Bowel: No dilated bowel observed.  Normal appendix. Vascular/Lymphatic: Left periaortic lymph node 1.3 cm in short axis on image 42 series 2, previously 1.9 cm. Other retroperitoneal lymph nodes are likewise improved compared to previous. Reproductive: Uterus absent.  Adnexa unremarkable. Other: Stable mild retroperitoneal stranding. Musculoskeletal: Stable widespread scattered sclerotic lesions indicative of prior osseous metastatic disease. Small umbilical hernia contains adipose tissue. IMPRESSION: 1. Since 12/29/2020, there is new substantial intrahepatic biliary dilatation along with dorsal pancreatic duct dilatation. There seems to be abnormal  enhancement and wall thickening in the common hepatic duct and common bile duct, along with gallbladder wall thickening and gallbladder sludge. In addition there is some stranding around the pancreatic head with truncation of the dilated dorsal pancreatic duct. Possibilities may include cholangitis and/or focal pancreatitis involving the pancreatic head. Acute cholecystitis not excluded. A discrete pancreatic head mass is not well seen. 2. In addition, there is new substantial right hydronephrosis and delayed nephrogram on the right extending down to a tapering plug of high density material in the proximal ureter, presumably representing a blood clot in the ureter given that the kidney and ureter had a normal appearance 1 month ago. Tumor in the ureter seems unlikely in this time frame. Severe ureteritis might be a differential diagnostic consideration. 3. Mildly reduced retroperitoneal adenopathy, although the retroperitoneal stranding continues. 4. Stable widespread scattered sclerotic lesions indicative of prior metastatic disease. Electronically Signed   By: Van Clines M.D.   On: 02/01/2021 16:31   US RENAL  Result Date: 02/02/2021 CLINICAL DATA:  Hydronephrosis EXAM: RENAL / URINARY TRACT ULTRASOUND COMPLETE COMPARISON:  02/01/2021 FINDINGS: Right Kidney: Renal measurements: 6.2 x 11.2 x 5.9 cm = volume: 217 mL. Normal right renal cortical echotexture. There is severe right-sided hydronephrosis unchanged since recent CT and MRI. No evidence of nephrolithiasis. Left Kidney: Renal measurements: 5.7 x 11.3 x 4.7 cm = volume: 159 mL. Normal left renal cortical echotexture. There is mild left-sided hydronephrosis, increased since recent ultrasound. No nephrolithiasis. Bladder: A left ureteral jet is identified. The right ureteral jet is not seen. No filling defects or bladder wall thickening. Other: None. IMPRESSION: 1. Severe right-sided hydronephrosis, not appreciably changed since recent CT. There is  no right ureteral jet identified within the bladder, compatible with high-grade obstruction. 2. Mild left-sided hydronephrosis, increased since prior study. 3. No evidence of nephrolithiasis. Electronically Signed   By: Randa Ngo M.D.   On: 02/02/2021 20:41   MR ABDOMEN MRCP WO CONTRAST  Result Date: 02/01/2021 CLINICAL DATA:  Jaundice. Biliary ductal dilatation and right hydronephrosis on recent CT. Metastatic breast carcinoma. EXAM: MRI ABDOMEN WITHOUT CONTRAST  (INCLUDING MRCP) TECHNIQUE: Multiplanar multisequence MR imaging of the abdomen was performed. Heavily T2-weighted images of the biliary and pancreatic ducts were obtained, and three-dimensional MRCP images were rendered by post processing. COMPARISON:  CT on 02/01/2021 FINDINGS: Lower chest: No acute findings. Hepatobiliary: No hepatic masses visualized on this unenhanced exam. Diffuse dilatation of the intrahepatic bile ducts is seen due to stricture of the common hepatic duct in the liver hilum. No evidence of cholecystitis. Pancreas: Pancreatic ductal dilatation is seen with obstruction in the pancreatic head. A solid T2 hypointense mass is seen in the pancreatic head measuring 3.6 x 3.6 cm on image 22/4, which is new since earlier CT on 12/29/2020. Adjacent soft tissue edema is seen within the porta hepatis and throughout the peripancreatic region. This rapid appearance in the past month is suggestive of focal pancreatitis, with metastatic disease or primary pancreatic carcinoma  considered less likely. Spleen:  Within normal limits in size. Adrenals/Urinary tract: No evidence of renal mass or abscess. Moderate right hydronephrosis is also new since 12/29/2020, with soft tissue thickening seen involving the proximal right ureter, with adjacent retroperitoneal edema. These findings may be secondary to pancreatitis, with metastatic disease considered less likely. Stomach/Bowel: No evidence of obstruction or wall thickening. Vascular/Lymphatic:  Mild retroperitoneal lymphadenopathy is seen with largest lymph node measuring 1.5 cm on image 27/4. This shows mild decrease from 2.4 cm on earlier CT of 12/29/2020. Other:  None. 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 IMPRESSION: 3.6 cm masslike area in the pancreatic head with adjacent peripancreatic edema, which also causes diffuse biliary and pancreatic ductal dilatation. Rapid appearance since prior CT approximately 1 month ago is suggestive of focal pancreatitis, although metastatic disease cannot be excluded. Moderate right hydronephrosis with soft tissue prominence involving the proximal right ureter. These findings may also be secondary to acute pancreatitis, although metastatic disease cannot be excluded. Mild decrease in retroperitoneal lymphadenopathy since earlier CT on 10/18/202222. Diffuse bone metastases. Electronically Signed   By: Marlaine Hind M.D.   On: 02/01/2021 21:54   MR 3D Recon At Scanner  Result Date: 02/01/2021 CLINICAL DATA:  Jaundice. Biliary ductal dilatation and right hydronephrosis on recent CT. Metastatic breast carcinoma. EXAM: MRI ABDOMEN WITHOUT CONTRAST  (INCLUDING MRCP) TECHNIQUE: Multiplanar multisequence MR imaging of the abdomen was performed. Heavily T2-weighted images of the biliary and pancreatic ducts were obtained, and three-dimensional MRCP images were rendered by post processing. COMPARISON:  CT on 02/01/2021 FINDINGS: Lower chest: No acute findings. Hepatobiliary: No hepatic masses visualized on this unenhanced exam. Diffuse dilatation of the intrahepatic bile ducts is seen due to stricture of the common hepatic duct in the liver hilum. No evidence of cholecystitis. Pancreas: Pancreatic ductal dilatation is seen with obstruction in the pancreatic head. A solid T2 hypointense mass is seen in the pancreatic head measuring 3.6 x 3.6 cm on image 22/4, which is new since earlier CT on 12/29/2020. Adjacent soft tissue  edema is seen within the porta hepatis and throughout the peripancreatic region. This rapid appearance in the past month is suggestive of focal pancreatitis, with metastatic disease or primary pancreatic carcinoma considered less likely. Spleen:  Within normal limits in size. Adrenals/Urinary tract: No evidence of renal mass or abscess. Moderate right hydronephrosis is also new since 12/29/2020, with soft tissue thickening seen involving the proximal right ureter, with adjacent retroperitoneal edema. These findings may be secondary to pancreatitis, with metastatic disease considered less likely. Stomach/Bowel: No evidence of obstruction or wall thickening. Vascular/Lymphatic: Mild retroperitoneal lymphadenopathy is seen with largest lymph node measuring 1.5 cm on image 27/4. This shows mild decrease from 2.4 cm on earlier CT of 12/29/2020. Other:  None. 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 IMPRESSION: 3.6 cm masslike area in the pancreatic head with adjacent peripancreatic edema, which also causes diffuse biliary and pancreatic ductal dilatation. Rapid appearance since prior CT approximately 1 month ago is suggestive of focal pancreatitis, although metastatic disease cannot be excluded. Moderate right hydronephrosis with soft tissue prominence involving the proximal right ureter. These findings may also be secondary to acute pancreatitis, although metastatic disease cannot be excluded. Mild decrease in retroperitoneal lymphadenopathy since earlier CT on 10/18/202222. Diffuse bone metastases. Electronically Signed   By: Marlaine Hind M.D.   On: 02/01/2021 21:54   DG ERCP BILIARY & PANCREATIC DUCTS  Result Date: 02/02/2021 CLINICAL DATA:  ERCP EXAM:  ERCP TECHNIQUE: Multiple spot images obtained with the fluoroscopic device and submitted for interpretation post-procedure. FLUOROSCOPY TIME:  Refer to procedure report. COMPARISON:  None. FINDINGS: A total of 2  fluoroscopic spot images are submitted for review taken during ERCP procedure. Images demonstrate a scope overlying the upper abdomen. There then appears to be wire catheterization or attempted catheterization through the ampulla. No further images submitted. IMPRESSION: Fluoroscopic spot images during ERCP as described. Refer to procedure report for full details. These images were submitted for radiologic interpretation only. Please see the procedural report for the amount of contrast and the fluoroscopy time utilized. Electronically Signed   By: Albin Felling M.D.   On: 02/02/2021 16:50   DG C-Arm 1-60 Min-No Report  Result Date: 02/02/2021 Fluoroscopy was utilized by the requesting physician.  No radiographic interpretation.    Scheduled Meds:  Chlorhexidine Gluconate Cloth  6 each Topical Daily   cholecalciferol  5,000 Units Oral q morning   feeding supplement  1 Container Oral TID BM   lidocaine       midazolam       multivitamin with minerals  1 tablet Oral Daily   propranolol  10 mg Oral BID   sodium chloride flush  5 mL Intracatheter Q8H   spironolactone  25 mg Oral BID   vitamin B-12  5,000 mcg Oral q morning   Continuous Infusions:   LOS: 1 day    Time spent: 35 mins    Duwan Adrian, MD [facility]   If 7PM-7AM, please contact night-coverage

## 2021-02-03 NOTE — Progress Notes (Signed)
Subjective: [name] is a 55 year old black female with metastatic breast cancer who presented with pruritus and a total bilirubin of 10 with a CT showing evidence of obstruction/inflammation in the right upper quadrant and an MRCP showing Panchal possible pancreatic head mass versus focal pancreatitis versus metastatic lesion which prompted ERCP done by Dr. Carol Ada; due to stenosis of bile duct that ERCP could not be completed and therefore a drain was placed by IR.  [name] seems to be doing much better today after the drain has been placed and abdominal discomfort is improved.  She denies having nausea or vomiting.  Objective: Vital signs in last 24 hours: Temp:  [97.6 F (36.4 C)-98.6 F (37 C)] 97.6 F (36.4 C) (11/23 1338) Pulse Rate:  [80-102] 93 (11/23 1338) Resp:  [14-28] 18 (11/23 1338) BP: (123-179)/(52-107) 162/103 (11/23 1338) SpO2:  [4 %-100 %] 99 % (11/23 1338) Last BM Date: 02/02/21  Intake/Output from previous day: 11/22 0701 - 11/23 0700 In: 2201.2 [I.V.:2001.2; IV Piggyback:200] Out: 750 [Urine:750] Intake/Output this shift: Total I/O In: 155 [Other:5; IV Piggyback:150] Out: 435 [Urine:400; Drains:35]  General appearance: alert, cooperative, appears stated age, and mild distress Resp: clear to auscultation bilaterally Cardio: regular rate and rhythm, S1, S2 normal, no murmur, click, rub or gallop GI: soft, non-tender; bowel sounds normal; no masses,  no organomegaly  Lab Results: Recent Labs    02/01/21 1418 02/02/21 0515 02/03/21 0604  WBC 4.0 3.8* 4.1  HGB 9.8* 8.8* 9.5*  HCT 29.6* 27.1* 28.8*  PLT 221 245 217   BMET Recent Labs    02/01/21 1418 02/02/21 0515 02/03/21 0604  NA 134* 135 135  K 4.1 4.0 4.1  CL 104 106 103  CO2 21* 22 23  GLUCOSE 107* 109* 148*  BUN 15 12 14   CREATININE 1.17* 1.09* 0.93  CALCIUM 10.1 9.7 10.4*   LFT Recent Labs    02/02/21 0515 02/03/21 0604  PROT 6.7 7.2  ALBUMIN 2.8* 2.8*  AST 32 36  ALT 29 31   ALKPHOS 323* 310*  BILITOT 9.8* 10.2*  BILIDIR 6.2*  --   IBILI 3.6*  --    PT/INR Recent Labs    02/01/21 1418 02/02/21 0515  LABPROT 13.7 13.3  INR 1.1 1.0   Studies/Results: CT Abdomen Pelvis W Contrast  Result Date: 02/01/2021 CLINICAL DATA:  Itching in jaundice for 7 days. Shortness of breath. Dark colored urine. Nausea. Radiation therapy yesterday. History of breast cancer with osseous metastatic disease. EXAM: CT ABDOMEN AND PELVIS WITH CONTRAST TECHNIQUE: Multidetector CT imaging of the abdomen and pelvis was performed using the standard protocol following bolus administration of intravenous contrast. CONTRAST:  88mL OMNIPAQUE IOHEXOL 350 MG/ML SOLN COMPARISON:  12/29/2020 FINDINGS: Lower chest: Right mastectomy. Stable scarring in the right lower lobe. Hepatobiliary: New substantial intrahepatic biliary dilatation observed along with gallbladder wall thickening and high density material in the gallbladder which could be from sludge, proteinaceous fluid, or blood products. There is narrowing and substantial wall enhancement in the right and left hepatic ducts as they converge, raising the possibility of cholangitis. Ill definition of the common bile duct probably from wall thickening. No well-defined stones. Pancreas: Stranding around the pancreatic head along with new mildly dilated dorsal pancreatic duct with truncation of caliber in the pancreatic head region, appearance raises some suspicion for focal pancreatitis. I do not see a specific well-defined mass in the pancreatic head. Spleen: Unremarkable Adrenals/Urinary Tract: New abnormal hydronephrosis and proximal hydroureter extending down to a 8.5  cm dilated segment of the right distal ureter which contains high internal density; this is new compared to the 12/29/2020 exam and accordingly raises suspicion for a blood clot rather than tumor. Mildly delayed nephrogram on the right. No well-defined stone. Adrenal glands unremarkable.  Urinary bladder unremarkable. Stomach/Bowel: No dilated bowel observed.  Normal appendix. Vascular/Lymphatic: Left periaortic lymph node 1.3 cm in short axis on image 42 series 2, previously 1.9 cm. Other retroperitoneal lymph nodes are likewise improved compared to previous. Reproductive: Uterus absent.  Adnexa unremarkable. Other: Stable mild retroperitoneal stranding. Musculoskeletal: Stable widespread scattered sclerotic lesions indicative of prior osseous metastatic disease. Small umbilical hernia contains adipose tissue. IMPRESSION: 1. Since 12/29/2020, there is new substantial intrahepatic biliary dilatation along with dorsal pancreatic duct dilatation. There seems to be abnormal enhancement and wall thickening in the common hepatic duct and common bile duct, along with gallbladder wall thickening and gallbladder sludge. In addition there is some stranding around the pancreatic head with truncation of the dilated dorsal pancreatic duct. Possibilities may include cholangitis and/or focal pancreatitis involving the pancreatic head. Acute cholecystitis not excluded. A discrete pancreatic head mass is not well seen. 2. In addition, there is new substantial right hydronephrosis and delayed nephrogram on the right extending down to a tapering plug of high density material in the proximal ureter, presumably representing a blood clot in the ureter given that the kidney and ureter had a normal appearance 1 month ago. Tumor in the ureter seems unlikely in this time frame. Severe ureteritis might be a differential diagnostic consideration. 3. Mildly reduced retroperitoneal adenopathy, although the retroperitoneal stranding continues. 4. Stable widespread scattered sclerotic lesions indicative of prior metastatic disease. Electronically Signed   By: Van Clines M.D.   On: 02/01/2021 16:31   US RENAL  Result Date: 02/02/2021 CLINICAL DATA:  Hydronephrosis EXAM: RENAL / URINARY TRACT ULTRASOUND COMPLETE  COMPARISON:  02/01/2021 FINDINGS: Right Kidney: Renal measurements: 6.2 x 11.2 x 5.9 cm = volume: 217 mL. Normal right renal cortical echotexture. There is severe right-sided hydronephrosis unchanged since recent CT and MRI. No evidence of nephrolithiasis. Left Kidney: Renal measurements: 5.7 x 11.3 x 4.7 cm = volume: 159 mL. Normal left renal cortical echotexture. There is mild left-sided hydronephrosis, increased since recent ultrasound. No nephrolithiasis. Bladder: A left ureteral jet is identified. The right ureteral jet is not seen. No filling defects or bladder wall thickening. Other: None. IMPRESSION: 1. Severe right-sided hydronephrosis, not appreciably changed since recent CT. There is no right ureteral jet identified within the bladder, compatible with high-grade obstruction. 2. Mild left-sided hydronephrosis, increased since prior study. 3. No evidence of nephrolithiasis. Electronically Signed   By: Randa Ngo M.D.   On: 02/02/2021 20:41   MR ABDOMEN MRCP WO CONTRAST  Result Date: 02/01/2021 CLINICAL DATA:  Jaundice. Biliary ductal dilatation and right hydronephrosis on recent CT. Metastatic breast carcinoma. EXAM: MRI ABDOMEN WITHOUT CONTRAST  (INCLUDING MRCP) TECHNIQUE: Multiplanar multisequence MR imaging of the abdomen was performed. Heavily T2-weighted images of the biliary and pancreatic ducts were obtained, and three-dimensional MRCP images were rendered by post processing. COMPARISON:  CT on 02/01/2021 FINDINGS: Lower chest: No acute findings. Hepatobiliary: No hepatic masses visualized on this unenhanced exam. Diffuse dilatation of the intrahepatic bile ducts is seen due to stricture of the common hepatic duct in the liver hilum. No evidence of cholecystitis. Pancreas: Pancreatic ductal dilatation is seen with obstruction in the pancreatic head. A solid T2 hypointense mass is seen in the pancreatic head measuring 3.6 x  3.6 cm on image 22/4, which is new since earlier CT on 12/29/2020.  Adjacent soft tissue edema is seen within the porta hepatis and throughout the peripancreatic region. This rapid appearance in the past month is suggestive of focal pancreatitis, with metastatic disease or primary pancreatic carcinoma considered less likely. Spleen:  Within normal limits in size. Adrenals/Urinary tract: No evidence of renal mass or abscess. Moderate right hydronephrosis is also new since 12/29/2020, with soft tissue thickening seen involving the proximal right ureter, with adjacent retroperitoneal edema. These findings may be secondary to pancreatitis, with metastatic disease considered less likely. Stomach/Bowel: No evidence of obstruction or wall thickening. Vascular/Lymphatic: Mild retroperitoneal lymphadenopathy is seen with largest lymph node measuring 1.5 cm on image 27/4. This shows mild decrease from 2.4 cm on earlier CT of 12/29/2020. Other:  None. 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 IMPRESSION: 3.6 cm masslike area in the pancreatic head with adjacent peripancreatic edema, which also causes diffuse biliary and pancreatic ductal dilatation. Rapid appearance since prior CT approximately 1 month ago is suggestive of focal pancreatitis, although metastatic disease cannot be excluded. Moderate right hydronephrosis with soft tissue prominence involving the proximal right ureter. These findings may also be secondary to acute pancreatitis, although metastatic disease cannot be excluded. Mild decrease in retroperitoneal lymphadenopathy since earlier CT on 10/18/202222. Diffuse bone metastases. Electronically Signed   By: Marlaine Hind M.D.   On: 02/01/2021 21:54   MR 3D Recon At Scanner  Result Date: 02/01/2021 CLINICAL DATA:  Jaundice. Biliary ductal dilatation and right hydronephrosis on recent CT. Metastatic breast carcinoma. EXAM: MRI ABDOMEN WITHOUT CONTRAST  (INCLUDING MRCP) TECHNIQUE: Multiplanar multisequence MR imaging of the abdomen  was performed. Heavily T2-weighted images of the biliary and pancreatic ducts were obtained, and three-dimensional MRCP images were rendered by post processing. COMPARISON:  CT on 02/01/2021 FINDINGS: Lower chest: No acute findings. Hepatobiliary: No hepatic masses visualized on this unenhanced exam. Diffuse dilatation of the intrahepatic bile ducts is seen due to stricture of the common hepatic duct in the liver hilum. No evidence of cholecystitis. Pancreas: Pancreatic ductal dilatation is seen with obstruction in the pancreatic head. A solid T2 hypointense mass is seen in the pancreatic head measuring 3.6 x 3.6 cm on image 22/4, which is new since earlier CT on 12/29/2020. Adjacent soft tissue edema is seen within the porta hepatis and throughout the peripancreatic region. This rapid appearance in the past month is suggestive of focal pancreatitis, with metastatic disease or primary pancreatic carcinoma considered less likely. Spleen:  Within normal limits in size. Adrenals/Urinary tract: No evidence of renal mass or abscess. Moderate right hydronephrosis is also new since 12/29/2020, with soft tissue thickening seen involving the proximal right ureter, with adjacent retroperitoneal edema. These findings may be secondary to pancreatitis, with metastatic disease considered less likely. Stomach/Bowel: No evidence of obstruction or wall thickening. Vascular/Lymphatic: Mild retroperitoneal lymphadenopathy is seen with largest lymph node measuring 1.5 cm on image 27/4. This shows mild decrease from 2.4 cm on earlier CT of 12/29/2020. Other:  None. 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 IMPRESSION: 3.6 cm masslike area in the pancreatic head with adjacent peripancreatic edema, which also causes diffuse biliary and pancreatic ductal dilatation. Rapid appearance since prior CT approximately 1 month ago is suggestive of focal pancreatitis, although metastatic disease cannot  be excluded. Moderate right hydronephrosis with soft tissue prominence involving the proximal right ureter. These findings may also be secondary to acute pancreatitis, although metastatic  disease cannot be excluded. Mild decrease in retroperitoneal lymphadenopathy since earlier CT on 10/18/202222. Diffuse bone metastases. Electronically Signed   By: Marlaine Hind M.D.   On: 02/01/2021 21:54   DG ERCP BILIARY & PANCREATIC DUCTS  Result Date: 02/02/2021 CLINICAL DATA:  ERCP EXAM: ERCP TECHNIQUE: Multiple spot images obtained with the fluoroscopic device and submitted for interpretation post-procedure. FLUOROSCOPY TIME:  Refer to procedure report. COMPARISON:  None. FINDINGS: A total of 2 fluoroscopic spot images are submitted for review taken during ERCP procedure. Images demonstrate a scope overlying the upper abdomen. There then appears to be wire catheterization or attempted catheterization through the ampulla. No further images submitted. IMPRESSION: Fluoroscopic spot images during ERCP as described. Refer to procedure report for full details. These images were submitted for radiologic interpretation only. Please see the procedural report for the amount of contrast and the fluoroscopy time utilized. Electronically Signed   By: Albin Felling M.D.   On: 02/02/2021 16:50   DG C-Arm 1-60 Min-No Report  Result Date: 02/02/2021 Fluoroscopy was utilized by the requesting physician.  No radiographic interpretation.   IR BILIARY DRAIN PLACEMENT WITH CHOLANGIOGRAM  Result Date: 02/03/2021 INDICATION: 55 year old woman with metastatic breast cancer and common bile duct obstruction presents to interventional radiology for biliary drain placement. EXAM: Ultrasound and fluoroscopy guided internal external biliary drain placement MEDICATIONS: Cefoxitin 2 g IV; The antibiotic was administered within an appropriate time frame prior to the initiation of the procedure. ANESTHESIA/SEDATION: Moderate (conscious)  sedation was employed during this procedure. A total of Versed 3 mg and Fentanyl 100 mcg was administered intravenously by the radiology nurse. Total intra-service moderate Sedation Time: 17 minutes. The [name]'s level of consciousness and vital signs were monitored continuously by radiology nursing throughout the procedure under my direct supervision. FLUOROSCOPY TIME:  Fluoroscopy Time: 2 minutes 54 seconds (41 mGy). COMPLICATIONS: None immediate. PROCEDURE: Informed written consent was obtained from the [name] after a thorough discussion of the procedural risks, benefits and alternatives. All questions were addressed. Maximal Sterile Barrier Technique was utilized including caps, mask, sterile gowns, sterile gloves, sterile drape, hand hygiene and skin antiseptic. A timeout was performed prior to the initiation of the procedure. [name] positioned supine on the procedure table. Epigastric skin prepped and draped in usual fashion. Ultrasound evaluation demonstrated moderate dilated left bile ducts. Using continuous ultrasound guidance, left hepatic lobe bile duct was accessed with a 21 gauge needle. Contrast administered through the needle under fluoroscopy opacified the biliary tree. 0.018 inch guidewire was advanced through the 21 gauge needle. 21 gauge needle exchanged for transitional dilator set. Transitional dilator set exchanged for Kumpe the catheter over 0.035 inch guidewire. Kumpe catheter advanced to the duodenum. 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 Kumpe catheter exchanged for 10 French internal external biliary drain. Contrast administered through the drain under fluoroscopy confirmed appropriate positioning. Drain was secured to skin with suture and connected to bag. IMPRESSION: 1. Ultrasound fluoroscopy guided 10 French internal external biliary drain placement as above. 2. 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 Electronically Signed   By: Miachel Roux M.D.   On:  02/03/2021 15:37    Medications: I have reviewed the [name]'s current medications.  Assessment/Plan: 1) Acute biliary obstruction-with MRCP showing some focal pancreatitis versus pancreatic head mass question metastatic lesion-IR placed a drain successfully [name] seems to be doing well will need to monitor her LFTs closely. 2) right ureteral hydronephrosis. 3) Essential hypertension. 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  LOS: 1 day   Juanita Craver 02/03/2021, 3:49 PM

## 2021-02-03 NOTE — Progress Notes (Signed)
Appreciate GI's urology's and IRs efforts on behalf of this patient. It is still not clear to me what caused the stenosis on the CBD. If this is likely to be chronic placing an internal stent should be more comfortable for the patient.   If she is discharged she has an appointment with me 11/28 and I will follow labs at that time  GM

## 2021-02-04 ENCOUNTER — Inpatient Hospital Stay (HOSPITAL_COMMUNITY): Payer: BC Managed Care – PPO

## 2021-02-04 ENCOUNTER — Ambulatory Visit: Payer: BC Managed Care – PPO

## 2021-02-04 DIAGNOSIS — R17 Unspecified jaundice: Secondary | ICD-10-CM | POA: Diagnosis not present

## 2021-02-04 LAB — CBC
HCT: 30.6 % — ABNORMAL LOW (ref 36.0–46.0)
Hemoglobin: 10 g/dL — ABNORMAL LOW (ref 12.0–15.0)
MCH: 33.2 pg (ref 26.0–34.0)
MCHC: 32.7 g/dL (ref 30.0–36.0)
MCV: 101.7 fL — ABNORMAL HIGH (ref 80.0–100.0)
Platelets: 245 10*3/uL (ref 150–400)
RBC: 3.01 MIL/uL — ABNORMAL LOW (ref 3.87–5.11)
RDW: 16.1 % — ABNORMAL HIGH (ref 11.5–15.5)
WBC: 5.6 10*3/uL (ref 4.0–10.5)
nRBC: 0 % (ref 0.0–0.2)

## 2021-02-04 LAB — COMPREHENSIVE METABOLIC PANEL
ALT: 36 U/L (ref 0–44)
AST: 46 U/L — ABNORMAL HIGH (ref 15–41)
Albumin: 2.6 g/dL — ABNORMAL LOW (ref 3.5–5.0)
Alkaline Phosphatase: 315 U/L — ABNORMAL HIGH (ref 38–126)
Anion gap: 7 (ref 5–15)
BUN: 16 mg/dL (ref 6–20)
CO2: 26 mmol/L (ref 22–32)
Calcium: 9.4 mg/dL (ref 8.9–10.3)
Chloride: 102 mmol/L (ref 98–111)
Creatinine, Ser: 1.09 mg/dL — ABNORMAL HIGH (ref 0.44–1.00)
GFR, Estimated: >60 mL/min (ref >60)
Glucose, Bld: 121 mg/dL — ABNORMAL HIGH (ref 70–99)
Potassium: 4.2 mmol/L (ref 3.5–5.1)
Sodium: 135 mmol/L (ref 135–145)
Total Bilirubin: 10.9 mg/dL — ABNORMAL HIGH (ref 0.3–1.2)
Total Protein: 7.1 g/dL (ref 6.5–8.1)

## 2021-02-04 LAB — MAGNESIUM: Magnesium: 2 mg/dL (ref 1.7–2.4)

## 2021-02-04 LAB — PHOSPHORUS: Phosphorus: 3.6 mg/dL (ref 2.5–4.6)

## 2021-02-04 NOTE — Progress Notes (Signed)
PROGRESS NOTE    [name]  QIH:[id number] DOB: 1966/01/02 DOA: 02/01/2021 PCP: [name], MD   Brief Narrative: This 55 year old Female with PMH significant of metastatic breast cancer, Essential hypertension presented in the ED with complaints of generalized pruritus. She had follow-up with oncologist and was diagnosed with elevated total bilirubin.  Initially It was thought due to her immunotherapy Verzenio and it was stopped.  Then she started having episodes of nausea,  vomiting, change in her stool color.  Work-up showed elevated total bilirubin. CT abdomen pelvis showed concerning new right upper quadrant ductal dilation and concerning features of pancreatitis along with possible cholangitis/cholecystitis. GI team was consulted.  Patient underwent ERCP which could not cannulate biliary duct.  IR was consulted for percutaneous drainage which is complete.  Assessment & Plan:   Principal Problem:   Jaundice Active Problems:   Essential hypertension, benign   Malignant neoplasm of upper-outer quadrant of right breast in female, estrogen receptor positive ([place])   Acute pancreatitis   Biliary obstruction: Acute pancreatitis versus metastatic lesion: Patient presented with abdominal pain, nausea, vomiting,  jaundice,  change in stool color and pruritus. CT showed  some evidence of obstruction/inflammation in the right upper quadrant area.  MRCP showed pancreatic head mass concerning for focal acute pancreatitis versus metastatic lesion.   Gastroenterology consulted.  Patient underwent ERCP which could not cannulate biliary duct. IR was consulted, patient underwent successful percutaneous drainage. Continue to monitor comprehensive metabolic panel in the morning. Continue Zofran and Phenergan as needed for nausea and vomiting. Follow-up blood cultures.  Right ureteral hydronephrosis: UA negative.  Urine culture pending Renal ultrasound shows Severe right-sided  hydronephrosis compatible with high-grade stenosis. Urology is consulted, there is no need for any stenting.  Patient denies any flank pain, serum creatinine remains normal. Repeat renal ultrasound shows improving Right sided hydronephrosis.   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 [name].  Microcytic anemia: H&H is stable.    DVT prophylaxis: SCDs Code Status: Full code. Family Communication: No family at bed side. Disposition Plan:   Status is: Inpatient  Remains inpatient appropriate because:  ERCP could not cannulate the biliary duct.  Patient underwent successful percutaneous biliary drain. Continue to monitor CMP which is improving.  Anticipated discharge home 11/25.   Consultants:  GI IR  Procedures: ERCP ,   Percutaneous biliary drain.  Antimicrobials:   Anti-infectives (From admission, onward)    Start     Dose/Rate Route Frequency Ordered Stop   02/03/21 1130  cefOXitin (MEFOXIN) 2 g in sodium chloride 0.9 % 100 mL IVPB        2 g 200 mL/hr over 30 Minutes Intravenous  Once 02/03/21 1033 02/03/21 1124        Subjective: Patient was seen and examined at bedside.  Overnight events noted.   Patient had ERCP which was not successful. Patient underwent percutaneous biliary drain by IR 11/23. Patient reports abdominal pain is improving.  Denies any nausea and vomiting.  Objective: Vitals:   02/03/21 1305 02/03/21 1338 02/03/21 2050 02/04/21 0445  BP: (!) 160/98 (!) 162/103 134/83 129/88  Pulse: 93 93 (!) 106 (!) 105  Resp: 19 18 18 16   Temp:  97.6 F (36.4 C) 98.9 F (37.2 C) 98.6 F (37 C)  TempSrc:  Oral Oral Oral  SpO2: 100% 99% 98% 98%  Weight:      Height:  Intake/Output Summary (Last 24 hours) at 02/04/2021 1113 Last data filed at 02/04/2021 0500 Gross per 24 hour  Intake 155 ml  Output 1360 ml  Net -1205 ml   Filed Weights    02/01/21 1300 02/02/21 1347  Weight: 91.2 kg 91.2 kg    Examination:  General exam: Appears comfortable, not in any acute distress.  Deconditioned Respiratory system: Clear to auscultation bilaterally. Respiratory effort normal. RR 17 Cardiovascular system: S1-S2 heard, regular rate and rhythm, no murmur. Chest: Chemo-Port noted in the left chest. Gastrointestinal system: Abdomen is soft, Mildly tender, mildly distended, BS+ biliary drain noted with clear yellow-colored fluid. Central nervous system: Alert and oriented x 3. No focal neurological deficits. Extremities: No edema, no cyanosis, no clubbing. Skin: No rashes, lesions or ulcers Psychiatry: Judgement and insight appear normal. Mood & affect appropriate.     Data Reviewed: I have personally reviewed following labs and imaging studies  CBC: Recent Labs  Lab 02/01/21 1109 02/01/21 1418 02/02/21 0515 02/03/21 0604 02/04/21 0540  WBC 3.2* 4.0 3.8* 4.1 5.6  NEUTROABS 1.8 2.1 2.0  --   --   HGB 10.5* 9.8* 8.8* 9.5* 10.0*  HCT 31.2* 29.6* 27.1* 28.8* 30.6*  MCV 98.7 101.4* 102.3* 102.1* 101.7*  PLT 236 221 245 217 035   Basic Metabolic Panel: Recent Labs  Lab 02/01/21 1109 02/01/21 1418 02/02/21 0515 02/03/21 0604 02/04/21 0540  NA 134* 134* 135 135 135  K 4.1 4.1 4.0 4.1 4.2  CL 104 104 106 103 102  CO2 20* 21* 22 23 26   GLUCOSE 141* 107* 109* 148* 121*  BUN 14 15 12 14 16   CREATININE 1.29* 1.17* 1.09* 0.93 1.09*  CALCIUM 10.2 10.1 9.7 10.4* 9.4  MG  --   --   --  1.9 2.0  PHOS  --   --   --   --  3.6   GFR: Estimated Creatinine Clearance: 72.3 mL/min (A) (by C-G formula based on SCr of 1.09 mg/dL (H)). Liver Function Tests: Recent Labs  Lab 02/01/21 1109 02/01/21 1418 02/02/21 0515 02/03/21 0604 02/04/21 0540  AST 33 31 32 36 46*  ALT 31 30 29 31  36  ALKPHOS 386* 324* 323* 310* 315*  BILITOT 11.2* 10.5* 9.8* 10.2* 10.9*  PROT 7.7 7.5 6.7 7.2 7.1  ALBUMIN 3.0* 3.1* 2.8* 2.8* 2.6*   Recent Labs   Lab 02/01/21 1418 02/01/21 1454  LIPASE 122*  --   AMYLASE  --  132*   No results for input(s): AMMONIA in the last 168 hours. Coagulation Profile: Recent Labs  Lab 02/01/21 1418 02/02/21 0515  INR 1.1 1.0   Cardiac Enzymes: No results for input(s): CKTOTAL, CKMB, CKMBINDEX, TROPONINI in the last 168 hours. BNP (last 3 results) No results for input(s): PROBNP in the last 8760 hours. HbA1C: No results for input(s): HGBA1C in the last 72 hours. CBG: No results for input(s): GLUCAP in the last 168 hours. Lipid Profile: No results for input(s): CHOL, HDL, LDLCALC, TRIG, CHOLHDL, LDLDIRECT in the last 72 hours. Thyroid Function Tests: No results for input(s): TSH, T4TOTAL, FREET4, T3FREE, THYROIDAB in the last 72 hours.  Anemia Panel: No results for input(s): VITAMINB12, FOLATE, FERRITIN, TIBC, IRON, RETICCTPCT in the last 72 hours. Sepsis Labs: No results for input(s): PROCALCITON, LATICACIDVEN in the last 168 hours.  Recent Results (from the past 240 hour(s))  Resp Panel by RT-PCR (Flu A&B, Covid) Nasopharyngeal Swab     Status: None   Collection Time: 02/01/21  6:27 PM  Specimen: Nasopharyngeal Swab; Nasopharyngeal(NP) swabs in vial transport medium  Result Value Ref Range Status   SARS Coronavirus 2 by RT PCR NEGATIVE NEGATIVE Final    Comment: (NOTE) SARS-CoV-2 target nucleic acids are NOT DETECTED.  The SARS-CoV-2 RNA is generally detectable in upper respiratory specimens during the acute phase of infection. The lowest concentration of SARS-CoV-2 viral copies this assay can detect is 138 copies/mL. A negative result does not preclude SARS-Cov-2 infection and should not be used as the sole basis for treatment or other patient management decisions. A negative result may occur with  improper specimen collection/handling, submission of specimen other than nasopharyngeal swab, presence of viral mutation(s) within the areas targeted by this assay, and inadequate number  of viral copies(<138 copies/mL). A negative result must be combined with clinical observations, patient history, and epidemiological information. The expected result is Negative.  Fact Sheet for Patients:  EntrepreneurPulse.com.au  Fact Sheet for Healthcare Providers:  IncredibleEmployment.be  This test is no t yet approved or cleared by the [place] FDA and  has been authorized for detection and/or diagnosis of SARS-CoV-2 by FDA under an Emergency Use Authorization (EUA). This EUA will remain  in effect (meaning this test can be used) for the duration of the COVID-19 declaration under Section 564(b)(1) of the Act, 21 U.S.C.section 360bbb-3(b)(1), unless the authorization is terminated  or revoked sooner.       Influenza A by PCR NEGATIVE NEGATIVE Final   Influenza B by PCR NEGATIVE NEGATIVE Final    Comment: (NOTE) The Xpert Xpress SARS-CoV-2/FLU/RSV plus assay is intended as an aid in the diagnosis of influenza from Nasopharyngeal swab specimens and should not be used as a sole basis for treatment. Nasal washings and aspirates are unacceptable for Xpert Xpress SARS-CoV-2/FLU/RSV testing.  Fact Sheet for Patients: EntrepreneurPulse.com.au  Fact Sheet for Healthcare Providers: IncredibleEmployment.be  This test is not yet approved or cleared by the [place] FDA and has been authorized for detection and/or diagnosis of SARS-CoV-2 by FDA under an Emergency Use Authorization (EUA). This EUA will remain in effect (meaning this test can be used) for the duration of the COVID-19 declaration under Section 564(b)(1) of the Act, 21 U.S.C. section 360bbb-3(b)(1), unless the authorization is terminated or revoked.  Performed at [facility], [place] [address], [place], [place] [address]   Aerobic/Anaerobic Culture w Gram Stain (surgical/deep wound)     Status: None (Preliminary result)    Collection Time: 02/03/21 11:18 AM   Specimen: BILE  Result Value Ref Range Status   Specimen Description   Final    BILE Performed at [place] [address] [place], [place] [address]    Special Requests   Final    NONE Performed at [facility], [place] [address] [place], [place] [address]    Gram Stain PENDING  Incomplete   Culture   Final    RARE STREPTOCOCCUS PARASANGUINIS CULTURE REINCUBATED FOR BETTER GROWTH Performed at [facility], [place] [address], [place], [place] [address]    Report Status PENDING  Incomplete    Radiology Studies: US RENAL  Result Date: 02/04/2021 CLINICAL DATA:  55 year old female for follow-up of hydronephrosis. EXAM: RENAL / URINARY TRACT ULTRASOUND COMPLETE COMPARISON:  02/02/2021 FINDINGS: Right Kidney: Renal measurements: 10.8 x 7.1 x 7.6 cm = volume: 303 mL. RIGHT hydronephrosis appears slightly improved since 02/02/2021, now moderate to severe. No renal mass is identified. Renal echogenicity is UPPER limits of normal. Left Kidney: Renal  measurements: 10.9 x 6.5 x 6.9 cm = volume: 253 mL. Mild LEFT hydronephrosis is not significantly changed. No renal mass identified. Renal echogenicity is UPPER limits of normal. Bladder: RIGHT renal jet is again not visualized. A normal LEFT ureteral jet is present. No other abnormalities noted. Other: None. IMPRESSION: 1. Apparent slight improvement in RIGHT hydronephrosis, now moderate to severe. RIGHT ureteral jet again not visualized compatible with high-grade obstruction. 2. Unchanged mild LEFT hydronephrosis. 3. No other significant change. Electronically Signed   By: Margarette Canada M.D.   On: 02/04/2021 10:35   US RENAL  Result Date: 02/02/2021 CLINICAL DATA:  Hydronephrosis EXAM: RENAL / URINARY TRACT ULTRASOUND COMPLETE COMPARISON:  02/01/2021 FINDINGS: Right Kidney: Renal measurements: 6.2 x 11.2 x 5.9 cm = volume: 217 mL. Normal right renal cortical  echotexture. There is severe right-sided hydronephrosis unchanged since recent CT and MRI. No evidence of nephrolithiasis. Left Kidney: Renal measurements: 5.7 x 11.3 x 4.7 cm = volume: 159 mL. Normal left renal cortical echotexture. There is mild left-sided hydronephrosis, increased since recent ultrasound. No nephrolithiasis. Bladder: A left ureteral jet is identified. The right ureteral jet is not seen. No filling defects or bladder wall thickening. Other: None. IMPRESSION: 1. Severe right-sided hydronephrosis, not appreciably changed since recent CT. There is no right ureteral jet identified within the bladder, compatible with high-grade obstruction. 2. Mild left-sided hydronephrosis, increased since prior study. 3. No evidence of nephrolithiasis. Electronically Signed   By: Randa Ngo M.D.   On: 02/02/2021 20:41   DG ERCP BILIARY & PANCREATIC DUCTS  Result Date: 02/02/2021 CLINICAL DATA:  ERCP EXAM: ERCP TECHNIQUE: Multiple spot images obtained with the fluoroscopic device and submitted for interpretation post-procedure. FLUOROSCOPY TIME:  Refer to procedure report. COMPARISON:  None. FINDINGS: A total of 2 fluoroscopic spot images are submitted for review taken during ERCP procedure. Images demonstrate a scope overlying the upper abdomen. There then appears to be wire catheterization or attempted catheterization through the ampulla. No further images submitted. IMPRESSION: Fluoroscopic spot images during ERCP as described. Refer to procedure report for full details. These images were submitted for radiologic interpretation only. Please see the procedural report for the amount of contrast and the fluoroscopy time utilized. Electronically Signed   By: Albin Felling M.D.   On: 02/02/2021 16:50   DG C-Arm 1-60 Min-No Report  Result Date: 02/02/2021 Fluoroscopy was utilized by the requesting physician.  No radiographic interpretation.   IR BILIARY DRAIN PLACEMENT WITH CHOLANGIOGRAM  Result Date:  02/03/2021 INDICATION: 55 year old woman with metastatic breast cancer and common bile duct obstruction presents to interventional radiology for biliary drain placement. EXAM: Ultrasound and fluoroscopy guided internal external biliary drain placement MEDICATIONS: Cefoxitin 2 g IV; The antibiotic was administered within an appropriate time frame prior to the initiation of the procedure. ANESTHESIA/SEDATION: Moderate (conscious) sedation was employed during this procedure. A total of Versed 3 mg and Fentanyl 100 mcg was administered intravenously by the radiology nurse. Total intra-service moderate Sedation Time: 17 minutes. The patient's level of consciousness and vital signs were monitored continuously by radiology nursing throughout the procedure under my direct supervision. FLUOROSCOPY TIME:  Fluoroscopy Time: 2 minutes 54 seconds (41 mGy). COMPLICATIONS: None immediate. PROCEDURE: Informed written consent was obtained from the patient after a thorough discussion of the procedural risks, benefits and alternatives. All questions were addressed. Maximal Sterile Barrier Technique was utilized including caps, mask, sterile gowns, sterile gloves, sterile drape, hand hygiene and skin antiseptic. A timeout was performed prior to the initiation  of the procedure. Patient positioned supine on the procedure table. Epigastric skin prepped and draped in usual fashion. Ultrasound evaluation demonstrated moderate dilated left bile ducts. Using continuous ultrasound guidance, left hepatic lobe bile duct was accessed with a 21 gauge needle. Contrast administered through the needle under fluoroscopy opacified the biliary tree. 0.018 inch guidewire was advanced through the 21 gauge needle. 21 gauge needle exchanged for transitional dilator set. Transitional dilator set exchanged for Kumpe the catheter over 0.035 inch guidewire. Kumpe catheter advanced to the duodenum. 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 Kumpe  catheter exchanged for 10 French internal external biliary drain. Contrast administered through the drain under fluoroscopy confirmed appropriate positioning. Drain was secured to skin with suture and connected to bag. IMPRESSION: 1. Ultrasound fluoroscopy guided 10 French internal external biliary drain placement as above. 2. 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 Electronically Signed   By: Miachel Roux M.D.   On: 02/03/2021 15:37    Scheduled Meds:  Chlorhexidine Gluconate Cloth  6 each Topical Daily   cholecalciferol  5,000 Units Oral q morning   feeding supplement  1 Container Oral TID BM   multivitamin with minerals  1 tablet Oral Daily   propranolol  10 mg Oral BID   sodium chloride flush  5 mL Intracatheter Q8H   spironolactone  25 mg Oral BID   vitamin B-12  5,000 mcg Oral q morning   Continuous Infusions:  promethazine (PHENERGAN) injection (IM or IVPB)       LOS: 2 days    Time spent: 25 mins    Shawna Clamp, MD [facility]   If 7PM-7AM, please contact night-coverage

## 2021-02-05 ENCOUNTER — Encounter (HOSPITAL_COMMUNITY): Payer: Self-pay | Admitting: Gastroenterology

## 2021-02-05 ENCOUNTER — Telehealth: Payer: Self-pay | Admitting: Oncology

## 2021-02-05 DIAGNOSIS — R17 Unspecified jaundice: Secondary | ICD-10-CM | POA: Diagnosis not present

## 2021-02-05 LAB — COMPREHENSIVE METABOLIC PANEL
ALT: 33 U/L (ref 0–44)
AST: 36 U/L (ref 15–41)
Albumin: 2.5 g/dL — ABNORMAL LOW (ref 3.5–5.0)
Alkaline Phosphatase: 285 U/L — ABNORMAL HIGH (ref 38–126)
Anion gap: 9 (ref 5–15)
BUN: 17 mg/dL (ref 6–20)
CO2: 27 mmol/L (ref 22–32)
Calcium: 9.9 mg/dL (ref 8.9–10.3)
Chloride: 98 mmol/L (ref 98–111)
Creatinine, Ser: 0.96 mg/dL (ref 0.44–1.00)
GFR, Estimated: >60 mL/min (ref >60)
Glucose, Bld: 106 mg/dL — ABNORMAL HIGH (ref 70–99)
Potassium: 4 mmol/L (ref 3.5–5.1)
Sodium: 134 mmol/L — ABNORMAL LOW (ref 135–145)
Total Bilirubin: 11 mg/dL — ABNORMAL HIGH (ref 0.3–1.2)
Total Protein: 7.2 g/dL (ref 6.5–8.1)

## 2021-02-05 LAB — CBC
HCT: 30.9 % — ABNORMAL LOW (ref 36.0–46.0)
Hemoglobin: 10.2 g/dL — ABNORMAL LOW (ref 12.0–15.0)
MCH: 33.2 pg (ref 26.0–34.0)
MCHC: 33 g/dL (ref 30.0–36.0)
MCV: 100.7 fL — ABNORMAL HIGH (ref 80.0–100.0)
Platelets: 253 10*3/uL (ref 150–400)
RBC: 3.07 MIL/uL — ABNORMAL LOW (ref 3.87–5.11)
RDW: 15.8 % — ABNORMAL HIGH (ref 11.5–15.5)
WBC: 8.5 10*3/uL (ref 4.0–10.5)
nRBC: 0.5 % — ABNORMAL HIGH (ref 0.0–0.2)

## 2021-02-05 LAB — MAGNESIUM: Magnesium: 2.1 mg/dL (ref 1.7–2.4)

## 2021-02-05 MED ORDER — TRAMADOL HCL 50 MG PO TABS
50.0000 mg | ORAL_TABLET | Freq: Once | ORAL | Status: AC
  Administered 2021-02-05: 50 mg via ORAL
  Filled 2021-02-05: qty 1

## 2021-02-05 MED ORDER — SIMETHICONE 80 MG PO CHEW
80.0000 mg | CHEWABLE_TABLET | Freq: Once | ORAL | Status: AC
  Administered 2021-02-05: 80 mg via ORAL
  Filled 2021-02-05: qty 1

## 2021-02-05 NOTE — Telephone Encounter (Signed)
Scheduled appointment per scheduling message. Patient is aware. 

## 2021-02-05 NOTE — Progress Notes (Signed)
PROGRESS NOTE    [name]  OZD:[id number] DOB: 1965-05-26 DOA: 02/01/2021  PCP: [name], MD   Brief Narrative: This 55 year old Female with PMH significant of metastatic breast cancer, Essential hypertension presented in the ED with complaints of generalized pruritus. She had follow-up with oncologist and was diagnosed with elevated total bilirubin.  Initially It was thought due to her immunotherapy Verzenio and it was stopped.  Then she started having episodes of nausea,  vomiting, change in her stool color.  Work-up showed elevated total bilirubin. CT abdomen pelvis showed concerning new right upper quadrant ductal dilation and concerning features of pancreatitis along with possible cholangitis/cholecystitis. GI team was consulted.  Patient underwent ERCP which could not cannulate biliary duct.  IR was consulted for percutaneous drainage which is complete.  Assessment & Plan:   Principal Problem:   Jaundice Active Problems:   Essential hypertension, benign   Malignant neoplasm of upper-outer quadrant of right breast in female, estrogen receptor positive ([place])   Acute pancreatitis   Biliary obstruction: Acute pancreatitis versus metastatic lesion: Patient presented with abdominal pain, nausea, vomiting,  jaundice,  change in stool color and pruritus. CT showed  some evidence of obstruction/inflammation in the right upper quadrant area.  MRCP showed pancreatic head mass concerning for focal acute pancreatitis versus metastatic lesion.   Gastroenterology consulted.  Patient underwent ERCP which could not cannulate biliary duct. IR was consulted, patient underwent successful percutaneous drainage. 11/23 Continue to monitor CMP. Liver enzymes trending down. Continue Zofran and Phenergan as needed for nausea and vomiting. Follow-up cultures. She still has significant output in the biliary drain. Follow-up IR for further recommendation.  Right ureteral hydronephrosis: UA  negative.  Urine culture not collected. Renal ultrasound shows Severe right-sided hydronephrosis compatible with high-grade stenosis. Urology is consulted, there is no need for any stenting.  Patient denies any flank pain, serum creatinine remains normal. Repeat renal ultrasound shows improving Right sided hydronephrosis. She denies any urinary symptoms.   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 [name].  Microcytic anemia: H&H is stable.    DVT prophylaxis: SCDs Code Status: Full code. Family Communication: No family at bed side. Disposition Plan:   Status is: Inpatient  Remains inpatient appropriate because:  ERCP could not cannulate the biliary duct.  Patient underwent successful percutaneous biliary drain. Continue to monitor CMP which is improving.  Anticipated discharge home 11/26.  Follow-up IR.   Consultants:  GI IR  Procedures: ERCP ,   Percutaneous biliary drain.  Antimicrobials:   Anti-infectives (From admission, onward)    Start     Dose/Rate Route Frequency Ordered Stop   02/03/21 1130  cefOXitin (MEFOXIN) 2 g in sodium chloride 0.9 % 100 mL IVPB        2 g 200 mL/hr over 30 Minutes Intravenous  Once 02/03/21 1033 02/03/21 1124        Subjective: Patient was seen and examined at bedside.  Overnight events noted.   Patient underwent percutaneous biliary drain by IR 11/23. Patient reports abdominal pain is improving.  She denies nausea and vomiting.   She still has significant amount of drain output in her biliary drain.  Objective: Vitals:   02/04/21 2225 02/05/21 0148 02/05/21 0516 02/05/21 0947  BP: 125/79 129/85 (!) 142/94 116/86  Pulse: (!) 109 (!) 110 (!) 110 (!) 117  Resp: 20 16 16    Temp: 99 F (37.2 C) 99 F (37.2 C) 98.7  F (37.1 C)   TempSrc: Oral Oral Oral   SpO2: 98% 96% 96%   Weight:      Height:        Intake/Output Summary  (Last 24 hours) at 02/05/2021 1054 Last data filed at 02/05/2021 0953 Gross per 24 hour  Intake --  Output 3320 ml  Net -3320 ml   Filed Weights   02/01/21 1300 02/02/21 1347  Weight: 91.2 kg 91.2 kg    Examination:  General exam: Deconditioned.  Appears comfortable, not in any acute distress. Respiratory system: Clear to auscultation bilaterally. Respiratory effort normal. RR 17 Cardiovascular system: S1-S2 heard, regular rate and rhythm, no murmur. Chest: Chemo-Port noted in the left chest. Gastrointestinal system: Abdomen is soft, mildly tender+, mildly distended, BS+.  Biliary drain noted with clear yellow-colored fluid. Central nervous system: Alert and oriented x 3. No focal neurological deficits. Extremities: No edema, no cyanosis, no clubbing. Skin: No rashes, lesions or ulcers Psychiatry: Judgement and insight appear normal. Mood & affect appropriate.     Data Reviewed: I have personally reviewed following labs and imaging studies  CBC: Recent Labs  Lab 02/01/21 1109 02/01/21 1418 02/02/21 0515 02/03/21 0604 02/04/21 0540 02/05/21 0444  WBC 3.2* 4.0 3.8* 4.1 5.6 8.5  NEUTROABS 1.8 2.1 2.0  --   --   --   HGB 10.5* 9.8* 8.8* 9.5* 10.0* 10.2*  HCT 31.2* 29.6* 27.1* 28.8* 30.6* 30.9*  MCV 98.7 101.4* 102.3* 102.1* 101.7* 100.7*  PLT 236 221 245 217 245 470   Basic Metabolic Panel: Recent Labs  Lab 02/01/21 1418 02/02/21 0515 02/03/21 0604 02/04/21 0540 02/05/21 0444  NA 134* 135 135 135 134*  K 4.1 4.0 4.1 4.2 4.0  CL 104 106 103 102 98  CO2 21* 22 23 26 27   GLUCOSE 107* 109* 148* 121* 106*  BUN 15 12 14 16 17   CREATININE 1.17* 1.09* 0.93 1.09* 0.96  CALCIUM 10.1 9.7 10.4* 9.4 9.9  MG  --   --  1.9 2.0 2.1  PHOS  --   --   --  3.6  --    GFR: Estimated Creatinine Clearance: 82.1 mL/min (by C-G formula based on SCr of 0.96 mg/dL). Liver Function Tests: Recent Labs  Lab 02/01/21 1418 02/02/21 0515 02/03/21 0604 02/04/21 0540 02/05/21 0444   AST 31 32 36 46* 36  ALT 30 29 31  36 33  ALKPHOS 324* 323* 310* 315* 285*  BILITOT 10.5* 9.8* 10.2* 10.9* 11.0*  PROT 7.5 6.7 7.2 7.1 7.2  ALBUMIN 3.1* 2.8* 2.8* 2.6* 2.5*   Recent Labs  Lab 02/01/21 1418 02/01/21 1454  LIPASE 122*  --   AMYLASE  --  132*   No results for input(s): AMMONIA in the last 168 hours. Coagulation Profile: Recent Labs  Lab 02/01/21 1418 02/02/21 0515  INR 1.1 1.0   Cardiac Enzymes: No results for input(s): CKTOTAL, CKMB, CKMBINDEX, TROPONINI in the last 168 hours. BNP (last 3 results) No results for input(s): PROBNP in the last 8760 hours. HbA1C: No results for input(s): HGBA1C in the last 72 hours. CBG: No results for input(s): GLUCAP in the last 168 hours. Lipid Profile: No results for input(s): CHOL, HDL, LDLCALC, TRIG, CHOLHDL, LDLDIRECT in the last 72 hours. Thyroid Function Tests: No results for input(s): TSH, T4TOTAL, FREET4, T3FREE, THYROIDAB in the last 72 hours.  Anemia Panel: No results for input(s): VITAMINB12, FOLATE, FERRITIN, TIBC, IRON, RETICCTPCT in the last 72 hours. Sepsis Labs: No results for input(s): PROCALCITON, LATICACIDVEN  in the last 168 hours.  Recent Results (from the past 240 hour(s))  Resp Panel by RT-PCR (Flu A&B, Covid) Nasopharyngeal Swab     Status: None   Collection Time: 02/01/21  6:27 PM   Specimen: Nasopharyngeal Swab; Nasopharyngeal(NP) swabs in vial transport medium  Result Value Ref Range Status   SARS Coronavirus 2 by RT PCR NEGATIVE NEGATIVE Final    Comment: (NOTE) SARS-CoV-2 target nucleic acids are NOT DETECTED.  The SARS-CoV-2 RNA is generally detectable in upper respiratory specimens during the acute phase of infection. The lowest concentration of SARS-CoV-2 viral copies this assay can detect is 138 copies/mL. A negative result does not preclude SARS-Cov-2 infection and should not be used as the sole basis for treatment or other patient management decisions. A negative result may occur  with  improper specimen collection/handling, submission of specimen other than nasopharyngeal swab, presence of viral mutation(s) within the areas targeted by this assay, and inadequate number of viral copies(<138 copies/mL). A negative result must be combined with clinical observations, patient history, and epidemiological information. The expected result is Negative.  Fact Sheet for Patients:  EntrepreneurPulse.com.au  Fact Sheet for Healthcare Providers:  IncredibleEmployment.be  This test is no t yet approved or cleared by the [place] FDA and  has been authorized for detection and/or diagnosis of SARS-CoV-2 by FDA under an Emergency Use Authorization (EUA). This EUA will remain  in effect (meaning this test can be used) for the duration of the COVID-19 declaration under Section 564(b)(1) of the Act, 21 U.S.C.section 360bbb-3(b)(1), unless the authorization is terminated  or revoked sooner.       Influenza A by PCR NEGATIVE NEGATIVE Final   Influenza B by PCR NEGATIVE NEGATIVE Final    Comment: (NOTE) The Xpert Xpress SARS-CoV-2/FLU/RSV plus assay is intended as an aid in the diagnosis of influenza from Nasopharyngeal swab specimens and should not be used as a sole basis for treatment. Nasal washings and aspirates are unacceptable for Xpert Xpress SARS-CoV-2/FLU/RSV testing.  Fact Sheet for Patients: EntrepreneurPulse.com.au  Fact Sheet for Healthcare Providers: IncredibleEmployment.be  This test is not yet approved or cleared by the [place] FDA and has been authorized for detection and/or diagnosis of SARS-CoV-2 by FDA under an Emergency Use Authorization (EUA). This EUA will remain in effect (meaning this test can be used) for the duration of the COVID-19 declaration under Section 564(b)(1) of the Act, 21 U.S.C. section 360bbb-3(b)(1), unless the authorization is terminated  or revoked.  Performed at [facility], [place] [address] [place], [place] [address]   Aerobic/Anaerobic Culture w Gram Stain (surgical/deep wound)     Status: None (Preliminary result)   Collection Time: 02/03/21 11:18 AM   Specimen: BILE  Result Value Ref Range Status   Specimen Description   Final    BILE Performed at [facility] [address], [place], [place] [address]    Special Requests   Final    NONE Performed at [facility], [place] [address], [place], [place] [address]    Gram Stain   Final    RARE WBC PRESENT,BOTH PMN AND MONONUCLEAR NO ORGANISMS SEEN    Culture   Final    RARE STREPTOCOCCUS PARASANGUINIS CULTURE REINCUBATED FOR BETTER GROWTH Performed at [facility], [place] [address], [place], [place] [address]    Report Status PENDING  Incomplete    Radiology Studies: US RENAL  Result Date: 02/04/2021 CLINICAL DATA:  55 year old female for follow-up of  hydronephrosis. EXAM: RENAL / URINARY TRACT ULTRASOUND COMPLETE COMPARISON:  02/02/2021 FINDINGS: Right Kidney: Renal measurements: 10.8 x 7.1 x 7.6 cm = volume: 303 mL. RIGHT hydronephrosis appears slightly improved since 02/02/2021, now moderate to severe. No renal mass is identified. Renal echogenicity is UPPER limits of normal. Left Kidney: Renal measurements: 10.9 x 6.5 x 6.9 cm = volume: 253 mL. Mild LEFT hydronephrosis is not significantly changed. No renal mass identified. Renal echogenicity is UPPER limits of normal. Bladder: RIGHT renal jet is again not visualized. A normal LEFT ureteral jet is present. No other abnormalities noted. Other: None. IMPRESSION: 1. Apparent slight improvement in RIGHT hydronephrosis, now moderate to severe. RIGHT ureteral jet again not visualized compatible with high-grade obstruction. 2. Unchanged mild LEFT hydronephrosis. 3. No other significant change. Electronically Signed   By: Margarette Canada M.D.   On: 02/04/2021 10:35    IR BILIARY DRAIN PLACEMENT WITH CHOLANGIOGRAM  Result Date: 02/03/2021 INDICATION: 55 year old woman with metastatic breast cancer and common bile duct obstruction presents to interventional radiology for biliary drain placement. EXAM: Ultrasound and fluoroscopy guided internal external biliary drain placement MEDICATIONS: Cefoxitin 2 g IV; The antibiotic was administered within an appropriate time frame prior to the initiation of the procedure. ANESTHESIA/SEDATION: Moderate (conscious) sedation was employed during this procedure. A total of Versed 3 mg and Fentanyl 100 mcg was administered intravenously by the radiology nurse. Total intra-service moderate Sedation Time: 17 minutes. The patient's level of consciousness and vital signs were monitored continuously by radiology nursing throughout the procedure under my direct supervision. FLUOROSCOPY TIME:  Fluoroscopy Time: 2 minutes 54 seconds (41 mGy). COMPLICATIONS: None immediate. PROCEDURE: Informed written consent was obtained from the patient after a thorough discussion of the procedural risks, benefits and alternatives. All questions were addressed. Maximal Sterile Barrier Technique was utilized including caps, mask, sterile gowns, sterile gloves, sterile drape, hand hygiene and skin antiseptic. A timeout was performed prior to the initiation of the procedure. Patient positioned supine on the procedure table. Epigastric skin prepped and draped in usual fashion. Ultrasound evaluation demonstrated moderate dilated left bile ducts. Using continuous ultrasound guidance, left hepatic lobe bile duct was accessed with a 21 gauge needle. Contrast administered through the needle under fluoroscopy opacified the biliary tree. 0.018 inch guidewire was advanced through the 21 gauge needle. 21 gauge needle exchanged for transitional dilator set. Transitional dilator set exchanged for Kumpe the catheter over 0.035 inch guidewire. Kumpe catheter advanced to the  duodenum. 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 Kumpe catheter exchanged for 10 French internal external biliary drain. Contrast administered through the drain under fluoroscopy confirmed appropriate positioning. Drain was secured to skin with suture and connected to bag. IMPRESSION: 1. Ultrasound fluoroscopy guided 10 French internal external biliary drain placement as above. 2. 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 Electronically Signed   By: Miachel Roux M.D.   On: 02/03/2021 15:37    Scheduled Meds:  Chlorhexidine Gluconate Cloth  6 each Topical Daily   cholecalciferol  5,000 Units Oral q morning   feeding supplement  1 Container Oral TID BM   multivitamin with minerals  1 tablet Oral Daily   propranolol  10 mg Oral BID   sodium chloride flush  5 mL Intracatheter Q8H   spironolactone  25 mg Oral BID   vitamin B-12  5,000 mcg Oral q morning   Continuous Infusions:  promethazine (PHENERGAN) injection (IM or IVPB)       LOS: 3 days  Time spent: 25 mins    Shawna Clamp, MD [facility]   If 7PM-7AM, please contact night-coverage

## 2021-02-05 NOTE — Progress Notes (Addendum)
   02/05/21 0947  Assess: MEWS Score  BP 116/86  Pulse Rate (!) 117  Assess: MEWS Score  MEWS Temp 0  MEWS Systolic 0  MEWS Pulse 2  MEWS RR 0  MEWS LOC 0  MEWS Score 2  MEWS Score Color Yellow  Treat  Pain Score 0  Notify: Provider  Provider Name/Title Dr Dwyane Dee  Date Provider Notified 02/05/21  Time Provider Notified 1020  Notification Type Call  Notification Reason Other (Comment) (yellow [place])  Provider response No new orders  Date of Provider Response 02/05/21  Time of Provider Response 1027  Document  Patient Outcome Other (Comment) (stable)  Progress note created (see row info) Yes  Assess: SIRS CRITERIA  SIRS Temperature  0  SIRS Pulse 1  SIRS Respirations  0  SIRS WBC 0  SIRS Score Sum  1

## 2021-02-05 NOTE — Progress Notes (Signed)
[name]'s pulse has remained around 100. Denies any pain. Bile from the Bilary drain obtained and sent to the lab.

## 2021-02-05 NOTE — Progress Notes (Signed)
Referring Physician(s): Hung,P  Supervising Physician: Michaelle Birks  Patient Status:  [facility] - In-pt  Chief Complaint:  Abdominal pain, metastatic breast cancer, common bile duct obstruction  Subjective: Patient doing fairly well today. She does have some mild abdominal tenderness at biliary drain site.  Denies fever, chills, nausea, vomiting.   Allergies: Patient has no known allergies.  Medications: Prior to Admission medications   Medication Sig Start Date End Date Taking? Authorizing Provider  acetaminophen (TYLENOL) 500 MG tablet Take 1,000 mg by mouth every 6 (six) hours as needed for moderate pain.   Yes [provider]  Boric Acid Vaginal 600 MG SUPP Place 600 mg vaginally daily as needed (yeast infections).   Yes [provider]  Cholecalciferol (VITAMIN D) 125 MCG (5000 UT) CAPS Take 5,000 Units by mouth every morning.   Yes [provider]  clobetasol ointment (TEMOVATE) 2.87 % Apply 1 application topically 2 (two) times daily as needed (rash/itching).   Yes [provider]  Cyanocobalamin (B-12) 5000 MCG CAPS Take 5,000 mcg by mouth every morning.   Yes [provider]  loperamide (IMODIUM) 2 MG capsule Take 2 tablets after first diarrheal bowel movement, then one tablet after each following diarrheal movement; maximum 6 tablets/ day Patient taking differently: 2-4 mg See admin instructions. Take 2 tablets  (4 mg) by mouth after first diarrheal bowel movement, then one tablet (2 mg) after each following diarrheal movement; maximum 6 tablets/ day 10/22/20  Yes Magrinat, Virgie Dad, MD  methocarbamol (ROBAXIN) 500 MG tablet Take 1 tablet (500 mg total) by mouth every 6 (six) hours as needed for muscle spasms. 12/08/20  Yes Rolm Bookbinder, MD  prochlorperazine (COMPAZINE) 10 MG tablet Take 1 tablet (10 mg total) by mouth every 6 (six) hours as needed for nausea or vomiting. 10/22/20  Yes Magrinat, Virgie Dad, MD  propranolol  (INDERAL) 10 MG tablet Take 10 mg by mouth 2 (two) times daily. 06/08/20  Yes [provider]  spironolactone (ALDACTONE) 25 MG tablet Take 25 mg by mouth 2 (two) times daily. 08/14/20  Yes [provider]  cholestyramine (QUESTRAN) 4 g packet Take 1 packet (4 g total) by mouth 2 (two) times daily for 5 days. Patient not taking: Reported on 02/01/2021 01/26/21 01/31/21  Barrie Folk, PA-C  buPROPion (WELLBUTRIN XL) 300 MG 24 hr tablet Take 1 tablet (300 mg total) by mouth every morning. 10/04/13 02/17/20  [name], MD  losartan-hydrochlorothiazide [facility]) 100-12.5 MG per tablet Take 1/2 - 1 tab daily 10/04/13 02/17/20  Zanard, Bernadene Bell, MD  telmisartan-hydrochlorothiazide (MICARDIS HCT) 80-25 MG tablet Take 1 tablet by mouth daily.  02/17/20  [provider]     Vital Signs: BP 116/86 (BP Location: Left Arm)   Pulse (!) 117   Temp 98.7 F (37.1 C) (Oral)   Resp 16   Ht '5\' 10"'  (1.778 m)   Wt 201 lb (91.2 kg)   SpO2 96%   BMI 28.84 kg/m   Physical Exam awake, alert.  Biliary drain intact, insertion site okay, output 820 cc of yellow bile.  Drain flushes okay  Imaging: CT Abdomen Pelvis W Contrast  Result Date: 02/01/2021 CLINICAL DATA:  Itching in jaundice for 7 days. Shortness of breath. Dark colored urine. Nausea. Radiation therapy yesterday. History of breast cancer with osseous metastatic disease. EXAM: CT ABDOMEN AND PELVIS WITH CONTRAST TECHNIQUE: Multidetector CT imaging of the abdomen and pelvis was performed using the standard protocol following bolus administration of  intravenous contrast. CONTRAST:  22m OMNIPAQUE IOHEXOL 350 MG/ML SOLN COMPARISON:  12/29/2020 FINDINGS: Lower chest: Right mastectomy. Stable scarring in the right lower lobe. Hepatobiliary: New substantial intrahepatic biliary dilatation observed along with gallbladder wall thickening and high density material in the gallbladder which could be from sludge, proteinaceous fluid, or  blood products. There is narrowing and substantial wall enhancement in the right and left hepatic ducts as they converge, raising the possibility of cholangitis. Ill definition of the common bile duct probably from wall thickening. No well-defined stones. Pancreas: Stranding around the pancreatic head along with new mildly dilated dorsal pancreatic duct with truncation of caliber in the pancreatic head region, appearance raises some suspicion for focal pancreatitis. I do not see a specific well-defined mass in the pancreatic head. Spleen: Unremarkable Adrenals/Urinary Tract: New abnormal hydronephrosis and proximal hydroureter extending down to a 8.5 cm dilated segment of the right distal ureter which contains high internal density; this is new compared to the 12/29/2020 exam and accordingly raises suspicion for a blood clot rather than tumor. Mildly delayed nephrogram on the right. No well-defined stone. Adrenal glands unremarkable. Urinary bladder unremarkable. Stomach/Bowel: No dilated bowel observed.  Normal appendix. Vascular/Lymphatic: Left periaortic lymph node 1.3 cm in short axis on image 42 series 2, previously 1.9 cm. Other retroperitoneal lymph nodes are likewise improved compared to previous. Reproductive: Uterus absent.  Adnexa unremarkable. Other: Stable mild retroperitoneal stranding. Musculoskeletal: Stable widespread scattered sclerotic lesions indicative of prior osseous metastatic disease. Small umbilical hernia contains adipose tissue. IMPRESSION: 1. Since 12/29/2020, there is new substantial intrahepatic biliary dilatation along with dorsal pancreatic duct dilatation. There seems to be abnormal enhancement and wall thickening in the common hepatic duct and common bile duct, along with gallbladder wall thickening and gallbladder sludge. In addition there is some stranding around the pancreatic head with truncation of the dilated dorsal pancreatic duct. Possibilities may include cholangitis  and/or focal pancreatitis involving the pancreatic head. Acute cholecystitis not excluded. A discrete pancreatic head mass is not well seen. 2. In addition, there is new substantial right hydronephrosis and delayed nephrogram on the right extending down to a tapering plug of high density material in the proximal ureter, presumably representing a blood clot in the ureter given that the kidney and ureter had a normal appearance 1 month ago. Tumor in the ureter seems unlikely in this time frame. Severe ureteritis might be a differential diagnostic consideration. 3. Mildly reduced retroperitoneal adenopathy, although the retroperitoneal stranding continues. 4. Stable widespread scattered sclerotic lesions indicative of prior metastatic disease. Electronically Signed   By: WVan ClinesM.D.   On: 02/01/2021 16:31   [place]  Result Date: 02/04/2021 CLINICAL DATA:  55year old female for follow-up of hydronephrosis. EXAM: RENAL / URINARY TRACT ULTRASOUND COMPLETE COMPARISON:  02/02/2021 FINDINGS: Right Kidney: Renal measurements: 10.8 x 7.1 x 7.6 cm = volume: 303 mL. RIGHT hydronephrosis appears slightly improved since 02/02/2021, now moderate to severe. No renal mass is identified. Renal echogenicity is UPPER limits of normal. Left Kidney: Renal measurements: 10.9 x 6.5 x 6.9 cm = volume: 253 mL. Mild LEFT hydronephrosis is not significantly changed. No renal mass identified. Renal echogenicity is UPPER limits of normal. Bladder: RIGHT renal jet is again not visualized. A normal LEFT ureteral jet is present. No other abnormalities noted. Other: None. IMPRESSION: 1. Apparent slight improvement in RIGHT hydronephrosis, now moderate to severe. RIGHT ureteral jet again not visualized compatible with high-grade obstruction. 2. Unchanged mild LEFT hydronephrosis. 3. No other significant change. Electronically  Signed   By: Margarette Canada M.D.   On: 02/04/2021 10:35   US RENAL  Result Date: 02/02/2021 CLINICAL DATA:   Hydronephrosis EXAM: RENAL / URINARY TRACT ULTRASOUND COMPLETE COMPARISON:  02/01/2021 FINDINGS: Right Kidney: Renal measurements: 6.2 x 11.2 x 5.9 cm = volume: 217 mL. Normal right renal cortical echotexture. There is severe right-sided hydronephrosis unchanged since recent CT and MRI. No evidence of nephrolithiasis. Left Kidney: Renal measurements: 5.7 x 11.3 x 4.7 cm = volume: 159 mL. Normal left renal cortical echotexture. There is mild left-sided hydronephrosis, increased since recent ultrasound. No nephrolithiasis. Bladder: A left ureteral jet is identified. The right ureteral jet is not seen. No filling defects or bladder wall thickening. Other: None. IMPRESSION: 1. Severe right-sided hydronephrosis, not appreciably changed since recent CT. There is no right ureteral jet identified within the bladder, compatible with high-grade obstruction. 2. Mild left-sided hydronephrosis, increased since prior study. 3. No evidence of nephrolithiasis. Electronically Signed   By: Randa Ngo M.D.   On: 02/02/2021 20:41   MR ABDOMEN MRCP WO CONTRAST  Result Date: 02/01/2021 CLINICAL DATA:  Jaundice. Biliary ductal dilatation and right hydronephrosis on recent CT. Metastatic breast carcinoma. EXAM: MRI ABDOMEN WITHOUT CONTRAST  (INCLUDING MRCP) TECHNIQUE: Multiplanar multisequence MR imaging of the abdomen was performed. Heavily T2-weighted images of the biliary and pancreatic ducts were obtained, and three-dimensional MRCP images were rendered by post processing. COMPARISON:  CT on 02/01/2021 FINDINGS: Lower chest: No acute findings. Hepatobiliary: No hepatic masses visualized on this unenhanced exam. Diffuse dilatation of the intrahepatic bile ducts is seen due to stricture of the common hepatic duct in the liver hilum. No evidence of cholecystitis. Pancreas: Pancreatic ductal dilatation is seen with obstruction in the pancreatic head. A solid T2 hypointense mass is seen in the pancreatic head measuring 3.6 x 3.6  cm on image 22/4, which is new since earlier CT on 12/29/2020. Adjacent soft tissue edema is seen within the porta hepatis and throughout the peripancreatic region. This rapid appearance in the past month is suggestive of focal pancreatitis, with metastatic disease or primary pancreatic carcinoma considered less likely. Spleen:  Within normal limits in size. Adrenals/Urinary tract: No evidence of renal mass or abscess. Moderate right hydronephrosis is also new since 12/29/2020, with soft tissue thickening seen involving the proximal right ureter, with adjacent retroperitoneal edema. These findings may be secondary to pancreatitis, with metastatic disease considered less likely. Stomach/Bowel: No evidence of obstruction or wall thickening. Vascular/Lymphatic: Mild retroperitoneal lymphadenopathy is seen with largest lymph node measuring 1.5 cm on image 27/4. This shows mild decrease from 2.4 cm on earlier CT of 12/29/2020. Other:  None. 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 IMPRESSION: 3.6 cm masslike area in the pancreatic head with adjacent peripancreatic edema, which also causes diffuse biliary and pancreatic ductal dilatation. Rapid appearance since prior CT approximately 1 month ago is suggestive of focal pancreatitis, although metastatic disease cannot be excluded. Moderate right hydronephrosis with soft tissue prominence involving the proximal right ureter. These findings may also be secondary to acute pancreatitis, although metastatic disease cannot be excluded. Mild decrease in retroperitoneal lymphadenopathy since earlier CT on 10/18/202222. Diffuse bone metastases. Electronically Signed   By: Marlaine Hind M.D.   On: 02/01/2021 21:54   MR 3D Recon At Scanner  Result Date: 02/01/2021 CLINICAL DATA:  Jaundice. Biliary ductal dilatation and right hydronephrosis on recent CT. Metastatic breast carcinoma. EXAM: MRI ABDOMEN WITHOUT CONTRAST  (INCLUDING MRCP)  TECHNIQUE: Multiplanar multisequence MR imaging  of the abdomen was performed. Heavily T2-weighted images of the biliary and pancreatic ducts were obtained, and three-dimensional MRCP images were rendered by post processing. COMPARISON:  CT on 02/01/2021 FINDINGS: Lower chest: No acute findings. Hepatobiliary: No hepatic masses visualized on this unenhanced exam. Diffuse dilatation of the intrahepatic bile ducts is seen due to stricture of the common hepatic duct in the liver hilum. No evidence of cholecystitis. Pancreas: Pancreatic ductal dilatation is seen with obstruction in the pancreatic head. A solid T2 hypointense mass is seen in the pancreatic head measuring 3.6 x 3.6 cm on image 22/4, which is new since earlier CT on 12/29/2020. Adjacent soft tissue edema is seen within the porta hepatis and throughout the peripancreatic region. This rapid appearance in the past month is suggestive of focal pancreatitis, with metastatic disease or primary pancreatic carcinoma considered less likely. Spleen:  Within normal limits in size. Adrenals/Urinary tract: No evidence of renal mass or abscess. Moderate right hydronephrosis is also new since 12/29/2020, with soft tissue thickening seen involving the proximal right ureter, with adjacent retroperitoneal edema. These findings may be secondary to pancreatitis, with metastatic disease considered less likely. Stomach/Bowel: No evidence of obstruction or wall thickening. Vascular/Lymphatic: Mild retroperitoneal lymphadenopathy is seen with largest lymph node measuring 1.5 cm on image 27/4. This shows mild decrease from 2.4 cm on earlier CT of 12/29/2020. Other:  None. 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 IMPRESSION: 3.6 cm masslike area in the pancreatic head with adjacent peripancreatic edema, which also causes diffuse biliary and pancreatic ductal dilatation. Rapid appearance since prior CT approximately 1 month ago is  suggestive of focal pancreatitis, although metastatic disease cannot be excluded. Moderate right hydronephrosis with soft tissue prominence involving the proximal right ureter. These findings may also be secondary to acute pancreatitis, although metastatic disease cannot be excluded. Mild decrease in retroperitoneal lymphadenopathy since earlier CT on 10/18/202222. Diffuse bone metastases. Electronically Signed   By: Marlaine Hind M.D.   On: 02/01/2021 21:54   DG ERCP BILIARY & PANCREATIC DUCTS  Result Date: 02/02/2021 CLINICAL DATA:  ERCP EXAM: ERCP TECHNIQUE: Multiple spot images obtained with the fluoroscopic device and submitted for interpretation post-procedure. FLUOROSCOPY TIME:  Refer to procedure report. COMPARISON:  None. FINDINGS: A total of 2 fluoroscopic spot images are submitted for review taken during ERCP procedure. Images demonstrate a scope overlying the upper abdomen. There then appears to be wire catheterization or attempted catheterization through the ampulla. No further images submitted. IMPRESSION: Fluoroscopic spot images during ERCP as described. Refer to procedure report for full details. These images were submitted for radiologic interpretation only. Please see the procedural report for the amount of contrast and the fluoroscopy time utilized. Electronically Signed   By: Albin Felling M.D.   On: 02/02/2021 16:50   DG C-Arm 1-60 Min-No Report  Result Date: 02/02/2021 Fluoroscopy was utilized by the requesting physician.  No radiographic interpretation.   IR BILIARY DRAIN PLACEMENT WITH CHOLANGIOGRAM  Result Date: 02/03/2021 INDICATION: 55 year old woman with metastatic breast cancer and common bile duct obstruction presents to interventional radiology for biliary drain placement. EXAM: Ultrasound and fluoroscopy guided internal external biliary drain placement MEDICATIONS: Cefoxitin 2 g IV; The antibiotic was administered within an appropriate time frame prior to the  initiation of the procedure. ANESTHESIA/SEDATION: Moderate (conscious) sedation was employed during this procedure. A total of Versed 3 mg and Fentanyl 100 mcg was administered intravenously by the radiology nurse. Total intra-service moderate Sedation Time: 17 minutes. The patient's level  of consciousness and vital signs were monitored continuously by radiology nursing throughout the procedure under my direct supervision. FLUOROSCOPY TIME:  Fluoroscopy Time: 2 minutes 54 seconds (41 mGy). COMPLICATIONS: None immediate. PROCEDURE: Informed written consent was obtained from the patient after a thorough discussion of the procedural risks, benefits and alternatives. All questions were addressed. Maximal Sterile Barrier Technique was utilized including caps, mask, sterile gowns, sterile gloves, sterile drape, hand hygiene and skin antiseptic. A timeout was performed prior to the initiation of the procedure. Patient positioned supine on the procedure table. Epigastric skin prepped and draped in usual fashion. Ultrasound evaluation demonstrated moderate dilated left bile ducts. Using continuous ultrasound guidance, left hepatic lobe bile duct was accessed with a 21 gauge needle. Contrast administered through the needle under fluoroscopy opacified the biliary tree. 0.018 inch guidewire was advanced through the 21 gauge needle. 21 gauge needle exchanged for transitional dilator set. Transitional dilator set exchanged for Kumpe the catheter over 0.035 inch guidewire. Kumpe catheter advanced to the duodenum. 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 Kumpe catheter exchanged for 10 French internal external biliary drain. Contrast administered through the drain under fluoroscopy confirmed appropriate positioning. Drain was secured to skin with suture and connected to bag. IMPRESSION: 1. Ultrasound fluoroscopy guided 10 French internal external biliary drain placement as above. 2. 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 Electronically Signed   By: Miachel Roux M.D.   On: 02/03/2021 15:37    Labs:  CBC: Recent Labs    02/02/21 0515 02/03/21 0604 02/04/21 0540 02/05/21 0444  WBC 3.8* 4.1 5.6 8.5  HGB 8.8* 9.5* 10.0* 10.2*  HCT 27.1* 28.8* 30.6* 30.9*  PLT 245 217 245 253    COAGS: Recent Labs    02/01/21 1418 02/02/21 0515  INR 1.1 1.0    BMP: Recent Labs    02/02/21 0515 02/03/21 0604 02/04/21 0540 02/05/21 0444  NA 135 135 135 134*  K 4.0 4.1 4.2 4.0  CL 106 103 102 98  CO2 '22 23 26 27  ' GLUCOSE 109* 148* 121* 106*  BUN '12 14 16 17  ' CALCIUM 9.7 10.4* 9.4 9.9  CREATININE 1.09* 0.93 1.09* 0.96  GFRNONAA >60 >60 >60 >60    LIVER FUNCTION TESTS: Recent Labs    02/02/21 0515 02/03/21 0604 02/04/21 0540 02/05/21 0444  BILITOT 9.8* 10.2* 10.9* 11.0*  AST 32 36 46* 36  ALT 29 31 36 33  ALKPHOS 323* 310* 315* 285*  PROT 6.7 7.2 7.1 7.2  ALBUMIN 2.8* 2.8* 2.6* 2.5*    Assessment and Plan: Patient with history of metastatic breast cancer, 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 Rowe Robert, PA-C 02/05/2021, 12:40 PM   I spent a total of 15 Minutes at the the patient's bedside AND on the patient's hospital floor or unit, greater than 50% of which was counseling/coordinating care for biliary drain    Patient ID: [name], female   DOB: [birth year], 55 y.o.   MRN: [id number]

## 2021-02-06 ENCOUNTER — Inpatient Hospital Stay (HOSPITAL_COMMUNITY): Payer: BC Managed Care – PPO

## 2021-02-06 DIAGNOSIS — R17 Unspecified jaundice: Secondary | ICD-10-CM | POA: Diagnosis not present

## 2021-02-06 LAB — CBC
HCT: 34.5 % — ABNORMAL LOW (ref 36.0–46.0)
Hemoglobin: 11.1 g/dL — ABNORMAL LOW (ref 12.0–15.0)
MCH: 33.1 pg (ref 26.0–34.0)
MCHC: 32.2 g/dL (ref 30.0–36.0)
MCV: 103 fL — ABNORMAL HIGH (ref 80.0–100.0)
Platelets: 314 10*3/uL (ref 150–400)
RBC: 3.35 MIL/uL — ABNORMAL LOW (ref 3.87–5.11)
RDW: 15.1 % (ref 11.5–15.5)
WBC: 11.6 10*3/uL — ABNORMAL HIGH (ref 4.0–10.5)
nRBC: 0.2 % (ref 0.0–0.2)

## 2021-02-06 LAB — MAGNESIUM: Magnesium: 2.2 mg/dL (ref 1.7–2.4)

## 2021-02-06 LAB — HEPATIC FUNCTION PANEL
ALT: 34 U/L (ref 0–44)
AST: 35 U/L (ref 15–41)
Albumin: 2.5 g/dL — ABNORMAL LOW (ref 3.5–5.0)
Alkaline Phosphatase: 289 U/L — ABNORMAL HIGH (ref 38–126)
Bilirubin, Direct: 7.6 mg/dL — ABNORMAL HIGH (ref 0.0–0.2)
Indirect Bilirubin: 4.7 mg/dL — ABNORMAL HIGH (ref 0.3–0.9)
Total Bilirubin: 12.3 mg/dL — ABNORMAL HIGH (ref 0.3–1.2)
Total Protein: 7.9 g/dL (ref 6.5–8.1)

## 2021-02-06 MED ORDER — BARIUM SULFATE 2.1 % PO SUSP
900.0000 mL | Freq: Once | ORAL | Status: AC
  Administered 2021-02-06: 900 mL via ORAL

## 2021-02-06 NOTE — Progress Notes (Signed)
PROGRESS NOTE    [name]  PJA:[id number] DOB: 07-Sep-1965 DOA: 02/01/2021  PCP: [name], MD   Brief Narrative: This 55 year old Female with PMH significant of metastatic breast cancer, Essential hypertension presented in the ED with complaints of generalized pruritus. She had follow-up with oncologist and was diagnosed with elevated total bilirubin.  Initially It was thought due to her immunotherapy Verzenio and it was stopped.  Then she started having episodes of nausea,  vomiting, change in her stool color.  Work-up showed elevated total bilirubin. CT abdomen pelvis showed concerning new right upper quadrant ductal dilation and concerning features of pancreatitis along with possible cholangitis/cholecystitis. GI team was consulted.  Patient underwent ERCP which could not cannulate biliary duct.  Patient underwent percutaneous biliary drainage by IR.  Patient's total bilirubin is trending up.  IR recommended repeat CT scan.  Assessment & Plan:   Principal Problem:   Jaundice Active Problems:   Essential hypertension, benign   Malignant neoplasm of upper-outer quadrant of right breast in female, estrogen receptor positive ([place])   Acute pancreatitis   Biliary obstruction: Acute pancreatitis versus metastatic lesion: Patient presented with abdominal pain, nausea, vomiting,  jaundice,  change in stool color and pruritus. CT showed  some evidence of obstruction/inflammation in the right upper quadrant area.  MRCP showed pancreatic head mass concerning for focal acute pancreatitis versus metastatic lesion.   Gastroenterology consulted.  Patient underwent ERCP which could not cannulate biliary duct. IR was consulted, patient underwent successful percutaneous drainage. 11/23 Continue to monitor CMP. Liver enzymes trending down.  Bilirubin trending up. Continue Zofran and Phenergan as needed for nausea and vomiting. Follow-up cultures. She still has significant output in the  biliary drain. Total bilirubin is trending up 7.3 >8.6 >11.2 >10.5 >9.8>10.2>10.9>11.0>12.3. Will obtain repeat CT scan abdomen pelvis. Will follow up IR recommendation.  Right ureteral hydronephrosis: UA negative.  Urine culture not collected. Renal ultrasound shows Severe right-sided hydronephrosis compatible with high-grade stenosis. Urology is consulted, there is no need for any stenting.   Patient denies any flank pain, serum creatinine remains normal. Repeat renal ultrasound shows improving Right sided hydronephrosis. She denies any urinary symptoms.   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 [name].  Microcytic anemia: H&H is stable.    DVT prophylaxis: SCDs Code Status: Full code. Family Communication: No family at bed side. Disposition Plan:   Status is: Inpatient  Remains inpatient appropriate because:  ERCP could not cannulate the biliary duct.  Patient underwent successful percutaneous biliary drain. Total bilirubin is trending up.  IR recommended repeat CT abdomen and pelvis.  Anticipated discharge home 11/27.  Follow-up repeat CT abdomen pelvis and IR.   Consultants:  GI IR  Procedures: ERCP ,   Percutaneous biliary drain.  Antimicrobials:   Anti-infectives (From admission, onward)    Start     Dose/Rate Route Frequency Ordered Stop   02/03/21 1130  cefOXitin (MEFOXIN) 2 g in sodium chloride 0.9 % 100 mL IVPB        2 g 200 mL/hr over 30 Minutes Intravenous  Once 02/03/21 1033 02/03/21 1124        Subjective: Patient was seen and examined at bedside.  Overnight events noted.   Patient underwent percutaneous biliary drain by IR 11/23. Patient still reports having abdominal soreness around the drain.  She denies nausea and vomiting.   She still has significant amount of drain output in her biliary drain.  Objective: Vitals:   02/05/21 1832  02/05/21 2200 02/05/21 2304 02/06/21 0552  BP: 129/90 117/74 115/79 116/85  Pulse: (!) 105  (!) 108 (!) 110  Resp: 16  18 18   Temp: 98.3 F (36.8 C)  97.9 F (36.6 C) 98.7 F (37.1 C)  TempSrc: Oral  Oral Oral  SpO2: 97%  97% 95%  Weight:      Height:        Intake/Output Summary (Last 24 hours) at 02/06/2021 1126 Last data filed at 02/06/2021 0939 Gross per 24 hour  Intake 380 ml  Output 2820 ml  Net -2440 ml   Filed Weights   02/01/21 1300 02/02/21 1347  Weight: 91.2 kg 91.2 kg    Examination:  General exam: Appears comfortable, not in any distress, deconditioned. Respiratory system: CTA bilaterally. Respiratory effort normal. RR 17 Cardiovascular system: S1-S2 heard, regular rate and rhythm, no murmur. Chest: Chemo-Port noted in the left chest. Gastrointestinal system: Abdomen is soft, mildly tender around percutaneous drain.  Mildly distended, BS+.   Biliary drain noted with clear yellow-colored fluid. Central nervous system: Alert and oriented x 3. No focal neurological deficits. Extremities: No edema, no cyanosis, no clubbing. Skin: No rashes, lesions or ulcers Psychiatry: Judgement and insight appear normal. Mood & affect appropriate.     Data Reviewed: I have personally reviewed following labs and imaging studies  CBC: Recent Labs  Lab 02/01/21 1109 02/01/21 1418 02/02/21 0515 02/03/21 0604 02/04/21 0540 02/05/21 0444 02/06/21 0604  WBC 3.2* 4.0 3.8* 4.1 5.6 8.5 11.6*  NEUTROABS 1.8 2.1 2.0  --   --   --   --   HGB 10.5* 9.8* 8.8* 9.5* 10.0* 10.2* 11.1*  HCT 31.2* 29.6* 27.1* 28.8* 30.6* 30.9* 34.5*  MCV 98.7 101.4* 102.3* 102.1* 101.7* 100.7* 103.0*  PLT 236 221 245 217 245 253 458   Basic Metabolic Panel: Recent Labs  Lab 02/01/21 1418 02/02/21 0515 02/03/21 0604 02/04/21 0540 02/05/21 0444 02/06/21 0604  NA 134* 135 135 135 134*  --   K 4.1 4.0 4.1 4.2 4.0  --   CL 104 106 103 102 98  --   CO2 21* 22 23 26 27   --   GLUCOSE 107* 109*  148* 121* 106*  --   BUN 15 12 14 16 17   --   CREATININE 1.17* 1.09* 0.93 1.09* 0.96  --   CALCIUM 10.1 9.7 10.4* 9.4 9.9  --   MG  --   --  1.9 2.0 2.1 2.2  PHOS  --   --   --  3.6  --   --    GFR: Estimated Creatinine Clearance: 82.1 mL/min (by C-G formula based on SCr of 0.96 mg/dL). Liver Function Tests: Recent Labs  Lab 02/02/21 0515 02/03/21 0604 02/04/21 0540 02/05/21 0444 02/06/21 0604  AST 32 36 46* 36 35  ALT 29 31 36 33 34  ALKPHOS 323* 310* 315* 285* 289*  BILITOT 9.8* 10.2* 10.9* 11.0* 12.3*  PROT 6.7 7.2 7.1 7.2 7.9  ALBUMIN 2.8* 2.8* 2.6* 2.5* 2.5*   Recent Labs  Lab 02/01/21 1418 02/01/21 1454  LIPASE 122*  --   AMYLASE  --  132*   No results for input(s): AMMONIA in the last 168 hours. Coagulation Profile: Recent Labs  Lab 02/01/21 1418 02/02/21 0515  INR 1.1 1.0   Cardiac Enzymes: No results for input(s): CKTOTAL, CKMB, CKMBINDEX, TROPONINI in the last 168 hours. BNP (last 3 results) No results for input(s): PROBNP in  the last 8760 hours. HbA1C: No results for input(s): HGBA1C in the last 72 hours. CBG: No results for input(s): GLUCAP in the last 168 hours. Lipid Profile: No results for input(s): CHOL, HDL, LDLCALC, TRIG, CHOLHDL, LDLDIRECT in the last 72 hours. Thyroid Function Tests: No results for input(s): TSH, T4TOTAL, FREET4, T3FREE, THYROIDAB in the last 72 hours.  Anemia Panel: No results for input(s): VITAMINB12, FOLATE, FERRITIN, TIBC, IRON, RETICCTPCT in the last 72 hours. Sepsis Labs: No results for input(s): PROCALCITON, LATICACIDVEN in the last 168 hours.  Recent Results (from the past 240 hour(s))  Resp Panel by RT-PCR (Flu A&B, Covid) Nasopharyngeal Swab     Status: None   Collection Time: 02/01/21  6:27 PM   Specimen: Nasopharyngeal Swab; Nasopharyngeal(NP) swabs in vial transport medium  Result Value Ref Range Status   SARS Coronavirus 2 by RT PCR NEGATIVE NEGATIVE Final    Comment: (NOTE) SARS-CoV-2 target nucleic  acids are NOT DETECTED.  The SARS-CoV-2 RNA is generally detectable in upper respiratory specimens during the acute phase of infection. The lowest concentration of SARS-CoV-2 viral copies this assay can detect is 138 copies/mL. A negative result does not preclude SARS-Cov-2 infection and should not be used as the sole basis for treatment or other patient management decisions. A negative result may occur with  improper specimen collection/handling, submission of specimen other than nasopharyngeal swab, presence of viral mutation(s) within the areas targeted by this assay, and inadequate number of viral copies(<138 copies/mL). A negative result must be combined with clinical observations, patient history, and epidemiological information. The expected result is Negative.  Fact Sheet for Patients:  EntrepreneurPulse.com.au  Fact Sheet for Healthcare Providers:  IncredibleEmployment.be  This test is no t yet approved or cleared by the [place] FDA and  has been authorized for detection and/or diagnosis of SARS-CoV-2 by FDA under an Emergency Use Authorization (EUA). This EUA will remain  in effect (meaning this test can be used) for the duration of the COVID-19 declaration under Section 564(b)(1) of the Act, 21 U.S.C.section 360bbb-3(b)(1), unless the authorization is terminated  or revoked sooner.       Influenza A by PCR NEGATIVE NEGATIVE Final   Influenza B by PCR NEGATIVE NEGATIVE Final    Comment: (NOTE) The Xpert Xpress SARS-CoV-2/FLU/RSV plus assay is intended as an aid in the diagnosis of influenza from Nasopharyngeal swab specimens and should not be used as a sole basis for treatment. Nasal washings and aspirates are unacceptable for Xpert Xpress SARS-CoV-2/FLU/RSV testing.  Fact Sheet for Patients: EntrepreneurPulse.com.au  Fact Sheet for Healthcare Providers: IncredibleEmployment.be  This  test is not yet approved or cleared by the [place] FDA and has been authorized for detection and/or diagnosis of SARS-CoV-2 by FDA under an Emergency Use Authorization (EUA). This EUA will remain in effect (meaning this test can be used) for the duration of the COVID-19 declaration under Section 564(b)(1) of the Act, 21 U.S.C. section 360bbb-3(b)(1), unless the authorization is terminated or revoked.  Performed at [facility], [place] [address], [place], [place] [address]   Aerobic/Anaerobic Culture w Gram Stain (surgical/deep wound)     Status: None (Preliminary result)   Collection Time: 02/03/21 11:18 AM   Specimen: BILE  Result Value Ref Range Status   Specimen Description   Final    BILE Performed at [facility] [address] [place], [place] [address]    Special Requests   Final    NONE Performed at [facility], [place] [address] [place], [place] [address]    Gram Stain   Final    RARE WBC PRESENT,BOTH PMN AND MONONUCLEAR NO ORGANISMS SEEN Performed at [facility], [place] [address], [place], [place] [address]    Culture   Final    RARE STREPTOCOCCUS PARASANGUINIS NO ANAEROBES ISOLATED; CULTURE IN PROGRESS FOR 5 DAYS    Report Status PENDING  Incomplete   Organism ID, Bacteria STREPTOCOCCUS PARASANGUINIS  Final      Susceptibility   Streptococcus parasanguinis - MIC*    PENICILLIN 0.25 INTERMEDIATE Intermediate     CEFTRIAXONE <=0.12 SENSITIVE Sensitive     LEVOFLOXACIN >=16 RESISTANT Resistant     VANCOMYCIN 0.5 SENSITIVE Sensitive     * RARE STREPTOCOCCUS PARASANGUINIS    Radiology Studies: No results found.  Scheduled Meds:  Barium Sulfate  900 mL Oral Once   Chlorhexidine Gluconate Cloth  6 each Topical Daily   cholecalciferol  5,000 Units Oral q morning   feeding supplement  1 Container Oral TID BM   multivitamin with minerals  1 tablet Oral Daily   propranolol  10 mg Oral BID   sodium  chloride flush  5 mL Intracatheter Q8H   spironolactone  25 mg Oral BID   vitamin B-12  5,000 mcg Oral q morning   Continuous Infusions:  promethazine (PHENERGAN) injection (IM or IVPB)       LOS: 4 days    Time spent: 25 mins    Shawna Clamp, MD [facility]   If 7PM-7AM, please contact night-coverage

## 2021-02-06 NOTE — Progress Notes (Signed)
Referring Physician(s): Hung,P  Supervising Physician: Dr. Pascal Lux  Patient Status:  [facility] - In-pt  Chief Complaint:  Abdominal pain, metastatic breast cancer, common bile duct obstruction  Subjective: Patient doing fairly well today. Still some mild abdominal tenderness at biliary drain site.  Denies fever, chills, nausea, vomiting.   Allergies: Patient has no known allergies.  Medications:  Current Facility-Administered Medications:    acetaminophen (TYLENOL) tablet 650 mg, 650 mg, Oral, Q6H PRN, Amin, Ankit Chirag, MD, 650 mg at 02/06/21 0616   Barium Sulfate 2.1 % SUSP 900 mL, 900 mL, Oral, Once, Magrinat, Virgie Dad, MD   Chlorhexidine Gluconate Cloth 2 % PADS 6 each, 6 each, Topical, Daily, Amin, Jeanella Flattery, MD, 6 each at 02/06/21 0936   cholecalciferol (VITAMIN D3) tablet 5,000 Units, 5,000 Units, Oral, q morning, Rise Patience, MD, 5,000 Units at 02/06/21 5631   diphenhydrAMINE (BENADRYL) injection 25 mg, 25 mg, Intravenous, Q6H PRN, Amin, Ankit Chirag, MD, 25 mg at 02/02/21 2113   feeding supplement (BOOST / RESOURCE BREEZE) liquid 1 Container, 1 Container, Oral, TID BM, Amin, Ankit Chirag, MD, 1 Container at 02/04/21 1551   guaiFENesin (ROBITUSSIN) 100 MG/5ML liquid 5 mL, 5 mL, Oral, Q4H PRN, Amin, Ankit Chirag, MD   hydrALAZINE (APRESOLINE) injection 10 mg, 10 mg, Intravenous, Q4H PRN, Amin, Ankit Chirag, MD   ipratropium-albuterol (DUONEB) 0.5-2.5 (3) MG/3ML nebulizer solution 3 mL, 3 mL, Nebulization, Q4H PRN, Amin, Ankit Chirag, MD   metoprolol tartrate (LOPRESSOR) injection 5 mg, 5 mg, Intravenous, Q4H PRN, Amin, Ankit Chirag, MD   multivitamin with minerals tablet 1 tablet, 1 tablet, Oral, Daily, Amin, Ankit Chirag, MD, 1 tablet at 02/06/21 0934   ondansetron (ZOFRAN) injection 4 mg, 4 mg, Intravenous, Q6H PRN, Shawna Clamp, MD, 4 mg at 02/03/21 0740   promethazine (PHENERGAN) 6.25 mg in sodium chloride 0.9 % 50 mL IVPB, 6.25 mg, Intravenous, Q6H PRN,  Shawna Clamp, MD   propranolol (INDERAL) tablet 10 mg, 10 mg, Oral, BID, Rise Patience, MD, 10 mg at 02/06/21 0934   sodium chloride flush (NS) 0.9 % injection 5 mL, 5 mL, Intracatheter, Q8H, Mir, Paula Libra, MD, 5 mL at 02/06/21 4970   spironolactone (ALDACTONE) tablet 25 mg, 25 mg, Oral, BID, Rise Patience, MD, 25 mg at 02/06/21 0934   traZODone (DESYREL) tablet 50 mg, 50 mg, Oral, QHS PRN, Amin, Ankit Chirag, MD   vitamin B-12 (CYANOCOBALAMIN) tablet 5,000 mcg, 5,000 mcg, Oral, q morning, Rise Patience, MD, 5,000 mcg at 02/06/21 0934    Vital Signs: BP 116/85 (BP Location: Left Arm)   Pulse (!) 110   Temp 98.7 F (37.1 C) (Oral)   Resp 18   Ht '5\' 10"'  (1.778 m)   Wt 91.2 kg   SpO2 95%   BMI 28.84 kg/m   Physical Exam awake, alert.   Biliary drain intact, insertion site okay, clean dry About 465m think yellow bile in bag at present time. Pt reports RN also emptied this am.  Imaging: [place]  Result Date: 02/04/2021 CLINICAL DATA:  55year old female for follow-up of hydronephrosis. EXAM: RENAL / URINARY TRACT ULTRASOUND COMPLETE COMPARISON:  02/02/2021 FINDINGS: Right Kidney: Renal measurements: 10.8 x 7.1 x 7.6 cm = volume: 303 mL. RIGHT hydronephrosis appears slightly improved since 02/02/2021, now moderate to severe. No renal mass is identified. Renal echogenicity is UPPER limits of normal. Left Kidney: Renal measurements: 10.9 x 6.5 x 6.9 cm = volume: 253 mL. Mild LEFT hydronephrosis is not significantly  changed. No renal mass identified. Renal echogenicity is UPPER limits of normal. Bladder: RIGHT renal jet is again not visualized. A normal LEFT ureteral jet is present. No other abnormalities noted. Other: None. IMPRESSION: 1. Apparent slight improvement in RIGHT hydronephrosis, now moderate to severe. RIGHT ureteral jet again not visualized compatible with high-grade obstruction. 2. Unchanged mild LEFT hydronephrosis. 3. No other significant change.  Electronically Signed   By: Margarette Canada M.D.   On: 02/04/2021 10:35   US RENAL  Result Date: 02/02/2021 CLINICAL DATA:  Hydronephrosis EXAM: RENAL / URINARY TRACT ULTRASOUND COMPLETE COMPARISON:  02/01/2021 FINDINGS: Right Kidney: Renal measurements: 6.2 x 11.2 x 5.9 cm = volume: 217 mL. Normal right renal cortical echotexture. There is severe right-sided hydronephrosis unchanged since recent CT and MRI. No evidence of nephrolithiasis. Left Kidney: Renal measurements: 5.7 x 11.3 x 4.7 cm = volume: 159 mL. Normal left renal cortical echotexture. There is mild left-sided hydronephrosis, increased since recent ultrasound. No nephrolithiasis. Bladder: A left ureteral jet is identified. The right ureteral jet is not seen. No filling defects or bladder wall thickening. Other: None. IMPRESSION: 1. Severe right-sided hydronephrosis, not appreciably changed since recent CT. There is no right ureteral jet identified within the bladder, compatible with high-grade obstruction. 2. Mild left-sided hydronephrosis, increased since prior study. 3. No evidence of nephrolithiasis. Electronically Signed   By: Randa Ngo M.D.   On: 02/02/2021 20:41   DG ERCP BILIARY & PANCREATIC DUCTS  Result Date: 02/02/2021 CLINICAL DATA:  ERCP EXAM: ERCP TECHNIQUE: Multiple spot images obtained with the fluoroscopic device and submitted for interpretation post-procedure. FLUOROSCOPY TIME:  Refer to procedure report. COMPARISON:  None. FINDINGS: A total of 2 fluoroscopic spot images are submitted for review taken during ERCP procedure. Images demonstrate a scope overlying the upper abdomen. There then appears to be wire catheterization or attempted catheterization through the ampulla. No further images submitted. IMPRESSION: Fluoroscopic spot images during ERCP as described. Refer to procedure report for full details. These images were submitted for radiologic interpretation only. Please see the procedural report for the amount of  contrast and the fluoroscopy time utilized. Electronically Signed   By: Albin Felling M.D.   On: 02/02/2021 16:50   DG C-Arm 1-60 Min-No Report  Result Date: 02/02/2021 Fluoroscopy was utilized by the requesting physician.  No radiographic interpretation.   IR BILIARY DRAIN PLACEMENT WITH CHOLANGIOGRAM  Result Date: 02/03/2021 INDICATION: 55 year old woman with metastatic breast cancer and common bile duct obstruction presents to interventional radiology for biliary drain placement. EXAM: Ultrasound and fluoroscopy guided internal external biliary drain placement MEDICATIONS: Cefoxitin 2 g IV; The antibiotic was administered within an appropriate time frame prior to the initiation of the procedure. ANESTHESIA/SEDATION: Moderate (conscious) sedation was employed during this procedure. A total of Versed 3 mg and Fentanyl 100 mcg was administered intravenously by the radiology nurse. Total intra-service moderate Sedation Time: 17 minutes. The patient's level of consciousness and vital signs were monitored continuously by radiology nursing throughout the procedure under my direct supervision. FLUOROSCOPY TIME:  Fluoroscopy Time: 2 minutes 54 seconds (41 mGy). COMPLICATIONS: None immediate. PROCEDURE: Informed written consent was obtained from the patient after a thorough discussion of the procedural risks, benefits and alternatives. All questions were addressed. Maximal Sterile Barrier Technique was utilized including caps, mask, sterile gowns, sterile gloves, sterile drape, hand hygiene and skin antiseptic. A timeout was performed prior to the initiation of the procedure. Patient positioned supine on the procedure table. Epigastric skin prepped and draped in usual  fashion. Ultrasound evaluation demonstrated moderate dilated left bile ducts. Using continuous ultrasound guidance, left hepatic lobe bile duct was accessed with a 21 gauge needle. Contrast administered through the needle under fluoroscopy  opacified the biliary tree. 0.018 inch guidewire was advanced through the 21 gauge needle. 21 gauge needle exchanged for transitional dilator set. Transitional dilator set exchanged for Kumpe the catheter over 0.035 inch guidewire. Kumpe catheter advanced to the duodenum. 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 Kumpe catheter exchanged for 10 French internal external biliary drain. Contrast administered through the drain under fluoroscopy confirmed appropriate positioning. Drain was secured to skin with suture and connected to bag. IMPRESSION: 1. Ultrasound fluoroscopy guided 10 French internal external biliary drain placement as above. 2. 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 Electronically Signed   By: Miachel Roux M.D.   On: 02/03/2021 15:37    Labs:  CBC: Recent Labs    02/03/21 0604 02/04/21 0540 02/05/21 0444 02/06/21 0604  WBC 4.1 5.6 8.5 11.6*  HGB 9.5* 10.0* 10.2* 11.1*  HCT 28.8* 30.6* 30.9* 34.5*  PLT 217 245 253 314     COAGS: Recent Labs    02/01/21 1418 02/02/21 0515  INR 1.1 1.0     BMP: Recent Labs    02/02/21 0515 02/03/21 0604 02/04/21 0540 02/05/21 0444  NA 135 135 135 134*  K 4.0 4.1 4.2 4.0  CL 106 103 102 98  CO2 '22 23 26 27  ' GLUCOSE 109* 148* 121* 106*  BUN '12 14 16 17  ' CALCIUM 9.7 10.4* 9.4 9.9  CREATININE 1.09* 0.93 1.09* 0.96  GFRNONAA >60 >60 >60 >60     LIVER FUNCTION TESTS: Recent Labs    02/03/21 0604 02/04/21 0540 02/05/21 0444 02/06/21 0604  BILITOT 10.2* 10.9* 11.0* 12.3*  AST 36 46* 36 35  ALT 31 36 33 34  ALKPHOS 310* 315* 285* 289*  PROT 7.2 7.1 7.2 7.9  ALBUMIN 2.8* 2.6* 2.5* 2.5*     Assessment and Plan: Patient with history of metastatic breast cancer, hyperbilirubinemia with 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  with previously failed ERCP, bilat hydronephrosis, R>L; status post PTC with internal and external biliary drain placement on 11/23 Bili still trending up slightly though  plenty bilious output Cont current tx/drain; monitor labs closely; send bile for cytology; if LFT's do not improve consider f/u CT     Electronically Signed: Ascencion Dike, PA-C 02/06/2021, 11:31 AM   I spent a total of 15 Minutes at the the patient's bedside AND on the patient's hospital floor or unit, greater than 50% of which was counseling/coordinating care for biliary drain    Patient ID: [name], female   DOB: 19-Jan-1966, 55 y.o.   MRN: [id number]

## 2021-02-07 DIAGNOSIS — R17 Unspecified jaundice: Secondary | ICD-10-CM | POA: Diagnosis not present

## 2021-02-07 LAB — COMPREHENSIVE METABOLIC PANEL
ALT: 30 U/L (ref 0–44)
AST: 34 U/L (ref 15–41)
Albumin: 2.4 g/dL — ABNORMAL LOW (ref 3.5–5.0)
Alkaline Phosphatase: 248 U/L — ABNORMAL HIGH (ref 38–126)
Anion gap: 9 (ref 5–15)
BUN: 26 mg/dL — ABNORMAL HIGH (ref 6–20)
CO2: 28 mmol/L (ref 22–32)
Calcium: 9.9 mg/dL (ref 8.9–10.3)
Chloride: 93 mmol/L — ABNORMAL LOW (ref 98–111)
Creatinine, Ser: 1.25 mg/dL — ABNORMAL HIGH (ref 0.44–1.00)
GFR, Estimated: 51 mL/min — ABNORMAL LOW (ref >60)
Glucose, Bld: 118 mg/dL — ABNORMAL HIGH (ref 70–99)
Potassium: 4 mmol/L (ref 3.5–5.1)
Sodium: 130 mmol/L — ABNORMAL LOW (ref 135–145)
Total Bilirubin: 9.9 mg/dL — ABNORMAL HIGH (ref 0.3–1.2)
Total Protein: 7.5 g/dL (ref 6.5–8.1)

## 2021-02-07 LAB — CBC
HCT: 32.2 % — ABNORMAL LOW (ref 36.0–46.0)
Hemoglobin: 10.5 g/dL — ABNORMAL LOW (ref 12.0–15.0)
MCH: 33 pg (ref 26.0–34.0)
MCHC: 32.6 g/dL (ref 30.0–36.0)
MCV: 101.3 fL — ABNORMAL HIGH (ref 80.0–100.0)
Platelets: 336 10*3/uL (ref 150–400)
RBC: 3.18 MIL/uL — ABNORMAL LOW (ref 3.87–5.11)
RDW: 14.3 % (ref 11.5–15.5)
WBC: 12.3 10*3/uL — ABNORMAL HIGH (ref 4.0–10.5)
nRBC: 0.2 % (ref 0.0–0.2)

## 2021-02-07 LAB — MAGNESIUM: Magnesium: 2.2 mg/dL (ref 1.7–2.4)

## 2021-02-07 LAB — PHOSPHORUS: Phosphorus: 3.8 mg/dL (ref 2.5–4.6)

## 2021-02-07 MED ORDER — SODIUM CHLORIDE 0.9 % IV BOLUS
500.0000 mL | Freq: Once | INTRAVENOUS | Status: AC
  Administered 2021-02-07: 09:00:00 500 mL via INTRAVENOUS

## 2021-02-07 MED ORDER — SODIUM CHLORIDE 0.9 % IV SOLN
INTRAVENOUS | Status: AC
Start: 2021-02-07 — End: 2021-02-08

## 2021-02-07 MED ORDER — ALUM & MAG HYDROXIDE-SIMETH 200-200-20 MG/5ML PO SUSP
15.0000 mL | Freq: Four times a day (QID) | ORAL | Status: DC | PRN
  Administered 2021-02-07 – 2021-02-13 (×4): 15 mL via ORAL
  Filled 2021-02-07 (×5): qty 30

## 2021-02-07 NOTE — Progress Notes (Signed)
PROGRESS NOTE    [name]  AXK:[id number] DOB: [birth year] DOA: 02/01/2021  PCP: [name], MD   Brief Narrative: This 55 year old Female with PMH significant of metastatic breast cancer, Essential hypertension presented in the ED with complaints of generalized pruritus. She had follow-up with oncologist and was diagnosed with elevated total bilirubin.  Initially It was thought due to her immunotherapy Verzenio and it was stopped.  Then she started having episodes of nausea,  vomiting, change in her stool color.  Work-up showed elevated total bilirubin. CT abdomen pelvis showed concerning new right upper quadrant ductal dilation and concerning features of pancreatitis along with possible cholangitis/cholecystitis. GI team was consulted.  Patient underwent ERCP which could not cannulate biliary duct.  Patient underwent percutaneous biliary drainage by IR.  Patient's total bilirubin is trending up.  IR recommended repeat CT scan.  Assessment & Plan:   Principal Problem:   Jaundice Active Problems:   Essential hypertension, benign   Malignant neoplasm of upper-outer quadrant of right breast in female, estrogen receptor positive ([place])   Acute pancreatitis   Biliary obstruction: Acute pancreatitis versus metastatic lesion: Patient presented with abdominal pain, nausea, vomiting,  jaundice,  change in stool color and pruritus. CT showed  some evidence of obstruction/inflammation in the right upper quadrant area.  MRCP showed pancreatic head mass concerning for focal acute pancreatitis versus metastatic lesion.   Gastroenterology consulted.  Patient underwent ERCP which could not cannulate biliary duct. IR was consulted, patient underwent successful percutaneous drainage. 11/23 Continue to monitor CMP. Liver enzymes trending down.  Bilirubin trending up. Continue Zofran and Phenergan as needed for nausea and vomiting. Follow-up cultures. She still has significant output in the  biliary drain. Total bilirubin is trending up 7.3 >8.6 >11.2 >10.5 >9.8>10.2>10.9>11.0>12.3. Repeat CT scan abdomen pelvis showed worsening pancreatitis. Now Bilirubin trending down 9.9 Will follow up IR recommendation.  Right ureteral hydronephrosis: UA negative.  Urine culture not collected. Renal ultrasound shows Severe right-sided hydronephrosis compatible with high-grade stenosis. Urology is consulted, there is no need for any stenting.   Patient denies any flank pain, serum creatinine remains normal. Repeat renal ultrasound shows improving Right sided hydronephrosis. She denies any urinary symptoms.   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 [name].  Microcytic anemia: H&H is stable.   AKI: Baseline creatinine normal, serum creatinine up to 1.25 Avoid nephrotoxic medications, continue IV gentle hydration.  DVT prophylaxis: SCDs Code Status: Full code. Family Communication: No family at bed side. Disposition Plan:   Status is: Inpatient  Remains inpatient appropriate because:  ERCP could not cannulate the biliary duct.  Patient underwent successful percutaneous biliary drain. Total bilirubin is trending up.  Repeat CT abdomen pelvis showed worsening pancreatitis.  Anticipated discharge home 11/28.  Follow-up repeat CT abdomen pelvis and IR.   Consultants:  GI IR  Procedures: ERCP ,   Percutaneous biliary drain.  Antimicrobials:   Anti-infectives (From admission, onward)    Start     Dose/Rate Route Frequency Ordered Stop   02/03/21 1130  cefOXitin (MEFOXIN) 2 g in sodium chloride 0.9 % 100 mL IVPB        2 g 200 mL/hr over 30 Minutes Intravenous  Once 02/03/21 1033 02/03/21 1124        Subjective: Patient was seen and examined at bedside.  Overnight events noted.   Patient underwent percutaneous biliary drain by IR 11/23. Patient still reports having pain  around  the biliary drain.  She denies nausea and vomiting.   She still has significant amount of drain output in her biliary drain.   Objective: Vitals:   02/06/21 2133 02/06/21 2322 02/07/21 0532 02/07/21 1034  BP:   104/76 113/80  Pulse:   100 99  Resp: 19 19 18    Temp:   97.7 F (36.5 C)   TempSrc:   Oral   SpO2:   97%   Weight:      Height:        Intake/Output Summary (Last 24 hours) at 02/07/2021 1249 Last data filed at 02/07/2021 0853 Gross per 24 hour  Intake 20 ml  Output 1100 ml  Net -1080 ml   Filed Weights   02/01/21 1300 02/02/21 1347  Weight: 91.2 kg 91.2 kg    Examination:  General exam: Appears comfortable, deconditioned, NAD. Respiratory system: CTA bilaterally. Respiratory effort normal. RR 16 Cardiovascular system: S1-S2 heard, regular rate and rhythm, no murmur. Chest: Chemo-Port noted in the left chest. Gastrointestinal system: Abdomen is soft, mildly tender around the drain area.  Mildly distended, BS+.   Biliary drain noted with clear yellow-colored fluid. Central nervous system: Alert and oriented x 3. No focal neurological deficits. Extremities: No edema, no cyanosis, no clubbing. Skin: No rashes, lesions or ulcers Psychiatry: Judgement and insight appear normal. Mood & affect appropriate.     Data Reviewed: I have personally reviewed following labs and imaging studies  CBC: Recent Labs  Lab 02/01/21 1109 02/01/21 1418 02/02/21 0515 02/03/21 0604 02/04/21 0540 02/05/21 0444 02/06/21 0604 02/07/21 0521  WBC 3.2* 4.0 3.8* 4.1 5.6 8.5 11.6* 12.3*  NEUTROABS 1.8 2.1 2.0  --   --   --   --   --   HGB 10.5* 9.8* 8.8* 9.5* 10.0* 10.2* 11.1* 10.5*  HCT 31.2* 29.6* 27.1* 28.8* 30.6* 30.9* 34.5* 32.2*  MCV 98.7 101.4* 102.3* 102.1* 101.7* 100.7* 103.0* 101.3*  PLT 236 221 245 217 245 253 314 621   Basic Metabolic Panel: Recent Labs  Lab 02/02/21 0515 02/03/21 0604 02/04/21 0540 02/05/21 0444 02/06/21 0604 02/07/21 0521  NA 135  135 135 134*  --  130*  K 4.0 4.1 4.2 4.0  --  4.0  CL 106 103 102 98  --  93*  CO2 22 23 26 27   --  28  GLUCOSE 109* 148* 121* 106*  --  118*  BUN 12 14 16 17   --  26*  CREATININE 1.09* 0.93 1.09* 0.96  --  1.25*  CALCIUM 9.7 10.4* 9.4 9.9  --  9.9  MG  --  1.9 2.0 2.1 2.2 2.2  PHOS  --   --  3.6  --   --  3.8   GFR: Estimated Creatinine Clearance: 63 mL/min (A) (by C-G formula based on SCr of 1.25 mg/dL (H)). Liver Function Tests: Recent Labs  Lab 02/03/21 0604 02/04/21 0540 02/05/21 0444 02/06/21 0604 02/07/21 0521  AST 36 46* 36 35 34  ALT 31 36 33 34 30  ALKPHOS 310* 315* 285* 289* 248*  BILITOT 10.2* 10.9* 11.0* 12.3* 9.9*  PROT 7.2 7.1 7.2 7.9 7.5  ALBUMIN 2.8* 2.6* 2.5* 2.5* 2.4*   Recent Labs  Lab 02/01/21 1418 02/01/21 1454  LIPASE 122*  --   AMYLASE  --  132*   No results for input(s): AMMONIA in the last 168 hours. Coagulation Profile: Recent Labs  Lab 02/01/21 1418 02/02/21 0515  INR 1.1 1.0   Cardiac Enzymes: No results  for input(s): CKTOTAL, CKMB, CKMBINDEX, TROPONINI in the last 168 hours. BNP (last 3 results) No results for input(s): PROBNP in the last 8760 hours. HbA1C: No results for input(s): HGBA1C in the last 72 hours. CBG: No results for input(s): GLUCAP in the last 168 hours. Lipid Profile: No results for input(s): CHOL, HDL, LDLCALC, TRIG, CHOLHDL, LDLDIRECT in the last 72 hours. Thyroid Function Tests: No results for input(s): TSH, T4TOTAL, FREET4, T3FREE, THYROIDAB in the last 72 hours.  Anemia Panel: No results for input(s): VITAMINB12, FOLATE, FERRITIN, TIBC, IRON, RETICCTPCT in the last 72 hours. Sepsis Labs: No results for input(s): PROCALCITON, LATICACIDVEN in the last 168 hours.  Recent Results (from the past 240 hour(s))  Resp Panel by RT-PCR (Flu A&B, Covid) Nasopharyngeal Swab     Status: None   Collection Time: 02/01/21  6:27 PM   Specimen: Nasopharyngeal Swab; Nasopharyngeal(NP) swabs in vial transport medium   Result Value Ref Range Status   SARS Coronavirus 2 by RT PCR NEGATIVE NEGATIVE Final    Comment: (NOTE) SARS-CoV-2 target nucleic acids are NOT DETECTED.  The SARS-CoV-2 RNA is generally detectable in upper respiratory specimens during the acute phase of infection. The lowest concentration of SARS-CoV-2 viral copies this assay can detect is 138 copies/mL. A negative result does not preclude SARS-Cov-2 infection and should not be used as the sole basis for treatment or other patient management decisions. A negative result may occur with  improper specimen collection/handling, submission of specimen other than nasopharyngeal swab, presence of viral mutation(s) within the areas targeted by this assay, and inadequate number of viral copies(<138 copies/mL). A negative result must be combined with clinical observations, patient history, and epidemiological information. The expected result is Negative.  Fact Sheet for Patients:  EntrepreneurPulse.com.au  Fact Sheet for Healthcare Providers:  IncredibleEmployment.be  This test is no t yet approved or cleared by the [place] FDA and  has been authorized for detection and/or diagnosis of SARS-CoV-2 by FDA under an Emergency Use Authorization (EUA). This EUA will remain  in effect (meaning this test can be used) for the duration of the COVID-19 declaration under Section 564(b)(1) of the Act, 21 U.S.C.section 360bbb-3(b)(1), unless the authorization is terminated  or revoked sooner.       Influenza A by PCR NEGATIVE NEGATIVE Final   Influenza B by PCR NEGATIVE NEGATIVE Final    Comment: (NOTE) The Xpert Xpress SARS-CoV-2/FLU/RSV plus assay is intended as an aid in the diagnosis of influenza from Nasopharyngeal swab specimens and should not be used as a sole basis for treatment. Nasal washings and aspirates are unacceptable for Xpert Xpress SARS-CoV-2/FLU/RSV testing.  Fact Sheet for  Patients: EntrepreneurPulse.com.au  Fact Sheet for Healthcare Providers: IncredibleEmployment.be  This test is not yet approved or cleared by the [place] FDA and has been authorized for detection and/or diagnosis of SARS-CoV-2 by FDA under an Emergency Use Authorization (EUA). This EUA will remain in effect (meaning this test can be used) for the duration of the COVID-19 declaration under Section 564(b)(1) of the Act, 21 U.S.C. section 360bbb-3(b)(1), unless the authorization is terminated or revoked.  Performed at [facility], [place] [address], [place], [place] [address]   Aerobic/Anaerobic Culture w Gram Stain (surgical/deep wound)     Status: None (Preliminary result)   Collection Time: 02/03/21 11:18 AM   Specimen: BILE  Result Value Ref Range Status   Specimen Description   Final    BILE Performed at [facility]  [address] [place], [place] [address]    Special Requests   Final    NONE Performed at [facility], [place] [address], [place], [place] [address]    Gram Stain   Final    RARE WBC PRESENT,BOTH PMN AND MONONUCLEAR NO ORGANISMS SEEN Performed at [facility], [place] [address], [place],  [address]    Culture   Final    RARE STREPTOCOCCUS PARASANGUINIS NO ANAEROBES ISOLATED; CULTURE IN PROGRESS FOR 5 DAYS    Report Status PENDING  Incomplete   Organism ID, Bacteria STREPTOCOCCUS PARASANGUINIS  Final      Susceptibility   Streptococcus parasanguinis - MIC*    PENICILLIN 0.25 INTERMEDIATE Intermediate     CEFTRIAXONE <=0.12 SENSITIVE Sensitive     LEVOFLOXACIN >=16 RESISTANT Resistant     VANCOMYCIN 0.5 SENSITIVE Sensitive     * RARE STREPTOCOCCUS PARASANGUINIS    Radiology Studies: CT ABDOMEN PELVIS WO CONTRAST  Result Date: 02/06/2021 CLINICAL DATA:  History of metastatic breast cancer with jaundice, post placement of percutaneous biliary  drainage catheter on 02/03/2021 EXAM: CT ABDOMEN AND PELVIS WITHOUT CONTRAST TECHNIQUE: Multidetector CT imaging of the abdomen and pelvis was performed following the standard protocol without IV contrast. COMPARISON:  CT abdomen and pelvis-02/01/2021; 12/29/2020 MRCP-02/01/2021 image guided placement of percutaneous biliary drainage catheter-02/03/2021 FINDINGS: Lower chest: Limited visualization of the lower thorax demonstrates bibasilar subsegmental atelectasis, left greater than right. No pleural effusion. The tip of a port a catheter terminates at the level of the superior cavoatrial junction. Normal heart size. Trace amount of pericardial fluid, presumably physiologic. There is diffuse decreased attenuation of the intra cardiac blood pool suggestive of anemia. Coronary artery calcifications. Hepatobiliary: Interval placement of left hepatic lobe approach internal/external biliary drainage catheter with radiopaque side marker located at the level of the biliary hilum and distal end appropriately positioned within the descending portion of the duodenum. Evaluation for residual intrahepatic biliary ductal dilatation is degraded secondary to the lack of intravenous contrast. The gallbladder is filled with radiopaque material, likely contrast from placement of the internal/external biliary drainage catheter. No definitive gallbladder wall thickening or pericholecystic stranding on this noncontrast examination. No ascites. Pancreas: Questioned pancreatic head mass on preceding MRCP is suboptimally evaluated on this noncontrast examination (image 38, series 2), however there has been development of perihepatic ascites seen both at the level of the gastrohepatic ligament (image 29, series 2) as well as within the adjacent ventral aspect of the abdominal mesentery (image 43, series 2). While incompletely evaluated this noncontrast examination there is no definitive organization of this fluid to suggest the presence of  a pseudo cyst. Spleen: Normal noncontrast appearance of the spleen. Adrenals/Urinary Tract: Redemonstrated moderate right-sided ureterectasis and pelvicaliectasis with abrupt tapering at the mid aspect of the right ureter (image 62, series 2). Again, the etiology of this obstruction is not depicted on this examination, specifically, no radiopaque renal stones. Normal noncontrast appearance of the left kidney. No evidence of left-sided nephrolithiasis or urinary obstruction. Normal noncontrast appearance of the bilateral adrenal glands. Normal noncontrast appearance of the urinary bladder given underdistention. Stomach/Bowel: Ingested enteric contrast extends to the level of the proximal descending colon. Moderate colonic stool burden without evidence of enteric obstruction. Normal noncontrast appearance of the terminal ileum. The appendix is not visualized, however there is no pericecal inflammatory change on this noncontrast examination. No significant hiatal hernia. No pneumoperitoneum, pneumatosis or portal venous gas. Vascular/Lymphatic: Scattered atherosclerotic plaque within a normal caliber abdominal aorta. Redemonstrated scattered retroperitoneal  adenopathy with index left-sided periaortic lymph node measuring 1.3 cm in greatest short axis diameter (image 44, series 2). Reproductive: Post hysterectomy. No discrete adnexal lesions. Interval development of small amount of free fluid in the pelvic cul-de-sac. Other: There is a minimal amount of subcutaneous edema about the midline of the low back. Musculoskeletal: Redemonstrated rather extensive osseous metastatic disease with multiple predominantly sclerotic lesions seen throughout the thoracic and lumbar spine, the pelvis, the inferior tip of the right scapula (image 12, series 2) and several right-sided ribs. No definitive evidence of impending pathologic fracture. Regional soft tissues appear normal. IMPRESSION: 1. Stable sequela of internal/external  left-sided biliary drainage catheter placement. The drainage catheter is appropriately positioned with radiopaque marker at the level of the biliary hilum and distal coil located within the duodenum. Evaluation for residual intrahepatic biliary ductal dilatation is suboptimally evaluated on this noncontrast examination though both the right and left biliary systems were noted to communicate at the time of biliary drainage catheter placement on 02/03/2021. 2. Worsening peripancreatic stranding/fluid worrisome for progressive pancreatitis, incompletely evaluated on this noncontrast examination. Note, questioned pancreatic head mass on recent MRCP is suboptimally evaluated on this noncontrast abdominal CT. 3. Redemonstrated right-sided pelvicaliectasis and ureterectasis to the level of the mid aspect of the right ureter, the etiology of which is not depicted on this examination. Specifically, no radiopaque renal stones. 4. Redemonstrated extensive osseous metastatic disease and mild retroperitoneal lymphadenopathy. 5.  Aortic Atherosclerosis (ICD10-I70.0). Electronically Signed   By: Sandi Mariscal M.D.   On: 02/06/2021 15:27    Scheduled Meds:  Chlorhexidine Gluconate Cloth  6 each Topical Daily   cholecalciferol  5,000 Units Oral q morning   feeding supplement  1 Container Oral TID BM   multivitamin with minerals  1 tablet Oral Daily   propranolol  10 mg Oral BID   sodium chloride flush  5 mL Intracatheter Q8H   spironolactone  25 mg Oral BID   vitamin B-12  5,000 mcg Oral q morning   Continuous Infusions:  promethazine (PHENERGAN) injection (IM or IVPB)       LOS: 5 days    Time spent: 25 mins    Shawna Clamp, MD [facility]   If 7PM-7AM, please contact night-coverage

## 2021-02-08 ENCOUNTER — Other Ambulatory Visit: Payer: Self-pay | Admitting: Oncology

## 2021-02-08 ENCOUNTER — Inpatient Hospital Stay: Payer: BC Managed Care – PPO

## 2021-02-08 ENCOUNTER — Ambulatory Visit
Admission: RE | Admit: 2021-02-08 | Discharge: 2021-02-08 | Disposition: A | Discharge status: Home / Self Care | Payer: BC Managed Care – PPO | Source: Ambulatory Visit | Attending: Radiation Oncology | Admitting: Radiation Oncology

## 2021-02-08 DIAGNOSIS — R17 Unspecified jaundice: Secondary | ICD-10-CM | POA: Diagnosis not present

## 2021-02-08 LAB — COMPREHENSIVE METABOLIC PANEL
ALT: 27 U/L (ref 0–44)
AST: 29 U/L (ref 15–41)
Albumin: 1.9 g/dL — ABNORMAL LOW (ref 3.5–5.0)
Alkaline Phosphatase: 179 U/L — ABNORMAL HIGH (ref 38–126)
Anion gap: 8 (ref 5–15)
BUN: 20 mg/dL (ref 6–20)
CO2: 21 mmol/L — ABNORMAL LOW (ref 22–32)
Calcium: 7.7 mg/dL — ABNORMAL LOW (ref 8.9–10.3)
Chloride: 105 mmol/L (ref 98–111)
Creatinine, Ser: 0.93 mg/dL (ref 0.44–1.00)
GFR, Estimated: >60 mL/min (ref >60)
Glucose, Bld: 106 mg/dL — ABNORMAL HIGH (ref 70–99)
Potassium: 3.1 mmol/L — ABNORMAL LOW (ref 3.5–5.1)
Sodium: 134 mmol/L — ABNORMAL LOW (ref 135–145)
Total Bilirubin: 6 mg/dL — ABNORMAL HIGH (ref 0.3–1.2)
Total Protein: 6.1 g/dL — ABNORMAL LOW (ref 6.5–8.1)

## 2021-02-08 LAB — AEROBIC/ANAEROBIC CULTURE W GRAM STAIN (SURGICAL/DEEP WOUND)

## 2021-02-08 LAB — CBC
HCT: 33 % — ABNORMAL LOW (ref 36.0–46.0)
Hemoglobin: 10.6 g/dL — ABNORMAL LOW (ref 12.0–15.0)
MCH: 32.4 pg (ref 26.0–34.0)
MCHC: 32.1 g/dL (ref 30.0–36.0)
MCV: 100.9 fL — ABNORMAL HIGH (ref 80.0–100.0)
Platelets: 387 10*3/uL (ref 150–400)
RBC: 3.27 MIL/uL — ABNORMAL LOW (ref 3.87–5.11)
RDW: 13.9 % (ref 11.5–15.5)
WBC: 13.3 10*3/uL — ABNORMAL HIGH (ref 4.0–10.5)
nRBC: 0.2 % (ref 0.0–0.2)

## 2021-02-08 LAB — MAGNESIUM: Magnesium: 1.9 mg/dL (ref 1.7–2.4)

## 2021-02-08 MED ORDER — DOCUSATE SODIUM 50 MG PO CAPS
50.0000 mg | ORAL_CAPSULE | Freq: Once | ORAL | Status: AC
  Administered 2021-02-08: 22:00:00 50 mg via ORAL
  Filled 2021-02-08: qty 1

## 2021-02-08 MED ORDER — POTASSIUM CHLORIDE 20 MEQ PO PACK
40.0000 meq | PACK | Freq: Once | ORAL | Status: DC
  Filled 2021-02-08: qty 2

## 2021-02-08 NOTE — Progress Notes (Signed)
[name]   DOB:10/24/1965   SA#:[id number]   NAT#:[id number]  Subjective: [name] tells me she is feeling a little better; less itchy.  She is still on clear liquids.  She has not had a bowel movement in many days.  She is passing gas however.  She is not walking in the halls because she feels weak.  She does not think she can get back to work before January and requested I write a note for her which I was glad to do.   Objective: African-American woman examined in bed Vitals:   02/07/21 2036 02/08/21 0542  BP: 114/73 110/70  Pulse: 99 97  Resp: 16 18  Temp: 98 F (36.7 C) 98.1 F (36.7 C)  SpO2: 98% 96%    Body mass index is 28.84 kg/m.  Intake/Output Summary (Last 24 hours) at 02/08/2021 1047 Last data filed at 02/08/2021 1026 Gross per 24 hour  Intake 1254.42 ml  Output 2475 ml  Net -1220.58 ml    Biliary drain in place with clear brown liquid in bag  CBG (last 3)  No results for input(s): GLUCAP in the last 72 hours.   Labs:  Lab Results  Component Value Date   WBC 13.3 (H) 02/08/2021   HGB 10.6 (L) 02/08/2021   HCT 33.0 (L) 02/08/2021   MCV 100.9 (H) 02/08/2021   PLT 387 02/08/2021   NEUTROABS 2.0 02/02/2021    '@LASTCHEMISTRY' @  Urine Studies No results for input(s): UHGB, CRYS in the last 72 hours.  Invalid input(s): UACOL, UAPR, USPG, UPH, UTP, UGL, [place], UBIL, UNIT, UROB, [place], UEPI, UWBC, Junie Panning [place], Ali Molina, [place]  Basic Metabolic Panel: Recent Labs  Lab 02/03/21 0604 02/04/21 0540 02/05/21 0444 02/06/21 0604 02/07/21 0521 02/08/21 0600  NA 135 135 134*  --  130* 134*  K 4.1 4.2 4.0  --  4.0 3.1*  CL 103 102 98  --  93* 105  CO2 '23 26 27  ' --  28 21*  GLUCOSE 148* 121* 106*  --  118* 106*  BUN '14 16 17  ' --  26* 20  CREATININE 0.93 1.09* 0.96  --  1.25* 0.93  CALCIUM 10.4* 9.4 9.9  --  9.9 7.7*  MG 1.9 2.0 2.1 2.2 2.2 1.9  PHOS  --  3.6  --   --  3.8  --    GFR Estimated Creatinine Clearance: 84.7 mL/min (by C-G formula based on SCr  of 0.93 mg/dL). Liver Function Tests: Recent Labs  Lab 02/04/21 0540 02/05/21 0444 02/06/21 0604 02/07/21 0521 02/08/21 0600  AST 46* 36 35 34 29  ALT 36 33 34 30 27  ALKPHOS 315* 285* 289* 248* 179*  BILITOT 10.9* 11.0* 12.3* 9.9* 6.0*  PROT 7.1 7.2 7.9 7.5 6.1*  ALBUMIN 2.6* 2.5* 2.5* 2.4* 1.9*   Recent Labs  Lab 02/01/21 1418 02/01/21 1454  LIPASE 122*  --   AMYLASE  --  132*   No results for input(s): AMMONIA in the last 168 hours. Coagulation profile Recent Labs  Lab 02/01/21 1418 02/02/21 0515  INR 1.1 1.0    CBC: Recent Labs  Lab 02/01/21 1109 02/01/21 1418 02/02/21 0515 02/03/21 0604 02/04/21 0540 02/05/21 0444 02/06/21 0604 02/07/21 0521 02/08/21 0600  WBC 3.2* 4.0 3.8*   < > 5.6 8.5 11.6* 12.3* 13.3*  NEUTROABS 1.8 2.1 2.0  --   --   --   --   --   --   HGB 10.5* 9.8* 8.8*   < >  10.0* 10.2* 11.1* 10.5* 10.6*  HCT 31.2* 29.6* 27.1*   < > 30.6* 30.9* 34.5* 32.2* 33.0*  MCV 98.7 101.4* 102.3*   < > 101.7* 100.7* 103.0* 101.3* 100.9*  PLT 236 221 245   < > 245 253 314 336 387   < > = values in this interval not displayed.   Cardiac Enzymes: No results for input(s): CKTOTAL, CKMB, CKMBINDEX, TROPONINI in the last 168 hours. BNP: Invalid input(s): POCBNP CBG: No results for input(s): GLUCAP in the last 168 hours. D-Dimer No results for input(s): DDIMER in the last 72 hours. Hgb A1c No results for input(s): HGBA1C in the last 72 hours. Lipid Profile No results for input(s): CHOL, HDL, LDLCALC, TRIG, CHOLHDL, LDLDIRECT in the last 72 hours. Thyroid function studies No results for input(s): TSH, T4TOTAL, T3FREE, THYROIDAB in the last 72 hours.  Invalid input(s): FREET3 Anemia work up No results for input(s): VITAMINB12, FOLATE, FERRITIN, TIBC, IRON, RETICCTPCT in the last 72 hours. Microbiology Recent Results (from the past 240 hour(s))  Resp Panel by RT-PCR (Flu A&B, Covid) Nasopharyngeal Swab     Status: None   Collection Time: 02/01/21   6:27 PM   Specimen: Nasopharyngeal Swab; Nasopharyngeal(NP) swabs in vial transport medium  Result Value Ref Range Status   SARS Coronavirus 2 by RT PCR NEGATIVE NEGATIVE Final    Comment: (NOTE) SARS-CoV-2 target nucleic acids are NOT DETECTED.  The SARS-CoV-2 RNA is generally detectable in upper respiratory specimens during the acute phase of infection. The lowest concentration of SARS-CoV-2 viral copies this assay can detect is 138 copies/mL. A negative result does not preclude SARS-Cov-2 infection and should not be used as the sole basis for treatment or other patient management decisions. A negative result may occur with  improper specimen collection/handling, submission of specimen other than nasopharyngeal swab, presence of viral mutation(s) within the areas targeted by this assay, and inadequate number of viral copies(<138 copies/mL). A negative result must be combined with clinical observations, patient history, and epidemiological information. The expected result is Negative.  Fact Sheet for Patients:  EntrepreneurPulse.com.au  Fact Sheet for Healthcare Providers:  IncredibleEmployment.be  This test is no t yet approved or cleared by the [place] FDA and  has been authorized for detection and/or diagnosis of SARS-CoV-2 by FDA under an Emergency Use Authorization (EUA). This EUA will remain  in effect (meaning this test can be used) for the duration of the COVID-19 declaration under Section 564(b)(1) of the Act, 21 U.S.C.section 360bbb-3(b)(1), unless the authorization is terminated  or revoked sooner.       Influenza A by PCR NEGATIVE NEGATIVE Final   Influenza B by PCR NEGATIVE NEGATIVE Final    Comment: (NOTE) The Xpert Xpress SARS-CoV-2/FLU/RSV plus assay is intended as an aid in the diagnosis of influenza from Nasopharyngeal swab specimens and should not be used as a sole basis for treatment. Nasal washings and aspirates  are unacceptable for Xpert Xpress SARS-CoV-2/FLU/RSV testing.  Fact Sheet for Patients: EntrepreneurPulse.com.au  Fact Sheet for Healthcare Providers: IncredibleEmployment.be  This test is not yet approved or cleared by the [place] FDA and has been authorized for detection and/or diagnosis of SARS-CoV-2 by FDA under an Emergency Use Authorization (EUA). This EUA will remain in effect (meaning this test can be used) for the duration of the COVID-19 declaration under Section 564(b)(1) of the Act, 21 U.S.C. section 360bbb-3(b)(1), unless the authorization is terminated or revoked.  Performed at [facility], [address] [place],  [place] [address]   Aerobic/Anaerobic Culture w Gram Stain (surgical/deep wound)     Status: None (Preliminary result)   Collection Time: 02/03/21 11:18 AM   Specimen: BILE  Result Value Ref Range Status   Specimen Description   Final    BILE Performed at [facility] [address] [place], [place] [address]    Special Requests   Final    NONE Performed at [facility], [place] [address], [place], [place] [address]    Gram Stain   Final    RARE WBC PRESENT,BOTH PMN AND MONONUCLEAR NO ORGANISMS SEEN Performed at [facility], [place] [address], [place], [place] [address]    Culture   Final    RARE STREPTOCOCCUS PARASANGUINIS NO ANAEROBES ISOLATED; CULTURE IN PROGRESS FOR 5 DAYS    Report Status PENDING  Incomplete   Organism ID, Bacteria STREPTOCOCCUS PARASANGUINIS  Final      Susceptibility   Streptococcus parasanguinis - MIC*    PENICILLIN 0.25 INTERMEDIATE Intermediate     CEFTRIAXONE <=0.12 SENSITIVE Sensitive     LEVOFLOXACIN >=16 RESISTANT Resistant     VANCOMYCIN 0.5 SENSITIVE Sensitive     * RARE STREPTOCOCCUS PARASANGUINIS      Studies:  CT ABDOMEN PELVIS WO CONTRAST  Result Date: 02/06/2021 CLINICAL DATA:  History of  metastatic breast cancer with jaundice, post placement of percutaneous biliary drainage catheter on 02/03/2021 EXAM: CT ABDOMEN AND PELVIS WITHOUT CONTRAST TECHNIQUE: Multidetector CT imaging of the abdomen and pelvis was performed following the standard protocol without IV contrast. COMPARISON:  CT abdomen and pelvis-02/01/2021; 12/29/2020 MRCP-02/01/2021 image guided placement of percutaneous biliary drainage catheter-02/03/2021 FINDINGS: Lower chest: Limited visualization of the lower thorax demonstrates bibasilar subsegmental atelectasis, left greater than right. No pleural effusion. The tip of a port a catheter terminates at the level of the superior cavoatrial junction. Normal heart size. Trace amount of pericardial fluid, presumably physiologic. There is diffuse decreased attenuation of the intra cardiac blood pool suggestive of anemia. Coronary artery calcifications. Hepatobiliary: Interval placement of left hepatic lobe approach internal/external biliary drainage catheter with radiopaque side marker located at the level of the biliary hilum and distal end appropriately positioned within the descending portion of the duodenum. Evaluation for residual intrahepatic biliary ductal dilatation is degraded secondary to the lack of intravenous contrast. The gallbladder is filled with radiopaque material, likely contrast from placement of the internal/external biliary drainage catheter. No definitive gallbladder wall thickening or pericholecystic stranding on this noncontrast examination. No ascites. Pancreas: Questioned pancreatic head mass on preceding MRCP is suboptimally evaluated on this noncontrast examination (image 38, series 2), however there has been development of perihepatic ascites seen both at the level of the gastrohepatic ligament (image 29, series 2) as well as within the adjacent ventral aspect of the abdominal mesentery (image 43, series 2). While incompletely evaluated this noncontrast  examination there is no definitive organization of this fluid to suggest the presence of a pseudo cyst. Spleen: Normal noncontrast appearance of the spleen. Adrenals/Urinary Tract: Redemonstrated moderate right-sided ureterectasis and pelvicaliectasis with abrupt tapering at the mid aspect of the right ureter (image 62, series 2). Again, the etiology of this obstruction is not depicted on this examination, specifically, no radiopaque renal stones. Normal noncontrast appearance of the left kidney. No evidence of left-sided nephrolithiasis or urinary obstruction. Normal noncontrast appearance of the bilateral adrenal glands. Normal noncontrast appearance of the urinary bladder given underdistention. Stomach/Bowel: Ingested enteric contrast extends to the level of the proximal  descending colon. Moderate colonic stool burden without evidence of enteric obstruction. Normal noncontrast appearance of the terminal ileum. The appendix is not visualized, however there is no pericecal inflammatory change on this noncontrast examination. No significant hiatal hernia. No pneumoperitoneum, pneumatosis or portal venous gas. Vascular/Lymphatic: Scattered atherosclerotic plaque within a normal caliber abdominal aorta. Redemonstrated scattered retroperitoneal adenopathy with index left-sided periaortic lymph node measuring 1.3 cm in greatest short axis diameter (image 44, series 2). Reproductive: Post hysterectomy. No discrete adnexal lesions. Interval development of small amount of free fluid in the pelvic cul-de-sac. Other: There is a minimal amount of subcutaneous edema about the midline of the low back. Musculoskeletal: Redemonstrated rather extensive osseous metastatic disease with multiple predominantly sclerotic lesions seen throughout the thoracic and lumbar spine, the pelvis, the inferior tip of the right scapula (image 12, series 2) and several right-sided ribs. No definitive evidence of impending pathologic fracture.  Regional soft tissues appear normal. IMPRESSION: 1. Stable sequela of internal/external left-sided biliary drainage catheter placement. The drainage catheter is appropriately positioned with radiopaque marker at the level of the biliary hilum and distal coil located within the duodenum. Evaluation for residual intrahepatic biliary ductal dilatation is suboptimally evaluated on this noncontrast examination though both the right and left biliary systems were noted to communicate at the time of biliary drainage catheter placement on 02/03/2021. 2. Worsening peripancreatic stranding/fluid worrisome for progressive pancreatitis, incompletely evaluated on this noncontrast examination. Note, questioned pancreatic head mass on recent MRCP is suboptimally evaluated on this noncontrast abdominal CT. 3. Redemonstrated right-sided pelvicaliectasis and ureterectasis to the level of the mid aspect of the right ureter, the etiology of which is not depicted on this examination. Specifically, no radiopaque renal stones. 4. Redemonstrated extensive osseous metastatic disease and mild retroperitoneal lymphadenopathy. 5.  Aortic Atherosclerosis (ICD10-I70.0). Electronically Signed   By: Sandi Mariscal M.D.   On: 02/06/2021 15:27    Assessment: 55 y.o. [place] woman status post right breast upper outer quadrant biopsy 03/27/2020 for a clinical T1c N3 M1, stageIV invasive ductal carcinoma, functionally triple negative, with an MIB-1 of 30-40%             (a) chest CT scan 04/20/2020 shows in addition to the breast mass and regional adenopathy, lytic and sclerotic bone changes, possible left thoracic adenopathy, and indeterminant left pleural nodularity             (b) bone scan 04/21/2020 confirms multiple bone lesions             (c) thoracic MRI confirms diffuse bony metastatic disease but shows no epidural soft tissue component and no cord compression   (1) genetics testing 04/24/2020 through the [place] [facility]  RNAinsight panel found no deleterious mutations in APC, ATM, AXIN2, BARD1, BMPR1A, BRCA1, BRCA2, BRIP1, CDH1, CDK4, CDKN2A, CHEK2, DICER1, EPCAM, GREM1, HOXB13, MEN1, MLH1, MSH2, MSH3, MSH6, MUTYH, NBN, NF1, NF2, NTHL1, PALB2, PMS2, POLD1, POLE, PTEN, RAD51C, RAD51D, RECQL, RET, SDHA, SDHAF2, SDHB, SDHC, SDHD, SMAD4, SMARCA4, STK11, TP53, TSC1, TSC2, and VHL.  RNA data is routinely analyzed for use in variant interpretation for all genes.   (2) neoadjuvant chemotherapy will consist of pembrolizumab every 3 weeks, carboplatin every 3 weeks x 4 with paclitaxel weekly x12, started 04/24/2020, completed 07/10/2020             (a) consider doxorubicin and cyclophosphamide every 3 weeks x 4 at disease progression   (3) zoledronate started 05/08/2018, repeated every 12 weeks   (4) molecular studies:             (  A) PT-L1 combined positive score of 5 (on January 2022 specimen)             (B) foundation 1 pending   (5) restaging studies:             (a) post-chemo breast MRI 06/30/2020 shows significant response             (b) CT chest 07/09/2020 shows decreased right axillary adenopathy, no lung or liver lesions, healing of lytic bone lesions             (c) CT chest 10/07/2020 shows progression of the right axillary adenopathy and upper abdominal retroperitoneal adenopathy, with stable scattered sclerotic bone lesions   (6) adjuvant pembrolizumab resumed 07/31/2020, discontinued after 10/02/2020 dose with disease progression   (7) anastrozole started 07/26/2020 given (weak) estrogen and progesterone receptor positivity             (A) MRI breast 09/28/2020 shows local progression in the right breast and axilla-- anastrozole discontinued              (8) fulvestrant started 10/08/2020             (A) abemaciclib added 10/24/2018             (B) fulvestrant discontinued after right modified radical mastectomy with repeat prognostic panel showing the tumor to be triple negative             (C)  abemaciclib discontinued November 2022 with increased liver function tests   (9) status post right modified radical mastectomy on 12/07/2020 for a residual ympT2 ypN3a invasive ductal carcinoma, grade 3, with negative margins             (a) a total of 13 right axillary lymph nodes removed, 12 positive, with evidence of extracapsular extension; residual cancer burden III             (b) repeat prognostic panel estrogen and progesterone receptor negative, HER2 negative (1+).     (10) adjuvant radiation to be completed 03/03/2021   (11) admitted 02/01/2021 with worsening biliary obstruction of unknown cause:             (A) CT scans of the chest abdomen and pelvis 12/29/2020 show no liver or lung involvement.  There is some left retroperitoneal adenopathy which may be the only site of measurable disease and there are some bone lesions.             (B) right upper quadrant abdominal ultrasound 01/28/2021 shows gallbladder sludge, no liver lesions   (C) CT scan of the abdomen and pelvis 02/01/2021 shows intrahepatic biliary dilatation, gallbladder wall thickening, narrowing of the hepatic ducts, stranding around the pancreatic head, and new right hydroureter  (D) noncontrast MRI of the abdomen 02/01/2021 shows no liver masses, dilatation of the intrahepatic bile ducts with stricture of the common hepatic duct in the liver hilum, no evidence of cholecystitis, a solid 3.6 cm mass in the pancreatic head consistent with focal pancreatitis, moderate right hydronephrosis  (E) ERCP 02/02/2021--unable to cannulate CBD  (F) external biliary drain placement with cholangiogram 02/02/2021; 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  Questions at this point include what is the cause of the biliary obstruction, should the external drain be internalized, what is going on with her pancreas, and why does she have a right ureteral  obstruction.  I discussed her situation with  Dr. Benson Norway.  He tells me he would like to do an endoscopic ultrasound.  This could be done on Thursday if the patient is still in the hospital.  If she is discharged he will arrange for it on an outpatient basis.  He also suggested we could likely advance diet as tolerated at this point.  I do not find a brushings were sent from the ERCP.  There is no pathology report from the procedure.  Breniyah has multiple radiation treatments pending.  I will see if radiation oncology can arrange to have her treated while still in the hospital.   Chauncey Cruel, MD 02/08/2021  10:47 AM Medical Oncology and Hematology [facility] [place],  [address] Tel. [phone]    Fax. [phone]

## 2021-02-08 NOTE — Progress Notes (Signed)
PROGRESS NOTE    DESHUNDA THACKSTON  IWO:[id number] DOB: 04-19-1965 DOA: 02/01/2021  PCP: [name], MD   Brief Narrative: This 55 year old Female with PMH significant of metastatic breast cancer, Essential hypertension presented in the ED with complaints of generalized pruritus. She had follow-up with oncologist and was diagnosed with elevated total bilirubin.  Initially It was thought due to her immunotherapy Verzenio and it was stopped.  Then she started having episodes of nausea,  vomiting, change in her stool color.  Work-up showed elevated total bilirubin. CT abdomen pelvis showed concerning new right upper quadrant ductal dilation and concerning features of pancreatitis along with possible cholangitis/cholecystitis. GI team was consulted.  Patient underwent ERCP which could not cannulate biliary duct.  Patient underwent percutaneous biliary drainage by IR.  Patient's total bilirubin is trending up.  IR recommended repeat CT scan.  Assessment & Plan:   Principal Problem:   Jaundice Active Problems:   Essential hypertension, benign   Malignant neoplasm of upper-outer quadrant of right breast in female, estrogen receptor positive ([place])   Acute pancreatitis   Biliary obstruction: Acute pancreatitis versus metastatic lesion: Patient presented with abdominal pain, nausea, vomiting,  jaundice,  change in stool color and pruritus. CT showed  some evidence of obstruction/inflammation in the right upper quadrant area.  MRCP showed pancreatic head mass concerning for focal acute pancreatitis versus metastatic lesion.   Gastroenterology consulted.  Patient underwent ERCP which could not cannulate biliary duct. IR was consulted, patient underwent successful percutaneous drainage. 11/23 Continue to monitor CMP. Liver enzymes trending down.  Bilirubin trending up. Continue Zofran and Phenergan as needed for nausea and vomiting. Follow-up cultures. She still has significant output in the  biliary drain. Total bilirubin is trending up 7.3 >8.6 >11.2 >10.5 >9.8>10.2>10.9>11.0>12.3. Repeat CT scan abdomen pelvis showed worsening pancreatitis. Now Bilirubin trending down 9.9>6.0. Patient still has significant biliary output.  Will discuss with IR about next plan.  Right ureteral hydronephrosis: UA negative.  Urine culture not collected. Renal ultrasound shows Severe right-sided hydronephrosis compatible with high-grade stenosis. Urology is consulted, there is no need for any stenting.   Patient denies any flank pain, serum creatinine remains normal. Repeat renal ultrasound shows improving Right sided hydronephrosis. She denies any urinary symptoms.   Essential hypertension: Continue propranolol and Aldactone.  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 [name].  Microcytic anemia: H&H is stable.   AKI: Resolved. Baseline creatinine normal, serum creatinine up to 1.25 Avoid nephrotoxic medications,  resolved with IV hydration.  DVT prophylaxis: SCDs Code Status: Full code. Family Communication: No family at bed side. Disposition Plan:   Status is: Inpatient  Remains inpatient appropriate because:  ERCP could not cannulate the biliary duct.  Patient underwent successful percutaneous biliary drain. Total bilirubin is trending up.  Repeat CT abdomen pelvis showed worsening pancreatitis.  Patient still has significant biliary output.  Awaiting IR recommendation.   Consultants:  GI IR  Procedures: ERCP ,   Percutaneous biliary drain.  Antimicrobials:   Anti-infectives (From admission, onward)    Start     Dose/Rate Route Frequency Ordered Stop   02/03/21 1130  cefOXitin (MEFOXIN) 2 g in sodium chloride 0.9 % 100 mL IVPB        2 g 200 mL/hr over 30 Minutes Intravenous  Once 02/03/21 1033 02/03/21 1124        Subjective: Patient was seen and examined at bedside. Overnight events noted.   Patient underwent percutaneous  biliary drain by  IR  on 11/23. She reports pain around the biliary drain area. She denies any Nausea and vomiting She still has significant amount of drain output in her biliary drain.   Objective: Vitals:   02/07/21 1034 02/07/21 1441 02/07/21 2036 02/08/21 0542  BP: 113/80 (!) 136/91 114/73 110/70  Pulse: 99 97 99 97  Resp:   16 18  Temp:  98.4 F (36.9 C) 98 F (36.7 C) 98.1 F (36.7 C)  TempSrc:  Oral Oral Oral  SpO2:  95% 98% 96%  Weight:      Height:        Intake/Output Summary (Last 24 hours) at 02/08/2021 1148 Last data filed at 02/08/2021 1026 Gross per 24 hour  Intake 1254.42 ml  Output 2475 ml  Net -1220.58 ml   Filed Weights   02/01/21 1300 02/02/21 1347  Weight: 91.2 kg 91.2 kg    Examination:  General exam: Appears deconditioned, comfortable, not in any distress. Respiratory system: CTA bilaterally. Respiratory effort normal. RR 15 Cardiovascular system: S1-S2 heard, regular rate and rhythm, no murmur. Chest: Chemo-Port noted in the left chest. Gastrointestinal system: Abdomen is soft, mildly tender around biliary drain area.  Mildly distended, BS+.   Biliary drain noted with clear yellow-colored fluid. Central nervous system: Alert and oriented x 3. No focal neurological deficits. Extremities: No edema, no cyanosis, no clubbing. Skin: No rashes, lesions or ulcers Psychiatry: Judgement and insight appear normal. Mood & affect appropriate.     Data Reviewed: I have personally reviewed following labs and imaging studies  CBC: Recent Labs  Lab 02/01/21 1418 02/02/21 0515 02/03/21 0604 02/04/21 0540 02/05/21 0444 02/06/21 0604 02/07/21 0521 02/08/21 0600  WBC 4.0 3.8*   < > 5.6 8.5 11.6* 12.3* 13.3*  NEUTROABS 2.1 2.0  --   --   --   --   --   --   HGB 9.8* 8.8*   < > 10.0* 10.2* 11.1* 10.5* 10.6*  HCT 29.6* 27.1*   < > 30.6* 30.9* 34.5* 32.2* 33.0*  MCV 101.4* 102.3*   < > 101.7* 100.7* 103.0* 101.3* 100.9*  PLT 221 245   < > 245 253 314  336 387   < > = values in this interval not displayed.   Basic Metabolic Panel: Recent Labs  Lab 02/03/21 0604 02/04/21 0540 02/05/21 0444 02/06/21 0604 02/07/21 0521 02/08/21 0600  NA 135 135 134*  --  130* 134*  K 4.1 4.2 4.0  --  4.0 3.1*  CL 103 102 98  --  93* 105  CO2 23 26 27   --  28 21*  GLUCOSE 148* 121* 106*  --  118* 106*  BUN 14 16 17   --  26* 20  CREATININE 0.93 1.09* 0.96  --  1.25* 0.93  CALCIUM 10.4* 9.4 9.9  --  9.9 7.7*  MG 1.9 2.0 2.1 2.2 2.2 1.9  PHOS  --  3.6  --   --  3.8  --    GFR: Estimated Creatinine Clearance: 84.7 mL/min (by C-G formula based on SCr of 0.93 mg/dL). Liver Function Tests: Recent Labs  Lab 02/04/21 0540 02/05/21 0444 02/06/21 0604 02/07/21 0521 02/08/21 0600  AST 46* 36 35 34 29  ALT 36 33 34 30 27  ALKPHOS 315* 285* 289* 248* 179*  BILITOT 10.9* 11.0* 12.3* 9.9* 6.0*  PROT 7.1 7.2 7.9 7.5 6.1*  ALBUMIN 2.6* 2.5* 2.5* 2.4* 1.9*   Recent Labs  Lab 02/01/21 1418 02/01/21 1454  LIPASE 122*  --  AMYLASE  --  132*   No results for input(s): AMMONIA in the last 168 hours. Coagulation Profile: Recent Labs  Lab 02/01/21 1418 02/02/21 0515  INR 1.1 1.0   Cardiac Enzymes: No results for input(s): CKTOTAL, CKMB, CKMBINDEX, TROPONINI in the last 168 hours. BNP (last 3 results) No results for input(s): PROBNP in the last 8760 hours. HbA1C: No results for input(s): HGBA1C in the last 72 hours. CBG: No results for input(s): GLUCAP in the last 168 hours. Lipid Profile: No results for input(s): CHOL, HDL, LDLCALC, TRIG, CHOLHDL, LDLDIRECT in the last 72 hours. Thyroid Function Tests: No results for input(s): TSH, T4TOTAL, FREET4, T3FREE, THYROIDAB in the last 72 hours.  Anemia Panel: No results for input(s): VITAMINB12, FOLATE, FERRITIN, TIBC, IRON, RETICCTPCT in the last 72 hours. Sepsis Labs: No results for input(s): PROCALCITON, LATICACIDVEN in the last 168 hours.  Recent Results (from the past 240 hour(s))  Resp  Panel by RT-PCR (Flu A&B, Covid) Nasopharyngeal Swab     Status: None   Collection Time: 02/01/21  6:27 PM   Specimen: Nasopharyngeal Swab; Nasopharyngeal(NP) swabs in vial transport medium  Result Value Ref Range Status   SARS Coronavirus 2 by RT PCR NEGATIVE NEGATIVE Final    Comment: (NOTE) SARS-CoV-2 target nucleic acids are NOT DETECTED.  The SARS-CoV-2 RNA is generally detectable in upper respiratory specimens during the acute phase of infection. The lowest concentration of SARS-CoV-2 viral copies this assay can detect is 138 copies/mL. A negative result does not preclude SARS-Cov-2 infection and should not be used as the sole basis for treatment or other patient management decisions. A negative result may occur with  improper specimen collection/handling, submission of specimen other than nasopharyngeal swab, presence of viral mutation(s) within the areas targeted by this assay, and inadequate number of viral copies(<138 copies/mL). A negative result must be combined with clinical observations, patient history, and epidemiological information. The expected result is Negative.  Fact Sheet for Patients:  EntrepreneurPulse.com.au  Fact Sheet for Healthcare Providers:  IncredibleEmployment.be  This test is no t yet approved or cleared by the [place] FDA and  has been authorized for detection and/or diagnosis of SARS-CoV-2 by FDA under an Emergency Use Authorization (EUA). This EUA will remain  in effect (meaning this test can be used) for the duration of the COVID-19 declaration under Section 564(b)(1) of the Act, 21 U.S.C.section 360bbb-3(b)(1), unless the authorization is terminated  or revoked sooner.       Influenza A by PCR NEGATIVE NEGATIVE Final   Influenza B by PCR NEGATIVE NEGATIVE Final    Comment: (NOTE) The Xpert Xpress SARS-CoV-2/FLU/RSV plus assay is intended as an aid in the diagnosis of influenza from Nasopharyngeal  swab specimens and should not be used as a sole basis for treatment. Nasal washings and aspirates are unacceptable for Xpert Xpress SARS-CoV-2/FLU/RSV testing.  Fact Sheet for Patients: EntrepreneurPulse.com.au  Fact Sheet for Healthcare Providers: IncredibleEmployment.be  This test is not yet approved or cleared by the [place] FDA and has been authorized for detection and/or diagnosis of SARS-CoV-2 by FDA under an Emergency Use Authorization (EUA). This EUA will remain in effect (meaning this test can be used) for the duration of the COVID-19 declaration under Section 564(b)(1) of the Act, 21 U.S.C. section 360bbb-3(b)(1), unless the authorization is terminated or revoked.  Performed at [facility], [place] [address], [place], [place] [address]   Aerobic/Anaerobic Culture w Gram Stain (surgical/deep wound)     Status: None  Collection Time: 02/03/21 11:18 AM   Specimen: BILE  Result Value Ref Range Status   Specimen Description   Final    BILE Performed at [facility] [address], [place], [place] [address]    Special Requests   Final    NONE Performed at [facility], [place] [address], [place], [place] [address]    Gram Stain   Final    RARE WBC PRESENT,BOTH PMN AND MONONUCLEAR NO ORGANISMS SEEN    Culture   Final    RARE STREPTOCOCCUS PARASANGUINIS NO ANAEROBES ISOLATED Performed at [facility], [place] [address], [place], [place] [address]    Report Status 02/08/2021 FINAL  Final   Organism ID, Bacteria STREPTOCOCCUS PARASANGUINIS  Final      Susceptibility   Streptococcus parasanguinis - MIC*    PENICILLIN 0.25 INTERMEDIATE Intermediate     CEFTRIAXONE <=0.12 SENSITIVE Sensitive     LEVOFLOXACIN >=16 RESISTANT Resistant     VANCOMYCIN 0.5 SENSITIVE Sensitive     * RARE STREPTOCOCCUS PARASANGUINIS    Radiology Studies: CT ABDOMEN PELVIS WO  CONTRAST  Result Date: 02/06/2021 CLINICAL DATA:  History of metastatic breast cancer with jaundice, post placement of percutaneous biliary drainage catheter on 02/03/2021 EXAM: CT ABDOMEN AND PELVIS WITHOUT CONTRAST TECHNIQUE: Multidetector CT imaging of the abdomen and pelvis was performed following the standard protocol without IV contrast. COMPARISON:  CT abdomen and pelvis-02/01/2021; 12/29/2020 MRCP-02/01/2021 image guided placement of percutaneous biliary drainage catheter-02/03/2021 FINDINGS: Lower chest: Limited visualization of the lower thorax demonstrates bibasilar subsegmental atelectasis, left greater than right. No pleural effusion. The tip of a port a catheter terminates at the level of the superior cavoatrial junction. Normal heart size. Trace amount of pericardial fluid, presumably physiologic. There is diffuse decreased attenuation of the intra cardiac blood pool suggestive of anemia. Coronary artery calcifications. Hepatobiliary: Interval placement of left hepatic lobe approach internal/external biliary drainage catheter with radiopaque side marker located at the level of the biliary hilum and distal end appropriately positioned within the descending portion of the duodenum. Evaluation for residual intrahepatic biliary ductal dilatation is degraded secondary to the lack of intravenous contrast. The gallbladder is filled with radiopaque material, likely contrast from placement of the internal/external biliary drainage catheter. No definitive gallbladder wall thickening or pericholecystic stranding on this noncontrast examination. No ascites. Pancreas: Questioned pancreatic head mass on preceding MRCP is suboptimally evaluated on this noncontrast examination (image 38, series 2), however there has been development of perihepatic ascites seen both at the level of the gastrohepatic ligament (image 29, series 2) as well as within the adjacent ventral aspect of the abdominal mesentery (image 43,  series 2). While incompletely evaluated this noncontrast examination there is no definitive organization of this fluid to suggest the presence of a pseudo cyst. Spleen: Normal noncontrast appearance of the spleen. Adrenals/Urinary Tract: Redemonstrated moderate right-sided ureterectasis and pelvicaliectasis with abrupt tapering at the mid aspect of the right ureter (image 62, series 2). Again, the etiology of this obstruction is not depicted on this examination, specifically, no radiopaque renal stones. Normal noncontrast appearance of the left kidney. No evidence of left-sided nephrolithiasis or urinary obstruction. Normal noncontrast appearance of the bilateral adrenal glands. Normal noncontrast appearance of the urinary bladder given underdistention. Stomach/Bowel: Ingested enteric contrast extends to the level of the proximal descending colon. Moderate colonic stool burden without evidence of enteric obstruction. Normal noncontrast appearance of the terminal ileum. The appendix is not visualized, however there is no pericecal  inflammatory change on this noncontrast examination. No significant hiatal hernia. No pneumoperitoneum, pneumatosis or portal venous gas. Vascular/Lymphatic: Scattered atherosclerotic plaque within a normal caliber abdominal aorta. Redemonstrated scattered retroperitoneal adenopathy with index left-sided periaortic lymph node measuring 1.3 cm in greatest short axis diameter (image 44, series 2). Reproductive: Post hysterectomy. No discrete adnexal lesions. Interval development of small amount of free fluid in the pelvic cul-de-sac. Other: There is a minimal amount of subcutaneous edema about the midline of the low back. Musculoskeletal: Redemonstrated rather extensive osseous metastatic disease with multiple predominantly sclerotic lesions seen throughout the thoracic and lumbar spine, the pelvis, the inferior tip of the right scapula (image 12, series 2) and several right-sided ribs. No  definitive evidence of impending pathologic fracture. Regional soft tissues appear normal. IMPRESSION: 1. Stable sequela of internal/external left-sided biliary drainage catheter placement. The drainage catheter is appropriately positioned with radiopaque marker at the level of the biliary hilum and distal coil located within the duodenum. Evaluation for residual intrahepatic biliary ductal dilatation is suboptimally evaluated on this noncontrast examination though both the right and left biliary systems were noted to communicate at the time of biliary drainage catheter placement on 02/03/2021. 2. Worsening peripancreatic stranding/fluid worrisome for progressive pancreatitis, incompletely evaluated on this noncontrast examination. Note, questioned pancreatic head mass on recent MRCP is suboptimally evaluated on this noncontrast abdominal CT. 3. Redemonstrated right-sided pelvicaliectasis and ureterectasis to the level of the mid aspect of the right ureter, the etiology of which is not depicted on this examination. Specifically, no radiopaque renal stones. 4. Redemonstrated extensive osseous metastatic disease and mild retroperitoneal lymphadenopathy. 5.  Aortic Atherosclerosis (ICD10-I70.0). Electronically Signed   By: Sandi Mariscal M.D.   On: 02/06/2021 15:27    Scheduled Meds:  Chlorhexidine Gluconate Cloth  6 each Topical Daily   cholecalciferol  5,000 Units Oral q morning   feeding supplement  1 Container Oral TID BM   multivitamin with minerals  1 tablet Oral Daily   potassium chloride  40 mEq Oral Once   propranolol  10 mg Oral BID   sodium chloride flush  5 mL Intracatheter Q8H   spironolactone  25 mg Oral BID   vitamin B-12  5,000 mcg Oral q morning   Continuous Infusions:  promethazine (PHENERGAN) injection (IM or IVPB)       LOS: 6 days    Time spent: 25 mins    Shawna Clamp, MD [facility]   If 7PM-7AM, please contact night-coverage

## 2021-02-08 NOTE — Discharge Instructions (Signed)
Advised to follow-up with primary care physician in 1 week. Advised to follow-up with Dr. [name] oncologist this week. Advised to follow-up with IR with percutaneous biliary drain.

## 2021-02-08 NOTE — H&P (View-Only) (Signed)
Subjective: [name] is a 55-year-old black female with metastatic breast cancers and essential hypertension presented to emergency room with pruritus she was noted to have hyperbilirubinemia and this was thought to be due to immunotherapy which was stopped however a CT scan of the abdomen pelvis showed concerns for ductal dilation and some fullness in the head of the pancreas..  An ERCP was attempted but the bile duct could not be cannulated and therefore PTC was done by IR.  Patient has done well since the procedure and denies any abdominal pain.  Her last BM was about a week ago.  Total bilirubin is 6 today with albumin of 1.9 and alkaline phosphatase of 179 AST and ALT are 29/27.  She claims she feels slightly better better today is very fatigued as she had her radiation treatment earlier today. The pruritus she was having is definitely improved.  She is still on clear liquids.  Objective: Vital signs in last 24 hours: Temp:  [97.9 F (36.6 C)-98.1 F (36.7 C)] 97.9 F (36.6 C) (11/28 1348) Pulse Rate:  [89-99] 89 (11/28 1348) Resp:  [16-18] 16 (11/28 1348) BP: (110-127)/(70-84) 127/84 (11/28 1348) SpO2:  [96 %-99 %] 99 % (11/28 1348) Last BM Date: 02/02/21  Intake/Output from previous day: 11/27 0701 - 11/28 0700 In: 1254.4 [P.O.:360; I.V.:884.4] Out: 2525 [Drains:2525] Intake/Output this shift: Total I/O In: -  Out: 375 [Drains:375]  General appearance: alert, cooperative, appears stated age, and mild distress Resp: clear to auscultation bilaterally Cardio: regular rate and rhythm, S1, S2 normal, no murmur, click, rub or gallop GI: soft, non-tender; hypoactive bowel sounds no masses,  no organomegaly; PTC drain in place  Lab Results: Recent Labs    02/06/21 0604 02/07/21 0521 02/08/21 0600  WBC 11.6* 12.3* 13.3*  HGB 11.1* 10.5* 10.6*  HCT 34.5* 32.2* 33.0*  PLT 314 336 387   BMET Recent Labs    02/07/21 0521 02/08/21 0600  NA 130* 134*  K 4.0 3.1*  CL 93* 105  CO2  28 21*  GLUCOSE 118* 106*  BUN 26* 20  CREATININE 1.25* 0.93  CALCIUM 9.9 7.7*   LFT Recent Labs    02/06/21 0604 02/07/21 0521 02/08/21 0600  PROT 7.9   < > 6.1*  ALBUMIN 2.5*   < > 1.9*  AST 35   < > 29  ALT 34   < > 27  ALKPHOS 289*   < > 179*  BILITOT 12.3*   < > 6.0*  BILIDIR 7.6*  --   --   IBILI 4.7*  --   --    < > = values in this interval not displayed.   Studies/Results: No results found.  Medications: I have reviewed the patient's current medications.  Assessment/Plan: 1) Biliary obstruction-etiology unclear abnormal CT scan showing fullness in the head of the pancreas inflammation versus obstruction-bilirubin seems to be improved after PTC and EUS planned for the patient tomorrow. 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 3) Microcytic anemia.  4) Right ureteral hydronephrosis.  LOS: 6 days   Shloka Baldridge 02/08/2021, 4:30 PM   

## 2021-02-08 NOTE — Progress Notes (Signed)
Subjective: [name] is a 55 year old black female with metastatic breast cancers and essential hypertension presented to emergency room with pruritus she was noted to have hyperbilirubinemia and this was thought to be due to immunotherapy which was stopped however a CT scan of the abdomen pelvis showed concerns for ductal dilation and some fullness in the head of the pancreas..  An ERCP was attempted but the bile duct could not be cannulated and therefore PTC was done by IR.  Patient has done well since the procedure and denies any abdominal pain.  Her last BM was about a week ago.  Total bilirubin is 6 today with albumin of 1.9 and alkaline phosphatase of 179 AST and ALT are 29/27.  She claims she feels slightly better better today is very fatigued as she had her radiation treatment earlier today. The pruritus she was having is definitely improved.  She is still on clear liquids.  Objective: Vital signs in last 24 hours: Temp:  [97.9 F (36.6 C)-98.1 F (36.7 C)] 97.9 F (36.6 C) (11/28 1348) Pulse Rate:  [89-99] 89 (11/28 1348) Resp:  [16-18] 16 (11/28 1348) BP: (110-127)/(70-84) 127/84 (11/28 1348) SpO2:  [96 %-99 %] 99 % (11/28 1348) Last BM Date: 02/02/21  Intake/Output from previous day: 11/27 0701 - 11/28 0700 In: 1254.4 [P.O.:360; I.V.:884.4] Out: 2525 [Drains:2525] Intake/Output this shift: Total I/O In: -  Out: 375 [Drains:375]  General appearance: alert, cooperative, appears stated age, and mild distress Resp: clear to auscultation bilaterally Cardio: regular rate and rhythm, S1, S2 normal, no murmur, click, rub or gallop GI: soft, non-tender; hypoactive bowel sounds no masses,  no organomegaly; PTC drain in place  Lab Results: Recent Labs    02/06/21 0604 02/07/21 0521 02/08/21 0600  WBC 11.6* 12.3* 13.3*  HGB 11.1* 10.5* 10.6*  HCT 34.5* 32.2* 33.0*  PLT 314 336 387   BMET Recent Labs    02/07/21 0521 02/08/21 0600  NA 130* 134*  K 4.0 3.1*  CL 93* 105  CO2  28 21*  GLUCOSE 118* 106*  BUN 26* 20  CREATININE 1.25* 0.93  CALCIUM 9.9 7.7*   LFT Recent Labs    02/06/21 0604 02/07/21 0521 02/08/21 0600  PROT 7.9   < > 6.1*  ALBUMIN 2.5*   < > 1.9*  AST 35   < > 29  ALT 34   < > 27  ALKPHOS 289*   < > 179*  BILITOT 12.3*   < > 6.0*  BILIDIR 7.6*  --   --   IBILI 4.7*  --   --    < > = values in this interval not displayed.   Studies/Results: No results found.  Medications: I have reviewed the patient's current medications.  Assessment/Plan: 1) Biliary obstruction-etiology unclear abnormal CT scan showing fullness in the head of the pancreas inflammation versus obstruction-bilirubin seems to be improved after PTC and EUS planned for the patient tomorrow. 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 3) Microcytic anemia.  4) Right ureteral hydronephrosis.  LOS: 6 days   Juanita Craver 02/08/2021, 4:30 PM

## 2021-02-09 ENCOUNTER — Encounter (HOSPITAL_COMMUNITY)
Admission: EM | Disposition: A | Discharge status: Home Health | Payer: Self-pay | Source: Home / Self Care | Attending: Family Medicine

## 2021-02-09 ENCOUNTER — Encounter (HOSPITAL_COMMUNITY): Payer: Self-pay | Admitting: Internal Medicine

## 2021-02-09 ENCOUNTER — Ambulatory Visit
Admission: RE | Admit: 2021-02-09 | Discharge: 2021-02-09 | Disposition: A | Discharge status: Home / Self Care | Payer: BC Managed Care – PPO | Source: Ambulatory Visit | Attending: Radiation Oncology | Admitting: Radiation Oncology

## 2021-02-09 ENCOUNTER — Inpatient Hospital Stay (HOSPITAL_COMMUNITY): Payer: BC Managed Care – PPO | Admitting: Certified Registered"

## 2021-02-09 DIAGNOSIS — R17 Unspecified jaundice: Secondary | ICD-10-CM | POA: Diagnosis not present

## 2021-02-09 HISTORY — PX: ESOPHAGOGASTRODUODENOSCOPY: SHX5428

## 2021-02-09 HISTORY — PX: UPPER ESOPHAGEAL ENDOSCOPIC ULTRASOUND (EUS): SHX6562

## 2021-02-09 LAB — CBC
HCT: 33 % — ABNORMAL LOW (ref 36.0–46.0)
Hemoglobin: 10.9 g/dL — ABNORMAL LOW (ref 12.0–15.0)
MCH: 33.2 pg (ref 26.0–34.0)
MCHC: 33 g/dL (ref 30.0–36.0)
MCV: 100.6 fL — ABNORMAL HIGH (ref 80.0–100.0)
Platelets: 413 10*3/uL — ABNORMAL HIGH (ref 150–400)
RBC: 3.28 MIL/uL — ABNORMAL LOW (ref 3.87–5.11)
RDW: 13.3 % (ref 11.5–15.5)
WBC: 13.5 10*3/uL — ABNORMAL HIGH (ref 4.0–10.5)
nRBC: 0.2 % (ref 0.0–0.2)

## 2021-02-09 LAB — CYTOLOGY - NON PAP

## 2021-02-09 LAB — COMPREHENSIVE METABOLIC PANEL
ALT: 40 U/L (ref 0–44)
AST: 39 U/L (ref 15–41)
Albumin: 2.5 g/dL — ABNORMAL LOW (ref 3.5–5.0)
Alkaline Phosphatase: 229 U/L — ABNORMAL HIGH (ref 38–126)
Anion gap: 8 (ref 5–15)
BUN: 22 mg/dL — ABNORMAL HIGH (ref 6–20)
CO2: 26 mmol/L (ref 22–32)
Calcium: 10.2 mg/dL (ref 8.9–10.3)
Chloride: 94 mmol/L — ABNORMAL LOW (ref 98–111)
Creatinine, Ser: 1.33 mg/dL — ABNORMAL HIGH (ref 0.44–1.00)
GFR, Estimated: 48 mL/min — ABNORMAL LOW (ref >60)
Glucose, Bld: 123 mg/dL — ABNORMAL HIGH (ref 70–99)
Potassium: 4.2 mmol/L (ref 3.5–5.1)
Sodium: 128 mmol/L — ABNORMAL LOW (ref 135–145)
Total Bilirubin: 6.7 mg/dL — ABNORMAL HIGH (ref 0.3–1.2)
Total Protein: 7.9 g/dL (ref 6.5–8.1)

## 2021-02-09 LAB — MAGNESIUM: Magnesium: 2.4 mg/dL (ref 1.7–2.4)

## 2021-02-09 LAB — PHOSPHORUS: Phosphorus: 3.1 mg/dL (ref 2.5–4.6)

## 2021-02-09 SURGERY — UPPER ESOPHAGEAL ENDOSCOPIC ULTRASOUND (EUS)
Anesthesia: Monitor Anesthesia Care

## 2021-02-09 SURGERY — ESOPHAGEAL ENDOSCOPIC ULTRASOUND (EUS) RADIAL
Anesthesia: Monitor Anesthesia Care

## 2021-02-09 MED ORDER — SODIUM CHLORIDE 0.9 % IV SOLN: INTRAVENOUS | Status: DC

## 2021-02-09 MED ORDER — PROPOFOL 500 MG/50ML IV EMUL
INTRAVENOUS | Status: DC | PRN
  Administered 2021-02-09: 125 ug/kg/min via INTRAVENOUS

## 2021-02-09 MED ORDER — LACTATED RINGERS IV SOLN: INTRAVENOUS | Status: DC | PRN

## 2021-02-09 MED ORDER — ONDANSETRON HCL 4 MG/2ML IJ SOLN
INTRAMUSCULAR | Status: DC | PRN
  Administered 2021-02-09: 4 mg via INTRAVENOUS

## 2021-02-09 MED ORDER — PROPOFOL 10 MG/ML IV BOLUS
INTRAVENOUS | Status: DC | PRN
  Administered 2021-02-09: 20 mg via INTRAVENOUS

## 2021-02-09 MED ORDER — SODIUM CHLORIDE 0.9 % IV BOLUS
500.0000 mL | Freq: Once | INTRAVENOUS | Status: AC
  Administered 2021-02-09: 500 mL via INTRAVENOUS

## 2021-02-09 MED ORDER — SENNA 8.6 MG PO TABS
1.0000 | ORAL_TABLET | Freq: Every day | ORAL | Status: DC | PRN
  Administered 2021-02-09 – 2021-02-10 (×2): 8.6 mg via ORAL
  Filled 2021-02-09: qty 1

## 2021-02-09 MED ORDER — SIMETHICONE 80 MG PO CHEW
80.0000 mg | CHEWABLE_TABLET | Freq: Once | ORAL | Status: AC
  Administered 2021-02-09: 80 mg via ORAL

## 2021-02-09 MED ORDER — SODIUM CHLORIDE 0.9 % IV SOLN: INTRAVENOUS | Status: DC | PRN

## 2021-02-09 NOTE — Progress Notes (Signed)
PROGRESS NOTE    [name]  NKN:[id number] DOB: [birth year] DOA: 02/01/2021  PCP: [name], MD   Brief Narrative: This 55 year old Female with PMH significant of metastatic breast cancer, Essential hypertension presented in the ED with complaints of generalized pruritus. She had follow-up with oncologist and was diagnosed with elevated total bilirubin.  Initially It was thought due to her immunotherapy Verzenio and it was stopped.  Then she started having episodes of nausea,  vomiting, change in her stool color.  Work-up showed elevated total bilirubin. CT abdomen pelvis showed concerning new right upper quadrant ductal dilation and concerning features of pancreatitis along with possible cholangitis/cholecystitis. GI team was consulted.  Patient underwent ERCP which could not cannulate biliary duct.  Patient underwent percutaneous biliary drainage by IR.  GI scheduled endoscopic ultrasound.   Assessment & Plan:   Principal Problem:   Jaundice Active Problems:   Essential hypertension, benign   Malignant neoplasm of upper-outer quadrant of right breast in female, estrogen receptor positive ([place])   Acute pancreatitis   Biliary obstruction: Acute pancreatitis versus metastatic lesion: Patient presented with abdominal pain, nausea, vomiting,  jaundice,  change in stool color and pruritus. CT showed  some evidence of obstruction/inflammation in the right upper quadrant area.  MRCP showed pancreatic head mass concerning for focal acute pancreatitis versus metastatic lesion.   Gastroenterology consulted.  Patient underwent ERCP which could not cannulate biliary duct. IR was consulted, patient underwent successful percutaneous drainage. 11/23 Continue to monitor CMP. Liver enzymes trending down.  Bilirubin trending up. Continue Zofran and Phenergan as needed for nausea and vomiting. Follow-up cultures. She still has significant output in the biliary drain. Total bilirubin is  trending up 7.3 >8.6 >11.2 >10.5 >9.8>10.2>10.9>11.0>12.3. Repeat CT scan abdomen pelvis showed worsening pancreatitis. Now Bilirubin trending down 9.9>6.0. Patient still has significant biliary output.   GI scheduled endoscopic ultrasound 11/29.  Right ureteral hydronephrosis: UA negative.  Urine culture not collected. Renal ultrasound shows Severe right-sided hydronephrosis compatible with high-grade stenosis. Urology is consulted, there is no need for any stenting.   Patient denies any flank pain, serum creatinine remains normal. Repeat renal ultrasound shows improving Right sided hydronephrosis. She denies any urinary symptoms.   Essential hypertension: Continue propranolol and Aldactone.  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 [name].  Microcytic anemia: H&H is stable.   AKI: Improving Baseline creatinine normal, serum creatinine up to 1.25 Avoid nephrotoxic medications,  continue iv hydration.  Hyponatremia: This could be due to dehydration.  Continue gentle IV hydration, Monitor serum sodium in the am  DVT prophylaxis: SCDs Code Status: Full code. Family Communication: No family at bed side. Disposition Plan:   Status is: Inpatient  Remains inpatient appropriate because:  ERCP could not cannulate the biliary duct.  Patient underwent successful percutaneous biliary drain. Total bilirubin is trending up.  Repeat CT abdomen pelvis showed worsening pancreatitis.  Patient still has significant biliary output.  GI schedule endoscopic ultrasound 11/30.   Consultants:  GI IR  Procedures: ERCP ,   Percutaneous biliary drain.  Antimicrobials:   Anti-infectives (From admission, onward)    Start     Dose/Rate Route Frequency Ordered Stop   02/03/21 1130  cefOXitin (MEFOXIN) 2 g in sodium chloride 0.9 % 100 mL IVPB        2 g 200 mL/hr over 30 Minutes Intravenous  Once 02/03/21 1033 02/03/21 1124         Subjective: Patient was seen and examined at  bedside. Overnight events noted.   Patient underwent percutaneous biliary drain by IR  on 11/23. She reports pain around the biliary drain area. She denies any Nausea and vomiting She still has significant amount of drain output in her biliary drain. Patient is scheduled to have endoscopic ultrasound today.   Objective: Vitals:   02/08/21 0542 02/08/21 1348 02/08/21 2149 02/09/21 0541  BP: 110/70 127/84 114/70 110/76  Pulse: 97 89 97 93  Resp: 18 16 17 17   Temp: 98.1 F (36.7 C) 97.9 F (36.6 C) 98.7 F (37.1 C) 98.1 F (36.7 C)  TempSrc: Oral Oral Oral Oral  SpO2: 96% 99% 96% 95%  Weight:      Height:        Intake/Output Summary (Last 24 hours) at 02/09/2021 1318 Last data filed at 02/09/2021 0810 Gross per 24 hour  Intake 480 ml  Output 1250 ml  Net -770 ml   Filed Weights   02/01/21 1300 02/02/21 1347  Weight: 91.2 kg 91.2 kg    Examination:  General exam: Appears comfortable, deconditioned, not in any distress. Respiratory system: CTA bilaterally. Respiratory effort normal. RR 15 Cardiovascular system: S1-S2 heard, regular rate and rhythm, no murmur. Chest: Chemo-Port noted in the left chest. Gastrointestinal system: Abdomen is soft, mildly tender around biliary drain area.  Mildly distended, BS+.   Biliary drain noted with clear yellow-colored fluid. Central nervous system: Alert and oriented x 3. No focal neurological deficits. Extremities: No edema, no cyanosis, no clubbing. Skin: No rashes, lesions or ulcers Psychiatry: Judgement and insight appear normal. Mood & affect appropriate.     Data Reviewed: I have personally reviewed following labs and imaging studies  CBC: Recent Labs  Lab 02/05/21 0444 02/06/21 0604 02/07/21 0521 02/08/21 0600 02/09/21 0532  WBC 8.5 11.6* 12.3* 13.3* 13.5*  HGB 10.2* 11.1* 10.5* 10.6* 10.9*  HCT 30.9* 34.5* 32.2* 33.0* 33.0*  MCV 100.7* 103.0* 101.3* 100.9* 100.6*   PLT 253 314 336 387 272*   Basic Metabolic Panel: Recent Labs  Lab 02/04/21 0540 02/05/21 0444 02/06/21 0604 02/07/21 0521 02/08/21 0600 02/09/21 0532  NA 135 134*  --  130* 134* 128*  K 4.2 4.0  --  4.0 3.1* 4.2  CL 102 98  --  93* 105 94*  CO2 26 27  --  28 21* 26  GLUCOSE 121* 106*  --  118* 106* 123*  BUN 16 17  --  26* 20 22*  CREATININE 1.09* 0.96  --  1.25* 0.93 1.33*  CALCIUM 9.4 9.9  --  9.9 7.7* 10.2  MG 2.0 2.1 2.2 2.2 1.9 2.4  PHOS 3.6  --   --  3.8  --  3.1   GFR: Estimated Creatinine Clearance: 59.2 mL/min (A) (by C-G formula based on SCr of 1.33 mg/dL (H)). Liver Function Tests: Recent Labs  Lab 02/05/21 0444 02/06/21 0604 02/07/21 0521 02/08/21 0600 02/09/21 0532  AST 36 35 34 29 39  ALT 33 34 30 27 40  ALKPHOS 285* 289* 248* 179* 229*  BILITOT 11.0* 12.3* 9.9* 6.0* 6.7*  PROT 7.2 7.9 7.5 6.1* 7.9  ALBUMIN 2.5* 2.5* 2.4* 1.9* 2.5*   No results for input(s): LIPASE, AMYLASE in the last 168 hours.  No results for input(s): AMMONIA in the last 168 hours. Coagulation Profile: No results for input(s): INR, PROTIME in the last 168 hours.  Cardiac Enzymes: No results for input(s): CKTOTAL, CKMB, CKMBINDEX, TROPONINI in the last 168 hours. BNP (last 3 results) No results for  input(s): PROBNP in the last 8760 hours. HbA1C: No results for input(s): HGBA1C in the last 72 hours. CBG: No results for input(s): GLUCAP in the last 168 hours. Lipid Profile: No results for input(s): CHOL, HDL, LDLCALC, TRIG, CHOLHDL, LDLDIRECT in the last 72 hours. Thyroid Function Tests: No results for input(s): TSH, T4TOTAL, FREET4, T3FREE, THYROIDAB in the last 72 hours.  Anemia Panel: No results for input(s): VITAMINB12, FOLATE, FERRITIN, TIBC, IRON, RETICCTPCT in the last 72 hours. Sepsis Labs: No results for input(s): PROCALCITON, LATICACIDVEN in the last 168 hours.  Recent Results (from the past 240 hour(s))  Resp Panel by RT-PCR (Flu A&B, Covid) Nasopharyngeal  Swab     Status: None   Collection Time: 02/01/21  6:27 PM   Specimen: Nasopharyngeal Swab; Nasopharyngeal(NP) swabs in vial transport medium  Result Value Ref Range Status   SARS Coronavirus 2 by RT PCR NEGATIVE NEGATIVE Final    Comment: (NOTE) SARS-CoV-2 target nucleic acids are NOT DETECTED.  The SARS-CoV-2 RNA is generally detectable in upper respiratory specimens during the acute phase of infection. The lowest concentration of SARS-CoV-2 viral copies this assay can detect is 138 copies/mL. A negative result does not preclude SARS-Cov-2 infection and should not be used as the sole basis for treatment or other patient management decisions. A negative result may occur with  improper specimen collection/handling, submission of specimen other than nasopharyngeal swab, presence of viral mutation(s) within the areas targeted by this assay, and inadequate number of viral copies(<138 copies/mL). A negative result must be combined with clinical observations, patient history, and epidemiological information. The expected result is Negative.  Fact Sheet for Patients:  EntrepreneurPulse.com.au  Fact Sheet for Healthcare Providers:  IncredibleEmployment.be  This test is no t yet approved or cleared by the [place] FDA and  has been authorized for detection and/or diagnosis of SARS-CoV-2 by FDA under an Emergency Use Authorization (EUA). This EUA will remain  in effect (meaning this test can be used) for the duration of the COVID-19 declaration under Section 564(b)(1) of the Act, 21 U.S.C.section 360bbb-3(b)(1), unless the authorization is terminated  or revoked sooner.       Influenza A by PCR NEGATIVE NEGATIVE Final   Influenza B by PCR NEGATIVE NEGATIVE Final    Comment: (NOTE) The Xpert Xpress SARS-CoV-2/FLU/RSV plus assay is intended as an aid in the diagnosis of influenza from Nasopharyngeal swab specimens and should not be used as a sole  basis for treatment. Nasal washings and aspirates are unacceptable for Xpert Xpress SARS-CoV-2/FLU/RSV testing.  Fact Sheet for Patients: EntrepreneurPulse.com.au  Fact Sheet for Healthcare Providers: IncredibleEmployment.be  This test is not yet approved or cleared by the [place] FDA and has been authorized for detection and/or diagnosis of SARS-CoV-2 by FDA under an Emergency Use Authorization (EUA). This EUA will remain in effect (meaning this test can be used) for the duration of the COVID-19 declaration under Section 564(b)(1) of the Act, 21 U.S.C. section 360bbb-3(b)(1), unless the authorization is terminated or revoked.  Performed at [facility], [place] [address] [place], [place] [address]   Aerobic/Anaerobic Culture w Gram Stain (surgical/deep wound)     Status: None   Collection Time: 02/03/21 11:18 AM   Specimen: BILE  Result Value Ref Range Status   Specimen Description   Final    BILE Performed at [facility] [address], [place], [place] [address]    Special Requests   Final    NONE Performed at [facility], [place] [address], [place], [place] [address]    Gram Stain   Final    RARE WBC PRESENT,BOTH PMN AND MONONUCLEAR NO ORGANISMS SEEN    Culture   Final    RARE STREPTOCOCCUS PARASANGUINIS NO ANAEROBES ISOLATED Performed at [facility], [place] [address], [place], [place] [address]    Report Status 02/08/2021 FINAL  Final   Organism ID, Bacteria STREPTOCOCCUS PARASANGUINIS  Final      Susceptibility   Streptococcus parasanguinis - MIC*    PENICILLIN 0.25 INTERMEDIATE Intermediate     CEFTRIAXONE <=0.12 SENSITIVE Sensitive     LEVOFLOXACIN >=16 RESISTANT Resistant     VANCOMYCIN 0.5 SENSITIVE Sensitive     * RARE STREPTOCOCCUS PARASANGUINIS    Radiology Studies: No results found.  Scheduled Meds:  Chlorhexidine Gluconate Cloth  6 each Topical  Daily   cholecalciferol  5,000 Units Oral q morning   multivitamin with minerals  1 tablet Oral Daily   potassium chloride  40 mEq Oral Once   propranolol  10 mg Oral BID   sodium chloride flush  5 mL Intracatheter Q8H   spironolactone  25 mg Oral BID   vitamin B-12  5,000 mcg Oral q morning   Continuous Infusions:  promethazine (PHENERGAN) injection (IM or IVPB)       LOS: 7 days    Time spent: 25 mins    Shawna Clamp, MD [facility]   If 7PM-7AM, please contact night-coverage

## 2021-02-09 NOTE — Interval H&P Note (Signed)
History and Physical Interval Note:  02/09/2021 2:45 PM  [name]  has presented today for surgery, with the diagnosis of biliary obstruction.  The various methods of treatment have been discussed with the patient and family. After consideration of risks, benefits and other options for treatment, the patient has consented to  Procedure(s): UPPER ESOPHAGEAL ENDOSCOPIC ULTRASOUND (EUS) (N/A) as a surgical intervention.  The patient's history has been reviewed, patient examined, no change in status, stable for surgery.  I have reviewed the patient's chart and labs.  Questions were answered to the patient's satisfaction.     Shaquita Fort D

## 2021-02-09 NOTE — Progress Notes (Addendum)
Referring Physician(s): Dr. Benny Lennert  Supervising Physician: Arne Cleveland  Patient Status:  [facility] - In-pt  Chief Complaint:   Abdominal pain, metastatic breast cancer, common bile duct obstruction  Subjective: Patient lying in bed with nurse at bedside.  She complains of being nauseous today.  Patient states that she is nauseous because she has not had anything by mouth preparing for endoscopy later today.  She denies fevers or chills.  She complains of tenderness and pain to insertion site.  Allergies: Patient has no known allergies.  Medications: Prior to Admission medications   Medication Sig Start Date End Date Taking? Authorizing Provider  acetaminophen (TYLENOL) 500 MG tablet Take 1,000 mg by mouth every 6 (six) hours as needed for moderate pain.   Yes [provider]  Boric Acid Vaginal 600 MG SUPP Place 600 mg vaginally daily as needed (yeast infections).   Yes [provider]  Cholecalciferol (VITAMIN D) 125 MCG (5000 UT) CAPS Take 5,000 Units by mouth every morning.   Yes [provider]  clobetasol ointment (TEMOVATE) 1.75 % Apply 1 application topically 2 (two) times daily as needed (rash/itching).   Yes [provider]  Cyanocobalamin (B-12) 5000 MCG CAPS Take 5,000 mcg by mouth every morning.   Yes [provider]  loperamide (IMODIUM) 2 MG capsule Take 2 tablets after first diarrheal bowel movement, then one tablet after each following diarrheal movement; maximum 6 tablets/ day Patient taking differently: 2-4 mg See admin instructions. Take 2 tablets  (4 mg) by mouth after first diarrheal bowel movement, then one tablet (2 mg) after each following diarrheal movement; maximum 6 tablets/ day 10/22/20  Yes Magrinat, Virgie Dad, MD  methocarbamol (ROBAXIN) 500 MG tablet Take 1 tablet (500 mg total) by mouth every 6 (six) hours as needed for muscle spasms. 12/08/20  Yes Rolm Bookbinder, MD  prochlorperazine (COMPAZINE) 10 MG  tablet Take 1 tablet (10 mg total) by mouth every 6 (six) hours as needed for nausea or vomiting. 10/22/20  Yes Magrinat, Virgie Dad, MD  propranolol (INDERAL) 10 MG tablet Take 10 mg by mouth 2 (two) times daily. 06/08/20  Yes [provider]  spironolactone (ALDACTONE) 25 MG tablet Take 25 mg by mouth 2 (two) times daily. 08/14/20  Yes [provider]  cholestyramine (QUESTRAN) 4 g packet Take 1 packet (4 g total) by mouth 2 (two) times daily for 5 days. Patient not taking: Reported on 02/01/2021 01/26/21 01/31/21  Barrie Folk, PA-C  buPROPion (WELLBUTRIN XL) 300 MG 24 hr tablet Take 1 tablet (300 mg total) by mouth every morning. 10/04/13 02/17/20  [name], MD  losartan-hydrochlorothiazide [facility]) 100-12.5 MG per tablet Take 1/2 - 1 tab daily 10/04/13 02/17/20  Zanard, Bernadene Bell, MD  telmisartan-hydrochlorothiazide (MICARDIS HCT) 80-25 MG tablet Take 1 tablet by mouth daily.  02/17/20  [provider]     Vital Signs: BP 113/78   Pulse (!) 105   Temp 97.9 F (36.6 C) (Oral)   Resp 13   Ht 5\' 10"  (1.778 m)   Wt 201 lb (91.2 kg)   SpO2 100%   BMI 28.84 kg/m   Physical Exam Constitutional:      Appearance: She is ill-appearing.  HENT:     Head: Normocephalic and atraumatic.  Cardiovascular:     Rate and Rhythm: Tachycardia present.  Pulmonary:     Effort: Pulmonary effort is normal. No respiratory distress.  Abdominal:     Tenderness: There is no abdominal  tenderness.     Comments: Biliary drain in place.  Insertion site intact sutures and StatLock in place.  Dressing is clean dry and intact.  Approximately 400 cc dark yellow output with particulate matter in gravity bag.  Skin:    General: Skin is warm and dry.  Neurological:     Mental Status: She is alert and oriented to person, place, and time.  Psychiatric:        Mood and Affect: Mood normal.        Behavior: Behavior normal.        Thought Content: Thought content normal.         Judgment: Judgment normal.    Imaging: CT ABDOMEN PELVIS WO CONTRAST  Result Date: 02/06/2021 CLINICAL DATA:  History of metastatic breast cancer with jaundice, post placement of percutaneous biliary drainage catheter on 02/03/2021 EXAM: CT ABDOMEN AND PELVIS WITHOUT CONTRAST TECHNIQUE: Multidetector CT imaging of the abdomen and pelvis was performed following the standard protocol without IV contrast. COMPARISON:  CT abdomen and pelvis-02/01/2021; 12/29/2020 MRCP-02/01/2021 image guided placement of percutaneous biliary drainage catheter-02/03/2021 FINDINGS: Lower chest: Limited visualization of the lower thorax demonstrates bibasilar subsegmental atelectasis, left greater than right. No pleural effusion. The tip of a port a catheter terminates at the level of the superior cavoatrial junction. Normal heart size. Trace amount of pericardial fluid, presumably physiologic. There is diffuse decreased attenuation of the intra cardiac blood pool suggestive of anemia. Coronary artery calcifications. Hepatobiliary: Interval placement of left hepatic lobe approach internal/external biliary drainage catheter with radiopaque side marker located at the level of the biliary hilum and distal end appropriately positioned within the descending portion of the duodenum. Evaluation for residual intrahepatic biliary ductal dilatation is degraded secondary to the lack of intravenous contrast. The gallbladder is filled with radiopaque material, likely contrast from placement of the internal/external biliary drainage catheter. No definitive gallbladder wall thickening or pericholecystic stranding on this noncontrast examination. No ascites. Pancreas: Questioned pancreatic head mass on preceding MRCP is suboptimally evaluated on this noncontrast examination (image 38, series 2), however there has been development of perihepatic ascites seen both at the level of the gastrohepatic ligament (image 29, series 2) as well as within the  adjacent ventral aspect of the abdominal mesentery (image 43, series 2). While incompletely evaluated this noncontrast examination there is no definitive organization of this fluid to suggest the presence of a pseudo cyst. Spleen: Normal noncontrast appearance of the spleen. Adrenals/Urinary Tract: Redemonstrated moderate right-sided ureterectasis and pelvicaliectasis with abrupt tapering at the mid aspect of the right ureter (image 62, series 2). Again, the etiology of this obstruction is not depicted on this examination, specifically, no radiopaque renal stones. Normal noncontrast appearance of the left kidney. No evidence of left-sided nephrolithiasis or urinary obstruction. Normal noncontrast appearance of the bilateral adrenal glands. Normal noncontrast appearance of the urinary bladder given underdistention. Stomach/Bowel: Ingested enteric contrast extends to the level of the proximal descending colon. Moderate colonic stool burden without evidence of enteric obstruction. Normal noncontrast appearance of the terminal ileum. The appendix is not visualized, however there is no pericecal inflammatory change on this noncontrast examination. No significant hiatal hernia. No pneumoperitoneum, pneumatosis or portal venous gas. Vascular/Lymphatic: Scattered atherosclerotic plaque within a normal caliber abdominal aorta. Redemonstrated scattered retroperitoneal adenopathy with index left-sided periaortic lymph node measuring 1.3 cm in greatest short axis diameter (image 44, series 2). Reproductive: Post hysterectomy. No discrete adnexal lesions. Interval development of small amount of free fluid in the pelvic cul-de-sac. Other:  There is a minimal amount of subcutaneous edema about the midline of the low back. Musculoskeletal: Redemonstrated rather extensive osseous metastatic disease with multiple predominantly sclerotic lesions seen throughout the thoracic and lumbar spine, the pelvis, the inferior tip of the right  scapula (image 12, series 2) and several right-sided ribs. No definitive evidence of impending pathologic fracture. Regional soft tissues appear normal. IMPRESSION: 1. Stable sequela of internal/external left-sided biliary drainage catheter placement. The drainage catheter is appropriately positioned with radiopaque marker at the level of the biliary hilum and distal coil located within the duodenum. Evaluation for residual intrahepatic biliary ductal dilatation is suboptimally evaluated on this noncontrast examination though both the right and left biliary systems were noted to communicate at the time of biliary drainage catheter placement on 02/03/2021. 2. Worsening peripancreatic stranding/fluid worrisome for progressive pancreatitis, incompletely evaluated on this noncontrast examination. Note, questioned pancreatic head mass on recent MRCP is suboptimally evaluated on this noncontrast abdominal CT. 3. Redemonstrated right-sided pelvicaliectasis and ureterectasis to the level of the mid aspect of the right ureter, the etiology of which is not depicted on this examination. Specifically, no radiopaque renal stones. 4. Redemonstrated extensive osseous metastatic disease and mild retroperitoneal lymphadenopathy. 5.  Aortic Atherosclerosis (ICD10-I70.0). Electronically Signed   By: Sandi Mariscal M.D.   On: 02/06/2021 15:27    Labs:  CBC: Recent Labs    02/06/21 0604 02/07/21 0521 02/08/21 0600 02/09/21 0532  WBC 11.6* 12.3* 13.3* 13.5*  HGB 11.1* 10.5* 10.6* 10.9*  HCT 34.5* 32.2* 33.0* 33.0*  PLT 314 336 387 413*    COAGS: Recent Labs    02/01/21 1418 02/02/21 0515  INR 1.1 1.0    BMP: Recent Labs    02/05/21 0444 02/07/21 0521 02/08/21 0600 02/09/21 0532  NA 134* 130* 134* 128*  K 4.0 4.0 3.1* 4.2  CL 98 93* 105 94*  CO2 27 28 21* 26  GLUCOSE 106* 118* 106* 123*  BUN 17 26* 20 22*  CALCIUM 9.9 9.9 7.7* 10.2  CREATININE 0.96 1.25* 0.93 1.33*  GFRNONAA >60 51* >60 48*    LIVER  FUNCTION TESTS: Recent Labs    02/06/21 0604 02/07/21 0521 02/08/21 0600 02/09/21 0532  BILITOT 12.3* 9.9* 6.0* 6.7*  AST 35 34 29 39  ALT 34 30 27 40  ALKPHOS 289* 248* 179* 229*  PROT 7.9 7.5 6.1* 7.9  ALBUMIN 2.5* 2.4* 1.9* 2.5*    Assessment and Plan:  Biliary drain insertion site unremarkable with sutures and StatLock in place.  Dressing is clean dry and intact with no signs or symptoms of infection.  Approximately 400 cc dark yellow output with particulate matter in gravity bag with a total of 1275 cc over the past 24 hours. WBCs no change staying around 13,000.  T bili today more elevated at 6.7 Patient afebrile and tachycardic.  Other vital signs stable. Continue flushing catheter and recording output every shift. Dressing every shift or as needed. Contact IR with questions or concerns.  Electronically Signed: Tyson Alias, NP 02/09/2021, 2:34 PM   I spent a total of 15 Minutes at the the patient's bedside AND on the patient's hospital floor or unit, greater than 50% of which was counseling/coordinating care for biliary drain placement for common bile duct obstruction.

## 2021-02-09 NOTE — Anesthesia Preprocedure Evaluation (Signed)
Anesthesia Evaluation  Patient identified by MRN, date of birth, ID band Patient awake    Reviewed: Allergy & Precautions, NPO status , Patient's Chart, lab work & pertinent test results  Airway Mallampati: II  TM Distance: >3 FB     Dental   Pulmonary former smoker,    breath sounds clear to auscultation       Cardiovascular hypertension,  Rhythm:Regular Rate:Normal     Neuro/Psych    GI/Hepatic Neg liver ROS, History noted Dr. Nyoka Cowden   Endo/Other  negative endocrine ROS  Renal/GU negative Renal ROS     Musculoskeletal  (+) Arthritis ,   Abdominal   Peds  Hematology   Anesthesia Other Findings   Reproductive/Obstetrics                             Anesthesia Physical Anesthesia Plan  ASA: 3  Anesthesia Plan: MAC   Post-op Pain Management:    Induction: Intravenous  PONV Risk Score and Plan: 2 and Ondansetron  Airway Management Planned:   Additional Equipment:   Intra-op Plan:   Post-operative Plan:   Informed Consent:   Plan Discussed with:   Anesthesia Plan Comments:         Anesthesia Quick Evaluation

## 2021-02-09 NOTE — Transfer of Care (Signed)
Immediate Anesthesia Transfer of Care Note  Patient: Ennis Forts  Procedure(s) Performed: UPPER ESOPHAGEAL ENDOSCOPIC ULTRASOUND (EUS) ESOPHAGOGASTRODUODENOSCOPY (EGD)  Patient Location: PACU  Anesthesia Type:MAC  Level of Consciousness: drowsy  Airway & Oxygen Therapy: Patient Spontanous Breathing and Patient connected to face mask oxygen  Post-op Assessment: Report given to RN, Post -op Vital signs reviewed and stable and Patient moving all extremities X 4  Post vital signs: Reviewed and stable  Last Vitals:  Vitals Value Taken Time  BP 122/82   Temp    Pulse 87 02/09/21 1531  Resp 34 02/09/21 1531  SpO2 100 % 02/09/21 1531  Vitals shown include unvalidated device data.  Last Pain:  Vitals:   02/09/21 1419  TempSrc: Oral  PainSc: 4       Patients Stated Pain Goal: 0 (53/66/44 0347)  Complications: No notable events documented.

## 2021-02-09 NOTE — Anesthesia Procedure Notes (Signed)
Procedure Name: MAC Date/Time: 02/09/2021 3:04 PM Performed by: Niel Hummer, CRNA Pre-anesthesia Checklist: Patient identified, Emergency Drugs available, Suction available and Patient being monitored Oxygen Delivery Method: Simple face mask

## 2021-02-09 NOTE — Op Note (Signed)
[facility] Patient Name: [name] Procedure Date: 02/09/2021 MRN: [id number] Attending MD: Carol Ada , MD Date of Birth: Mar 06, 1966 CSN: [id number] Age: 55 Admit Type: Inpatient Procedure:                Upper EUS Indications:              Common bile duct dilation (acquired) seen on MRCP Providers:                Carol Ada, MD, Burtis Junes, RN, Tyna Jaksch                            Technician Referring MD:              Medicines:                Propofol per Anesthesia Complications:            No immediate complications. Estimated Blood Loss:     Estimated blood loss: none. Procedure:                Pre-Anesthesia Assessment:                           - Prior to the procedure, a History and Physical                            was performed, and patient medications and                            allergies were reviewed. The patient's tolerance of                            previous anesthesia was also reviewed. The risks                            and benefits of the procedure and the sedation                            options and risks were discussed with the patient.                            All questions were answered, and informed consent                            was obtained. Prior Anticoagulants: The patient has                            taken no previous anticoagulant or antiplatelet                            agents. ASA Grade Assessment: III - A patient with                            severe systemic disease. After reviewing the risks  and benefits, the patient was deemed in                            satisfactory condition to undergo the procedure.                           - Sedation was administered by an anesthesia                            professional. Deep sedation was attained.                           After obtaining informed consent, the endoscope was                            passed under direct  vision. Throughout the                            procedure, the patient's blood pressure, pulse, and                            oxygen saturations were monitored continuously. The                            [id number] ([id number] Olympus linear ultrasound scope                            was introduced through the mouth, and advanced to                            the second part of duodenum. The upper EUS was                            accomplished without difficulty. The patient                            tolerated the procedure well. Scope In: Scope Out: Findings:      ENDOSONOGRAPHIC FINDING: :      One stent was visualized endosonographically in the common bile duct.       Extension of the stent was noted in the right intrahepatic bile duct(s).      Extensive hyperechoic material consistent with sludge was visualized       endosonographically in the gallbladder body.      An anechoic lesion suggestive of a cyst was identified in the pancreatic       head. It is not in obvious communication with the pancreatic duct. The       lesion measured 12 mm by 6 mm in maximal cross-sectional diameter. There       was a single compartment without septae. The outer wall of the lesion       was not seen. There was no associated mass. There was no internal debris       within the fluid-filled cavity.      There was no sign of significant endosonographic abnormality in the left       lobe of  the liver.      Close examination of the hepatic hilar region did not show any masses or       supicious lesions. The plastic biliary stent was identified coursing       through the CBD. There no lesions in the viewed hepatic parenchyma. A       large amount of sludge was found in the gallbladder. A pancreatic head       cyst was noted measuring 12 x 6 mm. Impression:               - One stent was visualized endosonographically in                            the common bile duct.                           -  Hyperechoic material consistent with sludge was                            visualized endosonographically in the gallbladder                            body.                           - A cystic lesion was seen in the pancreatic head.                           - No specimens collected. Moderate Sedation:      Not Applicable - Patient had care per Anesthesia. Recommendation:           - Internalize biliary stent with a removable stent,                            if possible. Procedure Code(s):        --- Professional ---                           [phone], Esophagogastroduodenoscopy, flexible,                            transoral; with endoscopic ultrasound examination                            limited to the esophagus, stomach or duodenum, and                            adjacent structures Diagnosis Code(s):        --- Professional ---                           K83.8, Other specified diseases of biliary tract                           K86.2, Cyst of pancreas CPT copyright 2019 American Medical Association. All rights reserved. The codes documented in this report are preliminary and upon coder review may  be revised to meet  current compliance requirements. Carol Ada, MD Carol Ada, MD 02/09/2021 3:47:07 PM This report has been signed electronically. Number of Addenda: 0

## 2021-02-09 NOTE — Progress Notes (Signed)
Nutrition Follow-up  INTERVENTION:   -If diet cannot be advanced beyond clear liquids by 11/30, need to consider nutrition support. Has been NPO/CLD for 6 days now.  -D/c Boost Breeze- pt not drinking  -Prosource Plus PO TID, each provides 100 kcals and 15g protein  -Multivitamin with minerals daily  Once diet advanced: -Ensure Enlive po BID, each supplement provides 350 kcal and 20 grams of protein  NUTRITION DIAGNOSIS:   Increased nutrient needs related to cancer and cancer related treatments as evidenced by estimated needs.  Ongoing.  GOAL:   Patient will meet greater than or equal to 90% of their needs  Not meeting with clears.  MONITOR:   Diet advancement, PO intake, Supplement acceptance, Labs, Weight trends, I & O's   ASSESSMENT:   55 yo female with a PMH of metastatic breast cancer was recently experiencing increasing itching and had followed up with her oncologist who did labs and found her bilirubin was increased.  It was thought that her immunotherapy  Verzenio could be causing it and was stopped.  Despite which patient's bilirubin got worse along with being symptomatic was referred to the ER.  Patient states about 4 days ago she did have episodes of nausea vomiting but denies any abdominal pain has been having decreased volume stools which are stringy. Admitted with jaundice.  11/21: admitted, [place] diet 11/22: ERCP, CLD ->NPO ->regular 11/23: biliary drain placed, NPO 11/24: CLD  Patient now NPO today for EUS. Recommend nutrition support if diet cannot be advanced (NPO/CLD x 6 days now).   Admission weight: 201 lbs. No other weights for this admission.  Medications: Vitamin D, KLOR-CON, Vitamin B-12  Labs reviewed: Low Na  Diet Order:   Diet Order             Diet NPO time specified  Diet effective now           Diet - low sodium heart healthy                   EDUCATION NEEDS:   Education needs have been addressed  Skin:  Skin  Assessment: Reviewed RN Assessment  Last BM:  11/22  Height:   Ht Readings from Last 1 Encounters:  02/02/21 5\' 10"  (1.778 m)    Weight:   Wt Readings from Last 1 Encounters:  02/02/21 91.2 kg    BMI:  Body mass index is 28.84 kg/m.  Estimated Nutritional Needs:   Kcal:  1900-2100  Protein:  115-130 grams  Fluid:  >1.9 L  Clayton Bibles, MS, RD, LDN Inpatient Clinical Dietitian Contact information available via Amion

## 2021-02-10 ENCOUNTER — Encounter (HOSPITAL_COMMUNITY): Payer: Self-pay | Admitting: Internal Medicine

## 2021-02-10 ENCOUNTER — Ambulatory Visit
Admission: RE | Admit: 2021-02-10 | Discharge: 2021-02-10 | Disposition: A | Discharge status: Home / Self Care | Payer: BC Managed Care – PPO | Source: Ambulatory Visit | Attending: Radiation Oncology | Admitting: Radiation Oncology

## 2021-02-10 ENCOUNTER — Inpatient Hospital Stay (HOSPITAL_BASED_OUTPATIENT_CLINIC_OR_DEPARTMENT_OTHER): Payer: BC Managed Care – PPO | Admitting: Oncology

## 2021-02-10 ENCOUNTER — Inpatient Hospital Stay (HOSPITAL_COMMUNITY): Payer: BC Managed Care – PPO

## 2021-02-10 DIAGNOSIS — Z170 Estrogen receptor positive status [ER+]: Secondary | ICD-10-CM

## 2021-02-10 DIAGNOSIS — C50411 Malignant neoplasm of upper-outer quadrant of right female breast: Secondary | ICD-10-CM

## 2021-02-10 DIAGNOSIS — R17 Unspecified jaundice: Secondary | ICD-10-CM | POA: Diagnosis not present

## 2021-02-10 HISTORY — PX: IR EXCHANGE BILIARY DRAIN: IMG6046

## 2021-02-10 HISTORY — PX: IR ENDOLUMINAL BX OF BILIARY TREE: IMG6053

## 2021-02-10 LAB — COMPREHENSIVE METABOLIC PANEL
ALT: 40 U/L (ref 0–44)
AST: 37 U/L (ref 15–41)
Albumin: 2.4 g/dL — ABNORMAL LOW (ref 3.5–5.0)
Alkaline Phosphatase: 192 U/L — ABNORMAL HIGH (ref 38–126)
Anion gap: 10 (ref 5–15)
BUN: 26 mg/dL — ABNORMAL HIGH (ref 6–20)
CO2: 20 mmol/L — ABNORMAL LOW (ref 22–32)
Calcium: 9.2 mg/dL (ref 8.9–10.3)
Chloride: 98 mmol/L (ref 98–111)
Creatinine, Ser: 1.35 mg/dL — ABNORMAL HIGH (ref 0.44–1.00)
GFR, Estimated: 47 mL/min — ABNORMAL LOW (ref >60)
Glucose, Bld: 127 mg/dL — ABNORMAL HIGH (ref 70–99)
Potassium: 3.9 mmol/L (ref 3.5–5.1)
Sodium: 128 mmol/L — ABNORMAL LOW (ref 135–145)
Total Bilirubin: 5.1 mg/dL — ABNORMAL HIGH (ref 0.3–1.2)
Total Protein: 7 g/dL (ref 6.5–8.1)

## 2021-02-10 LAB — MAGNESIUM: Magnesium: 2.4 mg/dL (ref 1.7–2.4)

## 2021-02-10 LAB — CBC
HCT: 32.5 % — ABNORMAL LOW (ref 36.0–46.0)
Hemoglobin: 10.5 g/dL — ABNORMAL LOW (ref 12.0–15.0)
MCH: 32.6 pg (ref 26.0–34.0)
MCHC: 32.3 g/dL (ref 30.0–36.0)
MCV: 100.9 fL — ABNORMAL HIGH (ref 80.0–100.0)
Platelets: 481 10*3/uL — ABNORMAL HIGH (ref 150–400)
RBC: 3.22 MIL/uL — ABNORMAL LOW (ref 3.87–5.11)
RDW: 13.1 % (ref 11.5–15.5)
WBC: 14.8 10*3/uL — ABNORMAL HIGH (ref 4.0–10.5)
nRBC: 0.1 % (ref 0.0–0.2)

## 2021-02-10 LAB — PHOSPHORUS: Phosphorus: 3.1 mg/dL (ref 2.5–4.6)

## 2021-02-10 MED ORDER — LIDOCAINE HCL (PF) 1 % IJ SOLN
INTRAMUSCULAR | Status: AC | PRN
  Administered 2021-02-10: 5 mL

## 2021-02-10 MED ORDER — FENTANYL CITRATE (PF) 100 MCG/2ML IJ SOLN
INTRAMUSCULAR | Status: AC
  Filled 2021-02-10: qty 2

## 2021-02-10 MED ORDER — MIDAZOLAM HCL 2 MG/2ML IJ SOLN
INTRAMUSCULAR | Status: AC
  Filled 2021-02-10: qty 2

## 2021-02-10 MED ORDER — IOHEXOL 300 MG/ML  SOLN
50.0000 mL | Freq: Once | INTRAMUSCULAR | Status: AC | PRN
  Administered 2021-02-10: 25 mL

## 2021-02-10 MED ORDER — MIDAZOLAM HCL 2 MG/2ML IJ SOLN
INTRAMUSCULAR | Status: AC | PRN
  Administered 2021-02-10 (×2): 1 mg via INTRAVENOUS

## 2021-02-10 MED ORDER — LIDOCAINE HCL (PF) 1 % IJ SOLN
INTRAMUSCULAR | Status: AC
  Filled 2021-02-10: qty 30

## 2021-02-10 MED ORDER — FENTANYL CITRATE (PF) 100 MCG/2ML IJ SOLN
INTRAMUSCULAR | Status: AC | PRN
  Administered 2021-02-10 (×2): 50 ug via INTRAVENOUS

## 2021-02-10 MED ORDER — SODIUM CHLORIDE 0.9 % IV SOLN
2.0000 g | INTRAVENOUS | Status: AC
  Administered 2021-02-10: 2 g via INTRAVENOUS
  Filled 2021-02-10: qty 20

## 2021-02-10 NOTE — Procedures (Signed)
Interventional Radiology Procedure Note  Procedure:  1) Cholangiogram 2) Biliary brush biopsy of common bile duct 3) Biliary drain exchange   Findings: Please refer to procedural dictation for full description.  Indwelling drain in unchanged position.  Persistent hilar obstructive mass.  Brush biopsy x3 at biliary confluence/proximal CBD.  Successful exchange of biliary drain with placement of 10.2 Fr biliary drain, capped.  Complications: None immediate  Estimated Blood Loss: < 5 mL  Recommendations: Place back to bag drainage with any signs of biliary obstruction, sepsis. IR available for biliary stent placement as needed. Follow Pathology results.   Ruthann Cancer, MD Pager: [phone]

## 2021-02-10 NOTE — Progress Notes (Signed)
[name] is still in the hospital. Today's visit is being rescheduled.

## 2021-02-10 NOTE — Progress Notes (Signed)
PROGRESS NOTE    [name]  NIO:[id number] DOB: [birth year] DOA: 02/01/2021  PCP: [name], MD   Brief Narrative: This 55 year old Female with PMH significant of metastatic breast cancer, Essential hypertension presented to [facility] ED with complaints of generalized pruritus. Initially It was thought to be secondary to immunotherapy Verzenio which was stopped.  Then she started having episodes of nausea,  vomiting, change in her stool color.  Work-up showed elevated total bilirubin. CT abdomen pelvis showed concerning new right upper quadrant ductal dilation and concerning features of pancreatitis along with possible cholangitis/acute cholecystitis.  Seen by GI, underwent ERCP, unable to cannulate biliary duct.  Patient underwent percutaneous biliary drainage by IR.  Post EUS by GI, Dr. Benson Norway, revealed "one stent was visualized endosonographically in the common bile duct. - Hyperechoic material consistent with sludge was visualized endosonographically in the gallbladder body. - A cystic lesion was seen in the pancreatic head."  GI recommended "- Internalize biliary stent with a removable stent, if possible."  02/10/2021: Patient was seen and examined at her bedside.  Reports diffuse abdominal pain.  8 out of 10 prior to pain medication. T bilirubin downtrending 5.1 from 6.7.   Assessment & Plan:   Principal Problem:   Jaundice Active Problems:   Essential hypertension, benign   Malignant neoplasm of upper-outer quadrant of right breast in female, estrogen receptor positive ([place])   Acute pancreatitis  Biliary obstruction, T bilirubin downtrending: Acute pancreatitis versus metastatic lesion: Patient presented with abdominal pain, nausea, vomiting,  jaundice,  change in stool color and pruritus. CT showed  some evidence of obstruction/inflammation in the right upper quadrant area.  MRCP showed pancreatic head mass concerning for focal acute pancreatitis versus metastatic lesion.    Seen by GI, underwent ERCP.  Unable to cannulate biliary duct. IR was consulted, patient underwent successful percutaneous drainage. 02/03/21 EUS done on 02/09/2021 revealed findings as stated above. Continue pain control IV antiemetics as needed T bilirubin downtrending 5.1 from 6.7.  Right ureteral hydronephrosis: UA negative.  Urine culture not collected. Renal ultrasound shows Severe right-sided hydronephrosis compatible with high-grade stenosis. Urology is consulted, there is no need for any stenting.   Patient denies any flank pain, serum creatinine remains normal. Repeat renal ultrasound shows improving Right sided hydronephrosis. She denies any urinary symptoms.   Essential hypertension: Continue propranolol and Aldactone.  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 [name].  Microcytic anemia: H&H is stable.   Worsening AKI Baseline creatinine 0.9 Creatinine uptrending 1.35. Continue to avoid nephrotoxic agents, dehydration and hypotension. Continue IV fluid hydration  Monitor urine output with strict I's and O's  Hyponatremia, hypovolemic: Serum sodium 128 from 134 This could be due to low solute intake. Continue normal saline at 50 cc/h Repeat BMP in the morning.  DVT prophylaxis: SCDs Code Status: Full code. Family Communication: No family at bed side. Disposition Plan:   Status is: Inpatient  Remains inpatient appropriate because:  ERCP could not cannulate the biliary duct.  Patient underwent successful percutaneous biliary drain. Total bilirubin is trending up.  Repeat CT abdomen pelvis showed worsening pancreatitis.    Consultants:  GI IR  Procedures: ERCP ,   Percutaneous biliary drain.  Antimicrobials:   Anti-infectives (From admission, onward)    Start     Dose/Rate Route Frequency Ordered Stop   02/03/21 1130  cefOXitin (MEFOXIN) 2 g in sodium chloride 0.9 % 100 mL IVPB        2 g 200  mL/hr over 30  Minutes Intravenous  Once 02/03/21 1033 02/03/21 1124        Subjective: General: Well-developed well-nourished in no acute distress.  She is alert and oriented x3. Cardiovascular: Regular rate and rhythm no rubs or gallops.  No JVD or thyromegaly noted. Respiratory: Clear to auscultation with no wheezes or rales.  Good inspiratory effort. GI: Diffuse tenderness to palpation.  Bowel sounds present.  Nondistended. Musculoskeletal: No lower extremity edema bilaterally. Psych: Mood is appropriate for condition and setting. Neuro: Moves all 4 extremities equally.  Nonfocal. Skin: No ulcerative lesions or rashes.  Objective: Vitals:   02/09/21 1827 02/09/21 2227 02/10/21 0531 02/10/21 1223  BP: 105/60 114/82 109/72 115/78  Pulse: (!) 103 93 91 87  Resp: 17 16 18    Temp: 98.5 F (36.9 C) 98.7 F (37.1 C) (!) 97.3 F (36.3 C) 98.1 F (36.7 C)  TempSrc: Oral Oral Oral Oral  SpO2: 99% 100% 100% 100%  Weight:      Height:        Intake/Output Summary (Last 24 hours) at 02/10/2021 1336 Last data filed at 02/10/2021 1206 Gross per 24 hour  Intake 1270.66 ml  Output 700 ml  Net 570.66 ml   Filed Weights   02/01/21 1300 02/02/21 1347  Weight: 91.2 kg 91.2 kg    Examination:  General exam: Appears comfortable, deconditioned, not in any distress. Respiratory system: CTA bilaterally. Respiratory effort normal. RR 15 Cardiovascular system: S1-S2 heard, regular rate and rhythm, no murmur. Chest: Chemo-Port noted in the left chest. Gastrointestinal system: Abdomen is soft, mildly tender around biliary drain area.  Mildly distended, BS+.   Biliary drain noted with clear yellow-colored fluid. Central nervous system: Alert and oriented x 3. No focal neurological deficits. Extremities: No edema, no cyanosis, no clubbing. Skin: No rashes, lesions or ulcers Psychiatry: Judgement and insight appear normal. Mood & affect appropriate.     Data Reviewed: I have personally reviewed  following labs and imaging studies  CBC: Recent Labs  Lab 02/06/21 0604 02/07/21 0521 02/08/21 0600 02/09/21 0532 02/10/21 0505  WBC 11.6* 12.3* 13.3* 13.5* 14.8*  HGB 11.1* 10.5* 10.6* 10.9* 10.5*  HCT 34.5* 32.2* 33.0* 33.0* 32.5*  MCV 103.0* 101.3* 100.9* 100.6* 100.9*  PLT 314 336 387 413* 151*   Basic Metabolic Panel: Recent Labs  Lab 02/04/21 0540 02/05/21 0444 02/06/21 0604 02/07/21 0521 02/08/21 0600 02/09/21 0532 02/10/21 0505  NA 135 134*  --  130* 134* 128* 128*  K 4.2 4.0  --  4.0 3.1* 4.2 3.9  CL 102 98  --  93* 105 94* 98  CO2 26 27  --  28 21* 26 20*  GLUCOSE 121* 106*  --  118* 106* 123* 127*  BUN 16 17  --  26* 20 22* 26*  CREATININE 1.09* 0.96  --  1.25* 0.93 1.33* 1.35*  CALCIUM 9.4 9.9  --  9.9 7.7* 10.2 9.2  MG 2.0 2.1 2.2 2.2 1.9 2.4 2.4  PHOS 3.6  --   --  3.8  --  3.1 3.1   GFR: Estimated Creatinine Clearance: 58.4 mL/min (A) (by C-G formula based on SCr of 1.35 mg/dL (H)). Liver Function Tests: Recent Labs  Lab 02/06/21 0604 02/07/21 0521 02/08/21 0600 02/09/21 0532 02/10/21 0505  AST 35 34 29 39 37  ALT 34 30 27 40 40  ALKPHOS 289* 248* 179* 229* 192*  BILITOT 12.3* 9.9* 6.0* 6.7* 5.1*  PROT 7.9 7.5 6.1* 7.9 7.0  ALBUMIN 2.5*  2.4* 1.9* 2.5* 2.4*   No results for input(s): LIPASE, AMYLASE in the last 168 hours.  No results for input(s): AMMONIA in the last 168 hours. Coagulation Profile: No results for input(s): INR, PROTIME in the last 168 hours.  Cardiac Enzymes: No results for input(s): CKTOTAL, CKMB, CKMBINDEX, TROPONINI in the last 168 hours. BNP (last 3 results) No results for input(s): PROBNP in the last 8760 hours. HbA1C: No results for input(s): HGBA1C in the last 72 hours. CBG: No results for input(s): GLUCAP in the last 168 hours. Lipid Profile: No results for input(s): CHOL, HDL, LDLCALC, TRIG, CHOLHDL, LDLDIRECT in the last 72 hours. Thyroid Function Tests: No results for input(s): TSH, T4TOTAL, FREET4,  T3FREE, THYROIDAB in the last 72 hours.  Anemia Panel: No results for input(s): VITAMINB12, FOLATE, FERRITIN, TIBC, IRON, RETICCTPCT in the last 72 hours. Sepsis Labs: No results for input(s): PROCALCITON, LATICACIDVEN in the last 168 hours.  Recent Results (from the past 240 hour(s))  Resp Panel by RT-PCR (Flu A&B, Covid) Nasopharyngeal Swab     Status: None   Collection Time: 02/01/21  6:27 PM   Specimen: Nasopharyngeal Swab; Nasopharyngeal(NP) swabs in vial transport medium  Result Value Ref Range Status   SARS Coronavirus 2 by RT PCR NEGATIVE NEGATIVE Final    Comment: (NOTE) SARS-CoV-2 target nucleic acids are NOT DETECTED.  The SARS-CoV-2 RNA is generally detectable in upper respiratory specimens during the acute phase of infection. The lowest concentration of SARS-CoV-2 viral copies this assay can detect is 138 copies/mL. A negative result does not preclude SARS-Cov-2 infection and should not be used as the sole basis for treatment or other patient management decisions. A negative result may occur with  improper specimen collection/handling, submission of specimen other than nasopharyngeal swab, presence of viral mutation(s) within the areas targeted by this assay, and inadequate number of viral copies(<138 copies/mL). A negative result must be combined with clinical observations, patient history, and epidemiological information. The expected result is Negative.  Fact Sheet for Patients:  EntrepreneurPulse.com.au  Fact Sheet for Healthcare Providers:  IncredibleEmployment.be  This test is no t yet approved or cleared by the [place] FDA and  has been authorized for detection and/or diagnosis of SARS-CoV-2 by FDA under an Emergency Use Authorization (EUA). This EUA will remain  in effect (meaning this test can be used) for the duration of the COVID-19 declaration under Section 564(b)(1) of the Act, 21 U.S.C.section 360bbb-3(b)(1),  unless the authorization is terminated  or revoked sooner.       Influenza A by PCR NEGATIVE NEGATIVE Final   Influenza B by PCR NEGATIVE NEGATIVE Final    Comment: (NOTE) The Xpert Xpress SARS-CoV-2/FLU/RSV plus assay is intended as an aid in the diagnosis of influenza from Nasopharyngeal swab specimens and should not be used as a sole basis for treatment. Nasal washings and aspirates are unacceptable for Xpert Xpress SARS-CoV-2/FLU/RSV testing.  Fact Sheet for Patients: EntrepreneurPulse.com.au  Fact Sheet for Healthcare Providers: IncredibleEmployment.be  This test is not yet approved or cleared by the [place] FDA and has been authorized for detection and/or diagnosis of SARS-CoV-2 by FDA under an Emergency Use Authorization (EUA). This EUA will remain in effect (meaning this test can be used) for the duration of the COVID-19 declaration under Section 564(b)(1) of the Act, 21 U.S.C. section 360bbb-3(b)(1), unless the authorization is terminated or revoked.  Performed at [facility], [place] [address] [place], [place] [address]   Aerobic/Anaerobic Culture w Gram Stain (surgical/deep  wound)     Status: None   Collection Time: 02/03/21 11:18 AM   Specimen: BILE  Result Value Ref Range Status   Specimen Description   Final    BILE Performed at [place] [address] [place],  [address]    Special Requests   Final    NONE Performed at [facility], [place] [address] [place],  [address]    Gram Stain   Final    RARE WBC PRESENT,BOTH PMN AND MONONUCLEAR NO ORGANISMS SEEN    Culture   Final    RARE STREPTOCOCCUS PARASANGUINIS NO ANAEROBES ISOLATED Performed at [facility], [place] [address], [place],  [address]    Report Status 02/08/2021 FINAL  Final   Organism ID, Bacteria STREPTOCOCCUS PARASANGUINIS  Final      Susceptibility    Streptococcus parasanguinis - MIC*    PENICILLIN 0.25 INTERMEDIATE Intermediate     CEFTRIAXONE <=0.12 SENSITIVE Sensitive     LEVOFLOXACIN >=16 RESISTANT Resistant     VANCOMYCIN 0.5 SENSITIVE Sensitive     * RARE STREPTOCOCCUS PARASANGUINIS    Radiology Studies: No results found.  Scheduled Meds:  Chlorhexidine Gluconate Cloth  6 each Topical Daily   cholecalciferol  5,000 Units Oral q morning   multivitamin with minerals  1 tablet Oral Daily   potassium chloride  40 mEq Oral Once   propranolol  10 mg Oral BID   sodium chloride flush  5 mL Intracatheter Q8H   spironolactone  25 mg Oral BID   vitamin B-12  5,000 mcg Oral q morning   Continuous Infusions:  sodium chloride 50 mL/hr at 02/10/21 0557   promethazine (PHENERGAN) injection (IM or IVPB)       LOS: 8 days    Time spent: 25 mins    Kayleen Memos, MD [facility]   If 7PM-7AM, please contact night-coverage

## 2021-02-10 NOTE — Consult Note (Signed)
Chief Complaint: Patient was seen in consultation today for image guided brush biopsy of CBD by bifurcation and possible biliary drain internalization Chief Complaint  Patient presents with   Shortness of Breath   Jaundice   Pruritis   at the request of Carol Ada   Referring Physician(s): Carol Ada   Supervising Physician: Ruthann Cancer  Patient Status: [facility] - In-pt  History of Present Illness: [name] is a 55 y.o. female with PMHs of HTN, metastatic breast cancer who presented to ED due to generalized pruritus, found to have hyperbilirubinemia.  CT abdomen pelvis showed intrahepatic biliary dilation dilation and abnormal enhancement and wall thickening in the common hepatic duct and common bile duct, along with gallbladder wall thickening and gallbladder sludge. Subsequent MRI showed:   3.6 cm masslike area in the pancreatic head with adjacent peripancreatic edema, which also causes diffuse biliary and pancreatic ductal dilatation. Rapid appearance since prior CT approximately 1 month ago is suggestive of focal pancreatitis, although metastatic disease cannot be excluded. Moderate right hydronephrosis with soft tissue prominence involving the proximal right ureter. These findings may also be secondary to acute pancreatitis, although metastatic disease cannot be excluded.   Patient underwent ERCP but bile duct could not be cannulated, patient underwent percutaneous int/ext biliary drain placement with IR on 02/03/21. Patient's T bili has been trending down since the drain placement, and patient underwent EUS with Dr. Benson Norway yesterday.   IR was requested for brush biopsy and possible internalization of the biliary drain by Dr. Benson Norway.  Case was reviewed and approved by Dr. Serafina Royals.   Patient laying in bed, not in acute distress.  Reports chronic abdominal pain and N/v.  Denise headache, fever, chills, shortness of breath, cough, chest pain,  and bleeding.   Past  Medical History:  Diagnosis Date   Allergy    Arthritis 2018   hands, left knee   Breast cancer ([place]) 03-27-2020   right breast IMC   Eczema    Family history of breast cancer    Family history of lung cancer    Family history of prostate cancer    Hypertension     Past Surgical History:  Procedure Laterality Date   ABDOMINAL HYSTERECTOMY     CESAREAN SECTION     ERCP N/A 02/02/2021   Procedure: ENDOSCOPIC RETROGRADE CHOLANGIOPANCREATOGRAPHY (ERCP);  Surgeon: Carol Ada, MD;  Location: [facility];  Service: Endoscopy;  Laterality: N/A;   IR BILIARY DRAIN PLACEMENT WITH CHOLANGIOGRAM  02/03/2021   KNEE ARTHROSCOPY W/ ACL RECONSTRUCTION Left    KNEE SURGERY     MODIFIED MASTECTOMY Right 12/07/2020   Procedure: RIGHT MODIFIED RADICAL MASTECTOMY;  Surgeon: Rolm Bookbinder, MD;  Location: [facility];  Service: General;  Laterality: Right;   PORTACATH PLACEMENT Left 04/23/2020   Procedure: LEFT SIDE PORT PLACEMENT WITH ULTRASOUND GUIDANCE;  Surgeon: Rolm Bookbinder, MD;  Location: [facility];  Service: General;  Laterality: Left;  230PM START TIME PLEASE ROOM 2 THANKS   SPHINCTEROTOMY  02/02/2021   Procedure: SPHINCTEROTOMY;  Surgeon: Carol Ada, MD;  Location: [facility];  Service: Endoscopy;;   TUBAL LIGATION      Allergies: Patient has no known allergies.  Medications: Prior to Admission medications   Medication Sig Start Date End Date Taking? Authorizing Provider  acetaminophen (TYLENOL) 500 MG tablet Take 1,000 mg by mouth every 6 (six) hours as needed for moderate pain.   Yes [provider]  Boric Acid Vaginal 600 MG SUPP Place  600 mg vaginally daily as needed (yeast infections).   Yes [provider]  Cholecalciferol (VITAMIN D) 125 MCG (5000 UT) CAPS Take 5,000 Units by mouth every morning.   Yes [provider]  clobetasol ointment (TEMOVATE) 0.10 % Apply 1 application topically 2 (two) times daily as  needed (rash/itching).   Yes [provider]  Cyanocobalamin (B-12) 5000 MCG CAPS Take 5,000 mcg by mouth every morning.   Yes [provider]  loperamide (IMODIUM) 2 MG capsule Take 2 tablets after first diarrheal bowel movement, then one tablet after each following diarrheal movement; maximum 6 tablets/ day Patient taking differently: 2-4 mg See admin instructions. Take 2 tablets  (4 mg) by mouth after first diarrheal bowel movement, then one tablet (2 mg) after each following diarrheal movement; maximum 6 tablets/ day 10/22/20  Yes Magrinat, Virgie Dad, MD  methocarbamol (ROBAXIN) 500 MG tablet Take 1 tablet (500 mg total) by mouth every 6 (six) hours as needed for muscle spasms. 12/08/20  Yes Rolm Bookbinder, MD  prochlorperazine (COMPAZINE) 10 MG tablet Take 1 tablet (10 mg total) by mouth every 6 (six) hours as needed for nausea or vomiting. 10/22/20  Yes Magrinat, Virgie Dad, MD  propranolol (INDERAL) 10 MG tablet Take 10 mg by mouth 2 (two) times daily. 06/08/20  Yes [provider]  spironolactone (ALDACTONE) 25 MG tablet Take 25 mg by mouth 2 (two) times daily. 08/14/20  Yes [provider]  cholestyramine (QUESTRAN) 4 g packet Take 1 packet (4 g total) by mouth 2 (two) times daily for 5 days. Patient not taking: Reported on 02/01/2021 01/26/21 01/31/21  Barrie Folk, PA-C  buPROPion (WELLBUTRIN XL) 300 MG 24 hr tablet Take 1 tablet (300 mg total) by mouth every morning. 10/04/13 02/17/20  [name], MD  losartan-hydrochlorothiazide [facility]) 100-12.5 MG per tablet Take 1/2 - 1 tab daily 10/04/13 02/17/20  Zanard, Bernadene Bell, MD  telmisartan-hydrochlorothiazide (MICARDIS HCT) 80-25 MG tablet Take 1 tablet by mouth daily.  02/17/20  [provider]     Family History  Problem Relation Age of Onset   Breast cancer Mother 87   Arthritis Mother    Diabetes Father    Kidney disease Father    Hypertension Sister    Heart disease Maternal  Grandmother    Breast cancer Paternal Grandmother        dx >50   Heart disease Maternal Aunt    Breast cancer Maternal Aunt 77   Other Maternal Aunt        brain tumor (not cancerous)   Lung cancer Maternal Aunt    Prostate cancer Cousin 74       localized    Social History   Socioeconomic History   Marital status: Divorced    Spouse name: Not on file   Number of children: Not on file   Years of education: Not on file   Highest education level: Not on file  Occupational History   Not on file  Tobacco Use   Smoking status: Former    Packs/day: 0.25    Years: 36.00    Pack years: 9.00    Types: Cigarettes    Quit date: 04/22/2017    Years since quitting: 3.8   Smokeless tobacco: Never   Tobacco comments:    She is trying to quit.    Vaping Use   Vaping Use: Never used  Substance and Sexual Activity   Alcohol use: Yes    Alcohol/week: 3.0 standard  drinks    Types: 3 Glasses of wine per week    Comment: Occasional    Drug use: Never   Sexual activity: Yes    Partners: Male    Birth control/protection: Surgical  Other Topics Concern   Not on file  Social History Narrative   Marital Status: Divorced    Children:  Sons (2)    Pets: None   Living Situation: Lives alone   Occupation: Wellsite geologist (Financial trader)     Education: Programmer, systems    Tobacco Use/Exposure:  She has smoked as much 1 ppd.  She has smoked for > 20 years.  She is trying to quit and is down to about 3-5 cigs per day.     Alcohol Use: 4 days/months  (3 drinks)    Drug Use:  None   Diet:  Regular   Exercise:  Walking/ Zumba/Basketball, Biking   Hobbies: Dancing/ Cooking                Social Determinants of Health   Financial Resource Strain: Not on file  Food Insecurity: No Food Insecurity   Worried About Charity fundraiser in the Last Year: Never true   Ran Out of Food in the Last Year: Never true  Transportation Needs: No Transportation Needs   Lack of Transportation  (Medical): No   Lack of Transportation (Non-Medical): No  Physical Activity: Not on file  Stress: Not on file  Social Connections: Not on file     Review of Systems: A 12 point ROS discussed and pertinent positives are indicated in the HPI above.  All other systems are negative.  Vital Signs: BP 109/72 (BP Location: Left Arm)   Pulse 91   Temp (!) 97.3 F (36.3 C) (Oral)   Resp 18   Ht 5' 10" (1.778 m)   Wt 201 lb (91.2 kg)   SpO2 100%   BMI 28.84 kg/m    Physical Exam Vitals reviewed.  Constitutional:      General: She is not in acute distress.    Appearance: She is well-developed. She is not ill-appearing.  HENT:     Head: Normocephalic and atraumatic.     Mouth/Throat:     Mouth: Mucous membranes are moist.  Cardiovascular:     Rate and Rhythm: Normal rate and regular rhythm.     Heart sounds: Normal heart sounds.  Pulmonary:     Effort: Pulmonary effort is normal.     Breath sounds: Normal breath sounds.  Abdominal:     Palpations: Abdomen is soft.  Skin:    General: Skin is warm and dry.     Coloration: Skin is not cyanotic or pale.     Comments: Positive mid upper abd drain to a gravity bag. Site is unremarkable with no erythema, edema, tenderness, bleeding or drainage. Suture and stat lock in place. Dressing is clean, dry, and intact. 150 ml of  clear tea colored fluid noted in the bag.  Neurological:     Mental Status: She is alert and oriented to person, place, and time.  Psychiatric:        Mood and Affect: Mood normal.        Behavior: Behavior normal.    MD Evaluation Airway: WNL Heart: WNL Abdomen: Other (comments) Abdomen comments: Tender at the drainage site Chest/ Lungs: WNL Other Pertinent Findings: Jaundiced, scleral icterus ASA  Classification: 3 Mallampati/Airway Score: Two  Imaging: CT ABDOMEN PELVIS WO CONTRAST  Result Date:  02/06/2021 CLINICAL DATA:  History of metastatic breast cancer with jaundice, post placement of  percutaneous biliary drainage catheter on 02/03/2021 EXAM: CT ABDOMEN AND PELVIS WITHOUT CONTRAST TECHNIQUE: Multidetector CT imaging of the abdomen and pelvis was performed following the standard protocol without IV contrast. COMPARISON:  CT abdomen and pelvis-02/01/2021; 12/29/2020 MRCP-02/01/2021 image guided placement of percutaneous biliary drainage catheter-02/03/2021 FINDINGS: Lower chest: Limited visualization of the lower thorax demonstrates bibasilar subsegmental atelectasis, left greater than right. No pleural effusion. The tip of a port a catheter terminates at the level of the superior cavoatrial junction. Normal heart size. Trace amount of pericardial fluid, presumably physiologic. There is diffuse decreased attenuation of the intra cardiac blood pool suggestive of anemia. Coronary artery calcifications. Hepatobiliary: Interval placement of left hepatic lobe approach internal/external biliary drainage catheter with radiopaque side marker located at the level of the biliary hilum and distal end appropriately positioned within the descending portion of the duodenum. Evaluation for residual intrahepatic biliary ductal dilatation is degraded secondary to the lack of intravenous contrast. The gallbladder is filled with radiopaque material, likely contrast from placement of the internal/external biliary drainage catheter. No definitive gallbladder wall thickening or pericholecystic stranding on this noncontrast examination. No ascites. Pancreas: Questioned pancreatic head mass on preceding MRCP is suboptimally evaluated on this noncontrast examination (image 38, series 2), however there has been development of perihepatic ascites seen both at the level of the gastrohepatic ligament (image 29, series 2) as well as within the adjacent ventral aspect of the abdominal mesentery (image 43, series 2). While incompletely evaluated this noncontrast examination there is no definitive organization of this fluid to  suggest the presence of a pseudo cyst. Spleen: Normal noncontrast appearance of the spleen. Adrenals/Urinary Tract: Redemonstrated moderate right-sided ureterectasis and pelvicaliectasis with abrupt tapering at the mid aspect of the right ureter (image 62, series 2). Again, the etiology of this obstruction is not depicted on this examination, specifically, no radiopaque renal stones. Normal noncontrast appearance of the left kidney. No evidence of left-sided nephrolithiasis or urinary obstruction. Normal noncontrast appearance of the bilateral adrenal glands. Normal noncontrast appearance of the urinary bladder given underdistention. Stomach/Bowel: Ingested enteric contrast extends to the level of the proximal descending colon. Moderate colonic stool burden without evidence of enteric obstruction. Normal noncontrast appearance of the terminal ileum. The appendix is not visualized, however there is no pericecal inflammatory change on this noncontrast examination. No significant hiatal hernia. No pneumoperitoneum, pneumatosis or portal venous gas. Vascular/Lymphatic: Scattered atherosclerotic plaque within a normal caliber abdominal aorta. Redemonstrated scattered retroperitoneal adenopathy with index left-sided periaortic lymph node measuring 1.3 cm in greatest short axis diameter (image 44, series 2). Reproductive: Post hysterectomy. No discrete adnexal lesions. Interval development of small amount of free fluid in the pelvic cul-de-sac. Other: There is a minimal amount of subcutaneous edema about the midline of the low back. Musculoskeletal: Redemonstrated rather extensive osseous metastatic disease with multiple predominantly sclerotic lesions seen throughout the thoracic and lumbar spine, the pelvis, the inferior tip of the right scapula (image 12, series 2) and several right-sided ribs. No definitive evidence of impending pathologic fracture. Regional soft tissues appear normal. IMPRESSION: 1. Stable sequela of  internal/external left-sided biliary drainage catheter placement. The drainage catheter is appropriately positioned with radiopaque marker at the level of the biliary hilum and distal coil located within the duodenum. Evaluation for residual intrahepatic biliary ductal dilatation is suboptimally evaluated on this noncontrast examination though both the right and left biliary systems were noted to communicate at the  time of biliary drainage catheter placement on 02/03/2021. 2. Worsening peripancreatic stranding/fluid worrisome for progressive pancreatitis, incompletely evaluated on this noncontrast examination. Note, questioned pancreatic head mass on recent MRCP is suboptimally evaluated on this noncontrast abdominal CT. 3. Redemonstrated right-sided pelvicaliectasis and ureterectasis to the level of the mid aspect of the right ureter, the etiology of which is not depicted on this examination. Specifically, no radiopaque renal stones. 4. Redemonstrated extensive osseous metastatic disease and mild retroperitoneal lymphadenopathy. 5.  Aortic Atherosclerosis (ICD10-I70.0). Electronically Signed   By: Sandi Mariscal M.D.   On: 02/06/2021 15:27   CT Abdomen Pelvis W Contrast  Result Date: 02/01/2021 CLINICAL DATA:  Itching in jaundice for 7 days. Shortness of breath. Dark colored urine. Nausea. Radiation therapy yesterday. History of breast cancer with osseous metastatic disease. EXAM: CT ABDOMEN AND PELVIS WITH CONTRAST TECHNIQUE: Multidetector CT imaging of the abdomen and pelvis was performed using the standard protocol following bolus administration of intravenous contrast. CONTRAST:  57m OMNIPAQUE IOHEXOL 350 MG/ML SOLN COMPARISON:  12/29/2020 FINDINGS: Lower chest: Right mastectomy. Stable scarring in the right lower lobe. Hepatobiliary: New substantial intrahepatic biliary dilatation observed along with gallbladder wall thickening and high density material in the gallbladder which could be from sludge,  proteinaceous fluid, or blood products. There is narrowing and substantial wall enhancement in the right and left hepatic ducts as they converge, raising the possibility of cholangitis. Ill definition of the common bile duct probably from wall thickening. No well-defined stones. Pancreas: Stranding around the pancreatic head along with new mildly dilated dorsal pancreatic duct with truncation of caliber in the pancreatic head region, appearance raises some suspicion for focal pancreatitis. I do not see a specific well-defined mass in the pancreatic head. Spleen: Unremarkable Adrenals/Urinary Tract: New abnormal hydronephrosis and proximal hydroureter extending down to a 8.5 cm dilated segment of the right distal ureter which contains high internal density; this is new compared to the 12/29/2020 exam and accordingly raises suspicion for a blood clot rather than tumor. Mildly delayed nephrogram on the right. No well-defined stone. Adrenal glands unremarkable. Urinary bladder unremarkable. Stomach/Bowel: No dilated bowel observed.  Normal appendix. Vascular/Lymphatic: Left periaortic lymph node 1.3 cm in short axis on image 42 series 2, previously 1.9 cm. Other retroperitoneal lymph nodes are likewise improved compared to previous. Reproductive: Uterus absent.  Adnexa unremarkable. Other: Stable mild retroperitoneal stranding. Musculoskeletal: Stable widespread scattered sclerotic lesions indicative of prior osseous metastatic disease. Small umbilical hernia contains adipose tissue. IMPRESSION: 1. Since 12/29/2020, there is new substantial intrahepatic biliary dilatation along with dorsal pancreatic duct dilatation. There seems to be abnormal enhancement and wall thickening in the common hepatic duct and common bile duct, along with gallbladder wall thickening and gallbladder sludge. In addition there is some stranding around the pancreatic head with truncation of the dilated dorsal pancreatic duct. Possibilities may  include cholangitis and/or focal pancreatitis involving the pancreatic head. Acute cholecystitis not excluded. A discrete pancreatic head mass is not well seen. 2. In addition, there is new substantial right hydronephrosis and delayed nephrogram on the right extending down to a tapering plug of high density material in the proximal ureter, presumably representing a blood clot in the ureter given that the kidney and ureter had a normal appearance 1 month ago. Tumor in the ureter seems unlikely in this time frame. Severe ureteritis might be a differential diagnostic consideration. 3. Mildly reduced retroperitoneal adenopathy, although the retroperitoneal stranding continues. 4. Stable widespread scattered sclerotic lesions indicative of prior metastatic disease. Electronically Signed  By: Van Clines M.D.   On: 02/01/2021 16:31   US RENAL  Result Date: 02/04/2021 CLINICAL DATA:  55 year old female for follow-up of hydronephrosis. EXAM: RENAL / URINARY TRACT ULTRASOUND COMPLETE COMPARISON:  02/02/2021 FINDINGS: Right Kidney: Renal measurements: 10.8 x 7.1 x 7.6 cm = volume: 303 mL. RIGHT hydronephrosis appears slightly improved since 02/02/2021, now moderate to severe. No renal mass is identified. Renal echogenicity is UPPER limits of normal. Left Kidney: Renal measurements: 10.9 x 6.5 x 6.9 cm = volume: 253 mL. Mild LEFT hydronephrosis is not significantly changed. No renal mass identified. Renal echogenicity is UPPER limits of normal. Bladder: RIGHT renal jet is again not visualized. A normal LEFT ureteral jet is present. No other abnormalities noted. Other: None. IMPRESSION: 1. Apparent slight improvement in RIGHT hydronephrosis, now moderate to severe. RIGHT ureteral jet again not visualized compatible with high-grade obstruction. 2. Unchanged mild LEFT hydronephrosis. 3. No other significant change. Electronically Signed   By: Margarette Canada M.D.   On: 02/04/2021 10:35   US RENAL  Result Date:  02/02/2021 CLINICAL DATA:  Hydronephrosis EXAM: RENAL / URINARY TRACT ULTRASOUND COMPLETE COMPARISON:  02/01/2021 FINDINGS: Right Kidney: Renal measurements: 6.2 x 11.2 x 5.9 cm = volume: 217 mL. Normal right renal cortical echotexture. There is severe right-sided hydronephrosis unchanged since recent CT and MRI. No evidence of nephrolithiasis. Left Kidney: Renal measurements: 5.7 x 11.3 x 4.7 cm = volume: 159 mL. Normal left renal cortical echotexture. There is mild left-sided hydronephrosis, increased since recent ultrasound. No nephrolithiasis. Bladder: A left ureteral jet is identified. The right ureteral jet is not seen. No filling defects or bladder wall thickening. Other: None. IMPRESSION: 1. Severe right-sided hydronephrosis, not appreciably changed since recent CT. There is no right ureteral jet identified within the bladder, compatible with high-grade obstruction. 2. Mild left-sided hydronephrosis, increased since prior study. 3. No evidence of nephrolithiasis. Electronically Signed   By: Randa Ngo M.D.   On: 02/02/2021 20:41   MR ABDOMEN MRCP WO CONTRAST  Result Date: 02/01/2021 CLINICAL DATA:  Jaundice. Biliary ductal dilatation and right hydronephrosis on recent CT. Metastatic breast carcinoma. EXAM: MRI ABDOMEN WITHOUT CONTRAST  (INCLUDING MRCP) TECHNIQUE: Multiplanar multisequence MR imaging of the abdomen was performed. Heavily T2-weighted images of the biliary and pancreatic ducts were obtained, and three-dimensional MRCP images were rendered by post processing. COMPARISON:  CT on 02/01/2021 FINDINGS: Lower chest: No acute findings. Hepatobiliary: No hepatic masses visualized on this unenhanced exam. Diffuse dilatation of the intrahepatic bile ducts is seen due to stricture of the common hepatic duct in the liver hilum. No evidence of cholecystitis. Pancreas: Pancreatic ductal dilatation is seen with obstruction in the pancreatic head. A solid T2 hypointense mass is seen in the pancreatic  head measuring 3.6 x 3.6 cm on image 22/4, which is new since earlier CT on 12/29/2020. Adjacent soft tissue edema is seen within the porta hepatis and throughout the peripancreatic region. This rapid appearance in the past month is suggestive of focal pancreatitis, with metastatic disease or primary pancreatic carcinoma considered less likely. Spleen:  Within normal limits in size. Adrenals/Urinary tract: No evidence of renal mass or abscess. Moderate right hydronephrosis is also new since 12/29/2020, with soft tissue thickening seen involving the proximal right ureter, with adjacent retroperitoneal edema. These findings may be secondary to pancreatitis, with metastatic disease considered less likely. Stomach/Bowel: No evidence of obstruction or wall thickening. Vascular/Lymphatic: Mild retroperitoneal lymphadenopathy is seen with largest lymph node measuring 1.5 cm on image 27/4. This  shows mild decrease from 2.4 cm on earlier CT of 12/29/2020. Other:  None. 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 IMPRESSION: 3.6 cm masslike area in the pancreatic head with adjacent peripancreatic edema, which also causes diffuse biliary and pancreatic ductal dilatation. Rapid appearance since prior CT approximately 1 month ago is suggestive of focal pancreatitis, although metastatic disease cannot be excluded. Moderate right hydronephrosis with soft tissue prominence involving the proximal right ureter. These findings may also be secondary to acute pancreatitis, although metastatic disease cannot be excluded. Mild decrease in retroperitoneal lymphadenopathy since earlier CT on 10/18/202222. Diffuse bone metastases. Electronically Signed   By: Marlaine Hind M.D.   On: 02/01/2021 21:54   MR 3D Recon At Scanner  Result Date: 02/01/2021 CLINICAL DATA:  Jaundice. Biliary ductal dilatation and right hydronephrosis on recent CT. Metastatic breast carcinoma. EXAM: MRI ABDOMEN WITHOUT  CONTRAST  (INCLUDING MRCP) TECHNIQUE: Multiplanar multisequence MR imaging of the abdomen was performed. Heavily T2-weighted images of the biliary and pancreatic ducts were obtained, and three-dimensional MRCP images were rendered by post processing. COMPARISON:  CT on 02/01/2021 FINDINGS: Lower chest: No acute findings. Hepatobiliary: No hepatic masses visualized on this unenhanced exam. Diffuse dilatation of the intrahepatic bile ducts is seen due to stricture of the common hepatic duct in the liver hilum. No evidence of cholecystitis. Pancreas: Pancreatic ductal dilatation is seen with obstruction in the pancreatic head. A solid T2 hypointense mass is seen in the pancreatic head measuring 3.6 x 3.6 cm on image 22/4, which is new since earlier CT on 12/29/2020. Adjacent soft tissue edema is seen within the porta hepatis and throughout the peripancreatic region. This rapid appearance in the past month is suggestive of focal pancreatitis, with metastatic disease or primary pancreatic carcinoma considered less likely. Spleen:  Within normal limits in size. Adrenals/Urinary tract: No evidence of renal mass or abscess. Moderate right hydronephrosis is also new since 12/29/2020, with soft tissue thickening seen involving the proximal right ureter, with adjacent retroperitoneal edema. These findings may be secondary to pancreatitis, with metastatic disease considered less likely. Stomach/Bowel: No evidence of obstruction or wall thickening. Vascular/Lymphatic: Mild retroperitoneal lymphadenopathy is seen with largest lymph node measuring 1.5 cm on image 27/4. This shows mild decrease from 2.4 cm on earlier CT of 12/29/2020. Other:  None. 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 IMPRESSION: 3.6 cm masslike area in the pancreatic head with adjacent peripancreatic edema, which also causes diffuse biliary and pancreatic ductal dilatation. Rapid appearance since prior CT  approximately 1 month ago is suggestive of focal pancreatitis, although metastatic disease cannot be excluded. Moderate right hydronephrosis with soft tissue prominence involving the proximal right ureter. These findings may also be secondary to acute pancreatitis, although metastatic disease cannot be excluded. Mild decrease in retroperitoneal lymphadenopathy since earlier CT on 10/18/202222. Diffuse bone metastases. Electronically Signed   By: Marlaine Hind M.D.   On: 02/01/2021 21:54   DG ERCP BILIARY & PANCREATIC DUCTS  Result Date: 02/02/2021 CLINICAL DATA:  ERCP EXAM: ERCP TECHNIQUE: Multiple spot images obtained with the fluoroscopic device and submitted for interpretation post-procedure. FLUOROSCOPY TIME:  Refer to procedure report. COMPARISON:  None. FINDINGS: A total of 2 fluoroscopic spot images are submitted for review taken during ERCP procedure. Images demonstrate a scope overlying the upper abdomen. There then appears to be wire catheterization or attempted catheterization through the ampulla. No further images submitted. IMPRESSION: Fluoroscopic spot images during ERCP as described. Refer to procedure report for  full details. These images were submitted for radiologic interpretation only. Please see the procedural report for the amount of contrast and the fluoroscopy time utilized. Electronically Signed   By: Albin Felling M.D.   On: 02/02/2021 16:50   DG C-Arm 1-60 Min-No Report  Result Date: 02/02/2021 Fluoroscopy was utilized by the requesting physician.  No radiographic interpretation.   IR BILIARY DRAIN PLACEMENT WITH CHOLANGIOGRAM  Result Date: 02/03/2021 INDICATION: 55 year old woman with metastatic breast cancer and common bile duct obstruction presents to interventional radiology for biliary drain placement. EXAM: Ultrasound and fluoroscopy guided internal external biliary drain placement MEDICATIONS: Cefoxitin 2 g IV; The antibiotic was administered within an appropriate time  frame prior to the initiation of the procedure. ANESTHESIA/SEDATION: Moderate (conscious) sedation was employed during this procedure. A total of Versed 3 mg and Fentanyl 100 mcg was administered intravenously by the radiology nurse. Total intra-service moderate Sedation Time: 17 minutes. The patient's level of consciousness and vital signs were monitored continuously by radiology nursing throughout the procedure under my direct supervision. FLUOROSCOPY TIME:  Fluoroscopy Time: 2 minutes 54 seconds (41 mGy). COMPLICATIONS: None immediate. PROCEDURE: Informed written consent was obtained from the patient after a thorough discussion of the procedural risks, benefits and alternatives. All questions were addressed. Maximal Sterile Barrier Technique was utilized including caps, mask, sterile gowns, sterile gloves, sterile drape, hand hygiene and skin antiseptic. A timeout was performed prior to the initiation of the procedure. Patient positioned supine on the procedure table. Epigastric skin prepped and draped in usual fashion. Ultrasound evaluation demonstrated moderate dilated left bile ducts. Using continuous ultrasound guidance, left hepatic lobe bile duct was accessed with a 21 gauge needle. Contrast administered through the needle under fluoroscopy opacified the biliary tree. 0.018 inch guidewire was advanced through the 21 gauge needle. 21 gauge needle exchanged for transitional dilator set. Transitional dilator set exchanged for Kumpe the catheter over 0.035 inch guidewire. Kumpe catheter advanced to the duodenum. 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 Kumpe catheter exchanged for 10 French internal external biliary drain. Contrast administered through the drain under fluoroscopy confirmed appropriate positioning. Drain was secured to skin with suture and connected to bag. IMPRESSION: 1. Ultrasound fluoroscopy guided 10 French internal external biliary drain placement as above. 2. Long segment  stenosis of the common bile duct. Electronically Signed   By: Miachel Roux M.D.   On: 02/03/2021 15:37   US Abdomen Limited RUQ (LIVER/GB)  Result Date: 01/28/2021 CLINICAL DATA:  Elevated bilirubin. EXAM: ULTRASOUND ABDOMEN LIMITED RIGHT UPPER QUADRANT COMPARISON:  CT temporal bones, 12/29/2020. FINDINGS: Gallbladder: Nondistended, sludge-filled gallbladder with gallbladder wall measuring near the upper limit of normal in thickness, at 0.4 cm. No sonographic Murphy sign noted by sonographer. Common bile duct: Diameter: 0.6 cm Liver: No focal lesion identified. Within normal limits in parenchymal echogenicity. Portal vein is patent on color Doppler imaging with normal direction of blood flow towards the liver. Other: No ascites. IMPRESSION: Biliary sludge without additional sonographic evidence of acute cholecystitis. If concern for biliary dyskinesia, recommend NM HIDA for further evaluation. Electronically Signed   By: Michaelle Birks M.D.   On: 01/28/2021 08:12    Labs:  CBC: Recent Labs    02/07/21 0521 02/08/21 0600 02/09/21 0532 02/10/21 0505  WBC 12.3* 13.3* 13.5* 14.8*  HGB 10.5* 10.6* 10.9* 10.5*  HCT 32.2* 33.0* 33.0* 32.5*  PLT 336 387 413* 481*    COAGS: Recent Labs    02/01/21 1418 02/02/21 0515  INR 1.1  1.0    BMP: Recent Labs    02/07/21 0521 02/08/21 0600 02/09/21 0532 02/10/21 0505  NA 130* 134* 128* 128*  K 4.0 3.1* 4.2 3.9  CL 93* 105 94* 98  CO2 28 21* 26 20*  GLUCOSE 118* 106* 123* 127*  BUN 26* 20 22* 26*  CALCIUM 9.9 7.7* 10.2 9.2  CREATININE 1.25* 0.93 1.33* 1.35*  GFRNONAA 51* >60 48* 47*    LIVER FUNCTION TESTS: Recent Labs    02/07/21 0521 02/08/21 0600 02/09/21 0532 02/10/21 0505  BILITOT 9.9* 6.0* 6.7* 5.1*  AST 34 29 39 37  ALT 30 27 40 40  ALKPHOS 248* 179* 229* 192*  PROT 7.5 6.1* 7.9 7.0  ALBUMIN 2.4* 1.9* 2.5* 2.4*    TUMOR MARKERS: No results for input(s): AFPTM, CEA, CA199, CHROMGRNA in the last 8760 hours.  Assessment  and Plan: 55 y.o. female with metastatic breast CA who was fund to have hyperbilirubinemia with dilated intrahepatic bile ducts, s/p failed ERCP, s/p percutaneous int/ext bili drain placement with IR on 11/23.   IR was requested for brush biopsy and possible internalization of the biliary drain by Dr. Benson Norway.  Case was reviewed and approved by Dr. Serafina Royals.   Patient had small breakfast, made NPO at 1015 VSS CBC worsening leukocytosis, 14.8 today - probably due to possible pancreatitic per Dr. Serafina Royals  Hgb 10.5, plt 481  In AKI, improving -BUN 26, Creatinine 1.35, GFR 47  Alk phos 192, T bili trending down, 5.1   Risks and benefits of common bile duct biopsy was discussed with the patient and/or patient's family including, but not limited to bleeding, infection, damage to adjacent structures or low yield requiring additional tests.  All of the questions were answered and there is agreement to proceed.  Consent signed and in chart.   Thank you for this interesting consult.  I greatly enjoyed meeting [name] and look forward to participating in their care.  A copy of this report was sent to the requesting provider on this date.  Electronically Signed: Tera Mater, PA-C 02/10/2021, 10:24 AM   I spent a total of 40 Minutes    in face to face in clinical consultation, greater than 50% of which was counseling/coordinating care for brush bx of CBD and internalization of biliary drain.   This chart was dictated using voice recognition software.  Despite best efforts to proofread,  errors can occur which can change the documentation meaning.

## 2021-02-10 NOTE — Anesthesia Postprocedure Evaluation (Signed)
Anesthesia Post Note  Patient: [name]  Procedure(s) Performed: UPPER ESOPHAGEAL ENDOSCOPIC ULTRASOUND (EUS) ESOPHAGOGASTRODUODENOSCOPY (EGD)     Patient location during evaluation: PACU Anesthesia Type: MAC Level of consciousness: awake and alert Pain management: pain level controlled Vital Signs Assessment: post-procedure vital signs reviewed and stable Respiratory status: spontaneous breathing, nonlabored ventilation and respiratory function stable Cardiovascular status: stable and blood pressure returned to baseline Anesthetic complications: no   No notable events documented.  Last Vitals:  Vitals:   02/10/21 1705 02/10/21 1710  BP: 123/87 125/87  Pulse: 89 92  Resp: 18 19  Temp:    SpO2: 99% 99%    Last Pain:  Vitals:   02/10/21 1223  TempSrc: Oral  PainSc:                  Audry Pili

## 2021-02-10 NOTE — Progress Notes (Signed)
1 Day Post-Op Subjective: Patient reports abdominal pain and suprapubic pain.  Her suprapubic pain is not associated with voiding and she has no dysuria.  No gross hematuria but has some cola colored urine.  She has had no flank pain.    Objective: Vital signs in last 24 hours: Temp:  [97.3 F (36.3 C)-98.7 F (37.1 C)] 98.1 F (36.7 C) (11/30 1223) Pulse Rate:  [87-103] 87 (11/30 1223) Resp:  [16-18] 18 (11/30 0531) BP: (105-115)/(60-82) 115/78 (11/30 1223) SpO2:  [99 %-100 %] 100 % (11/30 1223)  Intake/Output from previous day: 11/29 0701 - 11/30 0700 In: 1080.3 [I.V.:1075.3] Out: 1400 [Drains:1400] Intake/Output this shift: Total I/O In: 195.6 [I.V.:190.6; Other:5] Out: 650 [Drains:650]  Physical Exam:  Alert and oriented, she looks more comfortable and in better spirits than she did last week.  Lab Results: Recent Labs    02/08/21 0600 02/09/21 0532 02/10/21 0505  HGB 10.6* 10.9* 10.5*  HCT 33.0* 33.0* 32.5*   BMET Recent Labs    02/09/21 0532 02/10/21 0505  NA 128* 128*  K 4.2 3.9  CL 94* 98  CO2 26 20*  GLUCOSE 123* 127*  BUN 22* 26*  CREATININE 1.33* 1.35*  CALCIUM 10.2 9.2   No results for input(s): LABPT, INR in the last 72 hours. No results for input(s): LABURIN in the last 72 hours. Results for orders placed or performed during the hospital encounter of 02/01/21  Resp Panel by RT-PCR (Flu A&B, Covid) Nasopharyngeal Swab     Status: None   Collection Time: 02/01/21  6:27 PM   Specimen: Nasopharyngeal Swab; Nasopharyngeal(NP) swabs in vial transport medium  Result Value Ref Range Status   SARS Coronavirus 2 by RT PCR NEGATIVE NEGATIVE Final    Comment: (NOTE) SARS-CoV-2 target nucleic acids are NOT DETECTED.  The SARS-CoV-2 RNA is generally detectable in upper respiratory specimens during the acute phase of infection. The lowest concentration of SARS-CoV-2 viral copies this assay can detect is 138 copies/mL. A negative result does not preclude  SARS-Cov-2 infection and should not be used as the sole basis for treatment or other patient management decisions. A negative result may occur with  improper specimen collection/handling, submission of specimen other than nasopharyngeal swab, presence of viral mutation(s) within the areas targeted by this assay, and inadequate number of viral copies(<138 copies/mL). A negative result must be combined with clinical observations, patient history, and epidemiological information. The expected result is Negative.  Fact Sheet for Patients:  EntrepreneurPulse.com.au  Fact Sheet for Healthcare Providers:  IncredibleEmployment.be  This test is no t yet approved or cleared by the [place] FDA and  has been authorized for detection and/or diagnosis of SARS-CoV-2 by FDA under an Emergency Use Authorization (EUA). This EUA will remain  in effect (meaning this test can be used) for the duration of the COVID-19 declaration under Section 564(b)(1) of the Act, 21 U.S.C.section 360bbb-3(b)(1), unless the authorization is terminated  or revoked sooner.       Influenza A by PCR NEGATIVE NEGATIVE Final   Influenza B by PCR NEGATIVE NEGATIVE Final    Comment: (NOTE) The Xpert Xpress SARS-CoV-2/FLU/RSV plus assay is intended as an aid in the diagnosis of influenza from Nasopharyngeal swab specimens and should not be used as a sole basis for treatment. Nasal washings and aspirates are unacceptable for Xpert Xpress SARS-CoV-2/FLU/RSV testing.  Fact Sheet for Patients: EntrepreneurPulse.com.au  Fact Sheet for Healthcare Providers: IncredibleEmployment.be  This test is not yet approved or cleared by the [place]  States FDA and has been authorized for detection and/or diagnosis of SARS-CoV-2 by FDA under an Emergency Use Authorization (EUA). This EUA will remain in effect (meaning this test can be used) for the duration of  the COVID-19 declaration under Section 564(b)(1) of the Act, 21 U.S.C. section 360bbb-3(b)(1), unless the authorization is terminated or revoked.  Performed at [facility], [place] [address] [place], [place] [address]   Aerobic/Anaerobic Culture w Gram Stain (surgical/deep wound)     Status: None   Collection Time: 02/03/21 11:18 AM   Specimen: BILE  Result Value Ref Range Status   Specimen Description   Final    BILE Performed at [facility] [address], [place], [place] [address]    Special Requests   Final    NONE Performed at [facility], [place] [address], [place], [place] [address]    Gram Stain   Final    RARE WBC PRESENT,BOTH PMN AND MONONUCLEAR NO ORGANISMS SEEN    Culture   Final    RARE STREPTOCOCCUS PARASANGUINIS NO ANAEROBES ISOLATED Performed at [facility], [place] [address], [place], [place] [address]    Report Status 02/08/2021 FINAL  Final   Organism ID, Bacteria STREPTOCOCCUS PARASANGUINIS  Final      Susceptibility   Streptococcus parasanguinis - MIC*    PENICILLIN 0.25 INTERMEDIATE Intermediate     CEFTRIAXONE <=0.12 SENSITIVE Sensitive     LEVOFLOXACIN >=16 RESISTANT Resistant     VANCOMYCIN 0.5 SENSITIVE Sensitive     * RARE STREPTOCOCCUS PARASANGUINIS    Studies/Results: No results found.  Assessment/Plan: Right hydronephrosis- new onset on 02/01/2021 CT - there was hyperdense material possibly in the right proximal ureter, but it isn't clear if this is a clot in the ureter, new growth or external compression.  She underwent a repeat CT scan 02/06/2021 which showed continued but stable moderate right hydronephrosis down to the right proximal ureter.  No left hydro.  Her creatinine has trended slightly up to 1.3 - 1.35.  I discussed with her the nature risk benefits and alternatives to cystoscopy with retrograde pyelograms, ureteroscopy and stent placement Friday or Saturday. She will  consider and we will follow her creatinine and consider repeating an ultrasound   LOS: 8 days   Festus Aloe 02/10/2021, 4:27 PM

## 2021-02-11 ENCOUNTER — Ambulatory Visit: Payer: BC Managed Care – PPO

## 2021-02-11 ENCOUNTER — Ambulatory Visit
Admission: RE | Admit: 2021-02-11 | Discharge: 2021-02-11 | Disposition: A | Discharge status: Home / Self Care | Payer: BC Managed Care – PPO | Source: Ambulatory Visit | Attending: Radiation Oncology | Admitting: Radiation Oncology

## 2021-02-11 DIAGNOSIS — C50411 Malignant neoplasm of upper-outer quadrant of right female breast: Secondary | ICD-10-CM | POA: Insufficient documentation

## 2021-02-11 DIAGNOSIS — Z510 Encounter for antineoplastic radiation therapy: Secondary | ICD-10-CM | POA: Insufficient documentation

## 2021-02-11 DIAGNOSIS — R17 Unspecified jaundice: Secondary | ICD-10-CM | POA: Diagnosis not present

## 2021-02-11 DIAGNOSIS — Z170 Estrogen receptor positive status [ER+]: Secondary | ICD-10-CM | POA: Insufficient documentation

## 2021-02-11 LAB — COMPREHENSIVE METABOLIC PANEL
ALT: 40 U/L (ref 0–44)
AST: 35 U/L (ref 15–41)
Albumin: 2.4 g/dL — ABNORMAL LOW (ref 3.5–5.0)
Alkaline Phosphatase: 216 U/L — ABNORMAL HIGH (ref 38–126)
Anion gap: 6 (ref 5–15)
BUN: 21 mg/dL — ABNORMAL HIGH (ref 6–20)
CO2: 24 mmol/L (ref 22–32)
Calcium: 9.5 mg/dL (ref 8.9–10.3)
Chloride: 99 mmol/L (ref 98–111)
Creatinine, Ser: 1.29 mg/dL — ABNORMAL HIGH (ref 0.44–1.00)
GFR, Estimated: 49 mL/min — ABNORMAL LOW (ref >60)
Glucose, Bld: 114 mg/dL — ABNORMAL HIGH (ref 70–99)
Potassium: 4.3 mmol/L (ref 3.5–5.1)
Sodium: 129 mmol/L — ABNORMAL LOW (ref 135–145)
Total Bilirubin: 4 mg/dL — ABNORMAL HIGH (ref 0.3–1.2)
Total Protein: 7.4 g/dL (ref 6.5–8.1)

## 2021-02-11 LAB — CBC
HCT: 31.3 % — ABNORMAL LOW (ref 36.0–46.0)
Hemoglobin: 10.5 g/dL — ABNORMAL LOW (ref 12.0–15.0)
MCH: 34 pg (ref 26.0–34.0)
MCHC: 33.5 g/dL (ref 30.0–36.0)
MCV: 101.3 fL — ABNORMAL HIGH (ref 80.0–100.0)
Platelets: 440 10*3/uL — ABNORMAL HIGH (ref 150–400)
RBC: 3.09 MIL/uL — ABNORMAL LOW (ref 3.87–5.11)
RDW: 13.1 % (ref 11.5–15.5)
WBC: 16 10*3/uL — ABNORMAL HIGH (ref 4.0–10.5)
nRBC: 0 % (ref 0.0–0.2)

## 2021-02-11 MED ORDER — ENOXAPARIN SODIUM 40 MG/0.4ML IJ SOSY
40.0000 mg | PREFILLED_SYRINGE | INTRAMUSCULAR | Status: DC
  Administered 2021-02-11: 40 mg via SUBCUTANEOUS
  Filled 2021-02-11: qty 0.4

## 2021-02-11 NOTE — Progress Notes (Signed)
PROGRESS NOTE    [name]  GQQ:[id number] DOB: 07-Oct-1965 DOA: 02/01/2021  PCP: Kara Resides, MD   Brief Narrative: This 55 year old Female with PMH significant of metastatic breast cancer with ongoing chemotherapy, Essential hypertension presented to [facility] ED with complaints of generalized pruritus. Initially It was thought to be secondary to immunotherapy Verzenio which was stopped.  Then she started having episodes of nausea,  vomiting, change in her stool color.  Work-up showed elevated total bilirubin.  Subsequently, CT abdomen pelvis showed concerning new right upper quadrant ductal dilation and concerning features of pancreatitis along with possible cholangitis/acute cholecystitis.  Seen by GI, underwent ERCP, unable to cannulate biliary duct.  Patient underwent percutaneous biliary drainage by IR.  Post EUS by GI, Dr. Benson Franco, revealed "one stent was visualized endosonographically in the common bile duct.  Hyperechoic material consistent with sludge was visualized endosonographically in the gallbladder body.  A cystic lesion was seen in the pancreatic head."  GI recommended "- Internalize biliary stent with a removable stent, if possible."  Seen by IR on 02/10/2021 + cholangiogram, biliary brush biopsy of common bile duct, biliary drain exchange.  Right hydronephrosis seen on CT scan, was seen by urology with plan for renal stent placement possibly on 02/12/2021 or 02/13/2021.  02/11/21: Patient was seen and examined at bedside.  Denies having any abdominal pain.  There were no acute events overnight.  She is tolerating a regular diet.  Possible chemotherapy today for her breast cancer.   Assessment & Plan:   Principal Problem:   Jaundice Active Problems:   Essential hypertension, benign   Malignant neoplasm of upper-outer quadrant of right breast in female, estrogen receptor positive ([place])   Acute pancreatitis  Biliary obstruction, T bilirubin downtrending: Acute pancreatitis  versus metastatic lesion: Patient presented with abdominal pain, nausea, vomiting,  jaundice,  change in stool color and pruritus. CT showed  some evidence of obstruction/inflammation in the right upper quadrant area.  MRCP showed pancreatic head mass concerning for focal acute pancreatitis versus metastatic lesion.   Seen by GI, underwent ERCP.  Unable to cannulate biliary duct. IR was consulted, patient underwent successful percutaneous drainage. 02/03/21 EUS done on 02/09/2021 revealed findings as stated above. Seen by IR on 02/10/2021 + cholangiogram, biliary brush biopsy of common bile duct, biliary drain exchange. Continue pain control, no pain reported on 02/11/2021.  Tolerating a regular diet. Continue IV antiemetics as needed T bilirubin downtrending 4.0 from 5.1 from 6.7.  Right ureteral hydronephrosis: UA negative.  Urine culture not collected. Renal ultrasound shows Severe right-sided hydronephrosis compatible with high-grade stenosis. Seen by urology with plan for renal stent placement on 02/12/2021 oh 02/13/2021.   Essential hypertension: BP stable. Continue propranolol and Aldactone. Continue to monitor vital signs.  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 Jana Franco. Possible chemotherapy on 02/11/2021.  Microcytic anemia: H&H is stable.   Improving AKI Baseline creatinine 0.9 Creatinine downtrending 1.29 from 1.35. Continue to avoid nephrotoxic agents, dehydration and hypotension. Continue IV fluid hydration  Continue to monitor urine output with strict I's and O's  Improving hyponatremia, hypovolemic: Serum sodium 129 from 128 Increase oral solute intake. Continue normal saline at 50 cc/h  Physical debility PT to assess Fall precautions.  DVT prophylaxis: SCDs, subcu Lovenox daily. Code Status: Full code. Family Communication: No family at bed side. Disposition Plan:   Status is: Inpatient  Remains inpatient  appropriate because: Chemotherapy 02/11/2021, renal stent placement by urology pending.  Consultants:  GI IR Urology  Procedures: ERCP ,   Percutaneous biliary drain.  Antimicrobials:   Anti-infectives (From admission, onward)    Start     Dose/Rate Route Frequency Ordered Stop   02/10/21 1445  cefTRIAXone (ROCEPHIN) 2 g in sodium chloride 0.9 % 100 mL IVPB        2 g 200 mL/hr over 30 Minutes Intravenous To Radiology 02/10/21 1354 02/10/21 1603   02/03/21 1130  cefOXitin (MEFOXIN) 2 g in sodium chloride 0.9 % 100 mL IVPB        2 g 200 mL/hr over 30 Minutes Intravenous  Once 02/03/21 1033 02/03/21 1124        Subjective: General: Pleasant well-developed well-nourished in no acute distress.  She is alert and oriented x3. Cardiovascular: Regular rate and rhythm no rubs or gallops.  No JVD or thyromegaly noted.   Respiratory: Clear to auscultation with no wheezes or rales.   GI: Nontender nondistended bowel sounds present.   Musculoskeletal: No lower extremity edema bilaterally.   Psych: Mood is appropriate for condition and setting.   Neuro: Nonfocal.  Moves all 4 extremities equally. Skin: No ulcerative lesions noted.  Objective: Vitals:   02/10/21 1700 02/10/21 1705 02/10/21 1710 02/11/21 0707  BP: 125/89 123/87 125/87 101/73  Pulse: 88 89 92 99  Resp: 17 18 19 18   Temp:    98.2 F (36.8 C)  TempSrc:    Oral  SpO2: 99% 99% 99% 100%  Weight:      Height:        Intake/Output Summary (Last 24 hours) at 02/11/2021 1332 Last data filed at 02/10/2021 1533 Gross per 24 hour  Intake 5.27 ml  Output 450 ml  Net -444.73 ml   Filed Weights   02/01/21 1300 02/02/21 1347  Weight: 91.2 kg 91.2 kg    Examination:  General exam: Appears comfortable, deconditioned, not in any distress. Respiratory system: CTA bilaterally. Respiratory effort normal. RR 15 Cardiovascular system: S1-S2 heard, regular rate and rhythm, no murmur. Chest: Chemo-Port noted in the left  chest. Gastrointestinal system: Abdomen is soft, mildly tender around biliary drain area.  Mildly distended, BS+.   Biliary drain noted with clear yellow-colored fluid. Central nervous system: Alert and oriented x 3. No focal neurological deficits. Extremities: No edema, no cyanosis, no clubbing. Skin: No rashes, lesions or ulcers Psychiatry: Judgement and insight appear normal. Mood & affect appropriate.     Data Reviewed: I have personally reviewed following labs and imaging studies  CBC: Recent Labs  Lab 02/07/21 0521 02/08/21 0600 02/09/21 0532 02/10/21 0505 02/11/21 0604  WBC 12.3* 13.3* 13.5* 14.8* 16.0*  HGB 10.5* 10.6* 10.9* 10.5* 10.5*  HCT 32.2* 33.0* 33.0* 32.5* 31.3*  MCV 101.3* 100.9* 100.6* 100.9* 101.3*  PLT 336 387 413* 481* 779*   Basic Metabolic Panel: Recent Labs  Lab 02/06/21 0604 02/07/21 0521 02/08/21 0600 02/09/21 0532 02/10/21 0505 02/11/21 0604  NA  --  130* 134* 128* 128* 129*  K  --  4.0 3.1* 4.2 3.9 4.3  CL  --  93* 105 94* 98 99  CO2  --  28 21* 26 20* 24  GLUCOSE  --  118* 106* 123* 127* 114*  BUN  --  26* 20 22* 26* 21*  CREATININE  --  1.25* 0.93 1.33* 1.35* 1.29*  CALCIUM  --  9.9 7.7* 10.2 9.2 9.5  MG 2.2 2.2 1.9 2.4 2.4  --   PHOS  --  3.8  --  3.1 3.1  --    GFR: Estimated Creatinine Clearance: 61.1 mL/min (A) (by C-G formula based on SCr of 1.29 mg/dL (H)). Liver Function Tests: Recent Labs  Lab 02/07/21 0521 02/08/21 0600 02/09/21 0532 02/10/21 0505 02/11/21 0604  AST 34 29 39 37 35  ALT 30 27 40 40 40  ALKPHOS 248* 179* 229* 192* 216*  BILITOT 9.9* 6.0* 6.7* 5.1* 4.0*  PROT 7.5 6.1* 7.9 7.0 7.4  ALBUMIN 2.4* 1.9* 2.5* 2.4* 2.4*   No results for input(s): LIPASE, AMYLASE in the last 168 hours.  No results for input(s): AMMONIA in the last 168 hours. Coagulation Profile: No results for input(s): INR, PROTIME in the last 168 hours.  Cardiac Enzymes: No results for input(s): CKTOTAL, CKMB, CKMBINDEX, TROPONINI in  the last 168 hours. BNP (last 3 results) No results for input(s): PROBNP in the last 8760 hours. HbA1C: No results for input(s): HGBA1C in the last 72 hours. CBG: No results for input(s): GLUCAP in the last 168 hours. Lipid Profile: No results for input(s): CHOL, HDL, LDLCALC, TRIG, CHOLHDL, LDLDIRECT in the last 72 hours. Thyroid Function Tests: No results for input(s): TSH, T4TOTAL, FREET4, T3FREE, THYROIDAB in the last 72 hours.  Anemia Panel: No results for input(s): VITAMINB12, FOLATE, FERRITIN, TIBC, IRON, RETICCTPCT in the last 72 hours. Sepsis Labs: No results for input(s): PROCALCITON, LATICACIDVEN in the last 168 hours.  Recent Results (from the past 240 hour(s))  Resp Panel by RT-PCR (Flu A&B, Covid) Nasopharyngeal Swab     Status: None   Collection Time: 02/01/21  6:27 PM   Specimen: Nasopharyngeal Swab; Nasopharyngeal(NP) swabs in vial transport medium  Result Value Ref Range Status   SARS Coronavirus 2 by RT PCR NEGATIVE NEGATIVE Final    Comment: (NOTE) SARS-CoV-2 target nucleic acids are NOT DETECTED.  The SARS-CoV-2 RNA is generally detectable in upper respiratory specimens during the acute phase of infection. The lowest concentration of SARS-CoV-2 viral copies this assay can detect is 138 copies/mL. A negative result does not preclude SARS-Cov-2 infection and should not be used as the sole basis for treatment or other patient management decisions. A negative result may occur with  improper specimen collection/handling, submission of specimen other than nasopharyngeal swab, presence of viral mutation(s) within the areas targeted by this assay, and inadequate number of viral copies(<138 copies/mL). A negative result must be combined with clinical observations, patient history, and epidemiological information. The expected result is Negative.  Fact Sheet for Patients:  EntrepreneurPulse.com.au  Fact Sheet for Healthcare Providers:   IncredibleEmployment.be  This test is no t yet approved or cleared by the [place] FDA and  has been authorized for detection and/or diagnosis of SARS-CoV-2 by FDA under an Emergency Use Authorization (EUA). This EUA will remain  in effect (meaning this test can be used) for the duration of the COVID-19 declaration under Section 564(b)(1) of the Act, 21 U.S.C.section 360bbb-3(b)(1), unless the authorization is terminated  or revoked sooner.       Influenza A by PCR NEGATIVE NEGATIVE Final   Influenza B by PCR NEGATIVE NEGATIVE Final    Comment: (NOTE) The Xpert Xpress SARS-CoV-2/FLU/RSV plus assay is intended as an aid in the diagnosis of influenza from Nasopharyngeal swab specimens and should not be used as a sole basis for treatment. Nasal washings and aspirates are unacceptable for Xpert Xpress SARS-CoV-2/FLU/RSV testing.  Fact Sheet for Patients: EntrepreneurPulse.com.au  Fact Sheet for Healthcare Providers: IncredibleEmployment.be  This test is not yet approved or cleared by the  [place] Architectural technologist and has been authorized for detection and/or diagnosis of SARS-CoV-2 by FDA under an Print production planner (EUA). This EUA will remain in effect (meaning this test can be used) for the duration of the COVID-19 declaration under Section 564(b)(1) of the Act, 21 U.S.C. section 360bbb-3(b)(1), unless the authorization is terminated or revoked.  Performed at [facility], [place] [address], [place], [place] [address]   Aerobic/Anaerobic Culture w Gram Stain (surgical/deep wound)     Status: None   Collection Time: 02/03/21 11:18 AM   Specimen: BILE  Result Value Ref Range Status   Specimen Description   Final    BILE Performed at [place] [address], [place], [place] [address]    Special Requests   Final    NONE Performed at [facility], [place]  [address], [place], [place] [address]    Gram Stain   Final    RARE WBC PRESENT,BOTH PMN AND MONONUCLEAR NO ORGANISMS SEEN    Culture   Final    RARE STREPTOCOCCUS PARASANGUINIS NO ANAEROBES ISOLATED Performed at [facility], [place] [address], [place], [place] [address]    Report Status 02/08/2021 FINAL  Final   Organism ID, Bacteria STREPTOCOCCUS PARASANGUINIS  Final      Susceptibility   Streptococcus parasanguinis - MIC*    PENICILLIN 0.25 INTERMEDIATE Intermediate     CEFTRIAXONE <=0.12 SENSITIVE Sensitive     LEVOFLOXACIN >=16 RESISTANT Resistant     VANCOMYCIN 0.5 SENSITIVE Sensitive     * RARE STREPTOCOCCUS PARASANGUINIS    Radiology Studies: No results found.  Scheduled Meds:  Chlorhexidine Gluconate Cloth  6 each Topical Daily   cholecalciferol  5,000 Units Oral q morning   multivitamin with minerals  1 tablet Oral Daily   propranolol  10 mg Oral BID   sodium chloride flush  5 mL Intracatheter Q8H   spironolactone  25 mg Oral BID   vitamin B-12  5,000 mcg Oral q morning   Continuous Infusions:  sodium chloride 50 mL/hr at 02/10/21 0557   promethazine (PHENERGAN) injection (IM or IVPB)       LOS: 9 days    Time spent: 25 mins    Kayleen Memos, MD [facility]   If 7PM-7AM, please contact night-coverage

## 2021-02-12 ENCOUNTER — Ambulatory Visit
Admission: RE | Admit: 2021-02-12 | Discharge: 2021-02-12 | Disposition: A | Discharge status: Home / Self Care | Payer: BC Managed Care – PPO | Source: Ambulatory Visit | Attending: Radiation Oncology | Admitting: Radiation Oncology

## 2021-02-12 DIAGNOSIS — R17 Unspecified jaundice: Secondary | ICD-10-CM | POA: Diagnosis not present

## 2021-02-12 LAB — COMPREHENSIVE METABOLIC PANEL
ALT: 37 U/L (ref 0–44)
AST: 37 U/L (ref 15–41)
Albumin: 2.3 g/dL — ABNORMAL LOW (ref 3.5–5.0)
Alkaline Phosphatase: 184 U/L — ABNORMAL HIGH (ref 38–126)
Anion gap: 6 (ref 5–15)
BUN: 13 mg/dL (ref 6–20)
CO2: 24 mmol/L (ref 22–32)
Calcium: 9.1 mg/dL (ref 8.9–10.3)
Chloride: 101 mmol/L (ref 98–111)
Creatinine, Ser: 1.07 mg/dL — ABNORMAL HIGH (ref 0.44–1.00)
GFR, Estimated: >60 mL/min (ref >60)
Glucose, Bld: 118 mg/dL — ABNORMAL HIGH (ref 70–99)
Potassium: 4 mmol/L (ref 3.5–5.1)
Sodium: 131 mmol/L — ABNORMAL LOW (ref 135–145)
Total Bilirubin: 3.1 mg/dL — ABNORMAL HIGH (ref 0.3–1.2)
Total Protein: 6.9 g/dL (ref 6.5–8.1)

## 2021-02-12 LAB — CBC WITH DIFFERENTIAL/PLATELET
Abs Immature Granulocytes: 0.52 10*3/uL — ABNORMAL HIGH (ref 0.00–0.07)
Basophils Absolute: 0.1 10*3/uL (ref 0.0–0.1)
Basophils Relative: 0 %
Eosinophils Absolute: 0 10*3/uL (ref 0.0–0.5)
Eosinophils Relative: 0 %
HCT: 28.7 % — ABNORMAL LOW (ref 36.0–46.0)
Hemoglobin: 9.2 g/dL — ABNORMAL LOW (ref 12.0–15.0)
Immature Granulocytes: 4 %
Lymphocytes Relative: 7 %
Lymphs Abs: 0.9 10*3/uL (ref 0.7–4.0)
MCH: 32.9 pg (ref 26.0–34.0)
MCHC: 32.1 g/dL (ref 30.0–36.0)
MCV: 102.5 fL — ABNORMAL HIGH (ref 80.0–100.0)
Monocytes Absolute: 1.2 10*3/uL — ABNORMAL HIGH (ref 0.1–1.0)
Monocytes Relative: 10 %
Neutro Abs: 10.2 10*3/uL — ABNORMAL HIGH (ref 1.7–7.7)
Neutrophils Relative %: 79 %
Platelets: 395 10*3/uL (ref 150–400)
RBC: 2.8 MIL/uL — ABNORMAL LOW (ref 3.87–5.11)
RDW: 12.9 % (ref 11.5–15.5)
WBC: 12.9 10*3/uL — ABNORMAL HIGH (ref 4.0–10.5)
nRBC: 0.2 % (ref 0.0–0.2)

## 2021-02-12 MED ORDER — BACITRACIN-NEOMYCIN-POLYMYXIN OINTMENT TUBE
TOPICAL_OINTMENT | Freq: Two times a day (BID) | CUTANEOUS | Status: DC
  Filled 2021-02-12: qty 14.17

## 2021-02-12 MED ORDER — NEOMYCIN-POLYMYXIN-PRAMOXINE 1 % EX CREA: TOPICAL_CREAM | Freq: Two times a day (BID) | CUTANEOUS | Status: DC

## 2021-02-12 MED ORDER — SODIUM CHLORIDE 0.9 % IV SOLN
2.0000 g | INTRAVENOUS | Status: DC
  Administered 2021-02-12: 2 g via INTRAVENOUS
  Filled 2021-02-12 (×2): qty 20

## 2021-02-12 NOTE — Progress Notes (Signed)
3 Days Post-Op Subjective: Patient reports voiding well without dysuria or gross hematuria.  She complains of some lower abdominal pain.  Not associated with voiding.  Tylenol improves.  No flank pain.  Creatinine has improved to 1.03 and overall remained stable throughout the week.  Objective: Vital signs in last 24 hours: Temp:  [98.2 F (36.8 C)-98.6 F (37 C)] 98.3 F (36.8 C) (12/02 0609) Pulse Rate:  [82-86] 82 (12/02 0609) Resp:  [16-19] 19 (12/02 0609) BP: (122-132)/(76-89) 132/89 (12/02 0609) SpO2:  [98 %-100 %] 98 % (12/02 0609)  Intake/Output from previous day: 12/01 0701 - 12/02 0700 In: 480 [P.O.:480] Out: -  Intake/Output this shift: No intake/output data recorded.  Physical Exam:  Looks well, alert and oriented Walking around the room   Lab Results: Recent Labs    02/10/21 0505 02/11/21 0604 02/12/21 0335  HGB 10.5* 10.5* 9.2*  HCT 32.5* 31.3* 28.7*   BMET Recent Labs    02/11/21 0604 02/12/21 0335  NA 129* 131*  K 4.3 4.0  CL 99 101  CO2 24 24  GLUCOSE 114* 118*  BUN 21* 13  CREATININE 1.29* 1.07*  CALCIUM 9.5 9.1   No results for input(s): LABPT, INR in the last 72 hours. No results for input(s): LABURIN in the last 72 hours. Results for orders placed or performed during the hospital encounter of 02/01/21  Resp Panel by RT-PCR (Flu A&B, Covid) Nasopharyngeal Swab     Status: None   Collection Time: 02/01/21  6:27 PM   Specimen: Nasopharyngeal Swab; Nasopharyngeal(NP) swabs in vial transport medium  Result Value Ref Range Status   SARS Coronavirus 2 by RT PCR NEGATIVE NEGATIVE Final    Comment: (NOTE) SARS-CoV-2 target nucleic acids are NOT DETECTED.  The SARS-CoV-2 RNA is generally detectable in upper respiratory specimens during the acute phase of infection. The lowest concentration of SARS-CoV-2 viral copies this assay can detect is 138 copies/mL. A negative result does not preclude SARS-Cov-2 infection and should not be used as the  sole basis for treatment or other patient management decisions. A negative result may occur with  improper specimen collection/handling, submission of specimen other than nasopharyngeal swab, presence of viral mutation(s) within the areas targeted by this assay, and inadequate number of viral copies(<138 copies/mL). A negative result must be combined with clinical observations, patient history, and epidemiological information. The expected result is Negative.  Fact Sheet for Patients:  EntrepreneurPulse.com.au  Fact Sheet for Healthcare Providers:  IncredibleEmployment.be  This test is no t yet approved or cleared by the [place] FDA and  has been authorized for detection and/or diagnosis of SARS-CoV-2 by FDA under an Emergency Use Authorization (EUA). This EUA will remain  in effect (meaning this test can be used) for the duration of the COVID-19 declaration under Section 564(b)(1) of the Act, 21 U.S.C.section 360bbb-3(b)(1), unless the authorization is terminated  or revoked sooner.       Influenza A by PCR NEGATIVE NEGATIVE Final   Influenza B by PCR NEGATIVE NEGATIVE Final    Comment: (NOTE) The Xpert Xpress SARS-CoV-2/FLU/RSV plus assay is intended as an aid in the diagnosis of influenza from Nasopharyngeal swab specimens and should not be used as a sole basis for treatment. Nasal washings and aspirates are unacceptable for Xpert Xpress SARS-CoV-2/FLU/RSV testing.  Fact Sheet for Patients: EntrepreneurPulse.com.au  Fact Sheet for Healthcare Providers: IncredibleEmployment.be  This test is not yet approved or cleared by the [place] FDA and has been authorized for detection and/or  diagnosis of SARS-CoV-2 by FDA under an Emergency Use Authorization (EUA). This EUA will remain in effect (meaning this test can be used) for the duration of the COVID-19 declaration under Section 564(b)(1) of the  Act, 21 U.S.C. section 360bbb-3(b)(1), unless the authorization is terminated or revoked.  Performed at [facility], [place] [address], [place], [place] [address]   Aerobic/Anaerobic Culture w Gram Stain (surgical/deep wound)     Status: None   Collection Time: 02/03/21 11:18 AM   Specimen: BILE  Result Value Ref Range Status   Specimen Description   Final    BILE Performed at [facility] [address] [place], [place] [address]    Special Requests   Final    NONE Performed at [facility], [place] [address] [place], [place] [address]    Gram Stain   Final    RARE WBC PRESENT,BOTH PMN AND MONONUCLEAR NO ORGANISMS SEEN    Culture   Final    RARE STREPTOCOCCUS PARASANGUINIS NO ANAEROBES ISOLATED Performed at [facility], [place] [address], [place], [place] [address]    Report Status 02/08/2021 FINAL  Final   Organism ID, Bacteria STREPTOCOCCUS PARASANGUINIS  Final      Susceptibility   Streptococcus parasanguinis - MIC*    PENICILLIN 0.25 INTERMEDIATE Intermediate     CEFTRIAXONE <=0.12 SENSITIVE Sensitive     LEVOFLOXACIN >=16 RESISTANT Resistant     VANCOMYCIN 0.5 SENSITIVE Sensitive     * RARE STREPTOCOCCUS PARASANGUINIS    Studies/Results: IR EXCHANGE BILIARY DRAIN  Result Date: 02/11/2021 INDICATION: 55 year old female with history of metastatic breast cancer and biliary obstruction status post left percutaneous transhepatic, internal external biliary drain placement on 02/03/2021. EXAM: 1. Cholangiogram. 2. Biliary drain exchange. 3. Biliary brush biopsy. MEDICATIONS: Rocephin 2 gm IV; The antibiotic was administered within an appropriate time frame prior to the initiation of the procedure. ANESTHESIA/SEDATION: Moderate (conscious) sedation was employed during this procedure. A total of Versed 2 mg and Fentanyl 100 mcg was administered intravenously. Moderate Sedation Time: 19 minutes. The patient's level of  consciousness and vital signs were monitored continuously by radiology nursing throughout the procedure under my direct supervision. FLUOROSCOPY TIME:  Fluoroscopy Time: 3 minutes 24 seconds (34 mGy). COMPLICATIONS: None immediate. PROCEDURE: Informed written consent was obtained from the patient after a thorough discussion of the procedural risks, benefits and alternatives. All questions were addressed. Maximal Sterile Barrier Technique was utilized including caps, mask, sterile gowns, sterile gloves, sterile drape, hand hygiene and skin antiseptic. A timeout was performed prior to the initiation of the procedure. The upper abdomen and indwelling left-sided biliary drain were prepped and draped in standard fashion. Preprocedure scout radiograph demonstrated unchanged position of the indwelling biliary drain. Gentle hand injection demonstrated no significant intrahepatic biliary ductal dilation. Subdermal Local anesthesia was administered around the indwelling biliary drain. External suture was cut. The external portion of the tube was cut to release the pigtail and an Amplatz wire was inserted with its tip in the distal duodenum. The tube was removed over the wire. An 8 [place], 25 cm Brite tip sheath was then inserted with the distal tip in the duodenum. The Amplatz wire was exchanged for a 018 glidewire. Next, 3 passes were made near the biliary confluence and superior aspect of the common bile duct with separate brush biopsy sets. The brushes were cut in placed in saline and sent to pathology. The sheath was then readvanced into the small bowel and  the Glidewire was exchanged for an Amplatz wire. The sheath was then removed and a new, 10.2 [place] biliary drain was placed with the pigtail portion coiled in the duodenum and the radiopaque marker in a peripheral left-sided bile duct. Gentle hand injection confirmed location and patency with brisk antegrade flow into the duodenum. The drain was locked and reinforced  at the skin entry site with a silk suture. A cap was placed on the drain sterile bandage was applied. The patient tolerated procedure well was transferred back to the floor in good condition. IMPRESSION: 1. Technically successful brush biopsy of the hilar confluence and superior aspect of the common bile duct. 2. Technically successful biliary drain exchange for a new 10.2 [place] biliary drain. The drain was capped. PLAN: Keep the drain capped and internalized unless the patient develops symptoms of recurrent biliary obstruction, in which case the drain should be placed back to bag drainage. Plan follow-up in Interventional Radiology in 8 weeks for biliary drain check and change, or sooner if desired for internal biliary stent placement and removal of percutaneous drain. Ruthann Cancer, MD [facility] Electronically Signed   By: Ruthann Cancer M.D.   On: 02/11/2021 13:33   IR ENDOLUMINAL BX OF BILIARY TREE  Result Date: 02/11/2021 INDICATION: 55 year old female with history of metastatic breast cancer and biliary obstruction status post left percutaneous transhepatic, internal external biliary drain placement on 02/03/2021. EXAM: 1. Cholangiogram. 2. Biliary drain exchange. 3. Biliary brush biopsy. MEDICATIONS: Rocephin 2 gm IV; The antibiotic was administered within an appropriate time frame prior to the initiation of the procedure. ANESTHESIA/SEDATION: Moderate (conscious) sedation was employed during this procedure. A total of Versed 2 mg and Fentanyl 100 mcg was administered intravenously. Moderate Sedation Time: 19 minutes. The patient's level of consciousness and vital signs were monitored continuously by radiology nursing throughout the procedure under my direct supervision. FLUOROSCOPY TIME:  Fluoroscopy Time: 3 minutes 24 seconds (34 mGy). COMPLICATIONS: None immediate. PROCEDURE: Informed written consent was obtained from the patient after a  thorough discussion of the procedural risks, benefits and alternatives. All questions were addressed. Maximal Sterile Barrier Technique was utilized including caps, mask, sterile gowns, sterile gloves, sterile drape, hand hygiene and skin antiseptic. A timeout was performed prior to the initiation of the procedure. The upper abdomen and indwelling left-sided biliary drain were prepped and draped in standard fashion. Preprocedure scout radiograph demonstrated unchanged position of the indwelling biliary drain. Gentle hand injection demonstrated no significant intrahepatic biliary ductal dilation. Subdermal Local anesthesia was administered around the indwelling biliary drain. External suture was cut. The external portion of the tube was cut to release the pigtail and an Amplatz wire was inserted with its tip in the distal duodenum. The tube was removed over the wire. An 8 [place], 25 cm Brite tip sheath was then inserted with the distal tip in the duodenum. The Amplatz wire was exchanged for a 018 glidewire. Next, 3 passes were made near the biliary confluence and superior aspect of the common bile duct with separate brush biopsy sets. The brushes were cut in placed in saline and sent to pathology. The sheath was then readvanced into the small bowel and the Glidewire was exchanged for an Amplatz wire. The sheath was then removed and a new, 10.2 [place] biliary drain was placed with the pigtail portion coiled in the duodenum and the radiopaque marker in a peripheral left-sided bile duct. Gentle hand injection confirmed location and patency with brisk antegrade flow  into the duodenum. The drain was locked and reinforced at the skin entry site with a silk suture. A cap was placed on the drain sterile bandage was applied. The patient tolerated procedure well was transferred back to the floor in good condition. IMPRESSION: 1. Technically successful brush biopsy of the hilar confluence and superior aspect of the common bile  duct. 2. Technically successful biliary drain exchange for a new 10.2 [place] biliary drain. The drain was capped. PLAN: Keep the drain capped and internalized unless the patient develops symptoms of recurrent biliary obstruction, in which case the drain should be placed back to bag drainage. Plan follow-up in Interventional Radiology in 8 weeks for biliary drain check and change, or sooner if desired for internal biliary stent placement and removal of percutaneous drain. Ruthann Cancer, MD [facility] Electronically Signed   By: Ruthann Cancer M.D.   On: 02/11/2021 13:33    Assessment/Plan: Metastatic breast cancer with new onset biliary tract obstruction and right hydronephrosis-creatinine stable this week and improving over the past couple of days.  Currently no indication for ureteral stent.  We will continue to monitor urine output and creatinine.  I will make arrangements to see her back in the office with a renal ultrasound in the next couple of weeks.   LOS: 10 days   Festus Aloe 02/12/2021, 8:30 AM

## 2021-02-12 NOTE — Progress Notes (Signed)
PROGRESS NOTE    [name]  PZW:[id number] DOB: [birth year] DOA: 02/01/2021  PCP: [name], MD   Brief Narrative: This 55 year old Female with PMH significant of metastatic breast cancer with ongoing chemotherapy, Essential hypertension presented to [facility] ED with complaints of generalized pruritus. Initially It was thought to be secondary to immunotherapy Verzenio which was stopped.  Then she started having episodes of nausea,  vomiting, change in her stool color.  Work-up showed elevated total bilirubin.  Subsequently, CT abdomen pelvis showed concerning new right upper quadrant ductal dilation and concerning features of pancreatitis along with possible cholangitis/acute cholecystitis.  Seen by GI, underwent ERCP, unable to cannulate biliary duct.  Patient underwent percutaneous biliary drainage by IR.  Post EUS by GI, Dr. Benson Norway, revealed "one stent was visualized endosonographically in the common bile duct.  Hyperechoic material consistent with sludge was visualized endosonographically in the gallbladder body.  A cystic lesion was seen in the pancreatic head."  GI recommended "- Internalize biliary stent with a removable stent, if possible."  Seen by IR on 02/10/2021 + cholangiogram, biliary brush biopsy of common bile duct, biliary drain exchange.  Right hydronephrosis seen on CT scan, was seen by urology no plan for renal stent placement at this time, she will follow-up with urology Dr. Junious Silk outpatient.    02/12/21: She was seen and examined at bedside.  She reports suprapubic abdominal pain, no radiating.  No nausea.    Streptococcus parasanguinis grew on surgical/deep wound from biliary drainage.  We will continue Rocephin and switch to oral antibiotics on 02/13/21.  Anticipated discharge date 02/13/2021.   Assessment & Plan:   Principal Problem:   Jaundice Active Problems:   Essential hypertension, benign   Malignant neoplasm of upper-outer quadrant of right breast in  female, estrogen receptor positive ([place])   Acute pancreatitis  Biliary obstruction, T bilirubin downtrending: Acute pancreatitis versus metastatic lesion: Patient presented with abdominal pain, nausea, vomiting,  jaundice,  change in stool color and pruritus. CT showed  some evidence of obstruction/inflammation in the right upper quadrant area.  MRCP showed pancreatic head mass concerning for focal acute pancreatitis versus metastatic lesion.   Seen by GI, underwent ERCP.  Unable to cannulate biliary duct. IR was consulted, patient underwent successful percutaneous drainage. 02/03/21 EUS done on 02/09/2021 revealed findings as stated above. Seen by IR on 02/10/2021 + cholangiogram, biliary brush biopsy of common bile duct, biliary drain exchange. Continue pain control, no pain reported on 02/11/2021.  Tolerating a regular diet. Continue IV antiemetics as needed T bilirubin downtrending 4.0 from 5.1 from 6.7. Streptococcus parasanguinis grew on surgical/deep wound from biliary drainage. Continue pain control  We will continue Rocephin and switch to oral antibiotics on 02/13/21.  Anticipated discharge date 02/13/2021.  Right ureteral hydronephrosis: UA negative.  Urine culture not collected. Renal ultrasound shows Severe right-sided hydronephrosis compatible with high-grade stenosis. Seen by urology with plan for outpatient follow-up, no plan for renal stent placement at this time.   Essential hypertension: BP stable. Continue propranolol and Aldactone. Continue to monitor vital signs.  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 [name]. Possible chemotherapy on 02/11/2021.  Anemia of chronic disease Hemoglobin downtrending 9.2 from 10.5. Repeat CBC in the morning No overt bleeding at this time.   Improving AKI Baseline creatinine 0.9 Creatinine downtrending 1.29 from 1.35. Continue to avoid nephrotoxic agents, dehydration and  hypotension. Continue IV fluid hydration  Continue to monitor urine output with strict I's and O's  Improving hyponatremia, hypovolemic: Serum sodium 131 from 129 from 128 Increase oral solute intake. Continue normal saline at 50 cc/h  Physical debility PT assessment is pending. Fall precautions.  DVT prophylaxis: SCDs, subcu Lovenox daily. Code Status: Full code. Family Communication: No family at bed side. Disposition Plan:   Status is: Inpatient  Remains inpatient appropriate because: Chemotherapy 02/11/2021, renal stent placement by urology pending.     Consultants:  GI IR Urology  Procedures: ERCP ,   Percutaneous biliary drain.  Antimicrobials:   Anti-infectives (From admission, onward)    Start     Dose/Rate Route Frequency Ordered Stop   02/12/21 0900  cefTRIAXone (ROCEPHIN) 2 g in sodium chloride 0.9 % 100 mL IVPB        2 g 200 mL/hr over 30 Minutes Intravenous Every 24 hours 02/12/21 0814 02/17/21 0859   02/10/21 1445  cefTRIAXone (ROCEPHIN) 2 g in sodium chloride 0.9 % 100 mL IVPB        2 g 200 mL/hr over 30 Minutes Intravenous To Radiology 02/10/21 1354 02/10/21 1603   02/03/21 1130  cefOXitin (MEFOXIN) 2 g in sodium chloride 0.9 % 100 mL IVPB        2 g 200 mL/hr over 30 Minutes Intravenous  Once 02/03/21 1033 02/03/21 1124        Subjective: General: Well-developed well-nourished in no acute distress.  She is alert oriented x3. Cardiovascular: Regular rate and rhythm no rubs or gallops.  Respiratory: Clear to station no wheeze or rales.   GI: Epigastric tenderness with palpation.  Bowel sounds present.  Nondistended.   Musculoskeletal: No lower extremity edema bilaterally. Psych: Mood is appropriate for condition and setting. Neuro: Moves all 4 extremities bilaterally.  Nonfocal.   Skin: No rash, no ulcerative lesions noted.  Objective: Vitals:   02/11/21 0707 02/11/21 1536 02/11/21 2048 02/12/21 0609  BP: 101/73 129/84 122/76 132/89   Pulse: 99 86 84 82  Resp: 18 16 19 19   Temp: 98.2 F (36.8 C) 98.6 F (37 C) 98.2 F (36.8 C) 98.3 F (36.8 C)  TempSrc: Oral Oral Oral Oral  SpO2: 100% 98% 100% 98%  Weight:      Height:        Intake/Output Summary (Last 24 hours) at 02/12/2021 1018 Last data filed at 02/11/2021 1240 Gross per 24 hour  Intake 240 ml  Output --  Net 240 ml   Filed Weights   02/01/21 1300 02/02/21 1347  Weight: 91.2 kg 91.2 kg    Examination:  General exam: Appears comfortable, deconditioned, not in any distress. Respiratory system: CTA bilaterally. Respiratory effort normal. RR 15 Cardiovascular system: S1-S2 heard, regular rate and rhythm, no murmur. Chest: Chemo-Port noted in the left chest. Gastrointestinal system: Abdomen is soft, mildly tender around biliary drain area.  Mildly distended, BS+.   Biliary drain noted with clear yellow-colored fluid. Central nervous system: Alert and oriented x 3. No focal neurological deficits. Extremities: No edema, no cyanosis, no clubbing. Skin: No rashes, lesions or ulcers Psychiatry: Judgement and insight appear normal. Mood & affect appropriate.     Data Reviewed: I have personally reviewed following labs and imaging studies  CBC: Recent Labs  Lab 02/08/21 0600 02/09/21 0532 02/10/21 0505 02/11/21 0604 02/12/21 0335  WBC 13.3* 13.5* 14.8* 16.0* 12.9*  NEUTROABS  --   --   --   --  10.2*  HGB 10.6* 10.9* 10.5* 10.5* 9.2*  HCT 33.0* 33.0* 32.5* 31.3* 28.7*  MCV 100.9* 100.6* 100.9* 101.3*  102.5*  PLT 387 413* 481* 440* 620   Basic Metabolic Panel: Recent Labs  Lab 02/06/21 0604 02/07/21 0521 02/07/21 0521 02/08/21 0600 02/09/21 0532 02/10/21 0505 02/11/21 0604 02/12/21 0335  NA  --  130*   < > 134* 128* 128* 129* 131*  K  --  4.0   < > 3.1* 4.2 3.9 4.3 4.0  CL  --  93*   < > 105 94* 98 99 101  CO2  --  28   < > 21* 26 20* 24 24  GLUCOSE  --  118*   < > 106* 123* 127* 114* 118*  BUN  --  26*   < > 20 22* 26* 21* 13   CREATININE  --  1.25*   < > 0.93 1.33* 1.35* 1.29* 1.07*  CALCIUM  --  9.9   < > 7.7* 10.2 9.2 9.5 9.1  MG 2.2 2.2  --  1.9 2.4 2.4  --   --   PHOS  --  3.8  --   --  3.1 3.1  --   --    < > = values in this interval not displayed.   GFR: Estimated Creatinine Clearance: 73.6 mL/min (A) (by C-G formula based on SCr of 1.07 mg/dL (H)). Liver Function Tests: Recent Labs  Lab 02/08/21 0600 02/09/21 0532 02/10/21 0505 02/11/21 0604 02/12/21 0335  AST 29 39 37 35 37  ALT 27 40 40 40 37  ALKPHOS 179* 229* 192* 216* 184*  BILITOT 6.0* 6.7* 5.1* 4.0* 3.1*  PROT 6.1* 7.9 7.0 7.4 6.9  ALBUMIN 1.9* 2.5* 2.4* 2.4* 2.3*   No results for input(s): LIPASE, AMYLASE in the last 168 hours.  No results for input(s): AMMONIA in the last 168 hours. Coagulation Profile: No results for input(s): INR, PROTIME in the last 168 hours.  Cardiac Enzymes: No results for input(s): CKTOTAL, CKMB, CKMBINDEX, TROPONINI in the last 168 hours. BNP (last 3 results) No results for input(s): PROBNP in the last 8760 hours. HbA1C: No results for input(s): HGBA1C in the last 72 hours. CBG: No results for input(s): GLUCAP in the last 168 hours. Lipid Profile: No results for input(s): CHOL, HDL, LDLCALC, TRIG, CHOLHDL, LDLDIRECT in the last 72 hours. Thyroid Function Tests: No results for input(s): TSH, T4TOTAL, FREET4, T3FREE, THYROIDAB in the last 72 hours.  Anemia Panel: No results for input(s): VITAMINB12, FOLATE, FERRITIN, TIBC, IRON, RETICCTPCT in the last 72 hours. Sepsis Labs: No results for input(s): PROCALCITON, LATICACIDVEN in the last 168 hours.  Recent Results (from the past 240 hour(s))  Aerobic/Anaerobic Culture w Gram Stain (surgical/deep wound)     Status: None   Collection Time: 02/03/21 11:18 AM   Specimen: BILE  Result Value Ref Range Status   Specimen Description   Final    BILE Performed at [facility] [address] [place], [place] [address]    Special  Requests   Final    NONE Performed at [facility], [place] [address], [place], [place] [address]    Gram Stain   Final    RARE WBC PRESENT,BOTH PMN AND MONONUCLEAR NO ORGANISMS SEEN    Culture   Final    RARE STREPTOCOCCUS PARASANGUINIS NO ANAEROBES ISOLATED Performed at [facility], [place] [address], [place], [place] [address]    Report Status 02/08/2021 FINAL  Final   Organism ID, Bacteria STREPTOCOCCUS PARASANGUINIS  Final      Susceptibility   Streptococcus parasanguinis -  MIC*    PENICILLIN 0.25 INTERMEDIATE Intermediate     CEFTRIAXONE <=0.12 SENSITIVE Sensitive     LEVOFLOXACIN >=16 RESISTANT Resistant     VANCOMYCIN 0.5 SENSITIVE Sensitive     * RARE STREPTOCOCCUS PARASANGUINIS    Radiology Studies: IR EXCHANGE BILIARY DRAIN  Result Date: 02/11/2021 INDICATION: 55 year old female with history of metastatic breast cancer and biliary obstruction status post left percutaneous transhepatic, internal external biliary drain placement on 02/03/2021. EXAM: 1. Cholangiogram. 2. Biliary drain exchange. 3. Biliary brush biopsy. MEDICATIONS: Rocephin 2 gm IV; The antibiotic was administered within an appropriate time frame prior to the initiation of the procedure. ANESTHESIA/SEDATION: Moderate (conscious) sedation was employed during this procedure. A total of Versed 2 mg and Fentanyl 100 mcg was administered intravenously. Moderate Sedation Time: 19 minutes. The patient's level of consciousness and vital signs were monitored continuously by radiology nursing throughout the procedure under my direct supervision. FLUOROSCOPY TIME:  Fluoroscopy Time: 3 minutes 24 seconds (34 mGy). COMPLICATIONS: None immediate. PROCEDURE: Informed written consent was obtained from the patient after a thorough discussion of the procedural risks, benefits and alternatives. All questions were addressed. Maximal Sterile Barrier Technique was utilized including caps, mask, sterile gowns, sterile  gloves, sterile drape, hand hygiene and skin antiseptic. A timeout was performed prior to the initiation of the procedure. The upper abdomen and indwelling left-sided biliary drain were prepped and draped in standard fashion. Preprocedure scout radiograph demonstrated unchanged position of the indwelling biliary drain. Gentle hand injection demonstrated no significant intrahepatic biliary ductal dilation. Subdermal Local anesthesia was administered around the indwelling biliary drain. External suture was cut. The external portion of the tube was cut to release the pigtail and an Amplatz wire was inserted with its tip in the distal duodenum. The tube was removed over the wire. An 8 [place], 25 cm Brite tip sheath was then inserted with the distal tip in the duodenum. The Amplatz wire was exchanged for a 018 glidewire. Next, 3 passes were made near the biliary confluence and superior aspect of the common bile duct with separate brush biopsy sets. The brushes were cut in placed in saline and sent to pathology. The sheath was then readvanced into the small bowel and the Glidewire was exchanged for an Amplatz wire. The sheath was then removed and a new, 10.2 [place] biliary drain was placed with the pigtail portion coiled in the duodenum and the radiopaque marker in a peripheral left-sided bile duct. Gentle hand injection confirmed location and patency with brisk antegrade flow into the duodenum. The drain was locked and reinforced at the skin entry site with a silk suture. A cap was placed on the drain sterile bandage was applied. The patient tolerated procedure well was transferred back to the floor in good condition. IMPRESSION: 1. Technically successful brush biopsy of the hilar confluence and superior aspect of the common bile duct. 2. Technically successful biliary drain exchange for a new 10.2 [place] biliary drain. The drain was capped. PLAN: Keep the drain capped and internalized unless the patient develops  symptoms of recurrent biliary obstruction, in which case the drain should be placed back to bag drainage. Plan follow-up in Interventional Radiology in 8 weeks for biliary drain check and change, or sooner if desired for internal biliary stent placement and removal of percutaneous drain. Ruthann Cancer, MD [facility] Radiology Electronically Signed   By: Ruthann Cancer M.D.   On: 02/11/2021 13:33   IR ENDOLUMINAL BX OF BILIARY TREE  Result  Date: 02/11/2021 INDICATION: 55 year old female with history of metastatic breast cancer and biliary obstruction status post left percutaneous transhepatic, internal external biliary drain placement on 02/03/2021. EXAM: 1. Cholangiogram. 2. Biliary drain exchange. 3. Biliary brush biopsy. MEDICATIONS: Rocephin 2 gm IV; The antibiotic was administered within an appropriate time frame prior to the initiation of the procedure. ANESTHESIA/SEDATION: Moderate (conscious) sedation was employed during this procedure. A total of Versed 2 mg and Fentanyl 100 mcg was administered intravenously. Moderate Sedation Time: 19 minutes. The patient's level of consciousness and vital signs were monitored continuously by radiology nursing throughout the procedure under my direct supervision. FLUOROSCOPY TIME:  Fluoroscopy Time: 3 minutes 24 seconds (34 mGy). COMPLICATIONS: None immediate. PROCEDURE: Informed written consent was obtained from the patient after a thorough discussion of the procedural risks, benefits and alternatives. All questions were addressed. Maximal Sterile Barrier Technique was utilized including caps, mask, sterile gowns, sterile gloves, sterile drape, hand hygiene and skin antiseptic. A timeout was performed prior to the initiation of the procedure. The upper abdomen and indwelling left-sided biliary drain were prepped and draped in standard fashion. Preprocedure scout radiograph demonstrated unchanged position of the  indwelling biliary drain. Gentle hand injection demonstrated no significant intrahepatic biliary ductal dilation. Subdermal Local anesthesia was administered around the indwelling biliary drain. External suture was cut. The external portion of the tube was cut to release the pigtail and an Amplatz wire was inserted with its tip in the distal duodenum. The tube was removed over the wire. An 8 [place], 25 cm Brite tip sheath was then inserted with the distal tip in the duodenum. The Amplatz wire was exchanged for a 018 glidewire. Next, 3 passes were made near the biliary confluence and superior aspect of the common bile duct with separate brush biopsy sets. The brushes were cut in placed in saline and sent to pathology. The sheath was then readvanced into the small bowel and the Glidewire was exchanged for an Amplatz wire. The sheath was then removed and a new, 10.2 [place] biliary drain was placed with the pigtail portion coiled in the duodenum and the radiopaque marker in a peripheral left-sided bile duct. Gentle hand injection confirmed location and patency with brisk antegrade flow into the duodenum. The drain was locked and reinforced at the skin entry site with a silk suture. A cap was placed on the drain sterile bandage was applied. The patient tolerated procedure well was transferred back to the floor in good condition. IMPRESSION: 1. Technically successful brush biopsy of the hilar confluence and superior aspect of the common bile duct. 2. Technically successful biliary drain exchange for a new 10.2 [place] biliary drain. The drain was capped. PLAN: Keep the drain capped and internalized unless the patient develops symptoms of recurrent biliary obstruction, in which case the drain should be placed back to bag drainage. Plan follow-up in Interventional Radiology in 8 weeks for biliary drain check and change, or sooner if desired for internal biliary stent placement and removal of percutaneous drain. Ruthann Cancer,  MD [facility] Radiology Electronically Signed   By: Ruthann Cancer M.D.   On: 02/11/2021 13:33    Scheduled Meds:  Chlorhexidine Gluconate Cloth  6 each Topical Daily   cholecalciferol  5,000 Units Oral q morning   enoxaparin (LOVENOX) injection  40 mg Subcutaneous Q24H   multivitamin with minerals  1 tablet Oral Daily   propranolol  10 mg Oral BID   sodium chloride flush  5 mL Intracatheter Q8H  spironolactone  25 mg Oral BID   vitamin B-12  5,000 mcg Oral q morning   Continuous Infusions:  sodium chloride 50 mL/hr at 02/10/21 0557   cefTRIAXone (ROCEPHIN)  IV     promethazine (PHENERGAN) injection (IM or IVPB)       LOS: 10 days    Time spent: 25 mins    Kayleen Memos, MD [facility]   If 7PM-7AM, please contact night-coverage

## 2021-02-12 NOTE — Evaluation (Signed)
Physical Therapy One Time Evaluation Patient Details Name: [name] MRN: [id number] DOB: 03/10/1966 Today's Date: 02/12/2021  History of Present Illness  55 year old Female with PMH significant of metastatic breast cancer with ongoing chemotherapy, Essential hypertension presented to [facility] ED with complaints of generalized pruritus. Initially It was thought to be secondary to immunotherapy Verzenio which was stopped.  Then she started having episodes of nausea,  vomiting, change in her stool color.  Work-up showed elevated total bilirubin.  Subsequently, CT abdomen pelvis showed concerning new right upper quadrant ductal dilation and concerning features of pancreatitis along with possible cholangitis/acute cholecystitis.  Seen by GI, underwent ERCP, unable to cannulate biliary duct.  Patient underwent percutaneous biliary drainage by IR.  Clinical Impression  Patient evaluated by Physical Therapy with no further acute PT needs identified. All education has been completed and the patient has no further questions.  Pt reports only mobilizing in room since admission (02/01/21).  Pt reports limited endurance and fatiguing quickly from immobility and also after radiation treatments (which are weekdays in the morning).  Pt encouraged to ambulate as tolerated with nursing staff and mobility specialist and take rest breaks as needed.  Pt reports she has been to OPPT for lymphedema treatment however also recommended for OPPT for improving strength and endurance while receiving cancer treatments.  Pt agreeable to acute PT signing off.  Pt denies any equipment needs at home. PT is signing off. Thank you for this referral.      Recommendations for follow up therapy are one component of a multi-disciplinary discharge planning process, led by the attending physician.  Recommendations may be updated based on patient status, additional functional criteria and insurance authorization.  Follow Up Recommendations  Outpatient PT    Assistance Recommended at Discharge PRN  Functional Status Assessment Patient has had a recent decline in their functional status and demonstrates the ability to make significant improvements in function in a reasonable and predictable amount of time.  Equipment Recommendations  None recommended by PT    Recommendations for Other Services       Precautions / Restrictions Precautions Precaution Comments: monitor HR      Mobility  Bed Mobility Overal bed mobility: Modified Independent                  Transfers Overall transfer level: Modified independent                      Ambulation/Gait Ambulation/Gait assistance: Supervision Gait Distance (Feet): 180 Feet Assistive device: None Gait Pattern/deviations: Step-through pattern;Decreased stride length       General Gait Details: pt reports endurance is an issue, required multiple standing rest breaks due to SOB and fatigue, HR 108-113 bpm during session (had been 147-163 bpm earlier in the morning when pt was in pain)  Stairs            Wheelchair Mobility    Modified Rankin (Stroke Patients Only)       Balance                                             Pertinent Vitals/Pain Pain Assessment: No/denies pain    Home Living Family/patient expects to be discharged to:: Private residence Living Arrangements: Children Available Help at Discharge: Family;Available 24 hours/day Type of Home: House Home Access: Stairs to enter   Entrance  Stairs-Number of Steps: 3   Home Layout: One level Home Equipment: None      Prior Function Prior Level of Function : Independent/Modified Independent                     Hand Dominance        Extremity/Trunk Assessment        Lower Extremity Assessment Lower Extremity Assessment: Generalized weakness    Cervical / Trunk Assessment Cervical / Trunk Assessment: Normal  Communication    Communication: No difficulties  Cognition Arousal/Alertness: Awake/alert Behavior During Therapy: WFL for tasks assessed/performed Overall Cognitive Status: Within Functional Limits for tasks assessed                                          General Comments      Exercises     Assessment/Plan    PT Assessment All further PT needs can be met in the next venue of care  PT Problem List Decreased mobility;Decreased activity tolerance       PT Treatment Interventions      PT Goals (Current goals can be found in the Care Plan section)  Acute Rehab PT Goals PT Goal Formulation: All assessment and education complete, DC therapy    Frequency     Barriers to discharge        Co-evaluation               AM-PAC PT "6 Clicks" Mobility  Outcome Measure Help needed turning from your back to your side while in a flat bed without using bedrails?: None Help needed moving from lying on your back to sitting on the side of a flat bed without using bedrails?: None Help needed moving to and from a bed to a chair (including a wheelchair)?: None Help needed standing up from a chair using your arms (e.g., wheelchair or bedside chair)?: None Help needed to walk in hospital room?: A Little Help needed climbing 3-5 steps with a railing? : A Little 6 Click Score: 22    End of Session Equipment Utilized During Treatment: Gait belt Activity Tolerance: Patient tolerated treatment well Patient left: with call bell/phone within reach;in bed   PT Visit Diagnosis: Muscle weakness (generalized) (M62.81)    Time: 2355-7322 PT Time Calculation (min) (ACUTE ONLY): 13 min   Charges:   PT Evaluation $PT Eval Low Complexity: 1 Low        Kati PT, DPT Acute Rehabilitation Services Pager: [phone] Office: [phone]   [name] 02/12/2021, 12:27 PM

## 2021-02-13 ENCOUNTER — Other Ambulatory Visit: Payer: Self-pay | Admitting: Physician Assistant

## 2021-02-13 DIAGNOSIS — R17 Unspecified jaundice: Secondary | ICD-10-CM | POA: Diagnosis not present

## 2021-02-13 LAB — COMPREHENSIVE METABOLIC PANEL
ALT: 42 U/L (ref 0–44)
AST: 40 U/L (ref 15–41)
Albumin: 2.4 g/dL — ABNORMAL LOW (ref 3.5–5.0)
Alkaline Phosphatase: 175 U/L — ABNORMAL HIGH (ref 38–126)
Anion gap: 3 — ABNORMAL LOW (ref 5–15)
BUN: 10 mg/dL (ref 6–20)
CO2: 23 mmol/L (ref 22–32)
Calcium: 8.8 mg/dL — ABNORMAL LOW (ref 8.9–10.3)
Chloride: 104 mmol/L (ref 98–111)
Creatinine, Ser: 1.04 mg/dL — ABNORMAL HIGH (ref 0.44–1.00)
GFR, Estimated: >60 mL/min (ref >60)
Glucose, Bld: 119 mg/dL — ABNORMAL HIGH (ref 70–99)
Potassium: 3.9 mmol/L (ref 3.5–5.1)
Sodium: 130 mmol/L — ABNORMAL LOW (ref 135–145)
Total Bilirubin: 2.8 mg/dL — ABNORMAL HIGH (ref 0.3–1.2)
Total Protein: 6.4 g/dL — ABNORMAL LOW (ref 6.5–8.1)

## 2021-02-13 LAB — CBC
HCT: 28.3 % — ABNORMAL LOW (ref 36.0–46.0)
Hemoglobin: 9.2 g/dL — ABNORMAL LOW (ref 12.0–15.0)
MCH: 33.2 pg (ref 26.0–34.0)
MCHC: 32.5 g/dL (ref 30.0–36.0)
MCV: 102.2 fL — ABNORMAL HIGH (ref 80.0–100.0)
Platelets: 403 10*3/uL — ABNORMAL HIGH (ref 150–400)
RBC: 2.77 MIL/uL — ABNORMAL LOW (ref 3.87–5.11)
RDW: 12.6 % (ref 11.5–15.5)
WBC: 11.5 10*3/uL — ABNORMAL HIGH (ref 4.0–10.5)
nRBC: 0 % (ref 0.0–0.2)

## 2021-02-13 MED ORDER — BACITRACIN-NEOMYCIN-POLYMYXIN OINTMENT TUBE: 1.0000 "application " | TOPICAL_OINTMENT | Freq: Two times a day (BID) | CUTANEOUS | 0 refills | Status: AC

## 2021-02-13 MED ORDER — SIMETHICONE 40 MG/0.6ML PO SUSP
80.0000 mg | ORAL | Status: AC
  Administered 2021-02-13: 80 mg via ORAL
  Filled 2021-02-13: qty 1.2

## 2021-02-13 MED ORDER — CEPHALEXIN 500 MG PO CAPS
500.0000 mg | ORAL_CAPSULE | Freq: Three times a day (TID) | ORAL | 0 refills | Status: AC
Start: 2021-02-14 — End: 2021-02-19

## 2021-02-13 MED ORDER — SODIUM CHLORIDE 0.9 % IJ SOLN
3.0000 mL | Freq: Every day | INTRAMUSCULAR | 99 refills | Status: DC
  Filled 2021-02-13: qty 30, 10d supply, fill #0

## 2021-02-13 MED ORDER — HEPARIN SOD (PORK) LOCK FLUSH 100 UNIT/ML IV SOLN
500.0000 [IU] | Freq: Once | INTRAVENOUS | Status: AC
  Administered 2021-02-13: 500 [IU] via INTRAVENOUS
  Filled 2021-02-13: qty 5

## 2021-02-13 MED ORDER — CEPHALEXIN 500 MG PO CAPS: 500.0000 mg | ORAL_CAPSULE | Freq: Three times a day (TID) | ORAL | Status: DC

## 2021-02-13 MED ORDER — SODIUM CHLORIDE 0.9 % IV SOLN
2.0000 g | Freq: Once | INTRAVENOUS | Status: AC
  Administered 2021-02-13: 2 g via INTRAVENOUS
  Filled 2021-02-13: qty 20

## 2021-02-13 NOTE — TOC Transition Note (Signed)
Transition of Care [facility]) - CM/SW Discharge Note   Patient Details  Name: MARIELIS SAMARA MRN: [id number] Date of Birth: [birth year]  Transition of Care [facility]) CM/SW Contact:  Iona Beard, [place] Phone Number: 02/13/2021, 11:20 AM   Clinical Narrative:    TOC notified of pts need for [facility] RN in assistance with biliary drain. CSW reached out to pt to compelte assessment. Pt states that her son lives with her. Pt is independent in completing her ADLs and her son will be providing her needed transportation. Pt has not had [facility] services in the past. Pt does not use any DME in the home. CSW spoke with pt about interest in [facility] services, she is interested. CSW spoke with [facility] Core[facility] Cornea who states they can accept pt however she will have about a $100 copay per visit and they cannot start care until Monday. CSW updated pt of cost of copay and she is agreeable to paying this. CSW also updated MD and RN that care would not start until Monday. TOC signing off.   Final next level of care: [place] Barriers to Discharge: Barriers Resolved   Patient Goals and CMS Choice Patient states their goals for this hospitalization and ongoing recovery are:: Return home with [facility] CMS Medicare.gov Compare Post Acute Care list provided to:: Patient Choice offered to / list presented to : Patient  Discharge Placement                       Discharge Plan and Services                          HH Arranged: RN, PT [facility] Agency: [place] ([place]) Date [place]: 02/13/21   Representative spoke with at [place]: [place] ([place]) Interventions     Readmission Risk Interventions No flowsheet data found.

## 2021-02-13 NOTE — Progress Notes (Signed)
  Asked by RN for drain care teaching.  I demonstrated to patient how to flush drain, instructed her to flush daily witih about 3-5 mL NS.  I will send in Rx for flush to [facility] out patient pharmacy.  RN will give a couple of flush so patient will have them until she can get to the pharmacy.  I also demonstrated how to re-attach a bag if she develops symptoms of biliary obstruction (jaundice, pain, N/V).  F/U appointment is in place.  Rumaysa Sabatino S Dewanda Fennema PA-C 02/13/2021 11:50 AM

## 2021-02-13 NOTE — Plan of Care (Signed)
Goals adequate for DC

## 2021-02-13 NOTE — Progress Notes (Signed)
Mobility Specialist - Progress Note    02/13/21 1151  Mobility  Activity Ambulated in hall  Level of Assistance Modified independent, requires aide device or extra time  Assistive Device Other (Comment) (IV Pole)  Distance Ambulated (ft) 380 ft  Mobility Ambulated independently in hallway  Mobility Response Tolerated well  Mobility performed by Mobility specialist  $Mobility charge 1 Mobility   Upon entering, pt was agreeable to mobilize and required no assistance to stand from EOB. While ambulating 380 ft pt pushed IV pole and RN stopped pt and readjusted IV lines.Once done, pt returned to room and was left sitting EOB with call bell at side.   [place] Specialist Acute Rehabilitation Services Phone: [phone] 02/13/21, 11:58 AM

## 2021-02-13 NOTE — Discharge Summary (Signed)
Discharge Summary  [name]:[id number] DOB: September 18, 1965  PCP: [name], MD  Admit date: 02/01/2021 Discharge date: 02/13/2021  Time spent: 35 minutes.  Recommendations for Outpatient Follow-up:  Follow-up with medical oncology Follow-up with interventional neurology Follow-up with your primary care provider Take your medications as prescribed Continue PT OT with assistance and fall precautions.   Discharge Diagnoses:  Active Hospital Problems   Diagnosis Date Noted   Jaundice 02/01/2021   Acute pancreatitis 02/02/2021   Malignant neoplasm of upper-outer quadrant of right breast in female, estrogen receptor positive ([place]) 04/03/2020   Essential hypertension, benign 02/11/2013    Resolved Hospital Problems  No resolved problems to display.    Discharge Condition: Stable  Diet recommendation: Resume previous diet.  Vitals:   02/12/21 2053 02/13/21 0502  BP: 116/78 119/82  Pulse: 82 84  Resp: 19 17  Temp: 98.2 F (36.8 C) 98.4 F (36.9 C)  SpO2: 100% 98%    History of present illness:  This 55 year old Female with PMH significant of metastatic breast cancer with ongoing radiation therapy, Essential hypertension presented to [facility] ED with complaints of generalized pruritus. Initially It was thought to be secondary to immunotherapy Verzenio which was stopped.  Then she started having episodes of nausea,  vomiting, change in her stool color.  Work-up showed elevated total bilirubin.  Subsequently, CT abdomen pelvis was done and was concerning for new right upper quadrant ductal dilation and concerning features of pancreatitis along with possible cholangitis/acute cholecystitis.    Seen by GI, underwent ERCP.  GI was unable to cannulate biliary duct.  Patient underwent percutaneous biliary drainage by IR.  Post EUS by GI, Dr. Benson Norway "one stent was visualized endosonographically in the common bile duct.  Hyperechoic material consistent with sludge was visualized  endosonographically in the gallbladder body.  A cystic lesion was seen in the pancreatic head."  GI recommended "- Internalize biliary stent with a removable stent, if possible."  Seen by IR on 02/10/2021, post cholangiogram, biliary brush biopsy of common bile duct, biliary drain exchange.  Streptococcus parasanguinis grew on surgical/deep wound from biliary drainage.  She received Rocephin on 12/2 and 02/13/21.  Switched to Keflex at discharge.  02/13/2021: Seen at bedside.  No new complaints.  IR demonstrated to patient how to flush drain.  Flush daily witih about 3-5 mL NS.  Follow-up with IR outpatient.     Hospital Course:  Principal Problem:   Jaundice Active Problems:   Essential hypertension, benign   Malignant neoplasm of upper-outer quadrant of right breast in female, estrogen receptor positive ([place])   Acute pancreatitis  Biliary obstruction, T bilirubin downtrending: Acute pancreatitis versus metastatic lesion: Patient presented with abdominal pain, nausea, vomiting,  jaundice,  change in stool color and pruritus. CT showed  some evidence of obstruction/inflammation in the right upper quadrant area.  MRCP showed pancreatic head mass concerning for focal acute pancreatitis versus metastatic lesion.   Seen by GI, underwent ERCP.  Unable to cannulate biliary duct. IR was consulted, patient underwent successful percutaneous drainage. 02/03/21 EUS done on 02/09/2021 revealed findings as stated above. Seen by IR on 02/10/2021, post cholangiogram, biliary brush biopsy of common bile duct, biliary drain exchange. T bilirubin downtrending 2.8 from 3.1 from 4.0 from 5.1 from 6.7. Streptococcus parasanguinis grew on surgical/deep wound from biliary drainage. Received Rocephin 12/2 and 02/13/21. Switched to Keflex at discharge. Follow-up with GI, IR.   Right ureteral hydronephrosis: UA negative.  Urine culture not collected. Renal ultrasound shows Severe  right-sided hydronephrosis  compatible with high-grade stenosis. Seen by urology with plan for outpatient follow-up, no plan for renal stent placement at this time. Follow-up with urology.   Essential hypertension: BP stable. Continue propranolol and Aldactone. Follow-up with your primary care provider.   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 Follow-up with oncology.   Anemia of chronic disease Hemoglobin 9.2 from 10.5. No overt bleeding at this time. Follow-up with medical oncology and your primary care provider.   Resolved AKI Baseline creatinine 0.9 Creatinine downtrending 1.0 with GFR greater than 60 from creatinine 1.29 from 1.35. Continue to avoid nephrotoxic agents, dehydration and hypotension.   Improving hyponatremia, hypovolemic: Serum sodium 131 from 129 from 128 Increase oral solute intake.   Physical debility PT recommended home health PT. Continue fall precautions.  Code Status: Full code.    Consultants:  GI IR Urology   Procedures: ERCP ,   Percutaneous biliary drain.   Antimicrobials:   Anti-infectives (From admission, onward)        Start     Dose/Rate Route Frequency Ordered Stop    02/12/21 0900   cefTRIAXone (ROCEPHIN) 2 g in sodium chloride 0.9 % 100 mL IVPB        2 g 200 mL/hr over 30 Minutes Intravenous Every 24 hours 02/12/21 0814 02/17/21 0859    02/10/21 1445   cefTRIAXone (ROCEPHIN) 2 g in sodium chloride 0.9 % 100 mL IVPB        2 g 200 mL/hr over 30 Minutes Intravenous To Radiology 02/10/21 1354 02/10/21 1603    02/03/21 1130   cefOXitin (MEFOXIN) 2 g in sodium chloride 0.9 % 100 mL IVPB        2 g 200 mL/hr over 30 Minutes Intravenous  Once 02/03/21 1033 02/03/21 1124             Discharge Exam: BP 119/82   Pulse 84   Temp 98.4 F (36.9 C) (Oral)   Resp 17   Ht 5\' 10"  (1.778 m)   Wt 91.2 kg   SpO2 98%   BMI 28.84 kg/m  General: 55 y.o. year-old female well developed well nourished in no  acute distress.  Alert and oriented x3. Cardiovascular: Regular rate and rhythm with no rubs or gallops.  No thyromegaly or JVD noted.   Respiratory: Clear to auscultation with no wheezes or rales. Good inspiratory effort. Abdomen: Soft nontender nondistended with normal bowel sounds x4 quadrants. Musculoskeletal: No lower extremity edema. 2/4 pulses in all 4 extremities. Skin: No ulcerative lesions noted or rashes, Psychiatry: Mood is appropriate for condition and setting  Discharge Instructions You were cared for by a hospitalist during your hospital stay. If you have any questions about your discharge medications or the care you received while you were in the hospital after you are discharged, you can call the unit and asked to speak with the hospitalist on call if the hospitalist that took care of you is not available. Once you are discharged, your primary care physician will handle any further medical issues. Please note that NO REFILLS for any discharge medications will be authorized once you are discharged, as it is imperative that you return to your primary care physician (or establish a relationship with a primary care physician if you do not have one) for your aftercare needs so that they can reassess your need for medications and monitor your lab values.  Discharge Instructions     Call MD for:  difficulty  breathing, headache or visual disturbances   Complete by: As directed    Call MD for:  persistant dizziness or light-headedness   Complete by: As directed    Call MD for:  persistant nausea and vomiting   Complete by: As directed    Diet - low sodium heart healthy   Complete by: As directed    Discharge instructions   Complete by: As directed    Advised to follow-up with primary care physician in 1 week. Advised to follow-up with Dr. [name] oncologist this week. Advised to follow-up with IR with percutaneous biliary drain.   Discharge wound care:   Complete by: As directed     Follow-up IR for percutaneous drain.   Increase activity slowly   Complete by: As directed       Allergies as of 02/13/2021   No Known Allergies      Medication List     STOP taking these medications    cholestyramine 4 g packet Commonly known as: Questran       TAKE these medications    acetaminophen 500 MG tablet Commonly known as: TYLENOL Take 1,000 mg by mouth every 6 (six) hours as needed for moderate pain.   B-12 5000 MCG Caps Take 5,000 mcg by mouth every morning.   Boric Acid Vaginal 600 MG Supp Place 600 mg vaginally daily as needed (yeast infections).   cephALEXin 500 MG capsule Commonly known as: KEFLEX Take 1 capsule (500 mg total) by mouth every 8 (eight) hours for 5 days. Start taking on: February 14, 2021   clobetasol ointment 0.05 % Commonly known as: TEMOVATE Apply 1 application topically 2 (two) times daily as needed (rash/itching).   loperamide 2 MG capsule Commonly known as: IMODIUM Take 2 tablets after first diarrheal bowel movement, then one tablet after each following diarrheal movement; maximum 6 tablets/ day What changed:  how much to take when to take this additional instructions   methocarbamol 500 MG tablet Commonly known as: ROBAXIN Take 1 tablet (500 mg total) by mouth every 6 (six) hours as needed for muscle spasms.   neomycin-bacitracin-polymyxin Oint Commonly known as: NEOSPORIN Apply 1 application topically 2 (two) times daily. Notes to patient: Apply to radiation treatment areas    prochlorperazine 10 MG tablet Commonly known as: COMPAZINE Take 1 tablet (10 mg total) by mouth every 6 (six) hours as needed for nausea or vomiting.   propranolol 10 MG tablet Commonly known as: INDERAL Take 10 mg by mouth 2 (two) times daily.   spironolactone 25 MG tablet Commonly known as: ALDACTONE Take 25 mg by mouth 2 (two) times daily.   Vitamin D 125 MCG (5000 UT) Caps Take 5,000 Units by mouth every morning.                Discharge Care Instructions  (From admission, onward)           Start     Ordered   02/08/21 0000  Discharge wound care:       Comments: Follow-up IR for percutaneous drain.   02/08/21 1513           No Known Allergies  Follow-up Information     Festus Aloe, MD. Call.   Specialty: Urology Contact information: [place] [address] [phone]         [place] Follow up in 7 day(s).   Why: Follow up for evaluation of drain in 7-10 days. Continue to flush  once daily Change dressing as needed  Record daily output from drain Call clinic for other questions or concerns Contact information: [place] [address] [phone]         [name], MD Follow up in 1 week(s).   Specialty: Family Medicine Contact information: [place] [address] [phone]         Magrinat, Virgie Dad, MD Follow up in 1 week(s).   Specialty: Oncology Contact information: [place] [address] [phone]         Carol Ada, MD. Call today.   Specialty: Gastroenterology Why: Please call for a post hospital follow-up appointment. Contact information: [place], [place] [address] [phone]                  The results of significant diagnostics from this hospitalization (including imaging, microbiology, ancillary and laboratory) are listed below for reference.    Significant Diagnostic Studies: CT ABDOMEN PELVIS WO CONTRAST  Result Date: 02/06/2021 CLINICAL DATA:  History of metastatic breast cancer with jaundice, post placement of percutaneous biliary drainage catheter on 02/03/2021 EXAM: CT ABDOMEN AND PELVIS WITHOUT CONTRAST TECHNIQUE: Multidetector CT imaging of the abdomen and pelvis was performed following the standard protocol without IV contrast. COMPARISON:  CT abdomen and pelvis-02/01/2021;  12/29/2020 MRCP-02/01/2021 image guided placement of percutaneous biliary drainage catheter-02/03/2021 FINDINGS: Lower chest: Limited visualization of the lower thorax demonstrates bibasilar subsegmental atelectasis, left greater than right. No pleural effusion. The tip of a port a catheter terminates at the level of the superior cavoatrial junction. Normal heart size. Trace amount of pericardial fluid, presumably physiologic. There is diffuse decreased attenuation of the intra cardiac blood pool suggestive of anemia. Coronary artery calcifications. Hepatobiliary: Interval placement of left hepatic lobe approach internal/external biliary drainage catheter with radiopaque side marker located at the level of the biliary hilum and distal end appropriately positioned within the descending portion of the duodenum. Evaluation for residual intrahepatic biliary ductal dilatation is degraded secondary to the lack of intravenous contrast. The gallbladder is filled with radiopaque material, likely contrast from placement of the internal/external biliary drainage catheter. No definitive gallbladder wall thickening or pericholecystic stranding on this noncontrast examination. No ascites. Pancreas: Questioned pancreatic head mass on preceding MRCP is suboptimally evaluated on this noncontrast examination (image 38, series 2), however there has been development of perihepatic ascites seen both at the level of the gastrohepatic ligament (image 29, series 2) as well as within the adjacent ventral aspect of the abdominal mesentery (image 43, series 2). While incompletely evaluated this noncontrast examination there is no definitive organization of this fluid to suggest the presence of a pseudo cyst. Spleen: Normal noncontrast appearance of the spleen. Adrenals/Urinary Tract: Redemonstrated moderate right-sided ureterectasis and pelvicaliectasis with abrupt tapering at the mid aspect of the right ureter (image 62, series 2). Again,  the etiology of this obstruction is not depicted on this examination, specifically, no radiopaque renal stones. Normal noncontrast appearance of the left kidney. No evidence of left-sided nephrolithiasis or urinary obstruction. Normal noncontrast appearance of the bilateral adrenal glands. Normal noncontrast appearance of the urinary bladder given underdistention. Stomach/Bowel: Ingested enteric contrast extends to the level of the proximal descending colon. Moderate colonic stool burden without evidence of enteric obstruction. Normal noncontrast appearance of the terminal ileum. The appendix is not visualized, however there is no pericecal inflammatory change on this noncontrast examination. No significant hiatal hernia. No pneumoperitoneum, pneumatosis or portal  venous gas. Vascular/Lymphatic: Scattered atherosclerotic plaque within a normal caliber abdominal aorta. Redemonstrated scattered retroperitoneal adenopathy with index left-sided periaortic lymph node measuring 1.3 cm in greatest short axis diameter (image 44, series 2). Reproductive: Post hysterectomy. No discrete adnexal lesions. Interval development of small amount of free fluid in the pelvic cul-de-sac. Other: There is a minimal amount of subcutaneous edema about the midline of the low back. Musculoskeletal: Redemonstrated rather extensive osseous metastatic disease with multiple predominantly sclerotic lesions seen throughout the thoracic and lumbar spine, the pelvis, the inferior tip of the right scapula (image 12, series 2) and several right-sided ribs. No definitive evidence of impending pathologic fracture. Regional soft tissues appear normal. IMPRESSION: 1. Stable sequela of internal/external left-sided biliary drainage catheter placement. The drainage catheter is appropriately positioned with radiopaque marker at the level of the biliary hilum and distal coil located within the duodenum. Evaluation for residual intrahepatic biliary ductal  dilatation is suboptimally evaluated on this noncontrast examination though both the right and left biliary systems were noted to communicate at the time of biliary drainage catheter placement on 02/03/2021. 2. Worsening peripancreatic stranding/fluid worrisome for progressive pancreatitis, incompletely evaluated on this noncontrast examination. Note, questioned pancreatic head mass on recent MRCP is suboptimally evaluated on this noncontrast abdominal CT. 3. Redemonstrated right-sided pelvicaliectasis and ureterectasis to the level of the mid aspect of the right ureter, the etiology of which is not depicted on this examination. Specifically, no radiopaque renal stones. 4. Redemonstrated extensive osseous metastatic disease and mild retroperitoneal lymphadenopathy. 5.  Aortic Atherosclerosis (ICD10-I70.0). Electronically Signed   By: Sandi Mariscal M.D.   On: 02/06/2021 15:27   CT Abdomen Pelvis W Contrast  Result Date: 02/01/2021 CLINICAL DATA:  Itching in jaundice for 7 days. Shortness of breath. Dark colored urine. Nausea. Radiation therapy yesterday. History of breast cancer with osseous metastatic disease. EXAM: CT ABDOMEN AND PELVIS WITH CONTRAST TECHNIQUE: Multidetector CT imaging of the abdomen and pelvis was performed using the standard protocol following bolus administration of intravenous contrast. CONTRAST:  87mL OMNIPAQUE IOHEXOL 350 MG/ML SOLN COMPARISON:  12/29/2020 FINDINGS: Lower chest: Right mastectomy. Stable scarring in the right lower lobe. Hepatobiliary: New substantial intrahepatic biliary dilatation observed along with gallbladder wall thickening and high density material in the gallbladder which could be from sludge, proteinaceous fluid, or blood products. There is narrowing and substantial wall enhancement in the right and left hepatic ducts as they converge, raising the possibility of cholangitis. Ill definition of the common bile duct probably from wall thickening. No well-defined  stones. Pancreas: Stranding around the pancreatic head along with new mildly dilated dorsal pancreatic duct with truncation of caliber in the pancreatic head region, appearance raises some suspicion for focal pancreatitis. I do not see a specific well-defined mass in the pancreatic head. Spleen: Unremarkable Adrenals/Urinary Tract: New abnormal hydronephrosis and proximal hydroureter extending down to a 8.5 cm dilated segment of the right distal ureter which contains high internal density; this is new compared to the 12/29/2020 exam and accordingly raises suspicion for a blood clot rather than tumor. Mildly delayed nephrogram on the right. No well-defined stone. Adrenal glands unremarkable. Urinary bladder unremarkable. Stomach/Bowel: No dilated bowel observed.  Normal appendix. Vascular/Lymphatic: Left periaortic lymph node 1.3 cm in short axis on image 42 series 2, previously 1.9 cm. Other retroperitoneal lymph nodes are likewise improved compared to previous. Reproductive: Uterus absent.  Adnexa unremarkable. Other: Stable mild retroperitoneal stranding. Musculoskeletal: Stable widespread scattered sclerotic lesions indicative of prior osseous metastatic disease. Small umbilical hernia  contains adipose tissue. IMPRESSION: 1. Since 12/29/2020, there is new substantial intrahepatic biliary dilatation along with dorsal pancreatic duct dilatation. There seems to be abnormal enhancement and wall thickening in the common hepatic duct and common bile duct, along with gallbladder wall thickening and gallbladder sludge. In addition there is some stranding around the pancreatic head with truncation of the dilated dorsal pancreatic duct. Possibilities may include cholangitis and/or focal pancreatitis involving the pancreatic head. Acute cholecystitis not excluded. A discrete pancreatic head mass is not well seen. 2. In addition, there is new substantial right hydronephrosis and delayed nephrogram on the right extending down  to a tapering plug of high density material in the proximal ureter, presumably representing a blood clot in the ureter given that the kidney and ureter had a normal appearance 1 month ago. Tumor in the ureter seems unlikely in this time frame. Severe ureteritis might be a differential diagnostic consideration. 3. Mildly reduced retroperitoneal adenopathy, although the retroperitoneal stranding continues. 4. Stable widespread scattered sclerotic lesions indicative of prior metastatic disease. Electronically Signed   By: Van Clines M.D.   On: 02/01/2021 16:31   US RENAL  Result Date: 02/04/2021 CLINICAL DATA:  55 year old female for follow-up of hydronephrosis. EXAM: RENAL / URINARY TRACT ULTRASOUND COMPLETE COMPARISON:  02/02/2021 FINDINGS: Right Kidney: Renal measurements: 10.8 x 7.1 x 7.6 cm = volume: 303 mL. RIGHT hydronephrosis appears slightly improved since 02/02/2021, now moderate to severe. No renal mass is identified. Renal echogenicity is UPPER limits of normal. Left Kidney: Renal measurements: 10.9 x 6.5 x 6.9 cm = volume: 253 mL. Mild LEFT hydronephrosis is not significantly changed. No renal mass identified. Renal echogenicity is UPPER limits of normal. Bladder: RIGHT renal jet is again not visualized. A normal LEFT ureteral jet is present. No other abnormalities noted. Other: None. IMPRESSION: 1. Apparent slight improvement in RIGHT hydronephrosis, now moderate to severe. RIGHT ureteral jet again not visualized compatible with high-grade obstruction. 2. Unchanged mild LEFT hydronephrosis. 3. No other significant change. Electronically Signed   By: Margarette Canada M.D.   On: 02/04/2021 10:35   US RENAL  Result Date: 02/02/2021 CLINICAL DATA:  Hydronephrosis EXAM: RENAL / URINARY TRACT ULTRASOUND COMPLETE COMPARISON:  02/01/2021 FINDINGS: Right Kidney: Renal measurements: 6.2 x 11.2 x 5.9 cm = volume: 217 mL. Normal right renal cortical echotexture. There is severe right-sided hydronephrosis  unchanged since recent CT and MRI. No evidence of nephrolithiasis. Left Kidney: Renal measurements: 5.7 x 11.3 x 4.7 cm = volume: 159 mL. Normal left renal cortical echotexture. There is mild left-sided hydronephrosis, increased since recent ultrasound. No nephrolithiasis. Bladder: A left ureteral jet is identified. The right ureteral jet is not seen. No filling defects or bladder wall thickening. Other: None. IMPRESSION: 1. Severe right-sided hydronephrosis, not appreciably changed since recent CT. There is no right ureteral jet identified within the bladder, compatible with high-grade obstruction. 2. Mild left-sided hydronephrosis, increased since prior study. 3. No evidence of nephrolithiasis. Electronically Signed   By: Randa Ngo M.D.   On: 02/02/2021 20:41   MR ABDOMEN MRCP WO CONTRAST  Result Date: 02/01/2021 CLINICAL DATA:  Jaundice. Biliary ductal dilatation and right hydronephrosis on recent CT. Metastatic breast carcinoma. EXAM: MRI ABDOMEN WITHOUT CONTRAST  (INCLUDING MRCP) TECHNIQUE: Multiplanar multisequence MR imaging of the abdomen was performed. Heavily T2-weighted images of the biliary and pancreatic ducts were obtained, and three-dimensional MRCP images were rendered by post processing. COMPARISON:  CT on 02/01/2021 FINDINGS: Lower chest: No acute findings. Hepatobiliary: No hepatic masses visualized  on this unenhanced exam. Diffuse dilatation of the intrahepatic bile ducts is seen due to stricture of the common hepatic duct in the liver hilum. No evidence of cholecystitis. Pancreas: Pancreatic ductal dilatation is seen with obstruction in the pancreatic head. A solid T2 hypointense mass is seen in the pancreatic head measuring 3.6 x 3.6 cm on image 22/4, which is new since earlier CT on 12/29/2020. Adjacent soft tissue edema is seen within the porta hepatis and throughout the peripancreatic region. This rapid appearance in the past month is suggestive of focal pancreatitis, with  metastatic disease or primary pancreatic carcinoma considered less likely. Spleen:  Within normal limits in size. Adrenals/Urinary tract: No evidence of renal mass or abscess. Moderate right hydronephrosis is also new since 12/29/2020, with soft tissue thickening seen involving the proximal right ureter, with adjacent retroperitoneal edema. These findings may be secondary to pancreatitis, with metastatic disease considered less likely. Stomach/Bowel: No evidence of obstruction or wall thickening. Vascular/Lymphatic: Mild retroperitoneal lymphadenopathy is seen with largest lymph node measuring 1.5 cm on image 27/4. This shows mild decrease from 2.4 cm on earlier CT of 12/29/2020. Other:  None. 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 IMPRESSION: 3.6 cm masslike area in the pancreatic head with adjacent peripancreatic edema, which also causes diffuse biliary and pancreatic ductal dilatation. Rapid appearance since prior CT approximately 1 month ago is suggestive of focal pancreatitis, although metastatic disease cannot be excluded. Moderate right hydronephrosis with soft tissue prominence involving the proximal right ureter. These findings may also be secondary to acute pancreatitis, although metastatic disease cannot be excluded. Mild decrease in retroperitoneal lymphadenopathy since earlier CT on 10/18/202222. Diffuse bone metastases. Electronically Signed   By: Marlaine Hind M.D.   On: 02/01/2021 21:54   MR 3D Recon At Scanner  Result Date: 02/01/2021 CLINICAL DATA:  Jaundice. Biliary ductal dilatation and right hydronephrosis on recent CT. Metastatic breast carcinoma. EXAM: MRI ABDOMEN WITHOUT CONTRAST  (INCLUDING MRCP) TECHNIQUE: Multiplanar multisequence MR imaging of the abdomen was performed. Heavily T2-weighted images of the biliary and pancreatic ducts were obtained, and three-dimensional MRCP images were rendered by post processing. COMPARISON:  CT on  02/01/2021 FINDINGS: Lower chest: No acute findings. Hepatobiliary: No hepatic masses visualized on this unenhanced exam. Diffuse dilatation of the intrahepatic bile ducts is seen due to stricture of the common hepatic duct in the liver hilum. No evidence of cholecystitis. Pancreas: Pancreatic ductal dilatation is seen with obstruction in the pancreatic head. A solid T2 hypointense mass is seen in the pancreatic head measuring 3.6 x 3.6 cm on image 22/4, which is new since earlier CT on 12/29/2020. Adjacent soft tissue edema is seen within the porta hepatis and throughout the peripancreatic region. This rapid appearance in the past month is suggestive of focal pancreatitis, with metastatic disease or primary pancreatic carcinoma considered less likely. Spleen:  Within normal limits in size. Adrenals/Urinary tract: No evidence of renal mass or abscess. Moderate right hydronephrosis is also new since 12/29/2020, with soft tissue thickening seen involving the proximal right ureter, with adjacent retroperitoneal edema. These findings may be secondary to pancreatitis, with metastatic disease considered less likely. Stomach/Bowel: No evidence of obstruction or wall thickening. Vascular/Lymphatic: Mild retroperitoneal lymphadenopathy is seen with largest lymph node measuring 1.5 cm on image 27/4. This shows mild decrease from 2.4 cm on earlier CT of 12/29/2020. Other:  None. 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 IMPRESSION: 3.6 cm masslike area in the pancreatic head with adjacent  peripancreatic edema, which also causes diffuse biliary and pancreatic ductal dilatation. Rapid appearance since prior CT approximately 1 month ago is suggestive of focal pancreatitis, although metastatic disease cannot be excluded. Moderate right hydronephrosis with soft tissue prominence involving the proximal right ureter. These findings may also be secondary to acute pancreatitis, although  metastatic disease cannot be excluded. Mild decrease in retroperitoneal lymphadenopathy since earlier CT on 10/18/202222. Diffuse bone metastases. Electronically Signed   By: Marlaine Hind M.D.   On: 02/01/2021 21:54   DG ERCP BILIARY & PANCREATIC DUCTS  Result Date: 02/02/2021 CLINICAL DATA:  ERCP EXAM: ERCP TECHNIQUE: Multiple spot images obtained with the fluoroscopic device and submitted for interpretation post-procedure. FLUOROSCOPY TIME:  Refer to procedure report. COMPARISON:  None. FINDINGS: A total of 2 fluoroscopic spot images are submitted for review taken during ERCP procedure. Images demonstrate a scope overlying the upper abdomen. There then appears to be wire catheterization or attempted catheterization through the ampulla. No further images submitted. IMPRESSION: Fluoroscopic spot images during ERCP as described. Refer to procedure report for full details. These images were submitted for radiologic interpretation only. Please see the procedural report for the amount of contrast and the fluoroscopy time utilized. Electronically Signed   By: Albin Felling M.D.   On: 02/02/2021 16:50   DG C-Arm 1-60 Min-No Report  Result Date: 02/02/2021 Fluoroscopy was utilized by the requesting physician.  No radiographic interpretation.   IR BILIARY DRAIN PLACEMENT WITH CHOLANGIOGRAM  Result Date: 02/03/2021 INDICATION: 55 year old woman with metastatic breast cancer and common bile duct obstruction presents to interventional radiology for biliary drain placement. EXAM: Ultrasound and fluoroscopy guided internal external biliary drain placement MEDICATIONS: Cefoxitin 2 g IV; The antibiotic was administered within an appropriate time frame prior to the initiation of the procedure. ANESTHESIA/SEDATION: Moderate (conscious) sedation was employed during this procedure. A total of Versed 3 mg and Fentanyl 100 mcg was administered intravenously by the radiology nurse. Total intra-service moderate Sedation  Time: 17 minutes. The patient's level of consciousness and vital signs were monitored continuously by radiology nursing throughout the procedure under my direct supervision. FLUOROSCOPY TIME:  Fluoroscopy Time: 2 minutes 54 seconds (41 mGy). COMPLICATIONS: None immediate. PROCEDURE: Informed written consent was obtained from the patient after a thorough discussion of the procedural risks, benefits and alternatives. All questions were addressed. Maximal Sterile Barrier Technique was utilized including caps, mask, sterile gowns, sterile gloves, sterile drape, hand hygiene and skin antiseptic. A timeout was performed prior to the initiation of the procedure. Patient positioned supine on the procedure table. Epigastric skin prepped and draped in usual fashion. Ultrasound evaluation demonstrated moderate dilated left bile ducts. Using continuous ultrasound guidance, left hepatic lobe bile duct was accessed with a 21 gauge needle. Contrast administered through the needle under fluoroscopy opacified the biliary tree. 0.018 inch guidewire was advanced through the 21 gauge needle. 21 gauge needle exchanged for transitional dilator set. Transitional dilator set exchanged for Kumpe the catheter over 0.035 inch guidewire. Kumpe catheter advanced to the duodenum. 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 Kumpe catheter exchanged for 10 French internal external biliary drain. Contrast administered through the drain under fluoroscopy confirmed appropriate positioning. Drain was secured to skin with suture and connected to bag. IMPRESSION: 1. Ultrasound fluoroscopy guided 10 French internal external biliary drain placement as above. 2. 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 Electronically Signed   By: Miachel Roux M.D.   On: 02/03/2021 15:37   IR EXCHANGE BILIARY DRAIN  Result Date: 02/11/2021 INDICATION: 55 year old female with history of metastatic breast cancer and biliary obstruction status post left  percutaneous transhepatic, internal external biliary drain placement on 02/03/2021. EXAM: 1. Cholangiogram. 2. Biliary drain exchange. 3. Biliary brush biopsy. MEDICATIONS: Rocephin 2 gm IV; The antibiotic was administered within an appropriate time frame prior to the initiation of the procedure. ANESTHESIA/SEDATION: Moderate (conscious) sedation was employed during this procedure. A total of Versed 2 mg and Fentanyl 100 mcg was administered intravenously. Moderate Sedation Time: 19 minutes. The patient's level of consciousness and vital signs were monitored continuously by radiology nursing throughout the procedure under my direct supervision. FLUOROSCOPY TIME:  Fluoroscopy Time: 3 minutes 24 seconds (34 mGy). COMPLICATIONS: None immediate. PROCEDURE: Informed written consent was obtained from the patient after a thorough discussion of the procedural risks, benefits and alternatives. All questions were addressed. Maximal Sterile Barrier Technique was utilized including caps, mask, sterile gowns, sterile gloves, sterile drape, hand hygiene and skin antiseptic. A timeout was performed prior to the initiation of the procedure. The upper abdomen and indwelling left-sided biliary drain were prepped and draped in standard fashion. Preprocedure scout radiograph demonstrated unchanged position of the indwelling biliary drain. Gentle hand injection demonstrated no significant intrahepatic biliary ductal dilation. Subdermal Local anesthesia was administered around the indwelling biliary drain. External suture was cut. The external portion of the tube was cut to release the pigtail and an Amplatz wire was inserted with its tip in the distal duodenum. The tube was removed over the wire. An 8 [place], 25 cm Brite tip sheath was then inserted with the distal tip in the duodenum. The Amplatz wire was exchanged for a 018 glidewire. Next, 3 passes were made near the biliary confluence and superior aspect of the common bile duct with  separate brush biopsy sets. The brushes were cut in placed in saline and sent to pathology. The sheath was then readvanced into the small bowel and the Glidewire was exchanged for an Amplatz wire. The sheath was then removed and a new, 10.2 [place] biliary drain was placed with the pigtail portion coiled in the duodenum and the radiopaque marker in a peripheral left-sided bile duct. Gentle hand injection confirmed location and patency with brisk antegrade flow into the duodenum. The drain was locked and reinforced at the skin entry site with a silk suture. A cap was placed on the drain sterile bandage was applied. The patient tolerated procedure well was transferred back to the floor in good condition. IMPRESSION: 1. Technically successful brush biopsy of the hilar confluence and superior aspect of the common bile duct. 2. Technically successful biliary drain exchange for a new 10.2 [place] biliary drain. The drain was capped. PLAN: Keep the drain capped and internalized unless the patient develops symptoms of recurrent biliary obstruction, in which case the drain should be placed back to bag drainage. Plan follow-up in Interventional Radiology in 8 weeks for biliary drain check and change, or sooner if desired for internal biliary stent placement and removal of percutaneous drain. Ruthann Cancer, MD [facility]iology Electronically Signed   By: Ruthann Cancer M.D.   On: 02/11/2021 13:33   IR ENDOLUMINAL BX OF BILIARY TREE  Result Date: 02/11/2021 INDICATION: 55 year old female with history of metastatic breast cancer and biliary obstruction status post left percutaneous transhepatic, internal external biliary drain placement on 02/03/2021. EXAM: 1. Cholangiogram. 2. Biliary drain exchange. 3. Biliary brush biopsy. MEDICATIONS: Rocephin 2 gm IV; The antibiotic was administered within an appropriate time  frame prior to the initiation of the procedure.  ANESTHESIA/SEDATION: Moderate (conscious) sedation was employed during this procedure. A total of Versed 2 mg and Fentanyl 100 mcg was administered intravenously. Moderate Sedation Time: 19 minutes. The patient's level of consciousness and vital signs were monitored continuously by radiology nursing throughout the procedure under my direct supervision. FLUOROSCOPY TIME:  Fluoroscopy Time: 3 minutes 24 seconds (34 mGy). COMPLICATIONS: None immediate. PROCEDURE: Informed written consent was obtained from the patient after a thorough discussion of the procedural risks, benefits and alternatives. All questions were addressed. Maximal Sterile Barrier Technique was utilized including caps, mask, sterile gowns, sterile gloves, sterile drape, hand hygiene and skin antiseptic. A timeout was performed prior to the initiation of the procedure. The upper abdomen and indwelling left-sided biliary drain were prepped and draped in standard fashion. Preprocedure scout radiograph demonstrated unchanged position of the indwelling biliary drain. Gentle hand injection demonstrated no significant intrahepatic biliary ductal dilation. Subdermal Local anesthesia was administered around the indwelling biliary drain. External suture was cut. The external portion of the tube was cut to release the pigtail and an Amplatz wire was inserted with its tip in the distal duodenum. The tube was removed over the wire. An 8 [place], 25 cm Brite tip sheath was then inserted with the distal tip in the duodenum. The Amplatz wire was exchanged for a 018 glidewire. Next, 3 passes were made near the biliary confluence and superior aspect of the common bile duct with separate brush biopsy sets. The brushes were cut in placed in saline and sent to pathology. The sheath was then readvanced into the small bowel and the Glidewire was exchanged for an Amplatz wire. The sheath was then removed and a new, 10.2 [place] biliary drain was placed with the pigtail portion  coiled in the duodenum and the radiopaque marker in a peripheral left-sided bile duct. Gentle hand injection confirmed location and patency with brisk antegrade flow into the duodenum. The drain was locked and reinforced at the skin entry site with a silk suture. A cap was placed on the drain sterile bandage was applied. The patient tolerated procedure well was transferred back to the floor in good condition. IMPRESSION: 1. Technically successful brush biopsy of the hilar confluence and superior aspect of the common bile duct. 2. Technically successful biliary drain exchange for a new 10.2 [place] biliary drain. The drain was capped. PLAN: Keep the drain capped and internalized unless the patient develops symptoms of recurrent biliary obstruction, in which case the drain should be placed back to bag drainage. Plan follow-up in Interventional Radiology in 8 weeks for biliary drain check and change, or sooner if desired for internal biliary stent placement and removal of percutaneous drain. Ruthann Cancer, MD [facility] Radiology Electronically Signed   By: Ruthann Cancer M.D.   On: 02/11/2021 13:33   US Abdomen Limited RUQ (LIVER/GB)  Result Date: 01/28/2021 CLINICAL DATA:  Elevated bilirubin. EXAM: ULTRASOUND ABDOMEN LIMITED RIGHT UPPER QUADRANT COMPARISON:  CT temporal bones, 12/29/2020. FINDINGS: Gallbladder: Nondistended, sludge-filled gallbladder with gallbladder wall measuring near the upper limit of normal in thickness, at 0.4 cm. No sonographic Murphy sign noted by sonographer. Common bile duct: Diameter: 0.6 cm Liver: No focal lesion identified. Within normal limits in parenchymal echogenicity. Portal vein is patent on color Doppler imaging with normal direction of blood flow towards the liver. Other: No ascites. IMPRESSION: Biliary sludge without additional sonographic evidence of acute cholecystitis. If concern for biliary dyskinesia, recommend NM HIDA  for  further evaluation. Electronically Signed   By: Michaelle Birks M.D.   On: 01/28/2021 08:12    Microbiology: No results found for this or any previous visit (from the past 240 hour(s)).   Labs: Basic Metabolic Panel: Recent Labs  Lab 02/07/21 0521 02/08/21 0600 02/09/21 0532 02/10/21 0505 02/11/21 0604 02/12/21 0335 02/13/21 0605  NA 130* 134* 128* 128* 129* 131* 130*  K 4.0 3.1* 4.2 3.9 4.3 4.0 3.9  CL 93* 105 94* 98 99 101 104  CO2 28 21* 26 20* 24 24 23   GLUCOSE 118* 106* 123* 127* 114* 118* 119*  BUN 26* 20 22* 26* 21* 13 10  CREATININE 1.25* 0.93 1.33* 1.35* 1.29* 1.07* 1.04*  CALCIUM 9.9 7.7* 10.2 9.2 9.5 9.1 8.8*  MG 2.2 1.9 2.4 2.4  --   --   --   PHOS 3.8  --  3.1 3.1  --   --   --    Liver Function Tests: Recent Labs  Lab 02/09/21 0532 02/10/21 0505 02/11/21 0604 02/12/21 0335 02/13/21 0605  AST 39 37 35 37 40  ALT 40 40 40 37 42  ALKPHOS 229* 192* 216* 184* 175*  BILITOT 6.7* 5.1* 4.0* 3.1* 2.8*  PROT 7.9 7.0 7.4 6.9 6.4*  ALBUMIN 2.5* 2.4* 2.4* 2.3* 2.4*   No results for input(s): LIPASE, AMYLASE in the last 168 hours. No results for input(s): AMMONIA in the last 168 hours. CBC: Recent Labs  Lab 02/09/21 0532 02/10/21 0505 02/11/21 0604 02/12/21 0335 02/13/21 0605  WBC 13.5* 14.8* 16.0* 12.9* 11.5*  NEUTROABS  --   --   --  10.2*  --   HGB 10.9* 10.5* 10.5* 9.2* 9.2*  HCT 33.0* 32.5* 31.3* 28.7* 28.3*  MCV 100.6* 100.9* 101.3* 102.5* 102.2*  PLT 413* 481* 440* 395 403*   Cardiac Enzymes: No results for input(s): CKTOTAL, CKMB, CKMBINDEX, TROPONINI in the last 168 hours. BNP: BNP (last 3 results) No results for input(s): BNP in the last 8760 hours.  ProBNP (last 3 results) No results for input(s): PROBNP in the last 8760 hours.  CBG: No results for input(s): GLUCAP in the last 168 hours.     Signed:  Kayleen Memos, MD [facility] 02/13/2021, 5:31 PM

## 2021-02-15 ENCOUNTER — Other Ambulatory Visit (HOSPITAL_COMMUNITY): Payer: Self-pay

## 2021-02-15 ENCOUNTER — Ambulatory Visit
Admission: RE | Admit: 2021-02-15 | Discharge: 2021-02-15 | Disposition: A | Discharge status: Home / Self Care | Payer: BC Managed Care – PPO | Source: Ambulatory Visit | Attending: Radiation Oncology | Admitting: Radiation Oncology

## 2021-02-15 ENCOUNTER — Other Ambulatory Visit: Payer: Self-pay

## 2021-02-15 DIAGNOSIS — Z510 Encounter for antineoplastic radiation therapy: Secondary | ICD-10-CM | POA: Diagnosis present

## 2021-02-15 DIAGNOSIS — Z171 Estrogen receptor negative status [ER-]: Secondary | ICD-10-CM | POA: Diagnosis not present

## 2021-02-15 DIAGNOSIS — Z9011 Acquired absence of right breast and nipple: Secondary | ICD-10-CM | POA: Diagnosis not present

## 2021-02-15 DIAGNOSIS — C50411 Malignant neoplasm of upper-outer quadrant of right female breast: Secondary | ICD-10-CM | POA: Diagnosis present

## 2021-02-15 DIAGNOSIS — C7951 Secondary malignant neoplasm of bone: Secondary | ICD-10-CM | POA: Diagnosis present

## 2021-02-15 DIAGNOSIS — Z923 Personal history of irradiation: Secondary | ICD-10-CM | POA: Diagnosis not present

## 2021-02-15 DIAGNOSIS — Z79811 Long term (current) use of aromatase inhibitors: Secondary | ICD-10-CM | POA: Diagnosis not present

## 2021-02-15 DIAGNOSIS — Z79899 Other long term (current) drug therapy: Secondary | ICD-10-CM | POA: Diagnosis not present

## 2021-02-15 LAB — CYTOLOGY - NON PAP

## 2021-02-16 ENCOUNTER — Ambulatory Visit
Admission: RE | Admit: 2021-02-16 | Discharge: 2021-02-16 | Disposition: A | Discharge status: Home / Self Care | Payer: BC Managed Care – PPO | Source: Ambulatory Visit | Attending: Radiation Oncology | Admitting: Radiation Oncology

## 2021-02-16 DIAGNOSIS — C50411 Malignant neoplasm of upper-outer quadrant of right female breast: Secondary | ICD-10-CM | POA: Diagnosis not present

## 2021-02-17 ENCOUNTER — Other Ambulatory Visit: Payer: Self-pay

## 2021-02-17 ENCOUNTER — Ambulatory Visit
Admission: RE | Admit: 2021-02-17 | Discharge: 2021-02-17 | Disposition: A | Discharge status: Home / Self Care | Payer: BC Managed Care – PPO | Source: Ambulatory Visit | Attending: Radiation Oncology | Admitting: Radiation Oncology

## 2021-02-17 DIAGNOSIS — C50411 Malignant neoplasm of upper-outer quadrant of right female breast: Secondary | ICD-10-CM | POA: Diagnosis not present

## 2021-02-18 ENCOUNTER — Ambulatory Visit
Admission: RE | Admit: 2021-02-18 | Discharge: 2021-02-18 | Disposition: A | Discharge status: Home / Self Care | Payer: BC Managed Care – PPO | Source: Ambulatory Visit | Attending: Radiation Oncology | Admitting: Radiation Oncology

## 2021-02-18 DIAGNOSIS — C50411 Malignant neoplasm of upper-outer quadrant of right female breast: Secondary | ICD-10-CM | POA: Diagnosis not present

## 2021-02-19 ENCOUNTER — Ambulatory Visit: Payer: BC Managed Care – PPO | Admitting: Radiation Oncology

## 2021-02-19 ENCOUNTER — Ambulatory Visit
Admission: RE | Admit: 2021-02-19 | Discharge: 2021-02-19 | Disposition: A | Discharge status: Home / Self Care | Payer: BC Managed Care – PPO | Source: Ambulatory Visit | Attending: Radiation Oncology | Admitting: Radiation Oncology

## 2021-02-19 ENCOUNTER — Other Ambulatory Visit: Payer: Self-pay

## 2021-02-19 DIAGNOSIS — C50411 Malignant neoplasm of upper-outer quadrant of right female breast: Secondary | ICD-10-CM | POA: Diagnosis not present

## 2021-02-22 ENCOUNTER — Other Ambulatory Visit: Payer: Self-pay

## 2021-02-22 ENCOUNTER — Ambulatory Visit: Payer: BC Managed Care – PPO

## 2021-02-22 ENCOUNTER — Other Ambulatory Visit (HOSPITAL_COMMUNITY): Payer: Self-pay

## 2021-02-22 ENCOUNTER — Ambulatory Visit
Admission: RE | Admit: 2021-02-22 | Discharge: 2021-02-22 | Disposition: A | Discharge status: Home / Self Care | Payer: BC Managed Care – PPO | Source: Ambulatory Visit | Attending: Radiation Oncology | Admitting: Radiation Oncology

## 2021-02-22 DIAGNOSIS — C50411 Malignant neoplasm of upper-outer quadrant of right female breast: Secondary | ICD-10-CM | POA: Diagnosis not present

## 2021-02-23 ENCOUNTER — Encounter: Payer: Self-pay | Admitting: Oncology

## 2021-02-23 ENCOUNTER — Other Ambulatory Visit (HOSPITAL_COMMUNITY): Payer: Self-pay

## 2021-02-23 ENCOUNTER — Ambulatory Visit
Admission: RE | Admit: 2021-02-23 | Discharge: 2021-02-23 | Disposition: A | Discharge status: Home / Self Care | Payer: BC Managed Care – PPO | Source: Ambulatory Visit | Attending: Radiation Oncology | Admitting: Radiation Oncology

## 2021-02-23 ENCOUNTER — Telehealth: Payer: Self-pay

## 2021-02-23 ENCOUNTER — Ambulatory Visit: Payer: BC Managed Care – PPO | Attending: General Surgery

## 2021-02-23 ENCOUNTER — Other Ambulatory Visit: Payer: Self-pay | Admitting: Radiology

## 2021-02-23 DIAGNOSIS — M25611 Stiffness of right shoulder, not elsewhere classified: Secondary | ICD-10-CM

## 2021-02-23 DIAGNOSIS — Z170 Estrogen receptor positive status [ER+]: Secondary | ICD-10-CM | POA: Insufficient documentation

## 2021-02-23 DIAGNOSIS — C50411 Malignant neoplasm of upper-outer quadrant of right female breast: Secondary | ICD-10-CM

## 2021-02-23 DIAGNOSIS — R293 Abnormal posture: Secondary | ICD-10-CM

## 2021-02-23 DIAGNOSIS — R600 Localized edema: Secondary | ICD-10-CM

## 2021-02-23 DIAGNOSIS — Z483 Aftercare following surgery for neoplasm: Secondary | ICD-10-CM

## 2021-02-23 MED ORDER — NORMAL SALINE FLUSH 0.9 % IV SOLN
10.0000 mL | Freq: Every day | INTRAVENOUS | 0 refills | Status: AC
  Filled 2021-02-23: qty 300, 30d supply, fill #0

## 2021-02-23 NOTE — Therapy (Signed)
[facility] @ [facility] [place], [place], [address] Phone: [phone]   Fax:  [phone]  Physical Therapy Treatment  Patient Details  Name: [name] MRN: [id number] Date of Birth: June 10, 1965 Referring Provider (PT): Dr. Rolm Bookbinder   Encounter Date: 02/23/2021   PT End of Session - 02/23/21 0851     Visit Number 10    Number of Visits 26    Date for PT Re-Evaluation 04/20/21    PT Start Time 0802    PT Stop Time 0852    PT Time Calculation (min) 50 min    Activity Tolerance Patient tolerated treatment well    Behavior During Therapy [facility] for tasks assessed/performed             Past Medical History:  Diagnosis Date   Allergy    Arthritis 2018   hands, left knee   Breast cancer ([place]) 03-27-2020   right breast [facility]   Eczema    Family history of breast cancer    Family history of lung cancer    Family history of prostate cancer    Hypertension     Past Surgical History:  Procedure Laterality Date   ABDOMINAL HYSTERECTOMY     CESAREAN SECTION     ERCP N/A 02/02/2021   Procedure: ENDOSCOPIC RETROGRADE CHOLANGIOPANCREATOGRAPHY (ERCP);  Surgeon: Carol Ada, MD;  Location: [facility];  Service: Endoscopy;  Laterality: N/A;   ESOPHAGOGASTRODUODENOSCOPY N/A 02/09/2021   Procedure: ESOPHAGOGASTRODUODENOSCOPY (EGD);  Surgeon: Carol Ada, MD;  Location: [facility];  Service: Endoscopy;  Laterality: N/A;   IR BILIARY DRAIN PLACEMENT WITH CHOLANGIOGRAM  02/03/2021   IR ENDOLUMINAL BX OF BILIARY TREE  02/10/2021   IR EXCHANGE BILIARY DRAIN  02/10/2021   KNEE ARTHROSCOPY W/ ACL RECONSTRUCTION Left    KNEE SURGERY     MODIFIED MASTECTOMY Right 12/07/2020   Procedure: RIGHT MODIFIED RADICAL MASTECTOMY;  Surgeon: Rolm Bookbinder, MD;  Location: [facility];  Service: General;  Laterality: Right;   PORTACATH PLACEMENT Left 04/23/2020   Procedure: LEFT SIDE PORT PLACEMENT WITH  ULTRASOUND GUIDANCE;  Surgeon: Rolm Bookbinder, MD;  Location: [facility];  Service: General;  Laterality: Left;  230PM START TIME PLEASE ROOM 2 THANKS   SPHINCTEROTOMY  02/02/2021   Procedure: SPHINCTEROTOMY;  Surgeon: Carol Ada, MD;  Location: [facility];  Service: Endoscopy;;   TUBAL LIGATION     UPPER ESOPHAGEAL ENDOSCOPIC ULTRASOUND (EUS) N/A 02/09/2021   Procedure: UPPER ESOPHAGEAL ENDOSCOPIC ULTRASOUND (EUS);  Surgeon: Carol Ada, MD;  Location: [facility];  Service: Endoscopy;  Laterality: N/A;    There were no vitals filed for this visit.   Subjective Assessment - 02/23/21 0800     Subjective I had a catheter put in my stomach to drain fluid from my liver I think.  They think I might have had an obstruction.  They really don't know. My labs are still out of wack.  I am still having alot of trouble with the cording, but ROM isn't too bad, but its still stiff.    Pertinent History Patient was diagnosed on 02/25/2020 with right grade II invasive ductal carcinoma breast cancer. It measures 1.8 cm and is located in the upper outer quadrant. It is functionally triple negative with ER/PR being only 1% and HER2 is negative.She underwent a right modified radical mastectomy (12 of 13 nodes positive) on 12/07/2020. Ki67 is 30%.    Patient Stated Goals Get my arm better  Currently in Pain? Yes    Pain Score 4     Pain Location Other (Comment)   stomach   Pain Orientation Mid    Pain Descriptors / Indicators Spasm    Pain Type Acute pain    Pain Onset 1 to 4 weeks ago    Pain Frequency Intermittent    Aggravating Factors  not sure    Pain Relieving Factors nothing.  It just comes and goes    Effect of Pain on Daily Activities limited by fatigue/pain    Multiple Pain Sites No                [facility] PT Assessment - 02/23/21 0001       Assessment   Medical Diagnosis s/p right mastectomy and ALND    Referring Provider (PT) Dr. Rolm Bookbinder    Onset  Date/Surgical Date 12/07/20    Hand Dominance Right      Prior Function   Level of Independence Independent      Observation/Other Assessments   Observations catheter in stomach that pt can use to drain fluids if she needs to    Skin Integrity hyperpigmentation from radiation with skin peeling      AROM   Right Shoulder Flexion 139 Degrees    Right Shoulder ABduction 146 Degrees    Right Shoulder External Rotation 90 Degrees               LYMPHEDEMA/ONCOLOGY QUESTIONNAIRE - 02/23/21 0001       Right Upper Extremity Lymphedema   10 cm Proximal to Olecranon Process 32 cm    Olecranon Process 227 cm    10 cm Proximal to Ulnar Styloid Process 23.7 cm    Just Proximal to Ulnar Styloid Process 17 cm    At Base of 2nd Digit 6.6 cm             L-DEX FLOWSHEETS - 02/23/21 0800       L-DEX LYMPHEDEMA SCREENING   Measurement Type Unilateral    L-DEX MEASUREMENT EXTREMITY Upper Extremity    POSITION  Standing    DOMINANT SIDE Right    At Risk Side Right    BASELINE SCORE (UNILATERAL) 3.5    L-DEX SCORE (UNILATERAL) -24.5    VALUE CHANGE (UNILAT) -28                       [facility] Adult PT Treatment/Exercise - 02/23/21 0001       Manual Therapy   Myofascial Release myofascial release to multiple cords in right axillary, upper arm and antecubital fossa    Passive ROM PROM Right shoulder flexion, abuction, scaption, ER with MFR                          PT Long Term Goals - 02/23/21 0954       PT LONG TERM GOAL #1   Title Patient will demonstrate she has regained full shoulder ROM and function post operatively compared to baselines.    Time 4    Period Weeks    Status On-going    Target Date 03/23/21      PT LONG TERM GOAL #2   Title Patient will increase right shoulder active flexion and abduction to >/=  140 degrees for increased ease reaching and to obtain radiation positioning.    Time 4    Period Weeks    Status Achieved  PT LONG TERM GOAL #3   Title Patient will improve her DASH score to be back to zero for improved overall arm function.    Time 8    Period Weeks    Status On-going      PT LONG TERM GOAL #4   Title Patient will report >/= 50% improvement in her swelling complaint to tolerate daily tasks with greater ease.    Time 4    Period Weeks    Status On-going      PT LONG TERM GOAL #5   Title Patient will verbalize good understanding of lymphedema risk reduction.    Time 8    Period Weeks    Status On-going    Target Date 04/20/21                   Plan - 02/23/21 5681     Clinical Impression Statement Pt returns and was reassessed.  She has had several short hospital stays and she now has a catheter in her stomach.  ROM has declined since last measured several weeks ago.  Her SOZO number was "off the charts at -28 however it could be due to other medical concerns.  Circumference measures were not increased today. Pts skin also with hyperpigmentation from radiation.  Cording is still very present in the axillary and upper arm region and continues to bother pt. . She will benefit from further therapy as she feels up to, to reduce cording and regain ROM and use of Right UE.   Stability/Clinical Decision Making Evolving/Moderate complexity    Rehab Potential Good    PT Frequency 2x / week    PT Duration 8 weeks    PT Treatment/Interventions ADLs/Self Care Home Management;Therapeutic exercise;Patient/family education;Manual techniques;Manual lymph drainage;Passive range of motion;Scar mobilization    PT Next Visit Plan check labs,cont MFR to cording, PROM, MLD to right axillary/trunk and teach pt, educate on lymphedema . Repeat SOZO in several weeks. (Sleeve is in but not picked up yet)   PT Home Exercise Plan progress HEP next, gentle shoulder stabilizations/strength as AROM is WNL.    Consulted and Agree with Plan of Care Patient             Patient will benefit from skilled  therapeutic intervention in order to improve the following deficits and impairments:  Postural dysfunction, Decreased range of motion, Decreased knowledge of precautions, Impaired UE functional use, Pain, Increased edema, Decreased scar mobility, Increased fascial restricitons  Visit Diagnosis: Malignant neoplasm of upper-outer quadrant of right breast in female, estrogen receptor positive ([place])  Abnormal posture  Aftercare following surgery for neoplasm  Stiffness of right shoulder, not elsewhere classified  Localized edema     Problem List Patient Active Problem List   Diagnosis Date Noted   Acute pancreatitis 02/02/2021   Jaundice 02/01/2021   Port-A-Cath in place 12/31/2020   S/P mastectomy, right 12/07/2020   Genetic testing 04/24/2020   Bone metastases ([place]) 04/21/2020   Family history of breast cancer    Family history of lung cancer    Family history of prostate cancer    Malignant neoplasm of upper-outer quadrant of right breast in female, estrogen receptor positive ([place]) 04/03/2020   Essential hypertension, benign 02/11/2013   Other malaise and fatigue 02/11/2013   Tobacco use disorder 02/11/2013   Eczema 02/11/2013   Bacterial vaginosis 02/11/2013    Claris Pong, PT 02/23/2021, 9:58 AM  [facility] @ [facility] [phone]  [place], [place], [address] Phone: [phone]   Fax:  [phone]  Name: [name] MRN: [id number] Date of Birth: 09-30-1965

## 2021-02-23 NOTE — Telephone Encounter (Signed)
Notified Patient of completion of Disability paperwork.Fax transmission confirmation received and request for medical records forwarded to [facility] [employer] with signed Release of Information Form. Copies placed for pick-up as requested.

## 2021-02-24 ENCOUNTER — Other Ambulatory Visit: Payer: Self-pay

## 2021-02-24 ENCOUNTER — Ambulatory Visit: Payer: BC Managed Care – PPO

## 2021-02-24 ENCOUNTER — Other Ambulatory Visit: Payer: Self-pay | Admitting: General Surgery

## 2021-02-24 ENCOUNTER — Ambulatory Visit
Admission: RE | Admit: 2021-02-24 | Discharge: 2021-02-24 | Disposition: A | Discharge status: Home / Self Care | Payer: BC Managed Care – PPO | Source: Ambulatory Visit | Attending: Radiation Oncology | Admitting: Radiation Oncology

## 2021-02-24 ENCOUNTER — Other Ambulatory Visit: Payer: Self-pay | Admitting: Oncology

## 2021-02-24 DIAGNOSIS — Z9011 Acquired absence of right breast and nipple: Secondary | ICD-10-CM | POA: Insufficient documentation

## 2021-02-24 DIAGNOSIS — C50411 Malignant neoplasm of upper-outer quadrant of right female breast: Secondary | ICD-10-CM | POA: Insufficient documentation

## 2021-02-24 DIAGNOSIS — Z171 Estrogen receptor negative status [ER-]: Secondary | ICD-10-CM | POA: Insufficient documentation

## 2021-02-24 DIAGNOSIS — C7951 Secondary malignant neoplasm of bone: Secondary | ICD-10-CM | POA: Insufficient documentation

## 2021-02-24 DIAGNOSIS — K831 Obstruction of bile duct: Secondary | ICD-10-CM

## 2021-02-24 DIAGNOSIS — Z79811 Long term (current) use of aromatase inhibitors: Secondary | ICD-10-CM | POA: Insufficient documentation

## 2021-02-24 DIAGNOSIS — Z79899 Other long term (current) drug therapy: Secondary | ICD-10-CM | POA: Insufficient documentation

## 2021-02-24 DIAGNOSIS — Z923 Personal history of irradiation: Secondary | ICD-10-CM | POA: Insufficient documentation

## 2021-02-24 DIAGNOSIS — Z510 Encounter for antineoplastic radiation therapy: Secondary | ICD-10-CM | POA: Insufficient documentation

## 2021-02-24 NOTE — Progress Notes (Signed)
[place]  [phone] [phone]    ID: ZAYNA TOSTE DOB: 1965/09/09  MR#: [id number]  SAY#:[id number]  Patient Care Team: [name], MD as PCP - General (Family Medicine) Mauro Kaufmann, RN as Oncology Nurse Navigator Rockwell Germany, RN as Oncology Nurse Navigator Rolm Bookbinder, MD as Consulting Physician (General Surgery) Kyung Rudd, MD as Consulting Physician (Radiation Oncology) Cheri Fowler, MD as Consulting Physician (Obstetrics and Gynecology) Elsie Saas, MD as Consulting Physician (Orthopedic Surgery) Carol Ada, MD as Consulting Physician (Gastroenterology) Festus Aloe, MD as Consulting Physician (Urology) Chauncey Cruel, MD OTHER MD:  CHIEF COMPLAINT: triple negative breast cancer (s/p right mastectomy)  CURRENT TREATMENT: Completing adjuvant radiation    INTERVAL HISTORY: Ailsa returns today for follow up and treatment of her 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  She is accompanied by her mother  While Gaylin was in the hospital, she underwent EUS on 02/09/2021. Brushings from the procedure were submitted for cytology, and the results ([id number]) were read as suspicious for malignancy.  She continues on postmastectomy radiation, scheduled to be completed on 03/09/21.   REVIEW OF SYSTEMS: Kandie tells me her urine is now clear.  She is having regular and normal bowel movements.  Her jaundice has resolved.  She has burning feeling over the lower pelvis, where the bladder is a, which is generally relieved with urination.  The active urination itself does not burn.  If the burning feeling in the bladder does not clear after urinating she takes a Tylenol and that usually takes care of the problem.  She is tolerating radiation generally well but she is very fatigued--she says chemotherapy was easier than this.  She has had no unusual headaches no visual changes no nausea or vomiting no cough phlegm  production or pleurisy.  She has pain in the right flank area at times.  She changes her own bandage of the anterior drain of her biliary tract.  She has had no evidence of infection or inflammation there.  A detailed review of systems was otherwise noncontributory   COVID 19 VACCINATION STATUS: fully vaccinated Therapist, music), with booster x1   HISTORY OF CURRENT ILLNESS: From the original intake note:  BRITANNI YARDE had routine screening mammography showing a possible abnormality in the right breast and a prominent intramammary lymph node. She underwent right diagnostic mammography with tomography and right breast ultrasonography at [facility] on 03/26/2020 showing: breast density category B; palpable 1.8 cm right breast mass at 10 o'clock, which feels much larger than what is visualized sonographically; suspicious 0.6 cm intramammary lymph node in right breast at 10 o'clock; at least 3 morphologically abnormal lymph nodes in right axilla.  Accordingly on 03/27/2020 she proceeded to biopsy of the right breast areas in question. The pathology from this procedure ([id number]) showed:  1. Right Breast, 10 o'clock (mass)  - invasive mammary carcinoma, e-cadherin positive with a lobular growth pattern, grade 2  - mammary carcinoma in situ  - Prognostic indicators significant for: estrogen receptor, 1% positive and progesterone receptor, 1% positive, both with strong staining intensity. Proliferation marker Ki67 at 30%. HER2 negative by immunohistochemistry (0). 2. Right Breast, 10 o'clock (intramammary node)  - mammary carcinoma involving lymph node and perinodal soft tissue  -  Prognostic indicators significant for: estrogen receptor, 1% positive with strong staining intensity and progesterone receptor, 0% negative. Proliferation marker Ki67 at 40%. HER2 negative by immunohistochemistry (0).   Cancer Staging  Malignant neoplasm of upper-outer quadrant  of right breast in female, estrogen receptor  positive ([place]) Staging form: Breast, AJCC 8th Edition - Pathologic stage from 03/31/2020: Stage IV (cM1) - Signed by Gardenia Phlegm, NP on 05/01/2020 - Clinical stage from 04/08/2020: Stage IIB (cT1c, cN1(f), cM0, G2, ER-, PR-, HER2-) - Signed by Chauncey Cruel, MD on 04/08/2020 Stage prefix: Initial diagnosis Method of lymph node assessment: Core biopsy  The patient's subsequent history is as detailed below.   PAST MEDICAL HISTORY: Past Medical History:  Diagnosis Date   Allergy    Arthritis 2018   hands, left knee   Breast cancer ([place]) 03-27-2020   right breast IMC   Eczema    Family history of breast cancer    Family history of lung cancer    Family history of prostate cancer    Hypertension     PAST SURGICAL HISTORY: Past Surgical History:  Procedure Laterality Date   ABDOMINAL HYSTERECTOMY     CESAREAN SECTION     ERCP N/A 02/02/2021   Procedure: ENDOSCOPIC RETROGRADE CHOLANGIOPANCREATOGRAPHY (ERCP);  Surgeon: Carol Ada, MD;  Location: [facility];  Service: Endoscopy;  Laterality: N/A;   ESOPHAGOGASTRODUODENOSCOPY N/A 02/09/2021   Procedure: ESOPHAGOGASTRODUODENOSCOPY (EGD);  Surgeon: Carol Ada, MD;  Location: [facility];  Service: Endoscopy;  Laterality: N/A;   IR BILIARY DRAIN PLACEMENT WITH CHOLANGIOGRAM  02/03/2021   IR ENDOLUMINAL BX OF BILIARY TREE  02/10/2021   IR EXCHANGE BILIARY DRAIN  02/10/2021   KNEE ARTHROSCOPY W/ ACL RECONSTRUCTION Left    KNEE SURGERY     MODIFIED MASTECTOMY Right 12/07/2020   Procedure: RIGHT MODIFIED RADICAL MASTECTOMY;  Surgeon: Rolm Bookbinder, MD;  Location: [facility];  Service: General;  Laterality: Right;   PORTACATH PLACEMENT Left 04/23/2020   Procedure: LEFT SIDE PORT PLACEMENT WITH ULTRASOUND GUIDANCE;  Surgeon: Rolm Bookbinder, MD;  Location: [facility];  Service: General;  Laterality: Left;  230PM START TIME PLEASE ROOM 2 THANKS   SPHINCTEROTOMY  02/02/2021    Procedure: SPHINCTEROTOMY;  Surgeon: Carol Ada, MD;  Location: [facility];  Service: Endoscopy;;   TUBAL LIGATION     UPPER ESOPHAGEAL ENDOSCOPIC ULTRASOUND (EUS) N/A 02/09/2021   Procedure: UPPER ESOPHAGEAL ENDOSCOPIC ULTRASOUND (EUS);  Surgeon: Carol Ada, MD;  Location: [facility];  Service: Endoscopy;  Laterality: N/A;    FAMILY HISTORY: Family History  Problem Relation Age of Onset   Breast cancer Mother 51   Arthritis Mother    Diabetes Father    Kidney disease Father    Hypertension Sister    Heart disease Maternal Grandmother    Breast cancer Paternal Grandmother        dx >50   Heart disease Maternal Aunt    Breast cancer Maternal Aunt 77   Other Maternal Aunt        brain tumor (not cancerous)   Lung cancer Maternal Aunt    Prostate cancer Cousin 45       localized   Her parents are both living, her father age 97 and mother age 65, as of 03/2020. Jolanta has 1 brother and 2 sisters. She reports breast cancer in her mother at age 80, also in her her maternal aunt, and her paternal grandmother. Her maternal aunt also has a history of lung cancer.  The patient also reports prostate cancer in a maternal cousin in his 10's.   GYNECOLOGIC HISTORY:  No LMP recorded. Patient has had a hysterectomy. Menarche: 55 years old Age at first live birth: 55 years old  [place] P 2 LMP 2010 with hysterectomy Contraceptive: prior use without issue HRT never used  Hysterectomy? Yes, 2010 for fibroids BSO? 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 She is divorced. She lives at home with son [name], age 26, who works for [employer]. Son [place], Brooke Bonito, age 44, works as a Development worker, international aid in [place], [place]. Kisa has no grandchildren. She attends [employer].    ADVANCED DIRECTIVES: not in place; considering naming mother Melody Haver, son [name], or son Raynald as healthcare powers of  attorney   HEALTH MAINTENANCE: Social History   Tobacco Use   Smoking status: Former    Packs/day: 0.25    Years: 36.00    Pack years: 9.00    Types: Cigarettes    Quit date: 04/22/2017    Years since quitting: 3.8   Smokeless tobacco: Never   Tobacco comments:    She is trying to quit.    Vaping Use   Vaping Use: Never used  Substance Use Topics   Alcohol use: Yes    Alcohol/week: 3.0 standard drinks    Types: 3 Glasses of wine per week    Comment: Occasional    Drug use: Never     Colonoscopy: age 60  PAP: date unsure  Bone density: never done   No Known Allergies  Current Outpatient Medications  Medication Sig Dispense Refill   acetaminophen (TYLENOL) 500 MG tablet Take 1,000 mg by mouth every 6 (six) hours as needed for moderate pain.     Boric Acid Vaginal 600 MG SUPP Place 600 mg vaginally daily as needed (yeast infections).     Cholecalciferol (VITAMIN D) 125 MCG (5000 UT) CAPS Take 5,000 Units by mouth every morning.     clobetasol ointment (TEMOVATE) 9.47 % Apply 1 application topically 2 (two) times daily as needed (rash/itching).     Cyanocobalamin (B-12) 5000 MCG CAPS Take 5,000 mcg by mouth every morning.     loperamide (IMODIUM) 2 MG capsule Take 2 tablets after first diarrheal bowel movement, then one tablet after each following diarrheal movement; maximum 6 tablets/ day (Patient taking differently: 2-4 mg See admin instructions. Take 2 tablets  (4 mg) by mouth after first diarrheal bowel movement, then one tablet (2 mg) after each following diarrheal movement; maximum 6 tablets/ day) 60 capsule 6   methocarbamol (ROBAXIN) 500 MG tablet Take 1 tablet (500 mg total) by mouth every 6 (six) hours as needed for muscle spasms. 30 tablet 1   neomycin-bacitracin-polymyxin (NEOSPORIN) OINT Apply 1 application topically 2 (two) times daily. 60 g 0   prochlorperazine (COMPAZINE) 10 MG tablet Take 1 tablet (10 mg total) by mouth every 6 (six) hours as needed for nausea or  vomiting. 30 tablet 4   propranolol (INDERAL) 10 MG tablet Take 10 mg by mouth 2 (two) times daily.     Sodium Chloride Flush (NORMAL SALINE FLUSH) 0.9 % SOLN Inject 10 mLs into the vein daily. 300 mL 0   spironolactone (ALDACTONE) 25 MG tablet Take 25 mg by mouth 2 (two) times daily.     No current facility-administered medications for this visit.    OBJECTIVE: African-American woman who appears stated age 57:   02/25/21 1021  BP: (!) 142/95  Pulse: 99  Resp: 18  Temp: 97.9 F (36.6 C)  SpO2: 100%       Body mass index is 27.85 kg/m.   Wt Readings from  Last 3 Encounters:  02/25/21 194 lb 2 oz (88.1 kg)  02/02/21 201 lb (91.2 kg)  02/01/21 201 lb 1.6 oz (91.2 kg)     ECOG FS:1 - Symptomatic but completely ambulatory  Sclerae unicteric, EOMs intact Wearing a mask No cervical or supraclavicular adenopathy Lungs no rales or rhonchi Heart regular rate and rhythm Abd soft, nontender, positive bowel sounds MSK no focal spinal tenderness, no right upper extremity lymphedema Neuro: nonfocal, well oriented, positive affect Breasts: The right breast is status postmastectomy with associated axillary lymph node dissection.  There is no evidence of chest wall recurrence.  This area is currently receiving radiation.  There is significant hyperpigmentation mild dry desquamation.  The left breast and left axilla are benign.  LAB RESULTS:  CMP     Component Value Date/Time   NA 130 (L) 02/13/2021 0605   K 3.9 02/13/2021 0605   CL 104 02/13/2021 0605   CO2 23 02/13/2021 0605   GLUCOSE 119 (H) 02/13/2021 0605   BUN 10 02/13/2021 0605   CREATININE 1.04 (H) 02/13/2021 0605   CREATININE 0.93 12/31/2020 1554   CREATININE 0.69 09/24/2013 0819   CALCIUM 8.8 (L) 02/13/2021 0605   PROT 6.4 (L) 02/13/2021 0605   ALBUMIN 2.4 (L) 02/13/2021 0605   AST 40 02/13/2021 0605   AST 16 12/31/2020 1554   ALT 42 02/13/2021 0605   ALT 11 12/31/2020 1554   ALKPHOS 175 (H) 02/13/2021 0605    BILITOT 2.8 (H) 02/13/2021 0605   BILITOT 0.4 12/31/2020 1554   GFRNONAA >60 02/13/2021 0605   GFRNONAA >60 12/31/2020 1554   GFRNONAA >89 09/24/2013 0819   GFRAA >89 09/24/2013 0819    No results found for: TOTALPROTELP, ALBUMINELP, A1GS, A2GS, BETS, BETA2SER, GAMS, MSPIKE, SPEI  Lab Results  Component Value Date   WBC 11.5 (H) 02/13/2021   NEUTROABS 10.2 (H) 02/12/2021   HGB 9.2 (L) 02/13/2021   HCT 28.3 (L) 02/13/2021   MCV 102.2 (H) 02/13/2021   PLT 403 (H) 02/13/2021    No results found for: LABCA2  No components found for: [id number]  No results for input(s): INR in the last 168 hours.   No results found for: LABCA2  No results found for: JGG836  No results found for: OQH476  No results found for: LYY503  No results found for: CA2729  No components found for: HGQUANT  No results found for: CEA1 / No results found for: CEA1   No results found for: AFPTUMOR  No results found for: CHROMOGRNA  No results found for: KPAFRELGTCHN, LAMBDASER, KAPLAMBRATIO (kappa/lambda light chains)  No results found for: HGBA, HGBA2QUANT, HGBFQUANT, HGBSQUAN (Hemoglobinopathy evaluation)   No results found for: LDH  No results found for: IRON, TIBC, IRONPCTSAT (Iron and TIBC)  No results found for: FERRITIN  Urinalysis    Component Value Date/Time   COLORURINE AMBER (A) 02/01/2021 [place] 02/01/2021 1658   LABSPEC 1.033 (H) 02/01/2021 1658   PHURINE 5.0 02/01/2021 1658   GLUCOSEU NEGATIVE 02/01/2021 1658   HGBUR NEGATIVE 02/01/2021 1658   BILIRUBINUR NEGATIVE 02/01/2021 1658   KETONESUR NEGATIVE 02/01/2021 1658   PROTEINUR NEGATIVE 02/01/2021 1658   NITRITE NEGATIVE 02/01/2021 1658   LEUKOCYTESUR NEGATIVE 02/01/2021 1658    STUDIES: CT ABDOMEN PELVIS WO CONTRAST  Result Date: 02/06/2021 CLINICAL DATA:  History of metastatic breast cancer with jaundice, post placement of percutaneous biliary drainage catheter on 02/03/2021 EXAM: CT ABDOMEN  AND PELVIS WITHOUT CONTRAST TECHNIQUE: Multidetector CT imaging of the  abdomen and pelvis was performed following the standard protocol without IV contrast. COMPARISON:  CT abdomen and pelvis-02/01/2021; 12/29/2020 MRCP-02/01/2021 image guided placement of percutaneous biliary drainage catheter-02/03/2021 FINDINGS: Lower chest: Limited visualization of the lower thorax demonstrates bibasilar subsegmental atelectasis, left greater than right. No pleural effusion. The tip of a port a catheter terminates at the level of the superior cavoatrial junction. Normal heart size. Trace amount of pericardial fluid, presumably physiologic. There is diffuse decreased attenuation of the intra cardiac blood pool suggestive of anemia. Coronary artery calcifications. Hepatobiliary: Interval placement of left hepatic lobe approach internal/external biliary drainage catheter with radiopaque side marker located at the level of the biliary hilum and distal end appropriately positioned within the descending portion of the duodenum. Evaluation for residual intrahepatic biliary ductal dilatation is degraded secondary to the lack of intravenous contrast. The gallbladder is filled with radiopaque material, likely contrast from placement of the internal/external biliary drainage catheter. No definitive gallbladder wall thickening or pericholecystic stranding on this noncontrast examination. No ascites. Pancreas: Questioned pancreatic head mass on preceding MRCP is suboptimally evaluated on this noncontrast examination (image 38, series 2), however there has been development of perihepatic ascites seen both at the level of the gastrohepatic ligament (image 29, series 2) as well as within the adjacent ventral aspect of the abdominal mesentery (image 43, series 2). While incompletely evaluated this noncontrast examination there is no definitive organization of this fluid to suggest the presence of a pseudo cyst. Spleen: Normal noncontrast  appearance of the spleen. Adrenals/Urinary Tract: Redemonstrated moderate right-sided ureterectasis and pelvicaliectasis with abrupt tapering at the mid aspect of the right ureter (image 62, series 2). Again, the etiology of this obstruction is not depicted on this examination, specifically, no radiopaque renal stones. Normal noncontrast appearance of the left kidney. No evidence of left-sided nephrolithiasis or urinary obstruction. Normal noncontrast appearance of the bilateral adrenal glands. Normal noncontrast appearance of the urinary bladder given underdistention. Stomach/Bowel: Ingested enteric contrast extends to the level of the proximal descending colon. Moderate colonic stool burden without evidence of enteric obstruction. Normal noncontrast appearance of the terminal ileum. The appendix is not visualized, however there is no pericecal inflammatory change on this noncontrast examination. No significant hiatal hernia. No pneumoperitoneum, pneumatosis or portal venous gas. Vascular/Lymphatic: Scattered atherosclerotic plaque within a normal caliber abdominal aorta. Redemonstrated scattered retroperitoneal adenopathy with index left-sided periaortic lymph node measuring 1.3 cm in greatest short axis diameter (image 44, series 2). Reproductive: Post hysterectomy. No discrete adnexal lesions. Interval development of small amount of free fluid in the pelvic cul-de-sac. Other: There is a minimal amount of subcutaneous edema about the midline of the low back. Musculoskeletal: Redemonstrated rather extensive osseous metastatic disease with multiple predominantly sclerotic lesions seen throughout the thoracic and lumbar spine, the pelvis, the inferior tip of the right scapula (image 12, series 2) and several right-sided ribs. No definitive evidence of impending pathologic fracture. Regional soft tissues appear normal. IMPRESSION: 1. Stable sequela of internal/external left-sided biliary drainage catheter placement.  The drainage catheter is appropriately positioned with radiopaque marker at the level of the biliary hilum and distal coil located within the duodenum. Evaluation for residual intrahepatic biliary ductal dilatation is suboptimally evaluated on this noncontrast examination though both the right and left biliary systems were noted to communicate at the time of biliary drainage catheter placement on 02/03/2021. 2. Worsening peripancreatic stranding/fluid worrisome for progressive pancreatitis, incompletely evaluated on this noncontrast examination. Note, questioned pancreatic head mass on recent MRCP is suboptimally evaluated on  this noncontrast abdominal CT. 3. Redemonstrated right-sided pelvicaliectasis and ureterectasis to the level of the mid aspect of the right ureter, the etiology of which is not depicted on this examination. Specifically, no radiopaque renal stones. 4. Redemonstrated extensive osseous metastatic disease and mild retroperitoneal lymphadenopathy. 5.  Aortic Atherosclerosis (ICD10-I70.0). Electronically Signed   By: Sandi Mariscal M.D.   On: 02/06/2021 15:27   CT Abdomen Pelvis W Contrast  Result Date: 02/01/2021 CLINICAL DATA:  Itching in jaundice for 7 days. Shortness of breath. Dark colored urine. Nausea. Radiation therapy yesterday. History of breast cancer with osseous metastatic disease. EXAM: CT ABDOMEN AND PELVIS WITH CONTRAST TECHNIQUE: Multidetector CT imaging of the abdomen and pelvis was performed using the standard protocol following bolus administration of intravenous contrast. CONTRAST:  34m OMNIPAQUE IOHEXOL 350 MG/ML SOLN COMPARISON:  12/29/2020 FINDINGS: Lower chest: Right mastectomy. Stable scarring in the right lower lobe. Hepatobiliary: New substantial intrahepatic biliary dilatation observed along with gallbladder wall thickening and high density material in the gallbladder which could be from sludge, proteinaceous fluid, or blood products. There is narrowing and  substantial wall enhancement in the right and left hepatic ducts as they converge, raising the possibility of cholangitis. Ill definition of the common bile duct probably from wall thickening. No well-defined stones. Pancreas: Stranding around the pancreatic head along with new mildly dilated dorsal pancreatic duct with truncation of caliber in the pancreatic head region, appearance raises some suspicion for focal pancreatitis. I do not see a specific well-defined mass in the pancreatic head. Spleen: Unremarkable Adrenals/Urinary Tract: New abnormal hydronephrosis and proximal hydroureter extending down to a 8.5 cm dilated segment of the right distal ureter which contains high internal density; this is new compared to the 12/29/2020 exam and accordingly raises suspicion for a blood clot rather than tumor. Mildly delayed nephrogram on the right. No well-defined stone. Adrenal glands unremarkable. Urinary bladder unremarkable. Stomach/Bowel: No dilated bowel observed.  Normal appendix. Vascular/Lymphatic: Left periaortic lymph node 1.3 cm in short axis on image 42 series 2, previously 1.9 cm. Other retroperitoneal lymph nodes are likewise improved compared to previous. Reproductive: Uterus absent.  Adnexa unremarkable. Other: Stable mild retroperitoneal stranding. Musculoskeletal: Stable widespread scattered sclerotic lesions indicative of prior osseous metastatic disease. Small umbilical hernia contains adipose tissue. IMPRESSION: 1. Since 12/29/2020, there is new substantial intrahepatic biliary dilatation along with dorsal pancreatic duct dilatation. There seems to be abnormal enhancement and wall thickening in the common hepatic duct and common bile duct, along with gallbladder wall thickening and gallbladder sludge. In addition there is some stranding around the pancreatic head with truncation of the dilated dorsal pancreatic duct. Possibilities may include cholangitis and/or focal pancreatitis involving the  pancreatic head. Acute cholecystitis not excluded. A discrete pancreatic head mass is not well seen. 2. In addition, there is new substantial right hydronephrosis and delayed nephrogram on the right extending down to a tapering plug of high density material in the proximal ureter, presumably representing a blood clot in the ureter given that the kidney and ureter had a normal appearance 1 month ago. Tumor in the ureter seems unlikely in this time frame. Severe ureteritis might be a differential diagnostic consideration. 3. Mildly reduced retroperitoneal adenopathy, although the retroperitoneal stranding continues. 4. Stable widespread scattered sclerotic lesions indicative of prior metastatic disease. Electronically Signed   By: WVan ClinesM.D.   On: 02/01/2021 16:31   [place]  Result Date: 02/04/2021 CLINICAL DATA:  55year old female for follow-up of hydronephrosis. EXAM: RENAL / URINARY TRACT  ULTRASOUND COMPLETE COMPARISON:  02/02/2021 FINDINGS: Right Kidney: Renal measurements: 10.8 x 7.1 x 7.6 cm = volume: 303 mL. RIGHT hydronephrosis appears slightly improved since 02/02/2021, now moderate to severe. No renal mass is identified. Renal echogenicity is UPPER limits of normal. Left Kidney: Renal measurements: 10.9 x 6.5 x 6.9 cm = volume: 253 mL. Mild LEFT hydronephrosis is not significantly changed. No renal mass identified. Renal echogenicity is UPPER limits of normal. Bladder: RIGHT renal jet is again not visualized. A normal LEFT ureteral jet is present. No other abnormalities noted. Other: None. IMPRESSION: 1. Apparent slight improvement in RIGHT hydronephrosis, now moderate to severe. RIGHT ureteral jet again not visualized compatible with high-grade obstruction. 2. Unchanged mild LEFT hydronephrosis. 3. No other significant change. Electronically Signed   By: Margarette Canada M.D.   On: 02/04/2021 10:35   US RENAL  Result Date: 02/02/2021 CLINICAL DATA:  Hydronephrosis EXAM: RENAL / URINARY  TRACT ULTRASOUND COMPLETE COMPARISON:  02/01/2021 FINDINGS: Right Kidney: Renal measurements: 6.2 x 11.2 x 5.9 cm = volume: 217 mL. Normal right renal cortical echotexture. There is severe right-sided hydronephrosis unchanged since recent CT and MRI. No evidence of nephrolithiasis. Left Kidney: Renal measurements: 5.7 x 11.3 x 4.7 cm = volume: 159 mL. Normal left renal cortical echotexture. There is mild left-sided hydronephrosis, increased since recent ultrasound. No nephrolithiasis. Bladder: A left ureteral jet is identified. The right ureteral jet is not seen. No filling defects or bladder wall thickening. Other: None. IMPRESSION: 1. Severe right-sided hydronephrosis, not appreciably changed since recent CT. There is no right ureteral jet identified within the bladder, compatible with high-grade obstruction. 2. Mild left-sided hydronephrosis, increased since prior study. 3. No evidence of nephrolithiasis. Electronically Signed   By: Randa Ngo M.D.   On: 02/02/2021 20:41   MR ABDOMEN MRCP WO CONTRAST  Result Date: 02/01/2021 CLINICAL DATA:  Jaundice. Biliary ductal dilatation and right hydronephrosis on recent CT. Metastatic breast carcinoma. EXAM: MRI ABDOMEN WITHOUT CONTRAST  (INCLUDING MRCP) TECHNIQUE: Multiplanar multisequence MR imaging of the abdomen was performed. Heavily T2-weighted images of the biliary and pancreatic ducts were obtained, and three-dimensional MRCP images were rendered by post processing. COMPARISON:  CT on 02/01/2021 FINDINGS: Lower chest: No acute findings. Hepatobiliary: No hepatic masses visualized on this unenhanced exam. Diffuse dilatation of the intrahepatic bile ducts is seen due to stricture of the common hepatic duct in the liver hilum. No evidence of cholecystitis. Pancreas: Pancreatic ductal dilatation is seen with obstruction in the pancreatic head. A solid T2 hypointense mass is seen in the pancreatic head measuring 3.6 x 3.6 cm on image 22/4, which is new since  earlier CT on 12/29/2020. Adjacent soft tissue edema is seen within the porta hepatis and throughout the peripancreatic region. This rapid appearance in the past month is suggestive of focal pancreatitis, with metastatic disease or primary pancreatic carcinoma considered less likely. Spleen:  Within normal limits in size. Adrenals/Urinary tract: No evidence of renal mass or abscess. Moderate right hydronephrosis is also new since 12/29/2020, with soft tissue thickening seen involving the proximal right ureter, with adjacent retroperitoneal edema. These findings may be secondary to pancreatitis, with metastatic disease considered less likely. Stomach/Bowel: No evidence of obstruction or wall thickening. Vascular/Lymphatic: Mild retroperitoneal lymphadenopathy is seen with largest lymph node measuring 1.5 cm on image 27/4. This shows mild decrease from 2.4 cm on earlier CT of 12/29/2020. Other:  None. 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 IMPRESSION: 3.6 cm masslike area in the pancreatic  head with adjacent peripancreatic edema, which also causes diffuse biliary and pancreatic ductal dilatation. Rapid appearance since prior CT approximately 1 month ago is suggestive of focal pancreatitis, although metastatic disease cannot be excluded. Moderate right hydronephrosis with soft tissue prominence involving the proximal right ureter. These findings may also be secondary to acute pancreatitis, although metastatic disease cannot be excluded. Mild decrease in retroperitoneal lymphadenopathy since earlier CT on 10/18/202222. Diffuse bone metastases. Electronically Signed   By: Marlaine Hind M.D.   On: 02/01/2021 21:54   MR 3D Recon At Scanner  Result Date: 02/01/2021 CLINICAL DATA:  Jaundice. Biliary ductal dilatation and right hydronephrosis on recent CT. Metastatic breast carcinoma. EXAM: MRI ABDOMEN WITHOUT CONTRAST  (INCLUDING MRCP) TECHNIQUE: Multiplanar multisequence  MR imaging of the abdomen was performed. Heavily T2-weighted images of the biliary and pancreatic ducts were obtained, and three-dimensional MRCP images were rendered by post processing. COMPARISON:  CT on 02/01/2021 FINDINGS: Lower chest: No acute findings. Hepatobiliary: No hepatic masses visualized on this unenhanced exam. Diffuse dilatation of the intrahepatic bile ducts is seen due to stricture of the common hepatic duct in the liver hilum. No evidence of cholecystitis. Pancreas: Pancreatic ductal dilatation is seen with obstruction in the pancreatic head. A solid T2 hypointense mass is seen in the pancreatic head measuring 3.6 x 3.6 cm on image 22/4, which is new since earlier CT on 12/29/2020. Adjacent soft tissue edema is seen within the porta hepatis and throughout the peripancreatic region. This rapid appearance in the past month is suggestive of focal pancreatitis, with metastatic disease or primary pancreatic carcinoma considered less likely. Spleen:  Within normal limits in size. Adrenals/Urinary tract: No evidence of renal mass or abscess. Moderate right hydronephrosis is also new since 12/29/2020, with soft tissue thickening seen involving the proximal right ureter, with adjacent retroperitoneal edema. These findings may be secondary to pancreatitis, with metastatic disease considered less likely. Stomach/Bowel: No evidence of obstruction or wall thickening. Vascular/Lymphatic: Mild retroperitoneal lymphadenopathy is seen with largest lymph node measuring 1.5 cm on image 27/4. This shows mild decrease from 2.4 cm on earlier CT of 12/29/2020. Other:  None. 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 IMPRESSION: 3.6 cm masslike area in the pancreatic head with adjacent peripancreatic edema, which also causes diffuse biliary and pancreatic ductal dilatation. Rapid appearance since prior CT approximately 1 month ago is suggestive of focal pancreatitis, although  metastatic disease cannot be excluded. Moderate right hydronephrosis with soft tissue prominence involving the proximal right ureter. These findings may also be secondary to acute pancreatitis, although metastatic disease cannot be excluded. Mild decrease in retroperitoneal lymphadenopathy since earlier CT on 10/18/202222. Diffuse bone metastases. Electronically Signed   By: Marlaine Hind M.D.   On: 02/01/2021 21:54   DG ERCP BILIARY & PANCREATIC DUCTS  Result Date: 02/02/2021 CLINICAL DATA:  ERCP EXAM: ERCP TECHNIQUE: Multiple spot images obtained with the fluoroscopic device and submitted for interpretation post-procedure. FLUOROSCOPY TIME:  Refer to procedure report. COMPARISON:  None. FINDINGS: A total of 2 fluoroscopic spot images are submitted for review taken during ERCP procedure. Images demonstrate a scope overlying the upper abdomen. There then appears to be wire catheterization or attempted catheterization through the ampulla. No further images submitted. IMPRESSION: Fluoroscopic spot images during ERCP as described. Refer to procedure report for full details. These images were submitted for radiologic interpretation only. Please see the procedural report for the amount of contrast and the fluoroscopy time utilized. Electronically Signed   By: Murrell Redden  El-Abd M.D.   On: 02/02/2021 16:50   DG C-Arm 1-60 Min-No Report  Result Date: 02/02/2021 Fluoroscopy was utilized by the requesting physician.  No radiographic interpretation.   IR BILIARY DRAIN PLACEMENT WITH CHOLANGIOGRAM  Result Date: 02/03/2021 INDICATION: 55 year old woman with metastatic breast cancer and common bile duct obstruction presents to interventional radiology for biliary drain placement. EXAM: Ultrasound and fluoroscopy guided internal external biliary drain placement MEDICATIONS: Cefoxitin 2 g IV; The antibiotic was administered within an appropriate time frame prior to the initiation of the procedure. ANESTHESIA/SEDATION:  Moderate (conscious) sedation was employed during this procedure. A total of Versed 3 mg and Fentanyl 100 mcg was administered intravenously by the radiology nurse. Total intra-service moderate Sedation Time: 17 minutes. The patient's level of consciousness and vital signs were monitored continuously by radiology nursing throughout the procedure under my direct supervision. FLUOROSCOPY TIME:  Fluoroscopy Time: 2 minutes 54 seconds (41 mGy). COMPLICATIONS: None immediate. PROCEDURE: Informed written consent was obtained from the patient after a thorough discussion of the procedural risks, benefits and alternatives. All questions were addressed. Maximal Sterile Barrier Technique was utilized including caps, mask, sterile gowns, sterile gloves, sterile drape, hand hygiene and skin antiseptic. A timeout was performed prior to the initiation of the procedure. Patient positioned supine on the procedure table. Epigastric skin prepped and draped in usual fashion. Ultrasound evaluation demonstrated moderate dilated left bile ducts. Using continuous ultrasound guidance, left hepatic lobe bile duct was accessed with a 21 gauge needle. Contrast administered through the needle under fluoroscopy opacified the biliary tree. 0.018 inch guidewire was advanced through the 21 gauge needle. 21 gauge needle exchanged for transitional dilator set. Transitional dilator set exchanged for Kumpe the catheter over 0.035 inch guidewire. Kumpe catheter advanced to the duodenum. 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 Kumpe catheter exchanged for 10 French internal external biliary drain. Contrast administered through the drain under fluoroscopy confirmed appropriate positioning. Drain was secured to skin with suture and connected to bag. IMPRESSION: 1. Ultrasound fluoroscopy guided 10 French internal external biliary drain placement as above. 2. 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 Electronically Signed   By: Miachel Roux M.D.   On: 02/03/2021 15:37   IR EXCHANGE BILIARY DRAIN  Result Date: 02/11/2021 INDICATION: 55 year old female with history of metastatic breast cancer and biliary obstruction status post left percutaneous transhepatic, internal external biliary drain placement on 02/03/2021. EXAM: 1. Cholangiogram. 2. Biliary drain exchange. 3. Biliary brush biopsy. MEDICATIONS: Rocephin 2 gm IV; The antibiotic was administered within an appropriate time frame prior to the initiation of the procedure. ANESTHESIA/SEDATION: Moderate (conscious) sedation was employed during this procedure. A total of Versed 2 mg and Fentanyl 100 mcg was administered intravenously. Moderate Sedation Time: 19 minutes. The patient's level of consciousness and vital signs were monitored continuously by radiology nursing throughout the procedure under my direct supervision. FLUOROSCOPY TIME:  Fluoroscopy Time: 3 minutes 24 seconds (34 mGy). COMPLICATIONS: None immediate. PROCEDURE: Informed written consent was obtained from the patient after a thorough discussion of the procedural risks, benefits and alternatives. All questions were addressed. Maximal Sterile Barrier Technique was utilized including caps, mask, sterile gowns, sterile gloves, sterile drape, hand hygiene and skin antiseptic. A timeout was performed prior to the initiation of the procedure. The upper abdomen and indwelling left-sided biliary drain were prepped and draped in standard fashion. Preprocedure scout radiograph demonstrated unchanged position of the indwelling biliary drain. Gentle hand injection demonstrated no significant intrahepatic biliary ductal dilation.  Subdermal Local anesthesia was administered around the indwelling biliary drain. External suture was cut. The external portion of the tube was cut to release the pigtail and an Amplatz wire was inserted with its tip in the distal duodenum. The tube was removed over the wire. An 8 [place], 25 cm Brite tip sheath was  then inserted with the distal tip in the duodenum. The Amplatz wire was exchanged for a 018 glidewire. Next, 3 passes were made near the biliary confluence and superior aspect of the common bile duct with separate brush biopsy sets. The brushes were cut in placed in saline and sent to pathology. The sheath was then readvanced into the small bowel and the Glidewire was exchanged for an Amplatz wire. The sheath was then removed and a new, 10.2 [place] biliary drain was placed with the pigtail portion coiled in the duodenum and the radiopaque marker in a peripheral left-sided bile duct. Gentle hand injection confirmed location and patency with brisk antegrade flow into the duodenum. The drain was locked and reinforced at the skin entry site with a silk suture. A cap was placed on the drain sterile bandage was applied. The patient tolerated procedure well was transferred back to the floor in good condition. IMPRESSION: 1. Technically successful brush biopsy of the hilar confluence and superior aspect of the common bile duct. 2. Technically successful biliary drain exchange for a new 10.2 [place] biliary drain. The drain was capped. PLAN: Keep the drain capped and internalized unless the patient develops symptoms of recurrent biliary obstruction, in which case the drain should be placed back to bag drainage. Plan follow-up in Interventional Radiology in 8 weeks for biliary drain check and change, or sooner if desired for internal biliary stent placement and removal of percutaneous drain. Ruthann Cancer, MD [facility] Radiology Electronically Signed   By: Ruthann Cancer M.D.   On: 02/11/2021 13:33   IR ENDOLUMINAL BX OF BILIARY TREE  Result Date: 02/11/2021 INDICATION: 55 year old female with history of metastatic breast cancer and biliary obstruction status post left percutaneous transhepatic, internal external biliary drain placement on 02/03/2021. EXAM: 1. Cholangiogram.  2. Biliary drain exchange. 3. Biliary brush biopsy. MEDICATIONS: Rocephin 2 gm IV; The antibiotic was administered within an appropriate time frame prior to the initiation of the procedure. ANESTHESIA/SEDATION: Moderate (conscious) sedation was employed during this procedure. A total of Versed 2 mg and Fentanyl 100 mcg was administered intravenously. Moderate Sedation Time: 19 minutes. The patient's level of consciousness and vital signs were monitored continuously by radiology nursing throughout the procedure under my direct supervision. FLUOROSCOPY TIME:  Fluoroscopy Time: 3 minutes 24 seconds (34 mGy). COMPLICATIONS: None immediate. PROCEDURE: Informed written consent was obtained from the patient after a thorough discussion of the procedural risks, benefits and alternatives. All questions were addressed. Maximal Sterile Barrier Technique was utilized including caps, mask, sterile gowns, sterile gloves, sterile drape, hand hygiene and skin antiseptic. A timeout was performed prior to the initiation of the procedure. The upper abdomen and indwelling left-sided biliary drain were prepped and draped in standard fashion. Preprocedure scout radiograph demonstrated unchanged position of the indwelling biliary drain. Gentle hand injection demonstrated no significant intrahepatic biliary ductal dilation. Subdermal Local anesthesia was administered around the indwelling biliary drain. External suture was cut. The external portion of the tube was cut to release the pigtail and an Amplatz wire was inserted with its tip in the distal duodenum. The tube was removed over the wire. An 8 [place], 25 cm  Brite tip sheath was then inserted with the distal tip in the duodenum. The Amplatz wire was exchanged for a 018 glidewire. Next, 3 passes were made near the biliary confluence and superior aspect of the common bile duct with separate brush biopsy sets. The brushes were cut in placed in saline and sent to pathology. The sheath was  then readvanced into the small bowel and the Glidewire was exchanged for an Amplatz wire. The sheath was then removed and a new, 10.2 [place] biliary drain was placed with the pigtail portion coiled in the duodenum and the radiopaque marker in a peripheral left-sided bile duct. Gentle hand injection confirmed location and patency with brisk antegrade flow into the duodenum. The drain was locked and reinforced at the skin entry site with a silk suture. A cap was placed on the drain sterile bandage was applied. The patient tolerated procedure well was transferred back to the floor in good condition. IMPRESSION: 1. Technically successful brush biopsy of the hilar confluence and superior aspect of the common bile duct. 2. Technically successful biliary drain exchange for a new 10.2 [place] biliary drain. The drain was capped. PLAN: Keep the drain capped and internalized unless the patient develops symptoms of recurrent biliary obstruction, in which case the drain should be placed back to bag drainage. Plan follow-up in Interventional Radiology in 8 weeks for biliary drain check and change, or sooner if desired for internal biliary stent placement and removal of percutaneous drain. Ruthann Cancer, MD [facility] Radiology Electronically Signed   By: Ruthann Cancer M.D.   On: 02/11/2021 13:33   US Abdomen Limited RUQ (LIVER/GB)  Result Date: 01/28/2021 CLINICAL DATA:  Elevated bilirubin. EXAM: ULTRASOUND ABDOMEN LIMITED RIGHT UPPER QUADRANT COMPARISON:  CT temporal bones, 12/29/2020. FINDINGS: Gallbladder: Nondistended, sludge-filled gallbladder with gallbladder wall measuring near the upper limit of normal in thickness, at 0.4 cm. No sonographic Murphy sign noted by sonographer. Common bile duct: Diameter: 0.6 cm Liver: No focal lesion identified. Within normal limits in parenchymal echogenicity. Portal vein is patent on color Doppler imaging with normal direction of blood  flow towards the liver. Other: No ascites. IMPRESSION: Biliary sludge without additional sonographic evidence of acute cholecystitis. If concern for biliary dyskinesia, recommend NM HIDA for further evaluation. Electronically Signed   By: Michaelle Birks M.D.   On: 01/28/2021 08:12     ELIGIBLE FOR AVAILABLE RESEARCH PROTOCOL: no  ASSESSMENT: 55 y.o. [place] woman status post right breast upper outer quadrant biopsy 03/27/2020 for a clinical T1c N3 M1, stageIV invasive ductal carcinoma, functionally triple negative, with an MIB-1 of 30-40%  (a) chest CT scan 04/20/2020 shows in addition to the breast mass and regional adenopathy, lytic and sclerotic bone changes, possible left thoracic adenopathy, and indeterminant left pleural nodularity  (b) bone scan 04/21/2020 confirms multiple bone lesions  (c) thoracic MRI confirms diffuse bony metastatic disease but shows no epidural soft tissue component and no cord compression  (1) genetics testing 04/24/2020 through the [employer] [facility] [employer] panel found no deleterious mutations in APC, ATM, AXIN2, BARD1, BMPR1A, BRCA1, BRCA2, BRIP1, CDH1, CDK4, CDKN2A, CHEK2, DICER1, EPCAM, GREM1, HOXB13, MEN1, MLH1, MSH2, MSH3, MSH6, MUTYH, NBN, NF1, NF2, NTHL1, PALB2, PMS2, POLD1, POLE, PTEN, RAD51C, RAD51D, RECQL, RET, SDHA, SDHAF2, SDHB, SDHC, SDHD, SMAD4, SMARCA4, STK11, TP53, TSC1, TSC2, and VHL.  RNA data is routinely analyzed for use in variant interpretation for all genes.  (2) neoadjuvant chemotherapy consisted of pembrolizumab every 3 weeks x 4 with carboplatin  every 3 weeks x 4 with paclitaxel weekly x12, started 04/24/2020, completed 07/10/2020  (a) did not receive pembolizumab, doxorubicin,cyclophosphamide every 3 weeks x 4 but proceeded to observation  (3) zoledronate started 05/08/2018, repeated every 12 weeks  (4) molecular studies:  (A) PT-L1 combined positive score of 5 (on January 2022 specimen)  (B) foundation 1 from the 03/27/2020  sample was microsatellite stable, with low mutational burden (0), and no reportable alterations in BRCA1 or 2, ER BB 2, or PIK 3 CA.  There was REL amplification and a TP53 mutation.  (5) restaging studies:  (a) post-chemo breast MRI 06/30/2020 shows significant response  (b) CT chest 07/09/2020 shows decreased right axillary adenopathy, no lung or liver lesions, healing of lytic bone lesions  (c) CT chest 10/07/2020 shows progression of the right axillary adenopathy and upper abdominal retroperitoneal adenopathy, with stable scattered sclerotic bone lesions--this led to surgery (see #9 below)  (6) adjuvant pembrolizumab resumed 07/31/2020, discontinued after 10/02/2020 dose with disease progression  (7) anastrozole started 07/26/2020 given (weak) estrogen and progesterone receptor positivity  (A) MRI breast 09/28/2020 shows local progression in the right breast and axilla-- anastrozole discontinued   (8) fulvestrant started 10/08/2020  (A) abemaciclib added 10/24/2018  (B) fulvestrant discontinued after right modified radical mastectomy with repeat prognostic panel showing the tumor to be triple negative  (C) abemaciclib discontinued November 2022 with increased liver function tests  (9) status post right modified radical mastectomy on 12/07/2020 for a residual ympT2 ypN3a invasive ductal carcinoma, e-cadherin positive, grade 3, with negative margins  (a) a total of 13 right axillary lymph nodes removed, 12 positive, with evidence of extracapsular extension; residual cancer burden III  (b) repeat prognostic panel estrogen and progesterone receptor negative, HER2 negative (1+).    (10) adjuvant radiation to be completed 03/09/2021  (11) admitted 02/01/2021 with worsening biliary obstruction:             (A) CT scans of the chest abdomen and pelvis 12/29/2020 show no liver or lung involvement.  There is some left retroperitoneal adenopathy which may be the only site of measurable disease and  there are some bone lesions.             (B) right upper quadrant abdominal ultrasound 01/28/2021 shows gallbladder sludge, no liver lesions              (C) CT scan of the abdomen and pelvis 02/01/2021 shows intrahepatic biliary dilatation, gallbladder wall thickening, narrowing of the hepatic ducts, stranding around the pancreatic head, and new right hydroureter             (D) noncontrast MRI of the abdomen 02/01/2021 shows no liver masses, dilatation of the intrahepatic bile ducts with stricture of the common hepatic duct in the liver hilum, no evidence of cholecystitis, a solid 3.6 cm mass in the pancreatic head consistent with focal pancreatitis, moderate right hydronephrosis             (E) ERCP 02/02/2021--unable to cannulate CBD             (F) external biliary drain placement with cholangiogram 02/02/2021; 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  There was no abnormality noted in the liver.  Brushings from this procedure were read as suspicious for malignancy  PLAN: [name] is coming up in a year from initial diagnosis of metastatic breast cancer.  Her disease has been very difficult to evaluate.  She had apparent pancreatitis or abdominal inflammation or problems with her gallbladder and biliary obstruction but there is no obviously obstructing mass and we have not been able to document cancer in those areas to cause those problems.  I think she is going to need a PET scan to help [place] resolve this riddle  I have placed that order.  We have treated her breast cancer systemically with antiestrogens based on the few indications of estrogen positivity.  I think we have established that this cancer is not estrogen responsive.  When she returns  to see [place] [id number] we will therefore consider other possibilities which primarily include chemotherapy but may also include targeted therapies or immunotherapy.  Kanda is aware of this and is eager to get started with systemic treatment.  She already has a port in place  She is doing a good job at managing her external biliary drain.  I am not sure why she is having a burning feeling in her bladder.  It is possible that there is tumor involvement of the bladder.  If this persists as she might need cystoscopy to clarify that problem.    She is at high risk for right upper extremity lymphedema but so far has not developed this.  At some point referral to physical therapy to learn how to do massage and to set her up for sleeve probably would be a good idea.  Total encounter time 35 minutes.Sarajane Jews C. Amarilys Lyles, MD 02/25/21 10:42 AM Medical Oncology and Hematology [facility] [place], [place] [address] Tel. [phone]    Fax. [phone]   I, Wilburn Mylar, am acting as scribe for Dr. Virgie Dad. Maximo Spratling.  I, Lurline Del MD, have reviewed the above documentation for accuracy and completeness, and I agree with the above.   *Total Encounter Time as defined by the Centers for Medicare and Medicaid Services includes, in addition to the face-to-face time of a patient visit (documented in the note above) non-face-to-face time: obtaining and reviewing outside history, ordering and reviewing medications, tests or procedures, care coordination (communications with other health care professionals or caregivers) [id number] [phone] in the medical [id number]

## 2021-02-25 ENCOUNTER — Inpatient Hospital Stay: Payer: BC Managed Care – PPO

## 2021-02-25 ENCOUNTER — Ambulatory Visit: Payer: BC Managed Care – PPO

## 2021-02-25 ENCOUNTER — Inpatient Hospital Stay: Payer: BC Managed Care – PPO | Attending: Oncology | Admitting: Oncology

## 2021-02-25 VITALS — BP 142/95 | HR 99 | Temp 97.9°F | Resp 18 | Ht 70.0 in | Wt 194.1 lb

## 2021-02-25 DIAGNOSIS — Z510 Encounter for antineoplastic radiation therapy: Secondary | ICD-10-CM | POA: Insufficient documentation

## 2021-02-25 DIAGNOSIS — Z95828 Presence of other vascular implants and grafts: Secondary | ICD-10-CM

## 2021-02-25 DIAGNOSIS — Z171 Estrogen receptor negative status [ER-]: Secondary | ICD-10-CM | POA: Insufficient documentation

## 2021-02-25 DIAGNOSIS — Z79899 Other long term (current) drug therapy: Secondary | ICD-10-CM | POA: Insufficient documentation

## 2021-02-25 DIAGNOSIS — C50411 Malignant neoplasm of upper-outer quadrant of right female breast: Secondary | ICD-10-CM | POA: Diagnosis not present

## 2021-02-25 DIAGNOSIS — Z923 Personal history of irradiation: Secondary | ICD-10-CM | POA: Insufficient documentation

## 2021-02-25 DIAGNOSIS — C801 Malignant (primary) neoplasm, unspecified: Secondary | ICD-10-CM

## 2021-02-25 DIAGNOSIS — K869 Disease of pancreas, unspecified: Secondary | ICD-10-CM

## 2021-02-25 DIAGNOSIS — Z9011 Acquired absence of right breast and nipple: Secondary | ICD-10-CM | POA: Insufficient documentation

## 2021-02-25 DIAGNOSIS — Z170 Estrogen receptor positive status [ER+]: Secondary | ICD-10-CM

## 2021-02-25 DIAGNOSIS — C7951 Secondary malignant neoplasm of bone: Secondary | ICD-10-CM | POA: Diagnosis not present

## 2021-02-25 DIAGNOSIS — Z79811 Long term (current) use of aromatase inhibitors: Secondary | ICD-10-CM | POA: Insufficient documentation

## 2021-02-25 DIAGNOSIS — K831 Obstruction of bile duct: Secondary | ICD-10-CM | POA: Diagnosis not present

## 2021-02-25 LAB — CBC WITH DIFFERENTIAL/PLATELET
Abs Immature Granulocytes: 0.05 10*3/uL (ref 0.00–0.07)
Basophils Absolute: 0 10*3/uL (ref 0.0–0.1)
Basophils Relative: 0 %
Eosinophils Absolute: 0.1 10*3/uL (ref 0.0–0.5)
Eosinophils Relative: 1 %
HCT: 26.7 % — ABNORMAL LOW (ref 36.0–46.0)
Hemoglobin: 8.6 g/dL — ABNORMAL LOW (ref 12.0–15.0)
Immature Granulocytes: 1 %
Lymphocytes Relative: 18 %
Lymphs Abs: 1.3 10*3/uL (ref 0.7–4.0)
MCH: 31.6 pg (ref 26.0–34.0)
MCHC: 32.2 g/dL (ref 30.0–36.0)
MCV: 98.2 fL (ref 80.0–100.0)
Monocytes Absolute: 0.8 10*3/uL (ref 0.1–1.0)
Monocytes Relative: 11 %
Neutro Abs: 5.2 10*3/uL (ref 1.7–7.7)
Neutrophils Relative %: 69 %
Platelets: 264 10*3/uL (ref 150–400)
RBC: 2.72 MIL/uL — ABNORMAL LOW (ref 3.87–5.11)
RDW: 12.5 % (ref 11.5–15.5)
WBC: 7.4 10*3/uL (ref 4.0–10.5)
nRBC: 0 % (ref 0.0–0.2)

## 2021-02-25 LAB — COMPREHENSIVE METABOLIC PANEL
ALT: 20 U/L (ref 0–44)
AST: 29 U/L (ref 15–41)
Albumin: 2.6 g/dL — ABNORMAL LOW (ref 3.5–5.0)
Alkaline Phosphatase: 212 U/L — ABNORMAL HIGH (ref 38–126)
Anion gap: 9 (ref 5–15)
BUN: 13 mg/dL (ref 6–20)
CO2: 23 mmol/L (ref 22–32)
Calcium: 9.6 mg/dL (ref 8.9–10.3)
Chloride: 106 mmol/L (ref 98–111)
Creatinine, Ser: 0.89 mg/dL (ref 0.44–1.00)
GFR, Estimated: >60 mL/min (ref >60)
Glucose, Bld: 100 mg/dL — ABNORMAL HIGH (ref 70–99)
Potassium: 4.1 mmol/L (ref 3.5–5.1)
Sodium: 138 mmol/L (ref 135–145)
Total Bilirubin: 1.5 mg/dL — ABNORMAL HIGH (ref 0.3–1.2)
Total Protein: 7.2 g/dL (ref 6.5–8.1)

## 2021-02-25 LAB — T4, FREE: Free T4: 1 ng/dL (ref 0.61–1.12)

## 2021-02-25 MED ORDER — HEPARIN SOD (PORK) LOCK FLUSH 100 UNIT/ML IV SOLN
500.0000 [IU] | Freq: Once | INTRAVENOUS | Status: AC
Start: 2021-02-25 — End: 2021-02-25
  Administered 2021-02-25: 500 [IU]

## 2021-02-25 MED ORDER — SODIUM CHLORIDE 0.9% FLUSH
10.0000 mL | Freq: Once | INTRAVENOUS | Status: AC
  Administered 2021-02-25: 10 mL

## 2021-02-26 ENCOUNTER — Other Ambulatory Visit: Payer: Self-pay

## 2021-02-26 ENCOUNTER — Ambulatory Visit: Payer: BC Managed Care – PPO

## 2021-02-26 ENCOUNTER — Ambulatory Visit
Admission: RE | Admit: 2021-02-26 | Discharge: 2021-02-26 | Disposition: A | Discharge status: Home / Self Care | Payer: BC Managed Care – PPO | Source: Ambulatory Visit | Attending: Radiation Oncology | Admitting: Radiation Oncology

## 2021-02-26 DIAGNOSIS — C50411 Malignant neoplasm of upper-outer quadrant of right female breast: Secondary | ICD-10-CM | POA: Diagnosis not present

## 2021-03-01 ENCOUNTER — Ambulatory Visit
Admission: RE | Admit: 2021-03-01 | Discharge: 2021-03-01 | Disposition: A | Discharge status: Home / Self Care | Payer: BC Managed Care – PPO | Source: Ambulatory Visit | Attending: Radiation Oncology | Admitting: Radiation Oncology

## 2021-03-01 DIAGNOSIS — C50411 Malignant neoplasm of upper-outer quadrant of right female breast: Secondary | ICD-10-CM | POA: Diagnosis not present

## 2021-03-02 ENCOUNTER — Ambulatory Visit
Admission: RE | Admit: 2021-03-02 | Discharge: 2021-03-02 | Disposition: A | Discharge status: Home / Self Care | Payer: BC Managed Care – PPO | Source: Ambulatory Visit | Attending: Radiation Oncology | Admitting: Radiation Oncology

## 2021-03-02 ENCOUNTER — Ambulatory Visit: Payer: BC Managed Care – PPO

## 2021-03-02 ENCOUNTER — Other Ambulatory Visit: Payer: Self-pay

## 2021-03-02 DIAGNOSIS — C50411 Malignant neoplasm of upper-outer quadrant of right female breast: Secondary | ICD-10-CM | POA: Diagnosis not present

## 2021-03-03 ENCOUNTER — Ambulatory Visit
Admission: RE | Admit: 2021-03-03 | Discharge: 2021-03-03 | Disposition: A | Discharge status: Home / Self Care | Payer: BC Managed Care – PPO | Source: Ambulatory Visit | Attending: Radiation Oncology | Admitting: Radiation Oncology

## 2021-03-03 ENCOUNTER — Ambulatory Visit: Payer: BC Managed Care – PPO

## 2021-03-03 DIAGNOSIS — C50411 Malignant neoplasm of upper-outer quadrant of right female breast: Secondary | ICD-10-CM | POA: Diagnosis not present

## 2021-03-04 ENCOUNTER — Ambulatory Visit
Admission: RE | Admit: 2021-03-04 | Discharge: 2021-03-04 | Disposition: A | Discharge status: Home / Self Care | Payer: BC Managed Care – PPO | Source: Ambulatory Visit | Attending: Radiation Oncology | Admitting: Radiation Oncology

## 2021-03-04 ENCOUNTER — Ambulatory Visit: Payer: BC Managed Care – PPO

## 2021-03-04 ENCOUNTER — Other Ambulatory Visit: Payer: Self-pay

## 2021-03-04 DIAGNOSIS — C50411 Malignant neoplasm of upper-outer quadrant of right female breast: Secondary | ICD-10-CM | POA: Diagnosis not present

## 2021-03-05 ENCOUNTER — Ambulatory Visit
Admission: RE | Admit: 2021-03-05 | Discharge: 2021-03-05 | Disposition: A | Discharge status: Home / Self Care | Payer: BC Managed Care – PPO | Source: Ambulatory Visit | Attending: Radiation Oncology | Admitting: Radiation Oncology

## 2021-03-05 DIAGNOSIS — C50411 Malignant neoplasm of upper-outer quadrant of right female breast: Secondary | ICD-10-CM | POA: Diagnosis not present

## 2021-03-09 ENCOUNTER — Inpatient Hospital Stay: Payer: BC Managed Care – PPO

## 2021-03-09 ENCOUNTER — Encounter: Payer: Self-pay | Admitting: Hematology and Oncology

## 2021-03-09 ENCOUNTER — Encounter: Payer: Self-pay | Admitting: Radiation Oncology

## 2021-03-09 ENCOUNTER — Other Ambulatory Visit: Payer: Self-pay

## 2021-03-09 ENCOUNTER — Ambulatory Visit
Admission: RE | Admit: 2021-03-09 | Discharge: 2021-03-09 | Disposition: A | Discharge status: Home / Self Care | Payer: BC Managed Care – PPO | Source: Ambulatory Visit | Attending: Radiation Oncology | Admitting: Radiation Oncology

## 2021-03-09 ENCOUNTER — Inpatient Hospital Stay (HOSPITAL_BASED_OUTPATIENT_CLINIC_OR_DEPARTMENT_OTHER): Payer: BC Managed Care – PPO | Admitting: Hematology and Oncology

## 2021-03-09 VITALS — BP 138/99 | HR 100 | Temp 97.7°F | Resp 18 | Ht 70.0 in | Wt 183.1 lb

## 2021-03-09 DIAGNOSIS — K869 Disease of pancreas, unspecified: Secondary | ICD-10-CM | POA: Diagnosis not present

## 2021-03-09 DIAGNOSIS — K831 Obstruction of bile duct: Secondary | ICD-10-CM

## 2021-03-09 DIAGNOSIS — C7951 Secondary malignant neoplasm of bone: Secondary | ICD-10-CM

## 2021-03-09 DIAGNOSIS — Z170 Estrogen receptor positive status [ER+]: Secondary | ICD-10-CM

## 2021-03-09 DIAGNOSIS — C801 Malignant (primary) neoplasm, unspecified: Secondary | ICD-10-CM

## 2021-03-09 DIAGNOSIS — C50411 Malignant neoplasm of upper-outer quadrant of right female breast: Secondary | ICD-10-CM | POA: Diagnosis not present

## 2021-03-09 DIAGNOSIS — Z95828 Presence of other vascular implants and grafts: Secondary | ICD-10-CM

## 2021-03-09 LAB — COMPREHENSIVE METABOLIC PANEL
ALT: 13 U/L (ref 0–44)
AST: 26 U/L (ref 15–41)
Albumin: 3.7 g/dL (ref 3.5–5.0)
Alkaline Phosphatase: 218 U/L — ABNORMAL HIGH (ref 38–126)
Anion gap: 10 (ref 5–15)
BUN: 15 mg/dL (ref 6–20)
CO2: 24 mmol/L (ref 22–32)
Calcium: 10.4 mg/dL — ABNORMAL HIGH (ref 8.9–10.3)
Chloride: 101 mmol/L (ref 98–111)
Creatinine, Ser: 0.8 mg/dL (ref 0.44–1.00)
GFR, Estimated: >60 mL/min (ref >60)
Glucose, Bld: 105 mg/dL — ABNORMAL HIGH (ref 70–99)
Potassium: 3.8 mmol/L (ref 3.5–5.1)
Sodium: 135 mmol/L (ref 135–145)
Total Bilirubin: 1 mg/dL (ref 0.3–1.2)
Total Protein: 7.7 g/dL (ref 6.5–8.1)

## 2021-03-09 LAB — T4, FREE: Free T4: 1.12 ng/dL (ref 0.61–1.12)

## 2021-03-09 LAB — CBC WITH DIFFERENTIAL/PLATELET
Abs Immature Granulocytes: 0.02 10*3/uL (ref 0.00–0.07)
Basophils Absolute: 0 10*3/uL (ref 0.0–0.1)
Basophils Relative: 0 %
Eosinophils Absolute: 0.1 10*3/uL (ref 0.0–0.5)
Eosinophils Relative: 1 %
HCT: 31.7 % — ABNORMAL LOW (ref 36.0–46.0)
Hemoglobin: 10.1 g/dL — ABNORMAL LOW (ref 12.0–15.0)
Immature Granulocytes: 0 %
Lymphocytes Relative: 26 %
Lymphs Abs: 1.8 10*3/uL (ref 0.7–4.0)
MCH: 30.1 pg (ref 26.0–34.0)
MCHC: 31.9 g/dL (ref 30.0–36.0)
MCV: 94.6 fL (ref 80.0–100.0)
Monocytes Absolute: 0.9 10*3/uL (ref 0.1–1.0)
Monocytes Relative: 12 %
Neutro Abs: 4.2 10*3/uL (ref 1.7–7.7)
Neutrophils Relative %: 61 %
Platelets: 220 10*3/uL (ref 150–400)
RBC: 3.35 MIL/uL — ABNORMAL LOW (ref 3.87–5.11)
RDW: 12.6 % (ref 11.5–15.5)
WBC: 7 10*3/uL (ref 4.0–10.5)
nRBC: 0 % (ref 0.0–0.2)

## 2021-03-09 MED ORDER — SODIUM CHLORIDE 0.9% FLUSH
10.0000 mL | INTRAVENOUS | Status: DC | PRN
  Administered 2021-03-09: 11:00:00 10 mL via INTRAVENOUS

## 2021-03-09 MED ORDER — HEPARIN SOD (PORK) LOCK FLUSH 100 UNIT/ML IV SOLN
500.0000 [IU] | Freq: Once | INTRAVENOUS | Status: AC
  Administered 2021-03-09: 11:00:00 500 [IU] via INTRAVENOUS

## 2021-03-09 NOTE — Progress Notes (Signed)
[place]  [phone] [phone]    ID: [name] DOB: [birth year]  MR#: [id number]  EYC#:[id number]  Patient Care Team: [name], MD as PCP - General (Family Medicine) Mauro Kaufmann, RN as Oncology Nurse Navigator Rockwell Germany, RN as Oncology Nurse Navigator Rolm Bookbinder, MD as Consulting Physician (General Surgery) Kyung Rudd, MD as Consulting Physician (Radiation Oncology) Meisinger, Sherren Mocha, MD as Consulting Physician (Obstetrics and Gynecology) Elsie Saas, MD as Consulting Physician (Orthopedic Surgery) Carol Ada, MD as Consulting Physician (Gastroenterology) Festus Aloe, MD as Consulting Physician (Urology) Benay Pike, MD OTHER MD:  CHIEF COMPLAINT: triple negative breast cancer (s/p right mastectomy)  CURRENT TREATMENT: Completing adjuvant radiation   INTERVAL HISTORY:  [name] returns today for follow up and treatment of her 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  She is accompanied by her mother  While [name] was in the hospital, she underwent EUS on 02/09/2021. Brushings from the procedure were submitted for cytology, and the results ([id number]) were read as suspicious for malignancy. She completed post mastectomy radiation today. She has her PET/CT scheduled for 03/11/2021.  Since her last visit, she denies any new complaints, her biliary drain working well.  She does not have any pain, takes an occasional Tylenol when she has back pain.  No change in breathing, no change in urinary habits. Rest of the pertinent 10 point ROS reviewed and negative.  HISTORY OF CURRENT ILLNESS:    Cancer Staging  Malignant neoplasm of upper-outer quadrant of right breast in female, estrogen receptor positive ([name]) Staging form: Breast, AJCC 8th Edition - Pathologic stage from 03/31/2020: Stage IV (cM1) - Signed by Gardenia Phlegm, NP on 05/01/2020 - Clinical stage from 04/08/2020: Stage IIB (cT1c,  cN1(f), cM0, G2, ER-, PR-, HER2-) - Signed by Chauncey Cruel, MD on 04/08/2020 Stage prefix: Initial diagnosis Method of lymph node assessment: Core biopsy  55 y.o. [place] woman status post right breast upper outer quadrant biopsy 03/27/2020 for a clinical T1c N3 M1, stageIV invasive ductal carcinoma, functionally triple negative, with an MIB-1 of 30-40%  (a) chest CT scan 04/20/2020 shows in addition to the breast mass and regional adenopathy, lytic and sclerotic bone changes, possible left thoracic adenopathy, and indeterminant left pleural nodularity  (b) bone scan 04/21/2020 confirms multiple bone lesions  (c) thoracic MRI confirms diffuse bony metastatic disease but shows no epidural soft tissue component and no cord compression  (1) genetics testing 04/24/2020 through the [place] [employer] + RNAinsight panel found no deleterious mutations in APC, ATM, AXIN2, BARD1, BMPR1A, BRCA1, BRCA2, BRIP1, CDH1, CDK4, CDKN2A, CHEK2, DICER1, EPCAM, GREM1, HOXB13, MEN1, MLH1, MSH2, MSH3, MSH6, MUTYH, NBN, NF1, NF2, NTHL1, PALB2, PMS2, POLD1, POLE, PTEN, RAD51C, RAD51D, RECQL, RET, SDHA, SDHAF2, SDHB, SDHC, SDHD, SMAD4, SMARCA4, STK11, TP53, TSC1, TSC2, and VHL.  RNA data is routinely analyzed for use in variant interpretation for all genes.  (2) neoadjuvant chemotherapy consisted of pembrolizumab every 3 weeks x 4 with carboplatin every 3 weeks x 4 with paclitaxel weekly x12, started 04/24/2020, completed 07/10/2020  (a) did not receive pembolizumab, doxorubicin,cyclophosphamide every 3 weeks x 4 but proceeded to observation  (3) zoledronate started 05/08/2018, repeated every 12 weeks  (4) molecular studies:  (A) PT-L1 combined positive score of 5 (on January 2022 specimen)  (B) foundation 1 from the 03/27/2020 sample was microsatellite stable, with low mutational burden (0), and no reportable alterations in BRCA1 or 2, ER BB 2, or PIK 3 CA.  There was REL amplification  and a TP53  mutation.  (5) restaging studies:  (a) post-chemo breast MRI 06/30/2020 shows significant response  (b) CT chest 07/09/2020 shows decreased right axillary adenopathy, no lung or liver lesions, healing of lytic bone lesions  (c) CT chest 10/07/2020 shows progression of the right axillary adenopathy and upper abdominal retroperitoneal adenopathy, with stable scattered sclerotic bone lesions--this led to surgery (see #9 below)  (6) adjuvant pembrolizumab resumed 07/31/2020, discontinued after 10/02/2020 dose with disease progression  (7) anastrozole started 07/26/2020 given (weak) estrogen and progesterone receptor positivity  (A) MRI breast 09/28/2020 shows local progression in the right breast and axilla-- anastrozole discontinued   (8) fulvestrant started 10/08/2020  (A) abemaciclib added 10/24/2018  (B) fulvestrant discontinued after right modified radical mastectomy with repeat prognostic panel showing the tumor to be triple negative  (C) abemaciclib discontinued November 2022 with increased liver function tests  (9) status post right modified radical mastectomy on 12/07/2020 for a residual ympT2 ypN3a invasive ductal carcinoma, e-cadherin positive, grade 3, with negative margins  (a) a total of 13 right axillary lymph nodes removed, 12 positive, with evidence of extracapsular extension; residual cancer burden III  (b) repeat prognostic panel estrogen and progesterone receptor negative, HER2 negative (1+).    (10) adjuvant radiation to be completed 03/09/2021  (11) admitted 02/01/2021 with worsening biliary obstruction:             (A) CT scans of the chest abdomen and pelvis 12/29/2020 show no liver or lung involvement.  There is some left retroperitoneal adenopathy which may be the only site of measurable disease and there are some bone lesions.             (B) right upper quadrant abdominal ultrasound 01/28/2021 shows gallbladder sludge, no liver lesions              (C) CT scan of the  abdomen and pelvis 02/01/2021 shows intrahepatic biliary dilatation, gallbladder wall thickening, narrowing of the hepatic ducts, stranding around the pancreatic head, and new right hydroureter             (D) noncontrast MRI of the abdomen 02/01/2021 shows no liver masses, dilatation of the intrahepatic bile ducts with stricture of the common hepatic duct in the liver hilum, no evidence of cholecystitis, a solid 3.6 cm mass in the pancreatic head consistent with focal pancreatitis, moderate right hydronephrosis             (E) ERCP 02/02/2021--unable to cannulate CBD             (F) external biliary drain placement with cholangiogram 02/02/2021; 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  There was no abnormality noted in the liver.  Brushings from this procedure were read as suspicious for malignancy.    PAST MEDICAL HISTORY: Past Medical History:  Diagnosis Date   Allergy    Arthritis 2018   hands, left knee   Breast cancer ([place]) 03-27-2020   right breast IMC   Eczema    Family history of breast cancer    Family history of lung cancer    Family history of prostate cancer    Hypertension     PAST SURGICAL HISTORY: Past Surgical History:  Procedure Laterality Date   ABDOMINAL HYSTERECTOMY     CESAREAN  SECTION     ERCP N/A 02/02/2021   Procedure: ENDOSCOPIC RETROGRADE CHOLANGIOPANCREATOGRAPHY (ERCP);  Surgeon: Carol Ada, MD;  Location: [facility];  Service: Endoscopy;  Laterality: N/A;   ESOPHAGOGASTRODUODENOSCOPY N/A 02/09/2021   Procedure: ESOPHAGOGASTRODUODENOSCOPY (EGD);  Surgeon: Carol Ada, MD;  Location: [facility];  Service: Endoscopy;  Laterality: N/A;   IR BILIARY DRAIN PLACEMENT WITH CHOLANGIOGRAM  02/03/2021   IR  ENDOLUMINAL BX OF BILIARY TREE  02/10/2021   IR EXCHANGE BILIARY DRAIN  02/10/2021   KNEE ARTHROSCOPY W/ ACL RECONSTRUCTION Left    KNEE SURGERY     MODIFIED MASTECTOMY Right 12/07/2020   Procedure: RIGHT MODIFIED RADICAL MASTECTOMY;  Surgeon: Rolm Bookbinder, MD;  Location: [facility];  Service: General;  Laterality: Right;   PORTACATH PLACEMENT Left 04/23/2020   Procedure: LEFT SIDE PORT PLACEMENT WITH ULTRASOUND GUIDANCE;  Surgeon: Rolm Bookbinder, MD;  Location: [facility];  Service: General;  Laterality: Left;  230PM START TIME PLEASE ROOM 2 THANKS   SPHINCTEROTOMY  02/02/2021   Procedure: SPHINCTEROTOMY;  Surgeon: Carol Ada, MD;  Location: [facility];  Service: Endoscopy;;   TUBAL LIGATION     UPPER ESOPHAGEAL ENDOSCOPIC ULTRASOUND (EUS) N/A 02/09/2021   Procedure: UPPER ESOPHAGEAL ENDOSCOPIC ULTRASOUND (EUS);  Surgeon: Carol Ada, MD;  Location: [facility];  Service: Endoscopy;  Laterality: N/A;    FAMILY HISTORY: Family History  Problem Relation Age of Onset   Breast cancer Mother 28   Arthritis Mother    Diabetes Father    Kidney disease Father    Hypertension Sister    Heart disease Maternal Grandmother    Breast cancer Paternal Grandmother        dx >50   Heart disease Maternal Aunt    Breast cancer Maternal Aunt 77   Other Maternal Aunt        brain tumor (not cancerous)   Lung cancer Maternal Aunt    Prostate cancer Cousin 53       localized   Her parents are both living, her father age 44 and mother age 73, as of 03/2020. Haile has 1 brother and 2 sisters. She reports breast cancer in her mother at age 108, also in her her maternal aunt, and her paternal grandmother. Her maternal aunt also has a history of lung cancer.  The patient also reports prostate cancer in a maternal cousin in his 7's.   GYNECOLOGIC HISTORY:  No LMP recorded. Patient has had a hysterectomy. Menarche: 55 years old Age at first live birth: 55  years old [place] P 2 LMP 2010 with hysterectomy Contraceptive: prior use without issue HRT never used  Hysterectomy? Yes, 2010 for fibroids BSO? 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 She is divorced. She lives at home with son [name], age 58, who works for [employer]. Son Buckner, Brooke Bonito, age 41, works as a Development worker, international aid in [place], [place]. Kember has no grandchildren. She attends [employer].    ADVANCED DIRECTIVES: not in place; considering naming mother Melody Haver, son [name], or son Raynald as healthcare powers of attorney   HEALTH MAINTENANCE: Social History   Tobacco Use   Smoking status: Former    Packs/day: 0.25    Years: 36.00    Pack years: 9.00    Types: Cigarettes    Quit date: 04/22/2017    Years since quitting: 3.8   Smokeless tobacco: Never   Tobacco comments:  She is trying to quit.    Vaping Use   Vaping Use: Never used  Substance Use Topics   Alcohol use: Yes    Alcohol/week: 3.0 standard drinks    Types: 3 Glasses of wine per week    Comment: Occasional    Drug use: Never     Colonoscopy: age 52  PAP: date unsure  Bone density: never done   No Known Allergies  Current Outpatient Medications  Medication Sig Dispense Refill   acetaminophen (TYLENOL) 500 MG tablet Take 1,000 mg by mouth every 6 (six) hours as needed for moderate pain.     Boric Acid Vaginal 600 MG SUPP Place 600 mg vaginally daily as needed (yeast infections).     Cholecalciferol (VITAMIN D) 125 MCG (5000 UT) CAPS Take 5,000 Units by mouth every morning.     clobetasol ointment (TEMOVATE) 3.88 % Apply 1 application topically 2 (two) times daily as needed (rash/itching).     Cyanocobalamin (B-12) 5000 MCG CAPS Take 5,000 mcg by mouth every morning.     loperamide (IMODIUM) 2 MG capsule Take 2 tablets after first diarrheal bowel movement, then one tablet after each following diarrheal  movement; maximum 6 tablets/ day (Patient taking differently: 2-4 mg See admin instructions. Take 2 tablets  (4 mg) by mouth after first diarrheal bowel movement, then one tablet (2 mg) after each following diarrheal movement; maximum 6 tablets/ day) 60 capsule 6   methocarbamol (ROBAXIN) 500 MG tablet Take 1 tablet (500 mg total) by mouth every 6 (six) hours as needed for muscle spasms. 30 tablet 1   neomycin-bacitracin-polymyxin (NEOSPORIN) OINT Apply 1 application topically 2 (two) times daily. 60 g 0   prochlorperazine (COMPAZINE) 10 MG tablet Take 1 tablet (10 mg total) by mouth every 6 (six) hours as needed for nausea or vomiting. 30 tablet 4   propranolol (INDERAL) 10 MG tablet Take 10 mg by mouth 2 (two) times daily.     Sodium Chloride Flush (NORMAL SALINE FLUSH) 0.9 % SOLN Inject 10 mLs into the vein daily. 300 mL 0   spironolactone (ALDACTONE) 25 MG tablet Take 25 mg by mouth 2 (two) times daily.     No current facility-administered medications for this visit.    OBJECTIVE: African-American woman who appears stated age 70:   03/09/21 1112  BP: (!) 138/99  Pulse: 100  Resp: 18  Temp: 97.7 F (36.5 C)  SpO2: 100%       Body mass index is 26.27 kg/m.   Wt Readings from Last 3 Encounters:  03/09/21 183 lb 1.6 oz (83.1 kg)  02/25/21 194 lb 2 oz (88.1 kg)  02/02/21 201 lb (91.2 kg)     ECOG FS:1 - Symptomatic but completely ambulatory  Sclerae unicteric, EOMs intact Wearing a mask No cervical or supraclavicular adenopathy Lungs no rales or rhonchi Heart regular rate and rhythm Abd soft, nontender, positive bowel sounds MSK no focal spinal tenderness, no right upper extremity lymphedema Neuro: nonfocal, well oriented, positive affect Breasts: The right breast is status postmastectomy with associated axillary lymph node dissection.  There is no evidence of chest wall recurrence.  This area is currently receiving radiation.  There is significant hyperpigmentation mild dry  desquamation.  The left breast and left axilla are benign.  LAB RESULTS:  CMP     Component Value Date/Time   NA 138 02/25/2021 1103   K 4.1 02/25/2021 1103   CL 106 02/25/2021 1103   CO2 23 02/25/2021 1103  GLUCOSE 100 (H) 02/25/2021 1103   BUN 13 02/25/2021 1103   CREATININE 0.89 02/25/2021 1103   CREATININE 0.93 12/31/2020 1554   CREATININE 0.69 09/24/2013 0819   CALCIUM 9.6 02/25/2021 1103   PROT 7.2 02/25/2021 1103   ALBUMIN 2.6 (L) 02/25/2021 1103   AST 29 02/25/2021 1103   AST 16 12/31/2020 1554   ALT 20 02/25/2021 1103   ALT 11 12/31/2020 1554   ALKPHOS 212 (H) 02/25/2021 1103   BILITOT 1.5 (H) 02/25/2021 1103   BILITOT 0.4 12/31/2020 1554   GFRNONAA >60 02/25/2021 1103   GFRNONAA >60 12/31/2020 1554   GFRNONAA >89 09/24/2013 0819   GFRAA >89 09/24/2013 0819    No results found for: TOTALPROTELP, ALBUMINELP, A1GS, A2GS, BETS, BETA2SER, GAMS, MSPIKE, SPEI  Lab Results  Component Value Date   WBC 7.0 03/09/2021   NEUTROABS 4.2 03/09/2021   HGB 10.1 (L) 03/09/2021   HCT 31.7 (L) 03/09/2021   MCV 94.6 03/09/2021   PLT 220 03/09/2021    No results found for: LABCA2  No components found for: [id number]  No results for input(s): INR in the last 168 hours.   No results found for: LABCA2  No results found for: WSF681  No results found for: EXN170  No results found for: YFV494  No results found for: CA2729  No components found for: HGQUANT  No results found for: CEA1 / No results found for: CEA1   No results found for: AFPTUMOR  No results found for: CHROMOGRNA  No results found for: KPAFRELGTCHN, LAMBDASER, KAPLAMBRATIO (kappa/lambda light chains)  No results found for: HGBA, HGBA2QUANT, HGBFQUANT, HGBSQUAN (Hemoglobinopathy evaluation)   No results found for: LDH  No results found for: IRON, TIBC, IRONPCTSAT (Iron and TIBC)  No results found for: FERRITIN  Urinalysis    Component Value Date/Time   COLORURINE AMBER (A) 02/01/2021  [place] 02/01/2021 1658   LABSPEC 1.033 (H) 02/01/2021 1658   PHURINE 5.0 02/01/2021 1658   GLUCOSEU NEGATIVE 02/01/2021 1658   HGBUR NEGATIVE 02/01/2021 1658   BILIRUBINUR NEGATIVE 02/01/2021 1658   KETONESUR NEGATIVE 02/01/2021 1658   PROTEINUR NEGATIVE 02/01/2021 1658   NITRITE NEGATIVE 02/01/2021 1658   LEUKOCYTESUR NEGATIVE 02/01/2021 1658    STUDIES: IR EXCHANGE BILIARY DRAIN  Result Date: 02/11/2021 INDICATION: 55 year old female with history of metastatic breast cancer and biliary obstruction status post left percutaneous transhepatic, internal external biliary drain placement on 02/03/2021. EXAM: 1. Cholangiogram. 2. Biliary drain exchange. 3. Biliary brush biopsy. MEDICATIONS: Rocephin 2 gm IV; The antibiotic was administered within an appropriate time frame prior to the initiation of the procedure. ANESTHESIA/SEDATION: Moderate (conscious) sedation was employed during this procedure. A total of Versed 2 mg and Fentanyl 100 mcg was administered intravenously. Moderate Sedation Time: 19 minutes. The patient's level of consciousness and vital signs were monitored continuously by radiology nursing throughout the procedure under my direct supervision. FLUOROSCOPY TIME:  Fluoroscopy Time: 3 minutes 24 seconds (34 mGy). COMPLICATIONS: None immediate. PROCEDURE: Informed written consent was obtained from the patient after a thorough discussion of the procedural risks, benefits and alternatives. All questions were addressed. Maximal Sterile Barrier Technique was utilized including caps, mask, sterile gowns, sterile gloves, sterile drape, hand hygiene and skin antiseptic. A timeout was performed prior to the initiation of the procedure. The upper abdomen and indwelling left-sided biliary drain were prepped and draped in standard fashion. Preprocedure scout radiograph demonstrated unchanged position of the indwelling biliary drain. Gentle hand injection demonstrated no significant  intrahepatic  biliary ductal dilation. Subdermal Local anesthesia was administered around the indwelling biliary drain. External suture was cut. The external portion of the tube was cut to release the pigtail and an Amplatz wire was inserted with its tip in the distal duodenum. The tube was removed over the wire. An 8 [place], 25 cm Brite tip sheath was then inserted with the distal tip in the duodenum. The Amplatz wire was exchanged for a 018 glidewire. Next, 3 passes were made near the biliary confluence and superior aspect of the common bile duct with separate brush biopsy sets. The brushes were cut in placed in saline and sent to pathology. The sheath was then readvanced into the small bowel and the Glidewire was exchanged for an Amplatz wire. The sheath was then removed and a new, 10.2 [place] biliary drain was placed with the pigtail portion coiled in the duodenum and the radiopaque marker in a peripheral left-sided bile duct. Gentle hand injection confirmed location and patency with brisk antegrade flow into the duodenum. The drain was locked and reinforced at the skin entry site with a silk suture. A cap was placed on the drain sterile bandage was applied. The patient tolerated procedure well was transferred back to the floor in good condition. IMPRESSION: 1. Technically successful brush biopsy of the hilar confluence and superior aspect of the common bile duct. 2. Technically successful biliary drain exchange for a new 10.2 [place] biliary drain. The drain was capped. PLAN: Keep the drain capped and internalized unless the patient develops symptoms of recurrent biliary obstruction, in which case the drain should be placed back to bag drainage. Plan follow-up in Interventional Radiology in 8 weeks for biliary drain check and change, or sooner if desired for internal biliary stent placement and removal of percutaneous drain. Ruthann Cancer, MD [facility] Radiology  Electronically Signed   By: Ruthann Cancer M.D.   On: 02/11/2021 13:33   IR ENDOLUMINAL BX OF BILIARY TREE  Result Date: 02/11/2021 INDICATION: 55 year old female with history of metastatic breast cancer and biliary obstruction status post left percutaneous transhepatic, internal external biliary drain placement on 02/03/2021. EXAM: 1. Cholangiogram. 2. Biliary drain exchange. 3. Biliary brush biopsy. MEDICATIONS: Rocephin 2 gm IV; The antibiotic was administered within an appropriate time frame prior to the initiation of the procedure. ANESTHESIA/SEDATION: Moderate (conscious) sedation was employed during this procedure. A total of Versed 2 mg and Fentanyl 100 mcg was administered intravenously. Moderate Sedation Time: 19 minutes. The patient's level of consciousness and vital signs were monitored continuously by radiology nursing throughout the procedure under my direct supervision. FLUOROSCOPY TIME:  Fluoroscopy Time: 3 minutes 24 seconds (34 mGy). COMPLICATIONS: None immediate. PROCEDURE: Informed written consent was obtained from the patient after a thorough discussion of the procedural risks, benefits and alternatives. All questions were addressed. Maximal Sterile Barrier Technique was utilized including caps, mask, sterile gowns, sterile gloves, sterile drape, hand hygiene and skin antiseptic. A timeout was performed prior to the initiation of the procedure. The upper abdomen and indwelling left-sided biliary drain were prepped and draped in standard fashion. Preprocedure scout radiograph demonstrated unchanged position of the indwelling biliary drain. Gentle hand injection demonstrated no significant intrahepatic biliary ductal dilation. Subdermal Local anesthesia was administered around the indwelling biliary drain. External suture was cut. The external portion of the tube was cut to release the pigtail and an Amplatz wire was inserted with its tip in the distal duodenum. The tube was removed over the  wire. An 8  [place], 25 cm Brite tip sheath was then inserted with the distal tip in the duodenum. The Amplatz wire was exchanged for a 018 glidewire. Next, 3 passes were made near the biliary confluence and superior aspect of the common bile duct with separate brush biopsy sets. The brushes were cut in placed in saline and sent to pathology. The sheath was then readvanced into the small bowel and the Glidewire was exchanged for an Amplatz wire. The sheath was then removed and a new, 10.2 [place] biliary drain was placed with the pigtail portion coiled in the duodenum and the radiopaque marker in a peripheral left-sided bile duct. Gentle hand injection confirmed location and patency with brisk antegrade flow into the duodenum. The drain was locked and reinforced at the skin entry site with a silk suture. A cap was placed on the drain sterile bandage was applied. The patient tolerated procedure well was transferred back to the floor in good condition. IMPRESSION: 1. Technically successful brush biopsy of the hilar confluence and superior aspect of the common bile duct. 2. Technically successful biliary drain exchange for a new 10.2 [place] biliary drain. The drain was capped. PLAN: Keep the drain capped and internalized unless the patient develops symptoms of recurrent biliary obstruction, in which case the drain should be placed back to bag drainage. Plan follow-up in Interventional Radiology in 8 weeks for biliary drain check and change, or sooner if desired for internal biliary stent placement and removal of percutaneous drain. Ruthann Cancer, MD [facility] Radiology Electronically Signed   By: Ruthann Cancer M.D.   On: 02/11/2021 13:33     ELIGIBLE FOR AVAILABLE RESEARCH PROTOCOL: no  ASSESSMENT: 55 y.o. [place] woman status post right breast upper outer quadrant biopsy 03/27/2020 for a clinical T1c N3 M1, stageIV invasive ductal carcinoma, functionally triple  negative, with an MIB-1 of 30-40%  (a) chest CT scan 04/20/2020 shows in addition to the breast mass and regional adenopathy, lytic and sclerotic bone changes, possible left thoracic adenopathy, and indeterminant left pleural nodularity  (b) bone scan 04/21/2020 confirms multiple bone lesions  (c) thoracic MRI confirms diffuse bony metastatic disease but shows no epidural soft tissue component and no cord compression  (1) genetics testing 04/24/2020 through the [facility] [employer] + RNAinsight panel found no deleterious mutations in APC, ATM, AXIN2, BARD1, BMPR1A, BRCA1, BRCA2, BRIP1, CDH1, CDK4, CDKN2A, CHEK2, DICER1, EPCAM, GREM1, HOXB13, MEN1, MLH1, MSH2, MSH3, MSH6, MUTYH, NBN, NF1, NF2, NTHL1, PALB2, PMS2, POLD1, POLE, PTEN, RAD51C, RAD51D, RECQL, RET, SDHA, SDHAF2, SDHB, SDHC, SDHD, SMAD4, SMARCA4, STK11, TP53, TSC1, TSC2, and VHL.  RNA data is routinely analyzed for use in variant interpretation for all genes.  (2) neoadjuvant chemotherapy consisted of pembrolizumab every 3 weeks x 4 with carboplatin every 3 weeks x 4 with paclitaxel weekly x12, started 04/24/2020, completed 07/10/2020  (a) did not receive pembolizumab, doxorubicin,cyclophosphamide every 3 weeks x 4 but proceeded to observation  (3) zoledronate started 05/08/2018, repeated every 12 weeks  (4) molecular studies:  (A) PT-L1 combined positive score of 5 (on January 2022 specimen)  (B) foundation 1 from the 03/27/2020 sample was microsatellite stable, with low mutational burden (0), and no reportable alterations in BRCA1 or 2, ER BB 2, or PIK 3 CA.  There was REL amplification and a TP53 mutation.  (5) restaging studies:  (a) post-chemo breast MRI 06/30/2020 shows significant response  (b) CT chest 07/09/2020 shows decreased right axillary adenopathy, no lung or liver lesions, healing of lytic bone  lesions  (c) CT chest 10/07/2020 shows progression of the right axillary adenopathy and upper abdominal retroperitoneal  adenopathy, with stable scattered sclerotic bone lesions--this led to surgery (see #9 below)  (6) adjuvant pembrolizumab resumed 07/31/2020, discontinued after 10/02/2020 dose with disease progression  (7) anastrozole started 07/26/2020 given (weak) estrogen and progesterone receptor positivity  (A) MRI breast 09/28/2020 shows local progression in the right breast and axilla-- anastrozole discontinued   (8) fulvestrant started 10/08/2020  (A) abemaciclib added 10/24/2018  (B) fulvestrant discontinued after right modified radical mastectomy with repeat prognostic panel showing the tumor to be triple negative  (C) abemaciclib discontinued November 2022 with increased liver function tests  (9) status post right modified radical mastectomy on 12/07/2020 for a residual ympT2 ypN3a invasive ductal carcinoma, e-cadherin positive, grade 3, with negative margins  (a) a total of 13 right axillary lymph nodes removed, 12 positive, with evidence of extracapsular extension; residual cancer burden III  (b) repeat prognostic panel estrogen and progesterone receptor negative, HER2 negative (1+).    (10) adjuvant radiation to be completed 03/09/2021  (11) admitted 02/01/2021 with worsening biliary obstruction:             (A) CT scans of the chest abdomen and pelvis 12/29/2020 show no liver or lung involvement.  There is some left retroperitoneal adenopathy which may be the only site of measurable disease and there are some bone lesions.             (B) right upper quadrant abdominal ultrasound 01/28/2021 shows gallbladder sludge, no liver lesions              (C) CT scan of the abdomen and pelvis 02/01/2021 shows intrahepatic biliary dilatation, gallbladder wall thickening, narrowing of the hepatic ducts, stranding around the pancreatic head, and new right hydroureter             (D) noncontrast MRI of the abdomen 02/01/2021 shows no liver masses, dilatation of the intrahepatic bile ducts with stricture of the  common hepatic duct in the liver hilum, no evidence of cholecystitis, a solid 3.6 cm mass in the pancreatic head consistent with focal pancreatitis, moderate right hydronephrosis             (E) ERCP 02/02/2021--unable to cannulate CBD             (F) external biliary drain placement with cholangiogram 02/02/2021; 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  There was no abnormality noted in the liver.  Brushings from this procedure were read as suspicious for malignancy    PLAN:  Shelene is coming up in a year from initial diagnosis of metastatic breast cancer.  Her disease has been very difficult to evaluate.  She had apparent pancreatitis or abdominal inflammation or problems with her gallbladder and biliary obstruction but there is no obviously obstructing mass and we have not been able to document cancer in those areas to cause those problems.  She has been scheduled for PET/CT on 03/11/2021 which hopefully will shed more light on the status of metastatic disease. Given her last pathology and demonstrating ER/PR and HER2 negative breast cancer, if she were to indeed show a progressive metastatic disease on PET imaging, we might consider chemotherapy with xeloda.  In the past when she had  foundation 1 testing, she had MSS with low mutational burden and no reportable alterations.  She is doing a good job at managing her external biliary drain.  Total encounter time 40 minutes.*  I spent 40 minutes in the care of this patient including reviewing her history, previous medical records, counseling about future treatment options and follow-up recommendations   *Total Encounter Time as defined by the Centers for Medicare and Medicaid Services includes,  in addition to the face-to-face time of a patient visit (documented in the note above) non-face-to-face time: obtaining and reviewing outside history, ordering and reviewing medications, tests or procedures, care coordination (communications with other health care professionals or caregivers) and documentation in the medical record.

## 2021-03-11 ENCOUNTER — Other Ambulatory Visit: Payer: Self-pay

## 2021-03-11 ENCOUNTER — Ambulatory Visit (HOSPITAL_COMMUNITY)
Admission: RE | Admit: 2021-03-11 | Discharge: 2021-03-11 | Disposition: A | Discharge status: Home / Self Care | Payer: BC Managed Care – PPO | Source: Ambulatory Visit | Attending: Oncology | Admitting: Oncology

## 2021-03-11 DIAGNOSIS — C50411 Malignant neoplasm of upper-outer quadrant of right female breast: Secondary | ICD-10-CM

## 2021-03-11 DIAGNOSIS — C801 Malignant (primary) neoplasm, unspecified: Secondary | ICD-10-CM | POA: Diagnosis present

## 2021-03-11 DIAGNOSIS — Z170 Estrogen receptor positive status [ER+]: Secondary | ICD-10-CM | POA: Diagnosis present

## 2021-03-11 DIAGNOSIS — K831 Obstruction of bile duct: Secondary | ICD-10-CM | POA: Insufficient documentation

## 2021-03-11 DIAGNOSIS — K869 Disease of pancreas, unspecified: Secondary | ICD-10-CM | POA: Diagnosis present

## 2021-03-11 DIAGNOSIS — C7951 Secondary malignant neoplasm of bone: Secondary | ICD-10-CM

## 2021-03-11 DIAGNOSIS — Z9011 Acquired absence of right breast and nipple: Secondary | ICD-10-CM

## 2021-03-11 LAB — GLUCOSE, CAPILLARY
Glucose-Capillary: 101 mg/dL — ABNORMAL HIGH (ref 70–99)
Glucose-Capillary: 41 mg/dL — CL (ref 70–99)

## 2021-03-11 MED ORDER — FLUDEOXYGLUCOSE F - 18 (FDG) INJECTION
9.1000 | Freq: Once | INTRAVENOUS | Status: AC
  Administered 2021-03-11: 08:00:00 9.1 via INTRAVENOUS

## 2021-03-23 ENCOUNTER — Inpatient Hospital Stay: Payer: BC Managed Care – PPO | Attending: Oncology | Admitting: Hematology and Oncology

## 2021-03-23 ENCOUNTER — Other Ambulatory Visit: Payer: Self-pay

## 2021-03-23 ENCOUNTER — Inpatient Hospital Stay: Payer: BC Managed Care – PPO

## 2021-03-23 ENCOUNTER — Other Ambulatory Visit: Payer: Self-pay | Admitting: *Deleted

## 2021-03-23 ENCOUNTER — Encounter: Payer: Self-pay | Admitting: Hematology and Oncology

## 2021-03-23 ENCOUNTER — Other Ambulatory Visit: Payer: Self-pay | Admitting: Adult Health

## 2021-03-23 VITALS — BP 116/85 | HR 135 | Temp 97.0°F | Resp 20 | Ht 70.0 in | Wt 173.6 lb

## 2021-03-23 VITALS — BP 128/96 | HR 128 | Temp 97.0°F | Resp 19

## 2021-03-23 DIAGNOSIS — R17 Unspecified jaundice: Secondary | ICD-10-CM

## 2021-03-23 DIAGNOSIS — G893 Neoplasm related pain (acute) (chronic): Secondary | ICD-10-CM | POA: Insufficient documentation

## 2021-03-23 DIAGNOSIS — C801 Malignant (primary) neoplasm, unspecified: Secondary | ICD-10-CM

## 2021-03-23 DIAGNOSIS — C786 Secondary malignant neoplasm of retroperitoneum and peritoneum: Secondary | ICD-10-CM | POA: Diagnosis not present

## 2021-03-23 DIAGNOSIS — C7951 Secondary malignant neoplasm of bone: Secondary | ICD-10-CM | POA: Insufficient documentation

## 2021-03-23 DIAGNOSIS — Z803 Family history of malignant neoplasm of breast: Secondary | ICD-10-CM

## 2021-03-23 DIAGNOSIS — Z9011 Acquired absence of right breast and nipple: Secondary | ICD-10-CM | POA: Insufficient documentation

## 2021-03-23 DIAGNOSIS — Z923 Personal history of irradiation: Secondary | ICD-10-CM | POA: Diagnosis not present

## 2021-03-23 DIAGNOSIS — Z801 Family history of malignant neoplasm of trachea, bronchus and lung: Secondary | ICD-10-CM

## 2021-03-23 DIAGNOSIS — N760 Acute vaginitis: Secondary | ICD-10-CM

## 2021-03-23 DIAGNOSIS — Z171 Estrogen receptor negative status [ER-]: Secondary | ICD-10-CM | POA: Insufficient documentation

## 2021-03-23 DIAGNOSIS — B9689 Other specified bacterial agents as the cause of diseases classified elsewhere: Secondary | ICD-10-CM

## 2021-03-23 DIAGNOSIS — Z95828 Presence of other vascular implants and grafts: Secondary | ICD-10-CM

## 2021-03-23 DIAGNOSIS — Z170 Estrogen receptor positive status [ER+]: Secondary | ICD-10-CM

## 2021-03-23 DIAGNOSIS — I10 Essential (primary) hypertension: Secondary | ICD-10-CM | POA: Insufficient documentation

## 2021-03-23 DIAGNOSIS — Z8042 Family history of malignant neoplasm of prostate: Secondary | ICD-10-CM

## 2021-03-23 DIAGNOSIS — Z79899 Other long term (current) drug therapy: Secondary | ICD-10-CM | POA: Insufficient documentation

## 2021-03-23 DIAGNOSIS — K831 Obstruction of bile duct: Secondary | ICD-10-CM

## 2021-03-23 DIAGNOSIS — F17200 Nicotine dependence, unspecified, uncomplicated: Secondary | ICD-10-CM

## 2021-03-23 DIAGNOSIS — C50411 Malignant neoplasm of upper-outer quadrant of right female breast: Secondary | ICD-10-CM | POA: Diagnosis present

## 2021-03-23 DIAGNOSIS — Z1379 Encounter for other screening for genetic and chromosomal anomalies: Secondary | ICD-10-CM

## 2021-03-23 DIAGNOSIS — K869 Disease of pancreas, unspecified: Secondary | ICD-10-CM

## 2021-03-23 MED ORDER — MORPHINE SULFATE 2 MG/ML IJ SOLN
2.0000 mg | Freq: Once | INTRAMUSCULAR | Status: AC
  Filled 2021-03-23: qty 1

## 2021-03-23 MED ORDER — SODIUM CHLORIDE 0.9 % IV SOLN: INTRAVENOUS | Status: AC

## 2021-03-23 MED ORDER — CAPECITABINE 500 MG PO TABS: 1000.0000 mg/m2 | ORAL_TABLET | Freq: Two times a day (BID) | ORAL | 4 refills | Status: DC

## 2021-03-23 MED ORDER — MORPHINE SULFATE (PF) 2 MG/ML IV SOLN
INTRAVENOUS | Status: AC
  Administered 2021-03-23: 2 mg via INTRAVENOUS
  Filled 2021-03-23: qty 1

## 2021-03-23 MED ORDER — MORPHINE SULFATE 2 MG/ML IJ SOLN: 2.0000 mg | Freq: Once | INTRAMUSCULAR | Status: DC

## 2021-03-23 MED ORDER — PROCHLORPERAZINE MALEATE 10 MG PO TABS: 10.0000 mg | ORAL_TABLET | Freq: Four times a day (QID) | ORAL | 1 refills | Status: AC | PRN

## 2021-03-23 MED ORDER — OXYCODONE HCL 5 MG PO TABS: 5.0000 mg | ORAL_TABLET | ORAL | 0 refills | Status: AC | PRN

## 2021-03-23 MED ORDER — ONDANSETRON HCL 8 MG PO TABS: 8.0000 mg | ORAL_TABLET | Freq: Two times a day (BID) | ORAL | 1 refills | Status: AC | PRN

## 2021-03-23 MED ORDER — SODIUM CHLORIDE 0.9% FLUSH
10.0000 mL | Freq: Once | INTRAVENOUS | Status: AC
  Administered 2021-03-23: 10 mL

## 2021-03-23 MED ORDER — HEPARIN SOD (PORK) LOCK FLUSH 100 UNIT/ML IV SOLN
500.0000 [IU] | Freq: Once | INTRAVENOUS | Status: AC
  Administered 2021-03-23: 500 [IU]

## 2021-03-23 MED ORDER — ZOLEDRONIC ACID 4 MG/100ML IV SOLN
4.0000 mg | Freq: Once | INTRAVENOUS | Status: AC
  Administered 2021-03-23: 4 mg via INTRAVENOUS
  Filled 2021-03-23: qty 100

## 2021-03-23 NOTE — Progress Notes (Signed)
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  Intent: Curative  Radiation Treatment Dates: 01/14/2021 through 03/09/2021 Site Technique Total Dose (Gy) Dose per Fx (Gy) Completed Fx Beam Energies  Chest Wall, Right: CW_R 3D 50.4/50.4 1.8 28/28 6X, 10X  Chest Wall, Right: CW_R_SCLV 3D 50.4/50.4 1.8 28/28 6X, 10X  Chest Wall, Right: CW_R_Bst Electron 10/10 2 5/5 9E   Narrative: The patient tolerated radiation therapy relatively well. She developed fatigue and anticipated skin changes in the treatment field.   Plan: The patient will receive a call in about one month from the radiation oncology department. She will continue follow up with Dr. Chryl Heck as well.   ________________________________________________    Carola Rhine, Medical [facility]

## 2021-03-23 NOTE — Patient Instructions (Addendum)
Zoledronic Acid Injection (Hypercalcemia, Oncology) What is this medication? ZOLEDRONIC ACID (ZOE le dron ik AS id) slows calcium loss from bones. It high calcium levels in the blood from some kinds of cancer. It may be used in other people at risk for bone loss. This medicine may be used for other purposes; ask your health care provider or pharmacist if you have questions. COMMON BRAND NAME(S): Zometa What should I tell my care team before I take this medication? They need to know if you have any of these conditions: cancer dehydration dental disease kidney disease liver disease low levels of calcium in the blood lung or breathing disease (asthma) receiving steroids like dexamethasone or prednisone an unusual or allergic reaction to zoledronic acid, other medicines, foods, dyes, or preservatives pregnant or trying to get pregnant breast-feeding How should I use this medication? This drug is injected into a vein. It is given by a health care provider in a hospital or clinic setting. Talk to your health care provider about the use of this drug in children. Special care may be needed. Overdosage: If you think you have taken too much of this medicine contact a poison control center or emergency room at once. NOTE: This medicine is only for you. Do not share this medicine with others. What if I miss a dose? Keep appointments for follow-up doses. It is important not to miss your dose. Call your health care provider if you are unable to keep an appointment. What may interact with this medication? certain antibiotics given by injection NSAIDs, medicines for pain and inflammation, like ibuprofen or naproxen some diuretics like bumetanide, furosemide teriparatide thalidomide This list may not describe all possible interactions. Give your health care provider a list of all the medicines, herbs, non-prescription drugs, or dietary supplements you use. Also tell them if you smoke, drink alcohol, or  use illegal drugs. Some items may interact with your medicine. What should I watch for while using this medication? Visit your health care provider for regular checks on your progress. It may be some time before you see the benefit from this drug. Some people who take this drug have severe bone, joint, or muscle pain. This drug may also increase your risk for jaw problems or a broken thigh bone. Tell your health care provider right away if you have severe pain in your jaw, bones, joints, or muscles. Tell you health care provider if you have any pain that does not go away or that gets worse. Tell your dentist and dental surgeon that you are taking this drug. You should not have major dental surgery while on this drug. See your dentist to have a dental exam and fix any dental problems before starting this drug. Take good care of your teeth while on this drug. Make sure you see your dentist for regular follow-up appointments. You should make sure you get enough calcium and vitamin D while you are taking this drug. Discuss the foods you eat and the vitamins you take with your health care provider. Check with your health care provider if you have severe diarrhea, nausea, and vomiting, or if you sweat a lot. The loss of too much body fluid may make it dangerous for you to take this drug. You may need blood work done while you are taking this drug. Do not become pregnant while taking this drug. Women should inform their health care provider if they wish to become pregnant or think they might be pregnant. There is potential for serious  harm to an unborn child. Talk to your health care provider for more information. What side effects may I notice from receiving this medication? Side effects that you should report to your doctor or health care provider as soon as possible: allergic reactions (skin rash, itching or hives; swelling of the face, lips, or tongue) bone pain infection (fever, chills, cough, sore  throat, pain or trouble passing urine) jaw pain, especially after dental work joint pain kidney injury (trouble passing urine or change in the amount of urine) low blood pressure (dizziness; feeling faint or lightheaded, falls; unusually weak or tired) low calcium levels (fast heartbeat; muscle cramps or pain; pain, tingling, or numbness in the hands or feet; seizures) low magnesium levels (fast, irregular heartbeat; muscle cramp or pain; muscle weakness; tremors; seizures) low red blood cell counts (trouble breathing; feeling faint; lightheaded, falls; unusually weak or tired) muscle pain redness, blistering, peeling, or loosening of the skin, including inside the mouth severe diarrhea swelling of the ankles, feet, hands trouble breathing Side effects that usually do not require medical attention (report to your doctor or health care provider if they continue or are bothersome): anxious constipation coughing depressed mood eye irritation, itching, or pain fever general ill feeling or flu-like symptoms nausea pain, redness, or irritation at site where injected trouble sleeping This list may not describe all possible side effects. Call your doctor for medical advice about side effects. You may report side effects to FDA at 1-800-FDA-1088. Where should I keep my medication? This drug is given in a hospital or clinic. It will not be stored at home. NOTE: This sheet is a summary. It may not cover all possible information. If you have questions about this medicine, talk to your doctor, pharmacist, or health care provider.  2022 Elsevier/Gold Standard (2020-11-17 00:00:00)  Rehydration, Adult Rehydration is the replacement of body fluids, salts, and minerals (electrolytes) that are lost during dehydration. Dehydration is when there is not enough water or other fluids in the body. This happens when you lose more fluids than you take in. Common causes of dehydration include: Not drinking  enough fluids. This can occur when you are ill or doing activities that require a lot of energy, especially in hot weather. Conditions that cause loss of water or other fluids, such as diarrhea, vomiting, sweating, or urinating a lot. Other illnesses, such as fever or infection. Certain medicines, such as those that remove excess fluid from the body (diuretics). Symptoms of mild or moderate dehydration may include thirst, dry lips and mouth, and dizziness. Symptoms of severe dehydration may include increased heart rate, confusion, fainting, and not urinating. For severe dehydration, you may need to get fluids through an IV at the hospital. For mild or moderate dehydration, you can usually rehydrate at home by drinking certain fluids as told by your health care provider. What are the risks? Generally, rehydration is safe. However, taking in too much fluid (overhydration) can be a problem. This is rare. Overhydration can cause an electrolyte imbalance, kidney failure, or a decrease in salt (sodium) levels in the body. Supplies needed You will need an oral rehydration solution (ORS) if your health care provider tells you to use one. This is a drink to treat dehydration. It can be found in pharmacies and retail stores. How to rehydrate Fluids Follow instructions from your health care provider for rehydration. The kind of fluid and the amount you should drink depend on your condition. In general, you should choose drinks that you prefer. If  told by your health care provider, drink an ORS. Make an ORS by following instructions on the package. Start by drinking small amounts, about  cup (120 mL) every 5-10 minutes. Slowly increase how much you drink until you have taken the amount recommended by your health care provider. Drink enough clear fluids to keep your urine pale yellow. If you were told to drink an ORS, finish it first, then start slowly drinking other clear fluids. Drink fluids such as: Water.  This includes sparkling water and flavored water. Drinking only water can lead to having too little sodium in your body (hyponatremia). Follow the advice of your health care provider. Water from ice chips you suck on. Fruit juice with water you add to it (diluted). Sports drinks. Hot or cold herbal teas. Broth-based soups. Milk or milk products. Food Follow instructions from your health care provider about what to eat while you rehydrate. Your health care provider may recommend that you slowly begin eating regular foods in small amounts. Eat foods that contain a healthy balance of electrolytes, such as bananas, oranges, potatoes, tomatoes, and spinach. Avoid foods that are greasy or contain a lot of sugar. In some cases, you may get nutrition through a feeding tube that is passed through your nose and into your stomach (nasogastric tube, or NG tube). This may be done if you have uncontrolled vomiting or diarrhea. Beverages to avoid Certain beverages may make dehydration worse. While you rehydrate, avoid drinking alcohol. How to tell if you are recovering from dehydration You may be recovering from dehydration if: You are urinating more often than before you started rehydrating. Your urine is pale yellow. Your energy level improves. You vomit less frequently. You have diarrhea less frequently. Your appetite improves or returns to normal. You feel less dizzy or less light-headed. Your skin tone and color start to look more normal. Follow these instructions at home: Take over-the-counter and prescription medicines only as told by your health care provider. Do not take sodium tablets. Doing this can lead to having too much sodium in your body (hypernatremia). Contact a health care provider if: You continue to have symptoms of mild or moderate dehydration, such as: Thirst. Dry lips. Slightly dry mouth. Dizziness. Dark urine or less urine than normal. Muscle cramps. You continue to vomit  or have diarrhea. Get help right away if you: Have symptoms of dehydration that get worse. Have a fever. Have a severe headache. Have been vomiting and the following happens: Your vomiting gets worse or does not go away. Your vomit includes blood or green matter (bile). You cannot eat or drink without vomiting. Have problems with urination or bowel movements, such as: Diarrhea that gets worse or does not go away. Blood in your stool (feces). This may cause stool to look black and tarry. Not urinating, or urinating only a small amount of very dark urine, within 6-8 hours. Have trouble breathing. Have symptoms that get worse with treatment. These symptoms may represent a serious problem that is an emergency. Do not wait to see if the symptoms will go away. Get medical help right away. Call your local emergency services (911 in the [place] Do not drive yourself to the hospital. Summary Rehydration is the replacement of body fluids and minerals (electrolytes) that are lost during dehydration. Follow instructions from your health care provider for rehydration. The kind of fluid and amount you should drink depend on your condition. Slowly increase how much you drink until you have taken the amount recommended  by your health care provider. Contact your health care provider if you continue to show signs of mild or moderate dehydration. This information is not intended to replace advice given to you by your health care provider. Make sure you discuss any questions you have with your health care provider. Document Revised: 05/01/2019 Document Reviewed: 03/11/2019 Elsevier Patient Education  2022 [employer].

## 2021-03-23 NOTE — Progress Notes (Signed)
Sending in Oxycodone 5mg  PO Q6H PRN #60, due to metastatic cancer related pain.  Sending on behalf of Dr. Chryl Heck as she does not have current Imprav[employer] access.  PMP aware reviewed, no red flags.    Wilber Bihari, NP 03/23/21 2:30 PM Medical Oncology and Hematology [facility] [place], [place] [address] Tel. [phone]    Fax. [phone]

## 2021-03-23 NOTE — Progress Notes (Signed)
DISCONTINUE ON PATHWAY REGIMEN - Breast     Cycles 1 through 4: A cycle is every 21 days:     Pembrolizumab      Paclitaxel      Carboplatin      Filgrastim-xxxx    Cycles 5 through 8: A cycle is every 21 days:     Pembrolizumab      Doxorubicin      Cyclophosphamide      [id number]-xxxx   **Always confirm dose/schedule in your pharmacy ordering system**  REASON: Disease Progression PRIOR TREATMENT: [id number]: Pembrolizumab 200 mg D1 + Paclitaxel 80 mg/m2 D1, 8, 15 + Carboplatin AUC=5 D1 q21 Days x 12 Weeks, Followed by Pembrolizumab 200 mg + Doxorubicin + Cyclophosphamide q21 Days x 12 Weeks, Followed by Surgery TREATMENT RESPONSE: Complete Response (CR)  START ON PATHWAY REGIMEN - Breast     A cycle is every 21 days:     Capecitabine   **Always confirm dose/schedule in your pharmacy ordering system**  Patient Characteristics: Distant Metastases or Locoregional Recurrent Disease - Unresected or Locally Advanced Unresectable Disease Progressing after Neoadjuvant and Local Therapies, HER2 Low/Negative/Unknown, ER Negative/Unknown, Chemotherapy, HER2 Negative/Unknown, First Line,  ER Negative, PD-L1 Expression Negative/Unknown Therapeutic Status: Distant Metastases HER2 Status: Negative (-) ER Status: Negative (-) PR Status: Negative (-) Therapy Approach Indicated: Standard Chemotherapy/Endocrine Therapy Line of Therapy: First Line PD-L1 Expression Status: PD-L1 Negative Intent of Therapy: Non-Curative / Palliative Intent, Discussed with Patient

## 2021-03-23 NOTE — Progress Notes (Signed)
[facility]  [phone]) [phone] [phone]) [phone]    ID: [name] DOB: [birth year]  MR#: [id number]  HCW#:[id number]  Patient Care Team: [name], MD as PCP - General (Family Medicine) Mauro Kaufmann, RN as Oncology Nurse Navigator Rockwell Germany, RN as Oncology Nurse Navigator Rolm Bookbinder, MD as Consulting Physician (General Surgery) Kyung Rudd, MD as Consulting Physician (Radiation Oncology) Meisinger, Sherren Mocha, MD as Consulting Physician (Obstetrics and Gynecology) Elsie Saas, MD as Consulting Physician (Orthopedic Surgery) Carol Ada, MD as Consulting Physician (Gastroenterology) Festus Aloe, MD as Consulting Physician (Urology) Benay Pike, MD OTHER MD:  CHIEF COMPLAINT: triple negative breast cancer (s/p right mastectomy)  CURRENT TREATMENT: Completing adjuvant radiation   INTERVAL HISTORY:  [name] returns today for follow up and treatment of her 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  She is accompanied by her family member.  Her mother was on the phone. Since last visit, she is in tremendous back pain and has been taking a lot of Tylenol and ibuprofen every day.  She tells me that she has not been getting much relief with this.  She was crunched in pain and looking down to the floor.   Besides the back pain, her drain has been working well, no issues.  She has also been feeling very tired and has low appetite. No change in breathing, bowel habits or urinary habits otherwise. Rest of the pertinent 10 point ROS reviewed and negative.  HISTORY OF CURRENT ILLNESS:    Cancer Staging  Malignant neoplasm of upper-outer quadrant of right breast in female, estrogen receptor positive ([place]) Staging form: Breast, AJCC 8th Edition - Pathologic stage from 03/31/2020: Stage IV (cM1) - Signed by Gardenia Phlegm, NP on 05/01/2020 - Clinical stage from 04/08/2020: Stage IIB (cT1c, cN1(f), cM0, G2, ER-, PR-, HER2-) - Signed by  Chauncey Cruel, MD on 04/08/2020 Stage prefix: Initial diagnosis Method of lymph node assessment: Core biopsy  56 y.o. [place] woman status post right breast upper outer quadrant biopsy 03/27/2020 for a clinical T1c N3 M1, stageIV invasive ductal carcinoma, functionally triple negative, with an MIB-1 of 30-40%  (a) chest CT scan 04/20/2020 shows in addition to the breast mass and regional adenopathy, lytic and sclerotic bone changes, possible left thoracic adenopathy, and indeterminant left pleural nodularity  (b) bone scan 04/21/2020 confirms multiple bone lesions  (c) thoracic MRI confirms diffuse bony metastatic disease but shows no epidural soft tissue component and no cord compression  (1) genetics testing 04/24/2020 through the [facility] [employer] + RNAinsight panel found no deleterious mutations in APC, ATM, AXIN2, BARD1, BMPR1A, BRCA1, BRCA2, BRIP1, CDH1, CDK4, CDKN2A, CHEK2, DICER1, EPCAM, GREM1, HOXB13, MEN1, MLH1, MSH2, MSH3, MSH6, MUTYH, NBN, NF1, NF2, NTHL1, PALB2, PMS2, POLD1, POLE, PTEN, RAD51C, RAD51D, RECQL, RET, SDHA, SDHAF2, SDHB, SDHC, SDHD, SMAD4, SMARCA4, STK11, TP53, TSC1, TSC2, and VHL.  RNA data is routinely analyzed for use in variant interpretation for all genes.  (2) neoadjuvant chemotherapy consisted of pembrolizumab every 3 weeks x 4 with carboplatin every 3 weeks x 4 with paclitaxel weekly x12, started 04/24/2020, completed 07/10/2020  (a) did not receive pembolizumab, doxorubicin,cyclophosphamide every 3 weeks x 4 but proceeded to observation  (3) zoledronate started 56/25/2020, repeated every 12 weeks  (4) molecular studies:  (A) PD-L1 combined positive score of 5 (on January 2022 specimen)  (B) foundation 1 from the 03/27/2020 sample was microsatellite stable, with low mutational burden (0), and no reportable alterations in BRCA1 or 2, ER BB 2, or PIK  3 CA.  There was REL amplification and a TP53 mutation.  (5) restaging studies:  (a) post-chemo breast  MRI 06/30/2020 shows significant response  (b) CT chest 07/09/2020 shows decreased right axillary adenopathy, no lung or liver lesions, healing of lytic bone lesions  (c) CT chest 10/07/2020 shows progression of the right axillary adenopathy and upper abdominal retroperitoneal adenopathy, with stable scattered sclerotic bone lesions--this led to surgery (see #9 below)  (6) adjuvant pembrolizumab resumed 56/20/2022, discontinued after 10/02/2020 dose with disease progression  (7) anastrozole started 07/26/2020 given (weak) estrogen and progesterone receptor positivity  (A) MRI breast 09/28/2020 shows local progression in the right breast and axilla-- anastrozole discontinued   (8) fulvestrant started 10/08/2020  (A) abemaciclib added 10/24/2018  (B) fulvestrant discontinued after right modified radical mastectomy with repeat prognostic panel showing the tumor to be triple negative  (C) abemaciclib discontinued November 2022 with increased liver function tests  (9) status post right modified radical mastectomy on 12/07/2020 for a residual ympT2 ypN3a invasive ductal carcinoma, e-cadherin positive, grade 3, with negative margins  (a) a total of 13 right axillary lymph nodes removed, 12 positive, with evidence of extracapsular extension; residual cancer burden III  (b) repeat prognostic panel estrogen and progesterone receptor negative, HER2 negative (1+).    (10) adjuvant radiation to be completed 03/09/2021  (11) admitted 02/01/2021 with worsening biliary obstruction:             (A) CT scans of the chest abdomen and pelvis 12/29/2020 show no liver or lung involvement.  There is some left retroperitoneal adenopathy which may be the only site of measurable disease and there are some bone lesions.             (B) right upper quadrant abdominal ultrasound 01/28/2021 shows gallbladder sludge, no liver lesions              (C) CT scan of the abdomen and pelvis 02/01/2021 shows intrahepatic biliary  dilatation, gallbladder wall thickening, narrowing of the hepatic ducts, stranding around the pancreatic head, and new right hydroureter             (D) noncontrast MRI of the abdomen 02/01/2021 shows no liver masses, dilatation of the intrahepatic bile ducts with stricture of the common hepatic duct in the liver hilum, no evidence of cholecystitis, a solid 3.6 cm mass in the pancreatic head consistent with focal pancreatitis, moderate right hydronephrosis             (E) ERCP 02/02/2021--unable to cannulate CBD             (F) external biliary drain placement with cholangiogram 02/02/2021; 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  There was no abnormality noted in the liver.  Brushings from this procedure were read as suspicious for malignancy.  PET/CT done on 03/11/2021 showed extensive and progressive osseous metastatic disease with mixed lytic and sclerotic hypermetabolic lesions throughout the axial and appendicular skeleton, extensive hypermetabolic mediastinal and hilar lymphadenopathy, lower lobe interstitial prominence and hypermetabolism could be due to an inflammatory/infectious spread or interstitial spread of tumor, marked hypermetabolism in the pancreatic head could be chronic inflammation.  Extensive hypermetabolic abdominal and pelvic lymphadenopathy, peritoneal implants and omental metastatic disease.  Chronic right-sided hydronephrosis possibly related to tumor involvement of the  right ureter.    PAST MEDICAL HISTORY: Past Medical History:  Diagnosis Date   Allergy    Arthritis 2018   hands, left knee   Breast cancer ([place]) 03-27-2020   right breast IMC   Eczema    Family history of breast cancer    Family history of lung cancer     Family history of prostate cancer    Hypertension     PAST SURGICAL HISTORY: Past Surgical History:  Procedure Laterality Date   ABDOMINAL HYSTERECTOMY     CESAREAN SECTION     ERCP N/A 02/02/2021   Procedure: ENDOSCOPIC RETROGRADE CHOLANGIOPANCREATOGRAPHY (ERCP);  Surgeon: Carol Ada, MD;  Location: [facility];  Service: Endoscopy;  Laterality: N/A;   ESOPHAGOGASTRODUODENOSCOPY N/A 02/09/2021   Procedure: ESOPHAGOGASTRODUODENOSCOPY (EGD);  Surgeon: Carol Ada, MD;  Location: [facility];  Service: Endoscopy;  Laterality: N/A;   IR BILIARY DRAIN PLACEMENT WITH CHOLANGIOGRAM  02/03/2021   IR ENDOLUMINAL BX OF BILIARY TREE  02/10/2021   IR EXCHANGE BILIARY DRAIN  02/10/2021   KNEE ARTHROSCOPY W/ ACL RECONSTRUCTION Left    KNEE SURGERY     MODIFIED MASTECTOMY Right 12/07/2020   Procedure: RIGHT MODIFIED RADICAL MASTECTOMY;  Surgeon: Rolm Bookbinder, MD;  Location: [facility];  Service: General;  Laterality: Right;   PORTACATH PLACEMENT Left 04/23/2020   Procedure: LEFT SIDE PORT PLACEMENT WITH ULTRASOUND GUIDANCE;  Surgeon: Rolm Bookbinder, MD;  Location: [facility];  Service: General;  Laterality: Left;  230PM START TIME PLEASE ROOM 2 THANKS   SPHINCTEROTOMY  02/02/2021   Procedure: SPHINCTEROTOMY;  Surgeon: Carol Ada, MD;  Location: [facility];  Service: Endoscopy;;   TUBAL LIGATION     UPPER ESOPHAGEAL ENDOSCOPIC ULTRASOUND (EUS) N/A 02/09/2021   Procedure: UPPER ESOPHAGEAL ENDOSCOPIC ULTRASOUND (EUS);  Surgeon: Carol Ada, MD;  Location: [facility];  Service: Endoscopy;  Laterality: N/A;    FAMILY HISTORY: Family History  Problem Relation Age of Onset   Breast cancer Mother 42   Arthritis Mother    Diabetes Father    Kidney disease Father    Hypertension Sister    Heart disease Maternal Grandmother    Breast cancer Paternal Grandmother        dx >50   Heart disease Maternal Aunt    Breast cancer Maternal Aunt 77    Other Maternal Aunt        brain tumor (not cancerous)   Lung cancer Maternal Aunt    Prostate cancer Cousin 24       localized   Her parents are both living, her father age 74 and mother age 37, as of 03/2020. [name] has 1 brother and 2 sisters. She reports breast cancer in her mother at age 32, also in her her maternal aunt, and her paternal grandmother. Her maternal aunt also has a history of lung cancer.  The patient also reports prostate cancer in a maternal cousin in his 7's.   GYNECOLOGIC HISTORY:  No LMP recorded. Patient has had a hysterectomy. Menarche: 56 years old Age at first live birth: 56 years old [place] P 2 LMP 2010 with hysterectomy Contraceptive: prior use without issue HRT never used  Hysterectomy? Yes, 2010 for fibroids BSO? 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 She is divorced. She lives at home with son [name], age 11, who works for [employer]. Son Meg[place]el, Brooke Bonito, age 43, works as a Development worker, international aid in [place], [place].  Yenni has no grandchildren. She attends [employer].    ADVANCED DIRECTIVES: not in place; considering naming mother Melody Haver, son [name], or son Raynald as healthcare powers of attorney   HEALTH MAINTENANCE: Social History   Tobacco Use   Smoking status: Former    Packs/day: 0.25    Years: 36.00    Pack years: 9.00    Types: Cigarettes    Quit date: 04/22/2017    Years since quitting: 3.9   Smokeless tobacco: Never   Tobacco comments:    She is trying to quit.    Vaping Use   Vaping Use: Never used  Substance Use Topics   Alcohol use: Yes    Alcohol/week: 3.0 standard drinks    Types: 3 Glasses of wine per week    Comment: Occasional    Drug use: Never     Colonoscopy: age 59  PAP: date unsure  Bone density: never done   No Known Allergies  Current Outpatient Medications  Medication Sig Dispense Refill   acetaminophen  (TYLENOL) 500 MG tablet Take 1,000 mg by mouth every 6 (six) hours as needed for moderate pain.     Boric Acid Vaginal 600 MG SUPP Place 600 mg vaginally daily as needed (yeast infections).     Cholecalciferol (VITAMIN D) 125 MCG (5000 UT) CAPS Take 5,000 Units by mouth every morning.     clobetasol ointment (TEMOVATE) 6.33 % Apply 1 application topically 2 (two) times daily as needed (rash/itching).     Cyanocobalamin (B-12) 5000 MCG CAPS Take 5,000 mcg by mouth every morning.     loperamide (IMODIUM) 2 MG capsule Take 2 tablets after first diarrheal bowel movement, then one tablet after each following diarrheal movement; maximum 6 tablets/ day (Patient taking differently: 2-4 mg See admin instructions. Take 2 tablets  (4 mg) by mouth after first diarrheal bowel movement, then one tablet (2 mg) after each following diarrheal movement; maximum 6 tablets/ day) 60 capsule 6   methocarbamol (ROBAXIN) 500 MG tablet Take 1 tablet (500 mg total) by mouth every 6 (six) hours as needed for muscle spasms. 30 tablet 1   neomycin-bacitracin-polymyxin (NEOSPORIN) OINT Apply 1 application topically 2 (two) times daily. 60 g 0   prochlorperazine (COMPAZINE) 10 MG tablet Take 1 tablet (10 mg total) by mouth every 6 (six) hours as needed for nausea or vomiting. 30 tablet 4   propranolol (INDERAL) 10 MG tablet Take 10 mg by mouth 2 (two) times daily.     Sodium Chloride Flush (NORMAL SALINE FLUSH) 0.9 % SOLN Inject 10 mLs into the vein daily. 300 mL 0   spironolactone (ALDACTONE) 25 MG tablet Take 25 mg by mouth 2 (two) times daily.     No current facility-administered medications for this visit.    OBJECTIVE: African-American woman who appears stated age 61:   03/23/21 1346  BP: 116/85  Pulse: (!) 135  Resp: 20  Temp: (!) 97 F (36.1 C)  SpO2: 100%       Body mass index is 24.91 kg/m.   Wt Readings from Last 3 Encounters:  03/23/21 173 lb 9.6 oz (78.7 kg)  03/09/21 183 lb 1.6 oz (83.1 kg)   02/25/21 194 lb 2 oz (88.1 kg)     ECOG FS:1 - Symptomatic but completely ambulatory  Sclerae unicteric, EOMs intact Wearing a mask No cervical or supraclavicular adenopathy Lungs no rales or rhonchi Heart regular rate and rhythm Abd soft, nontender, positive bowel sounds MSK no focal spinal  tenderness, no right upper extremity lymphedema Neuro: nonfocal, well oriented, positive affect Breasts: The right breast is status postmastectomy with associated axillary lymph node dissection.  There is no evidence of chest wall recurrence.  This area is currently receiving radiation.  There is significant hyperpigmentation mild dry desquamation.  The left breast and left axilla are benign.  LAB RESULTS:  CMP     Component Value Date/Time   NA 135 03/09/2021 1036   K 3.8 03/09/2021 1036   CL 101 03/09/2021 1036   CO2 24 03/09/2021 1036   GLUCOSE 105 (H) 03/09/2021 1036   BUN 15 03/09/2021 1036   CREATININE 0.80 03/09/2021 1036   CREATININE 0.93 12/31/2020 1554   CREATININE 0.69 09/24/2013 0819   CALCIUM 10.4 (H) 03/09/2021 1036   PROT 7.7 03/09/2021 1036   ALBUMIN 3.7 03/09/2021 1036   AST 26 03/09/2021 1036   AST 16 12/31/2020 1554   ALT 13 03/09/2021 1036   ALT 11 12/31/2020 1554   ALKPHOS 218 (H) 03/09/2021 1036   BILITOT 1.0 03/09/2021 1036   BILITOT 0.4 12/31/2020 1554   GFRNONAA >60 03/09/2021 1036   GFRNONAA >60 12/31/2020 1554   GFRNONAA >89 09/24/2013 0819   GFRAA >89 09/24/2013 0819    No results found for: TOTALPROTELP, ALBUMINELP, A1GS, A2GS, BETS, BETA2SER, GAMS, MSPIKE, SPEI  Lab Results  Component Value Date   WBC 7.0 03/09/2021   NEUTROABS 4.2 03/09/2021   HGB 10.1 (L) 03/09/2021   HCT 31.7 (L) 03/09/2021   MCV 94.6 03/09/2021   PLT 220 03/09/2021    No results found for: LABCA2  No components found for: [id number]  No results for input(s): INR in the last 168 hours.   No results found for: LABCA2  No results found for: VEL381  No results found  for: OFB510  No results found for: CHE527  No results found for: CA2729  No components found for: HGQUANT  No results found for: CEA1 / No results found for: CEA1   No results found for: AFPTUMOR  No results found for: CHROMOGRNA  No results found for: KPAFRELGTCHN, LAMBDASER, KAPLAMBRATIO (kappa/lambda light chains)  No results found for: HGBA, HGBA2QUANT, HGBFQUANT, HGBSQUAN (Hemoglobinopathy evaluation)   No results found for: LDH  No results found for: IRON, TIBC, IRONPCTSAT (Iron and TIBC)  No results found for: FERRITIN  Urinalysis    Component Value Date/Time   COLORURINE AMBER (A) 02/01/2021 [place] 02/01/2021 1658   LABSPEC 1.033 (H) 02/01/2021 1658   PHURINE 5.0 02/01/2021 [place] 02/01/2021 1658   HGBUR NEGATIVE 02/01/2021 1658   BILIRUBINUR NEGATIVE 02/01/2021 1658   KETONESUR NEGATIVE 02/01/2021 1658   PROTEINUR NEGATIVE 02/01/2021 1658   NITRITE NEGATIVE 02/01/2021 1658   LEUKOCYTESUR NEGATIVE 02/01/2021 1658    STUDIES: NM PET Image Initial (PI) Skull Base To Thigh  Result Date: 03/11/2021 CLINICAL DATA:  Subsequent treatment strategy for metastatic breast cancer. EXAM: NUCLEAR MEDICINE PET SKULL BASE TO THIGH TECHNIQUE: 9.18 mCi F-18 FDG was injected intravenously. Full-ring PET imaging was performed from the skull base to thigh after the radiotracer. CT data was obtained and used for attenuation correction and anatomic localization. Fasting blood glucose: 101 mg/dl COMPARISON:  CT scan 02/06/2021 FINDINGS: Mediastinal blood pool activity: SUV max 2.62 Liver activity: SUV max NA NECK: No hypermetabolic lymph nodes in the neck. Incidental CT findings: none CHEST: Surgical changes from right mastectomy and right lymph node dissection. No findings suspicious for recurrent chest wall tumor. No  enlarged or hypermetabolic axillary or supraclavicular adenopathy. Extensive hypermetabolic mediastinal and hilar lymphadenopathy.  Left hilar node is difficult to measure but the SUV max is 16.15. Right hilar node measures 15 mm and SUV max is 11.65. Right paratracheal node measures 9 mm and SUV max is 16.21. No measurable pulmonary nodules are identified. Lower lobe interstitial thickening and moderate hypermetabolism (4.77) could suggest an inflammatory process or interstitial spread of tumor. Incidental CT findings: Stable vascular calcifications. The left-sided Port-A-Cath is in good position. ABDOMEN/PELVIS: No findings for hepatic or adrenal gland metastasis. There is diffuse and marked hypermetabolism in the pancreatic head. SUV max in the pancreatic head is 12.42. A 4.6 cm pseudocyst is noted in the lesser sac. Surrounding hypermetabolic lymphadenopathy. Hypermetabolic omental nodularity with a cystic area which could be a pseudocyst. Celiac axis node measuring 17 mm on image 113/4 has an SUV max of 17.46. Bilateral iliac hypermetabolic lymphadenopathy. Two left-sided adjacent 9 mm nodes on image 147/4 has an SUV max of 11.16. Smaller right-sided common iliac nodes are also hypermetabolic. 13 mm left obturator node on image 166/4 has an SUV max of 11.16. Incidental CT findings: Stable scattered vascular calcifications. There is marked right-sided hydronephrosis with findings worrisome for tumoral involvement of the right proximal ureter. The bladder is grossly. Free pelvic fluid is noted. Biliary drainage catheter remains in place SKELETON: Diffuse mixed lytic and sclerotic hypermetabolic bone disease involving the cervical, thoracic and lumbar spines, bilateral scapulae, the sternum, ribs, extensive involvement of the bony pelvis and left lesser trochanter. Findings are progressive compared to the prior CT scan. No obvious pathologic fracture or spinal canal compromise. Incidental CT findings: none IMPRESSION: 1. Extensive and progressive osseous metastatic disease with mixed lytic and sclerotic hypermetabolic lesions throughout the  axial and appendicular skeleton. 2. Extensive hypermetabolic mediastinal and hilar lymphadenopathy. 3. Lower lobe interstitial prominence and hypermetabolism could be due to an inflammatory/infectious process or interstitial spread of tumor. 4. Marked hypermetabolism in the pancreatic head could be chronic inflammation or neoplasm. 5. Extensive hypermetabolic abdominal and pelvic lymphadenopathy, peritoneal implants and omental metastatic disease. 6. 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 Electronically Signed   By: Marijo Sanes M.D.   On: 03/11/2021 17:15     ELIGIBLE FOR AVAILABLE RESEARCH PROTOCOL: no  ASSESSMENT: 56 y.o. [place] woman status post right breast upper outer quadrant biopsy 03/27/2020 for a clinical T1c N3 M1, stageIV invasive ductal carcinoma, functionally triple negative, with an MIB-1 of 30-40%  (a) chest CT scan 04/20/2020 shows in addition to the breast mass and regional adenopathy, lytic and sclerotic bone changes, possible left thoracic adenopathy, and indeterminant left pleural nodularity  (b) bone scan 04/21/2020 confirms multiple bone lesions  (c) thoracic MRI confirms diffuse bony metastatic disease but shows no epidural soft tissue component and no cord compression  (1) genetics testing 04/24/2020 through the [facility] [employer]Cancer + RNAinsight panel found no deleterious mutations in APC, ATM, AXIN2, BARD1, BMPR1A, BRCA1, BRCA2, BRIP1, CDH1, CDK4, CDKN2A, CHEK2, DICER1, EPCAM, GREM1, HOXB13, MEN1, MLH1, MSH2, MSH3, MSH6, MUTYH, NBN, NF1, NF2, NTHL1, PALB2, PMS2, POLD1, POLE, PTEN, RAD51C, RAD51D, RECQL, RET, SDHA, SDHAF2, SDHB, SDHC, SDHD, SMAD4, SMARCA4, STK11, TP53, TSC1, TSC2, and VHL.  RNA data is routinely analyzed for use in variant interpretation for all genes.  (2) neoadjuvant chemotherapy consisted of pembrolizumab every 3 weeks x 4 with carboplatin every 3 weeks x 4 with paclitaxel weekly x12, started  04/24/2020, completed 07/10/2020  (a) did not receive pembolizumab,  doxorubicin,cyclophosphamide every 3 weeks x 4 but proceeded to observation  (3) zoledronate started 56/25/2020, repeated every 12 weeks  (4) molecular studies:  (A) PT-L1 combined positive score of 5 (on January 2022 specimen)  (B) foundation 1 from the 03/27/2020 sample was microsatellite stable, with low mutational burden (0), and no reportable alterations in BRCA1 or 2, ER BB 2, or PIK 3 CA.  There was REL amplification and a TP53 mutation.  (5) restaging studies:  (a) post-chemo breast MRI 06/30/2020 shows significant response  (b) CT chest 07/09/2020 shows decreased right axillary adenopathy, no lung or liver lesions, healing of lytic bone lesions  (c) CT chest 10/07/2020 shows progression of the right axillary adenopathy and upper abdominal retroperitoneal adenopathy, with stable scattered sclerotic bone lesions--this led to surgery (see #9 below)  (6) adjuvant pembrolizumab resumed 56/20/2022, discontinued after 10/02/2020 dose with disease progression  (7) anastrozole started 07/26/2020 given (weak) estrogen and progesterone receptor positivity  (A) MRI breast 09/28/2020 shows local progression in the right breast and axilla-- anastrozole discontinued   (8) fulvestrant started 10/08/2020  (A) abemaciclib added 10/24/2018  (B) fulvestrant discontinued after right modified radical mastectomy with repeat prognostic panel showing the tumor to be triple negative  (C) abemaciclib discontinued November 2022 with increased liver function tests  (9) status post right modified radical mastectomy on 12/07/2020 for a residual ympT2 ypN3a invasive ductal carcinoma, e-cadherin positive, grade 3, with negative margins  (a) a total of 13 right axillary lymph nodes removed, 12 positive, with evidence of extracapsular extension; residual cancer burden III  (b) repeat prognostic panel estrogen and progesterone receptor negative, HER2  negative (1+).    (10) adjuvant radiation to be completed 03/09/2021  (11) admitted 02/01/2021 with worsening biliary obstruction:             (A) CT scans of the chest abdomen and pelvis 12/29/2020 show no liver or lung involvement.  There is some left retroperitoneal adenopathy which may be the only site of measurable disease and there are some bone lesions.             (B) right upper quadrant abdominal ultrasound 01/28/2021 shows gallbladder sludge, no liver lesions              (C) CT scan of the abdomen and pelvis 02/01/2021 shows intrahepatic biliary dilatation, gallbladder wall thickening, narrowing of the hepatic ducts, stranding around the pancreatic head, and new right hydroureter             (D) noncontrast MRI of the abdomen 02/01/2021 shows no liver masses, dilatation of the intrahepatic bile ducts with stricture of the common hepatic duct in the liver hilum, no evidence of cholecystitis, a solid 3.6 cm mass in the pancreatic head consistent with focal pancreatitis, moderate right hydronephrosis             (E) ERCP 02/02/2021--unable to cannulate CBD             (F) external biliary drain placement with cholangiogram 02/02/2021; 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  There was no abnormality noted in the liver.  Brushings from this procedure were read as suspicious for malignancy   PET/CT done on 03/11/2021 showed extensive and progressive osseous metastatic disease with mixed lytic and sclerotic hypermetabolic lesions  throughout the axial and appendicular skeleton, extensive hypermetabolic mediastinal and hilar lymphadenopathy, lower lobe interstitial prominence and hypermetabolism could be due to an inflammatory/infectious spread or  interstitial spread of tumor, marked hypermetabolism in the pancreatic head could be chronic inflammation.  Extensive hypermetabolic abdominal and pelvic lymphadenopathy, peritoneal implants and omental metastatic disease.  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 PLAN:  [name] is here for follow-up after her recent PET imaging.   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  We have discussed the noncurative intent of treatment.  I have discussed about mechanism of action of Xeloda, adverse effects of Xeloda including but not limited to fatigue, nausea, vomiting, diarrhea, mouth sores, hand-foot syndrome, increased risk of infections, small risk of coronary vasospasm and severe toxicity in patients with DPD deficiency.  She is agreeable to proceed with Xeloda.  Have also changed her Zometa to every 4 weeks given progressive bony metastasis.  Bone cancer pain, she has been taking a lot of Tylenol and Aleve without significant relief.  I will start her on oxycodone 5 mg every 6 hours as needed for pain for pain control.  We will continue to monitor her pain medication needs.  I spent 40 minutes in the care of this patient including reviewing her history, previous medical records, counseling about future treatment options and follow-up recommendations   *Total Encounter Time as defined by the Centers for Medicare and Medicaid Services includes, in addition to the face-to-face time of a patient visit (documented in the note above) non-face-to-face time: obtaining and reviewing outside history, ordering and reviewing medications, tests or procedures, care coordination (communications with other health care professionals or  caregivers) and documentation in the medical record.

## 2021-03-24 ENCOUNTER — Other Ambulatory Visit (HOSPITAL_COMMUNITY): Payer: Self-pay

## 2021-03-24 ENCOUNTER — Telehealth: Payer: Self-pay

## 2021-03-24 ENCOUNTER — Other Ambulatory Visit: Payer: Self-pay | Admitting: Pharmacist

## 2021-03-24 DIAGNOSIS — C7951 Secondary malignant neoplasm of bone: Secondary | ICD-10-CM

## 2021-03-24 DIAGNOSIS — Z170 Estrogen receptor positive status [ER+]: Secondary | ICD-10-CM

## 2021-03-24 DIAGNOSIS — C50411 Malignant neoplasm of upper-outer quadrant of right female breast: Secondary | ICD-10-CM

## 2021-03-24 MED ORDER — CAPECITABINE 500 MG PO TABS
1000.0000 mg/m2 | ORAL_TABLET | Freq: Two times a day (BID) | ORAL | 4 refills | Status: DC
  Filled 2021-03-24: qty 112, 14d supply, fill #0

## 2021-03-24 NOTE — Telephone Encounter (Signed)
Oral Oncology Patient Advocate Encounter   Received notification from [place] that prior authorization for Xeloda is required.   PA submitted on CoverMyMeds Key [id number] Status is pending   Oral Oncology Clinic will continue to follow.  [place] Patient [place] Phone [phone] Fax [phone] 03/24/2021 3:12 PM

## 2021-03-25 ENCOUNTER — Other Ambulatory Visit (HOSPITAL_COMMUNITY): Payer: Self-pay

## 2021-03-25 ENCOUNTER — Encounter: Payer: Self-pay | Admitting: Hematology and Oncology

## 2021-03-25 MED ORDER — CAPECITABINE 500 MG PO TABS: 1000.0000 mg/m2 | ORAL_TABLET | Freq: Two times a day (BID) | ORAL | 4 refills | Status: DC

## 2021-03-25 MED ORDER — CAPECITABINE 500 MG PO TABS: 1000.0000 mg/m2 | ORAL_TABLET | Freq: Two times a day (BID) | ORAL | 4 refills | Status: AC

## 2021-03-25 NOTE — Telephone Encounter (Signed)
Oral Oncology Pharmacist Encounter  Received new prescription for capecitabine (Xeloda) for the treatment of triple negative breast cancer with progresssion on adjuvant immunotherapy, planned duration until disease progression or unacceptable toxicity.  Labs from 03/09/2022 assessed, no interventions needed.  Current medication list in Epic reviewed, DDIs with xeloda identified: none  Evaluated chart and no patient barriers to medication adherence noted.   Patient agreement for treatment documented in MD note on 03/23/2021.  Prescription has been e-scribed to the [facility] for benefits analysis and approval.  Oral Oncology Clinic will continue to follow for insurance authorization, copayment issues, initial counseling and start date.  Drema Halon, PharmD Hematology/Oncology Clinical Pharmacist [facility] [phone] 03/25/2021 8:15 AM

## 2021-03-25 NOTE — Telephone Encounter (Signed)
Oral Oncology Patient Advocate Encounter  Prior Authorization for Xeloda has been approved.    PA# [id number] Effective dates: 03/25/21 through 03/25/22  Patient must fill at [facility] will continue to follow.   [place] Patient [place] Phone [phone] Fax [phone] 03/25/2021 1:46 PM

## 2021-03-26 NOTE — Telephone Encounter (Signed)
Oral Chemotherapy Pharmacist Encounter  I spoke with patient for overview of: Xeloda (capecitabine) for the  treatment of triple negative breast cancer, planned duration until disease progression or unacceptable drug toxicity.  Counseled patient on administration, dosing, side effects, monitoring, drug-food interactions, safe handling, storage, and disposal.  Patient will take Xeloda 500mg  tablets, 4 tablets (2000mg ) by mouth in AM and 4 tabs (2000mg ) by mouth in PM, within 30 minutes of finishing meals, for 14 days on, 7 days off, repeated every 21 days.  Xeloda start date: 04/03/2021  Adverse effects include but are not limited to: fatigue, decreased blood counts, GI upset, diarrhea, mouth sores, and hand-foot syndrome.  Patient has anti-emetic on hand and knows to take it if nausea develops.   Patient will obtain anti diarrheal and alert the office of 4 or more loose stools above baseline.  Reviewed with patient importance of keeping a medication schedule and plan for any missed doses. No barriers to medication adherence identified.  Medication reconciliation performed and medication/allergy list updated.  Patient has to fill at [facility]. She already set up shipment to arrive late on January 20th.   Patient informed the pharmacy will reach out 5-7 days prior to needing next fill of Xeloda to coordinate continued medication acquisition to prevent break in therapy.  All questions answered.  Mrs. Strole voiced understanding and appreciation.   Medication education handout placed in mail for patient. Patient knows to call the office with questions or concerns. Oral Chemotherapy Clinic phone number provided to patient.   Drema Halon, PharmD Hematology/Oncology Clinical Pharmacist [facility] [phone]

## 2021-03-30 ENCOUNTER — Telehealth: Payer: Self-pay | Admitting: *Deleted

## 2021-03-30 NOTE — Telephone Encounter (Signed)
This RN spoke with pt per her discussion with oral pharmacy 1/13.  Pt states her R calf pain has almost resolved- she denies any focus of pain- no redness. She did have swelling in both feet on Friday which has also resolved.  Per discussion on phone- she states pain in her back is primary issue with pain interfering with her ADL's.  She states the Oxyir does decrease pain " but I am concerned about getting hooked on it if I use too much "  Pt states early satiety with food - and therefore is not able to eat well.  She is able to hydrate very well but mainly with gatorade and water- milkshakes or food source liquid seems to not be tolerated well.  She states good bowel movements at this time.  She states she feels " I still got this tube in my stomach and that is bothering me " She states she flushes the tube daily without any issues.  Her mother will be returning to be of assistance in the home.  This RN discussed above including issue of " getting hooked " - discussed need of use of opiod for pain relief for greatest function due to tumor burden and as the tumor burden is lessened with treatment she will use less Oxy ir.  This RN reviewed above with MD including biliary drain - with noted current normal readings of her bilirubin since placement.  Recommendation given with agreement on pt needing to use Oxy IR more aggressively for pain management and function. Bilirubin will be monitored once pt is on xeloda  for possible removal.  Appts needs to be scheduled for follow up and zometa therapy.  This RN called pt and discussed above including increasing use of Oxy IR ( she only uses it as the " pain comes on" with 1 tablet twice a day ". This RN recommended she should take 1 tab every 8 hours at present to keep pain at it's lowest and stay ahead of the pain. She understands need to maintain good bowel prophylaxis.  This RN discussed need to maintain biliary drainage tube with noted  normal labs and once she is on xeloda we may be able to remove it.  This RN discussed use of "high calorie liquid intake " even if she can only take small amounts - at a time.   This RN again informed her goal is to see improvement with above with new treatment regimen which is states she should be starting this Friday.  This RN will follow up with needed appointments.

## 2021-04-04 ENCOUNTER — Encounter (HOSPITAL_COMMUNITY): Payer: Self-pay

## 2021-04-04 ENCOUNTER — Other Ambulatory Visit: Payer: Self-pay

## 2021-04-04 DIAGNOSIS — Z170 Estrogen receptor positive status [ER+]: Secondary | ICD-10-CM

## 2021-04-04 DIAGNOSIS — D6959 Other secondary thrombocytopenia: Secondary | ICD-10-CM | POA: Diagnosis present

## 2021-04-04 DIAGNOSIS — R748 Abnormal levels of other serum enzymes: Secondary | ICD-10-CM | POA: Diagnosis present

## 2021-04-04 DIAGNOSIS — C7989 Secondary malignant neoplasm of other specified sites: Secondary | ICD-10-CM | POA: Diagnosis present

## 2021-04-04 DIAGNOSIS — T451X5A Adverse effect of antineoplastic and immunosuppressive drugs, initial encounter: Secondary | ICD-10-CM | POA: Diagnosis present

## 2021-04-04 DIAGNOSIS — N170 Acute kidney failure with tubular necrosis: Secondary | ICD-10-CM | POA: Diagnosis present

## 2021-04-04 DIAGNOSIS — Z841 Family history of disorders of kidney and ureter: Secondary | ICD-10-CM

## 2021-04-04 DIAGNOSIS — Z79899 Other long term (current) drug therapy: Secondary | ICD-10-CM

## 2021-04-04 DIAGNOSIS — I82413 Acute embolism and thrombosis of femoral vein, bilateral: Secondary | ICD-10-CM | POA: Diagnosis present

## 2021-04-04 DIAGNOSIS — Z9011 Acquired absence of right breast and nipple: Secondary | ICD-10-CM

## 2021-04-04 DIAGNOSIS — E860 Dehydration: Secondary | ICD-10-CM | POA: Diagnosis not present

## 2021-04-04 DIAGNOSIS — E871 Hypo-osmolality and hyponatremia: Secondary | ICD-10-CM | POA: Diagnosis present

## 2021-04-04 DIAGNOSIS — Z803 Family history of malignant neoplasm of breast: Secondary | ICD-10-CM

## 2021-04-04 DIAGNOSIS — I10 Essential (primary) hypertension: Secondary | ICD-10-CM | POA: Diagnosis present

## 2021-04-04 DIAGNOSIS — G8929 Other chronic pain: Secondary | ICD-10-CM | POA: Diagnosis present

## 2021-04-04 DIAGNOSIS — D6481 Anemia due to antineoplastic chemotherapy: Secondary | ICD-10-CM | POA: Diagnosis present

## 2021-04-04 DIAGNOSIS — G928 Other toxic encephalopathy: Secondary | ICD-10-CM | POA: Diagnosis present

## 2021-04-04 DIAGNOSIS — Z87891 Personal history of nicotine dependence: Secondary | ICD-10-CM

## 2021-04-04 DIAGNOSIS — E875 Hyperkalemia: Secondary | ICD-10-CM | POA: Diagnosis not present

## 2021-04-04 DIAGNOSIS — A419 Sepsis, unspecified organism: Secondary | ICD-10-CM | POA: Diagnosis not present

## 2021-04-04 DIAGNOSIS — N131 Hydronephrosis with ureteral stricture, not elsewhere classified: Secondary | ICD-10-CM | POA: Diagnosis present

## 2021-04-04 DIAGNOSIS — Z8249 Family history of ischemic heart disease and other diseases of the circulatory system: Secondary | ICD-10-CM

## 2021-04-04 DIAGNOSIS — E872 Acidosis, unspecified: Secondary | ICD-10-CM | POA: Diagnosis present

## 2021-04-04 DIAGNOSIS — D630 Anemia in neoplastic disease: Secondary | ICD-10-CM | POA: Diagnosis present

## 2021-04-04 DIAGNOSIS — Z66 Do not resuscitate: Secondary | ICD-10-CM | POA: Diagnosis not present

## 2021-04-04 DIAGNOSIS — R600 Localized edema: Secondary | ICD-10-CM | POA: Diagnosis present

## 2021-04-04 DIAGNOSIS — R791 Abnormal coagulation profile: Secondary | ICD-10-CM | POA: Diagnosis present

## 2021-04-04 DIAGNOSIS — C50411 Malignant neoplasm of upper-outer quadrant of right female breast: Secondary | ICD-10-CM | POA: Diagnosis present

## 2021-04-04 DIAGNOSIS — J9601 Acute respiratory failure with hypoxia: Secondary | ICD-10-CM | POA: Diagnosis not present

## 2021-04-04 DIAGNOSIS — Z20822 Contact with and (suspected) exposure to covid-19: Secondary | ICD-10-CM | POA: Diagnosis present

## 2021-04-04 DIAGNOSIS — I2699 Other pulmonary embolism without acute cor pulmonale: Secondary | ICD-10-CM | POA: Diagnosis not present

## 2021-04-04 DIAGNOSIS — K831 Obstruction of bile duct: Secondary | ICD-10-CM | POA: Diagnosis present

## 2021-04-04 DIAGNOSIS — C786 Secondary malignant neoplasm of retroperitoneum and peritoneum: Secondary | ICD-10-CM | POA: Diagnosis present

## 2021-04-04 DIAGNOSIS — R739 Hyperglycemia, unspecified: Secondary | ICD-10-CM | POA: Diagnosis present

## 2021-04-04 DIAGNOSIS — R570 Cardiogenic shock: Secondary | ICD-10-CM | POA: Diagnosis not present

## 2021-04-04 DIAGNOSIS — R6521 Severe sepsis with septic shock: Secondary | ICD-10-CM | POA: Diagnosis present

## 2021-04-04 DIAGNOSIS — C772 Secondary and unspecified malignant neoplasm of intra-abdominal lymph nodes: Secondary | ICD-10-CM | POA: Diagnosis present

## 2021-04-04 DIAGNOSIS — C7951 Secondary malignant neoplasm of bone: Secondary | ICD-10-CM | POA: Diagnosis present

## 2021-04-04 NOTE — ED Triage Notes (Signed)
Pt arrives EMS from home with increased weakness, lack of appetite and back pain. Hx of breast cancer with mets to bone. Finished radiation in December currently taking Chemo pills.

## 2021-04-05 ENCOUNTER — Emergency Department (HOSPITAL_COMMUNITY): Payer: BC Managed Care – PPO

## 2021-04-05 ENCOUNTER — Ambulatory Visit
Admission: RE | Admit: 2021-04-05 | Discharge: 2021-04-05 | Disposition: A | Discharge status: Home / Self Care | Payer: BC Managed Care – PPO | Source: Ambulatory Visit | Attending: Hematology and Oncology | Admitting: Hematology and Oncology

## 2021-04-05 ENCOUNTER — Inpatient Hospital Stay (HOSPITAL_COMMUNITY)
Admission: EM | Admit: 2021-04-05 | Discharge: 2021-04-14 | DRG: 853 | Disposition: E | Payer: BC Managed Care – PPO | Attending: Pulmonary Disease | Admitting: Pulmonary Disease

## 2021-04-05 ENCOUNTER — Inpatient Hospital Stay (HOSPITAL_COMMUNITY): Payer: BC Managed Care – PPO

## 2021-04-05 ENCOUNTER — Encounter (HOSPITAL_COMMUNITY): Payer: Self-pay | Admitting: Emergency Medicine

## 2021-04-05 ENCOUNTER — Other Ambulatory Visit: Payer: Self-pay

## 2021-04-05 DIAGNOSIS — J9601 Acute respiratory failure with hypoxia: Secondary | ICD-10-CM | POA: Diagnosis not present

## 2021-04-05 DIAGNOSIS — E872 Acidosis, unspecified: Secondary | ICD-10-CM | POA: Diagnosis present

## 2021-04-05 DIAGNOSIS — E871 Hypo-osmolality and hyponatremia: Secondary | ICD-10-CM

## 2021-04-05 DIAGNOSIS — Z170 Estrogen receptor positive status [ER+]: Secondary | ICD-10-CM

## 2021-04-05 DIAGNOSIS — C801 Malignant (primary) neoplasm, unspecified: Secondary | ICD-10-CM | POA: Diagnosis not present

## 2021-04-05 DIAGNOSIS — K831 Obstruction of bile duct: Secondary | ICD-10-CM | POA: Diagnosis present

## 2021-04-05 DIAGNOSIS — I2699 Other pulmonary embolism without acute cor pulmonale: Secondary | ICD-10-CM | POA: Diagnosis not present

## 2021-04-05 DIAGNOSIS — A419 Sepsis, unspecified organism: Secondary | ICD-10-CM | POA: Diagnosis present

## 2021-04-05 DIAGNOSIS — N179 Acute kidney failure, unspecified: Secondary | ICD-10-CM | POA: Diagnosis not present

## 2021-04-05 DIAGNOSIS — Z452 Encounter for adjustment and management of vascular access device: Secondary | ICD-10-CM

## 2021-04-05 DIAGNOSIS — Z66 Do not resuscitate: Secondary | ICD-10-CM | POA: Diagnosis not present

## 2021-04-05 DIAGNOSIS — C7989 Secondary malignant neoplasm of other specified sites: Secondary | ICD-10-CM | POA: Diagnosis present

## 2021-04-05 DIAGNOSIS — C7951 Secondary malignant neoplasm of bone: Secondary | ICD-10-CM | POA: Diagnosis present

## 2021-04-05 DIAGNOSIS — I824Y3 Acute embolism and thrombosis of unspecified deep veins of proximal lower extremity, bilateral: Secondary | ICD-10-CM | POA: Diagnosis present

## 2021-04-05 DIAGNOSIS — N1330 Unspecified hydronephrosis: Secondary | ICD-10-CM | POA: Diagnosis present

## 2021-04-05 DIAGNOSIS — E875 Hyperkalemia: Secondary | ICD-10-CM

## 2021-04-05 DIAGNOSIS — I82413 Acute embolism and thrombosis of femoral vein, bilateral: Secondary | ICD-10-CM | POA: Diagnosis present

## 2021-04-05 DIAGNOSIS — N131 Hydronephrosis with ureteral stricture, not elsewhere classified: Secondary | ICD-10-CM | POA: Diagnosis present

## 2021-04-05 DIAGNOSIS — R578 Other shock: Secondary | ICD-10-CM | POA: Diagnosis not present

## 2021-04-05 DIAGNOSIS — J9600 Acute respiratory failure, unspecified whether with hypoxia or hypercapnia: Secondary | ICD-10-CM

## 2021-04-05 DIAGNOSIS — M7989 Other specified soft tissue disorders: Secondary | ICD-10-CM

## 2021-04-05 DIAGNOSIS — I10 Essential (primary) hypertension: Secondary | ICD-10-CM | POA: Diagnosis present

## 2021-04-05 DIAGNOSIS — D6959 Other secondary thrombocytopenia: Secondary | ICD-10-CM | POA: Diagnosis present

## 2021-04-05 DIAGNOSIS — D630 Anemia in neoplastic disease: Secondary | ICD-10-CM | POA: Diagnosis present

## 2021-04-05 DIAGNOSIS — R6520 Severe sepsis without septic shock: Secondary | ICD-10-CM

## 2021-04-05 DIAGNOSIS — G9341 Metabolic encephalopathy: Secondary | ICD-10-CM | POA: Diagnosis not present

## 2021-04-05 DIAGNOSIS — N170 Acute kidney failure with tubular necrosis: Secondary | ICD-10-CM | POA: Diagnosis present

## 2021-04-05 DIAGNOSIS — Z20822 Contact with and (suspected) exposure to covid-19: Secondary | ICD-10-CM | POA: Diagnosis present

## 2021-04-05 DIAGNOSIS — D6481 Anemia due to antineoplastic chemotherapy: Secondary | ICD-10-CM | POA: Diagnosis not present

## 2021-04-05 DIAGNOSIS — D696 Thrombocytopenia, unspecified: Secondary | ICD-10-CM

## 2021-04-05 DIAGNOSIS — E860 Dehydration: Secondary | ICD-10-CM

## 2021-04-05 DIAGNOSIS — G928 Other toxic encephalopathy: Secondary | ICD-10-CM | POA: Diagnosis present

## 2021-04-05 DIAGNOSIS — R570 Cardiogenic shock: Secondary | ICD-10-CM | POA: Diagnosis not present

## 2021-04-05 DIAGNOSIS — R6521 Severe sepsis with septic shock: Secondary | ICD-10-CM | POA: Diagnosis present

## 2021-04-05 DIAGNOSIS — C50411 Malignant neoplasm of upper-outer quadrant of right female breast: Secondary | ICD-10-CM

## 2021-04-05 DIAGNOSIS — C786 Secondary malignant neoplasm of retroperitoneum and peritoneum: Secondary | ICD-10-CM | POA: Diagnosis present

## 2021-04-05 DIAGNOSIS — C772 Secondary and unspecified malignant neoplasm of intra-abdominal lymph nodes: Secondary | ICD-10-CM | POA: Diagnosis present

## 2021-04-05 DIAGNOSIS — R6510 Systemic inflammatory response syndrome (SIRS) of non-infectious origin without acute organ dysfunction: Secondary | ICD-10-CM | POA: Diagnosis present

## 2021-04-05 LAB — RESPIRATORY PANEL BY PCR

## 2021-04-05 LAB — URINALYSIS, ROUTINE W REFLEX MICROSCOPIC
Bilirubin Urine: NEGATIVE
Glucose, UA: NEGATIVE mg/dL
Ketones, ur: 5 mg/dL — AB
Leukocytes,Ua: NEGATIVE
Nitrite: NEGATIVE
Protein, ur: 100 mg/dL — AB
Specific Gravity, Urine: 1.025 (ref 1.005–1.030)
pH: 5 (ref 5.0–8.0)

## 2021-04-05 LAB — CBC WITH DIFFERENTIAL/PLATELET
Abs Immature Granulocytes: 0.15 10*3/uL — ABNORMAL HIGH (ref 0.00–0.07)
Basophils Absolute: 0 10*3/uL (ref 0.0–0.1)
Basophils Relative: 0 %
Eosinophils Absolute: 0 10*3/uL (ref 0.0–0.5)
Eosinophils Relative: 0 %
HCT: 33.2 % — ABNORMAL LOW (ref 36.0–46.0)
Hemoglobin: 10.7 g/dL — ABNORMAL LOW (ref 12.0–15.0)
Immature Granulocytes: 1 %
Lymphocytes Relative: 9 %
Lymphs Abs: 1.1 10*3/uL (ref 0.7–4.0)
MCH: 29.6 pg (ref 26.0–34.0)
MCHC: 32.2 g/dL (ref 30.0–36.0)
MCV: 92 fL (ref 80.0–100.0)
Monocytes Absolute: 0.5 10*3/uL (ref 0.1–1.0)
Monocytes Relative: 4 %
Neutro Abs: 10.1 10*3/uL — ABNORMAL HIGH (ref 1.7–7.7)
Neutrophils Relative %: 86 %
Platelets: 110 10*3/uL — ABNORMAL LOW (ref 150–400)
RBC: 3.61 MIL/uL — ABNORMAL LOW (ref 3.87–5.11)
RDW: 14.1 % (ref 11.5–15.5)
WBC: 11.8 10*3/uL — ABNORMAL HIGH (ref 4.0–10.5)
nRBC: 1.9 % — ABNORMAL HIGH (ref 0.0–0.2)

## 2021-04-05 LAB — HCG, QUANTITATIVE, PREGNANCY: hCG, Beta Chain, Quant, S: 5 m[IU]/mL — ABNORMAL HIGH (ref <5)

## 2021-04-05 LAB — RESP PANEL BY RT-PCR (FLU A&B, COVID) ARPGX2
Influenza A by PCR: NEGATIVE
Influenza B by PCR: NEGATIVE
SARS Coronavirus 2 by RT PCR: NEGATIVE

## 2021-04-05 LAB — COMPREHENSIVE METABOLIC PANEL
ALT: 25 U/L (ref 0–44)
AST: 54 U/L — ABNORMAL HIGH (ref 15–41)
Albumin: 2.9 g/dL — ABNORMAL LOW (ref 3.5–5.0)
Alkaline Phosphatase: 259 U/L — ABNORMAL HIGH (ref 38–126)
Anion gap: 15 (ref 5–15)
BUN: 64 mg/dL — ABNORMAL HIGH (ref 6–20)
CO2: 20 mmol/L — ABNORMAL LOW (ref 22–32)
Calcium: 9.5 mg/dL (ref 8.9–10.3)
Chloride: 95 mmol/L — ABNORMAL LOW (ref 98–111)
Creatinine, Ser: 1.26 mg/dL — ABNORMAL HIGH (ref 0.44–1.00)
GFR, Estimated: 50 mL/min — ABNORMAL LOW (ref >60)
Glucose, Bld: 162 mg/dL — ABNORMAL HIGH (ref 70–99)
Potassium: 4.1 mmol/L (ref 3.5–5.1)
Sodium: 130 mmol/L — ABNORMAL LOW (ref 135–145)
Total Bilirubin: 1 mg/dL (ref 0.3–1.2)
Total Protein: 7.5 g/dL (ref 6.5–8.1)

## 2021-04-05 LAB — APTT: aPTT: 29 seconds (ref 24–36)

## 2021-04-05 LAB — LACTIC ACID, PLASMA
Lactic Acid, Venous: 3.8 mmol/L (ref 0.5–1.9)
Lactic Acid, Venous: 2.3 mmol/L (ref 0.5–1.9)
Lactic Acid, Venous: 2 mmol/L (ref 0.5–1.9)

## 2021-04-05 LAB — LIPASE, BLOOD: Lipase: 75 U/L — ABNORMAL HIGH (ref 11–51)

## 2021-04-05 LAB — PROTIME-INR
INR: 1.4 — ABNORMAL HIGH (ref 0.8–1.2)
Prothrombin Time: 16.8 seconds — ABNORMAL HIGH (ref 11.4–15.2)

## 2021-04-05 MED ORDER — SODIUM CHLORIDE 0.9 % IV SOLN
2.0000 g | Freq: Once | INTRAVENOUS | Status: AC
  Administered 2021-04-05: 2 g via INTRAVENOUS
  Filled 2021-04-05: qty 2

## 2021-04-05 MED ORDER — IOHEXOL 9 MG/ML PO SOLN
500.0000 mL | ORAL | Status: AC
  Administered 2021-04-05: 1000 mL via ORAL

## 2021-04-05 MED ORDER — SODIUM CHLORIDE 0.9 % IV SOLN: INTRAVENOUS | Status: DC

## 2021-04-05 MED ORDER — SODIUM CHLORIDE 0.9% FLUSH: 10.0000 mL | INTRAVENOUS | Status: DC | PRN

## 2021-04-05 MED ORDER — LACTATED RINGERS IV BOLUS
2000.0000 mL | Freq: Once | INTRAVENOUS | Status: AC
  Administered 2021-04-05: 2000 mL via INTRAVENOUS

## 2021-04-05 MED ORDER — METRONIDAZOLE 500 MG/100ML IV SOLN
500.0000 mg | Freq: Two times a day (BID) | INTRAVENOUS | Status: DC
  Administered 2021-04-05 (×2): 500 mg via INTRAVENOUS
  Filled 2021-04-05 (×2): qty 100

## 2021-04-05 MED ORDER — VITAMIN D 125 MCG (5000 UT) PO CAPS: 5000.0000 [IU] | ORAL_CAPSULE | Freq: Every morning | ORAL | Status: DC

## 2021-04-05 MED ORDER — OXYCODONE HCL 5 MG PO TABS
10.0000 mg | ORAL_TABLET | ORAL | Status: DC | PRN
  Administered 2021-04-05 – 2021-04-06 (×3): 10 mg via ORAL
  Filled 2021-04-05 (×4): qty 2

## 2021-04-05 MED ORDER — GUAIFENESIN 100 MG/5ML PO LIQD: 5.0000 mL | ORAL | Status: DC | PRN

## 2021-04-05 MED ORDER — PROCHLORPERAZINE EDISYLATE 10 MG/2ML IJ SOLN: 10.0000 mg | Freq: Four times a day (QID) | INTRAMUSCULAR | Status: DC | PRN

## 2021-04-05 MED ORDER — CHLORHEXIDINE GLUCONATE CLOTH 2 % EX PADS
6.0000 | MEDICATED_PAD | Freq: Every day | CUTANEOUS | Status: DC
  Administered 2021-04-05 – 2021-04-06 (×2): 6 via TOPICAL

## 2021-04-05 MED ORDER — HYDROMORPHONE HCL 1 MG/ML IJ SOLN: 0.5000 mg | INTRAMUSCULAR | Status: DC | PRN

## 2021-04-05 MED ORDER — B-12 5000 MCG PO CAPS: 5000.0000 ug | ORAL_CAPSULE | Freq: Every morning | ORAL | Status: DC

## 2021-04-05 MED ORDER — BARIUM SULFATE 2.1 % PO SUSP: 450.0000 mL | Freq: Two times a day (BID) | ORAL | Status: DC

## 2021-04-05 MED ORDER — OXYCODONE HCL 5 MG PO TABS
5.0000 mg | ORAL_TABLET | ORAL | Status: DC | PRN
  Administered 2021-04-05: 5 mg via ORAL
  Filled 2021-04-05: qty 1

## 2021-04-05 MED ORDER — ACETAMINOPHEN 650 MG RE SUPP: 650.0000 mg | Freq: Four times a day (QID) | RECTAL | Status: DC | PRN

## 2021-04-05 MED ORDER — MORPHINE SULFATE (PF) 4 MG/ML IV SOLN
4.0000 mg | Freq: Once | INTRAVENOUS | Status: AC
  Administered 2021-04-05: 4 mg via INTRAVENOUS
  Filled 2021-04-05: qty 1

## 2021-04-05 MED ORDER — VITAMIN D 25 MCG (1000 UNIT) PO TABS: 5000.0000 [IU] | ORAL_TABLET | Freq: Every day | ORAL | Status: DC

## 2021-04-05 MED ORDER — IOHEXOL 9 MG/ML PO SOLN
ORAL | Status: AC
  Filled 2021-04-05: qty 1000

## 2021-04-05 MED ORDER — HYDROMORPHONE HCL 1 MG/ML IJ SOLN
0.5000 mg | INTRAMUSCULAR | Status: DC | PRN
  Administered 2021-04-05 (×2): 0.5 mg via INTRAVENOUS
  Filled 2021-04-05 (×2): qty 0.5

## 2021-04-05 MED ORDER — FENTANYL CITRATE PF 50 MCG/ML IJ SOSY
50.0000 ug | PREFILLED_SYRINGE | Freq: Once | INTRAMUSCULAR | Status: AC
  Administered 2021-04-05: 50 ug via INTRAVENOUS
  Filled 2021-04-05: qty 1

## 2021-04-05 MED ORDER — ONDANSETRON HCL 4 MG/2ML IJ SOLN
4.0000 mg | Freq: Once | INTRAMUSCULAR | Status: AC
  Administered 2021-04-05: 4 mg via INTRAVENOUS
  Filled 2021-04-05: qty 2

## 2021-04-05 MED ORDER — ACETAMINOPHEN 325 MG PO TABS
650.0000 mg | ORAL_TABLET | Freq: Four times a day (QID) | ORAL | Status: DC | PRN
  Administered 2021-04-06: 04:00:00 650 mg via ORAL
  Filled 2021-04-05: qty 2

## 2021-04-05 MED ORDER — VITAMIN B-12 1000 MCG PO TABS: 5000.0000 ug | ORAL_TABLET | Freq: Every day | ORAL | Status: DC

## 2021-04-05 MED ORDER — SODIUM CHLORIDE 0.9% FLUSH
10.0000 mL | Freq: Two times a day (BID) | INTRAVENOUS | Status: DC
  Administered 2021-04-06: 22:00:00 10 mL
  Administered 2021-04-07: 09:00:00 30 mL

## 2021-04-05 MED ORDER — SODIUM CHLORIDE 0.9 % IV SOLN
2.0000 g | Freq: Two times a day (BID) | INTRAVENOUS | Status: DC
  Administered 2021-04-05 (×2): 2 g via INTRAVENOUS
  Filled 2021-04-05 (×3): qty 2

## 2021-04-05 NOTE — ED Notes (Signed)
Patient transported to CT 

## 2021-04-05 NOTE — Progress Notes (Signed)
°   04/03/2021 1059  Assess: MEWS Score  Temp 97.7 F (36.5 C)  BP (!) 99/38  Pulse Rate (!) 128  Resp 20  Level of Consciousness Alert  SpO2 92 %  O2 Device Room Air  Assess: MEWS Score  MEWS Temp 0  MEWS Systolic 1  MEWS Pulse 2  MEWS RR 0  MEWS LOC 0  MEWS Score 3  MEWS Score Color Yellow  Assess: if the MEWS score is Yellow or Red  Were vital signs taken at a resting state? Yes  Focused Assessment No change from prior assessment  Does the patient meet 2 or more of the SIRS criteria? Yes  Does the patient have a confirmed or suspected source of infection? No  MEWS guidelines implemented *See Row Information* Yes  Treat  MEWS Interventions Administered scheduled meds/treatments  Pain Scale 0-10  Pain Score 10  Pain Type Acute pain  Pain Location Generalized  Pain Descriptors / Indicators Discomfort  Pain Frequency Constant  Pain Onset On-going  Patients Stated Pain Goal 4  Pain Intervention(s) RN made aware;Repositioned;MD notified (Comment)  Multiple Pain Sites No  Take Vital Signs  Increase Vital Sign Frequency  Yellow: Q 2hr X 2 then Q 4hr X 2, if remains yellow, continue Q 4hrs  Escalate  MEWS: Escalate Yellow: discuss with charge nurse/RN and consider discussing with provider and RRT  Notify: Charge Nurse/RN  Name of Charge Nurse/RN Notified Barron Schmid RN  Date Charge Nurse/RN Notified 03/28/2021  Time Charge Nurse/RN Notified 1100  Assess: SIRS CRITERIA  SIRS Temperature  0  SIRS Pulse 1  SIRS Respirations  0  SIRS WBC 0  SIRS Score Sum  1     Patient admitted from ED, MEWS yellow on admission.  Frequent VS algorithm initiated.  No change since in ED, will continue to monitor

## 2021-04-05 NOTE — H&P (Signed)
History and Physical    [name]:[id number] DOB: 02-19-1966 DOA: 03/28/2021  PCP: [name], MD  Patient coming from: Home  Chief Complaint: weak  HPI: [name] is a 56 y.o. female with medical history significant of breast cancer with bone mets, biliary obstruction w/ stent and drain placement, chornic pain, HTN. Presenting with generalized weakness. She reports that she's had a week of dry cough. She tried mucinex and that didn't help. Her symptoms progressed through yesterday to include weakness. She felt exceeding fatigue throughout the day that was complicated by nausea. She found herself unable to mover herself around. Her mom found her on the floor. She became concerned and called for EMS.    ED Course: She was noted to have an AKI. She was tachycardic and her lactic acid was elevated. CXR was negative. She was started on cefepime. [facility] was called for admission.   Review of Systems:  Denies CP, palpitations, diarrhea, fever, sick contacts, dyspnea. Reports N/V, weakness, constipation. Review of systems is otherwise negative for all not mentioned in HPI.   PMHx Past Medical History:  Diagnosis Date   Allergy    Arthritis 2018   hands, left knee   Breast cancer ([place]) 03-27-2020   right breast IMC   Eczema    Family history of breast cancer    Family history of lung cancer    Family history of prostate cancer    Hypertension     PSHx Past Surgical History:  Procedure Laterality Date   ABDOMINAL HYSTERECTOMY     CESAREAN SECTION     ERCP N/A 02/02/2021   Procedure: ENDOSCOPIC RETROGRADE CHOLANGIOPANCREATOGRAPHY (ERCP);  Surgeon: Carol Ada, MD;  Location: [facility];  Service: Endoscopy;  Laterality: N/A;   ESOPHAGOGASTRODUODENOSCOPY N/A 02/09/2021   Procedure: ESOPHAGOGASTRODUODENOSCOPY (EGD);  Surgeon: Carol Ada, MD;  Location: [facility];  Service: Endoscopy;  Laterality: N/A;   IR BILIARY DRAIN PLACEMENT WITH CHOLANGIOGRAM  02/03/2021    IR ENDOLUMINAL BX OF BILIARY TREE  02/10/2021   IR EXCHANGE BILIARY DRAIN  02/10/2021   KNEE ARTHROSCOPY W/ ACL RECONSTRUCTION Left    KNEE SURGERY     MODIFIED MASTECTOMY Right 12/07/2020   Procedure: RIGHT MODIFIED RADICAL MASTECTOMY;  Surgeon: Rolm Bookbinder, MD;  Location: [facility];  Service: General;  Laterality: Right;   PORTACATH PLACEMENT Left 04/23/2020   Procedure: LEFT SIDE PORT PLACEMENT WITH ULTRASOUND GUIDANCE;  Surgeon: Rolm Bookbinder, MD;  Location: [facility];  Service: General;  Laterality: Left;  230PM START TIME PLEASE ROOM 2 THANKS   SPHINCTEROTOMY  02/02/2021   Procedure: SPHINCTEROTOMY;  Surgeon: Carol Ada, MD;  Location: [facility];  Service: Endoscopy;;   TUBAL LIGATION     UPPER ESOPHAGEAL ENDOSCOPIC ULTRASOUND (EUS) N/A 02/09/2021   Procedure: UPPER ESOPHAGEAL ENDOSCOPIC ULTRASOUND (EUS);  Surgeon: Carol Ada, MD;  Location: [facility];  Service: Endoscopy;  Laterality: N/A;    SocHx  reports that she quit smoking about 3 years ago. Her smoking use included cigarettes. She has a 9.00 pack-year smoking history. She has never used smokeless tobacco. She reports current alcohol use of about 3.0 standard drinks per week. She reports that she does not use drugs.  No Known Allergies  FamHx Family History  Problem Relation Age of Onset   Breast cancer Mother 49   Arthritis Mother    Diabetes Father    Kidney disease Father    Hypertension Sister    Heart disease Maternal Grandmother  Breast cancer Paternal Grandmother        dx >50   Heart disease Maternal Aunt    Breast cancer Maternal Aunt 77   Other Maternal Aunt        brain tumor (not cancerous)   Lung cancer Maternal Aunt    Prostate cancer Cousin 56       localized    Prior to Admission medications   Medication Sig Start Date End Date Taking? Authorizing Provider  acetaminophen (TYLENOL) 500 MG tablet Take 1,000 mg by mouth every 6 (six) hours as  needed for moderate pain.   Yes [provider]  capecitabine (XELODA) 500 MG tablet Take 4 tablets (2,000 mg total) by mouth 2 (two) times daily after a meal. Take on days 1-14. Repeat every 21 days. 03/25/21  Yes Benay Pike, MD  Cholecalciferol (VITAMIN D) 125 MCG (5000 UT) CAPS Take 5,000 Units by mouth every morning.   Yes [provider]  clobetasol ointment (TEMOVATE) 1.17 % Apply 1 application topically 2 (two) times daily as needed (eczema).   Yes [provider]  ondansetron (ZOFRAN) 8 MG tablet Take 1 tablet (8 mg total) by mouth 2 (two) times daily as needed (Nausea or vomiting). 03/23/21  Yes Benay Pike, MD  oxyCODONE (OXY IR/ROXICODONE) 5 MG immediate release tablet Take 1 tablet (5 mg total) by mouth every 4 (four) hours as needed for severe pain. 03/23/21  Yes Causey, Charlestine Massed, NP  prochlorperazine (COMPAZINE) 10 MG tablet Take 1 tablet (10 mg total) by mouth every 6 (six) hours as needed (Nausea or vomiting). 03/23/21  Yes Benay Pike, MD  propranolol (INDERAL) 10 MG tablet Take 10 mg by mouth 2 (two) times daily. 06/08/20  Yes [provider]  spironolactone (ALDACTONE) 25 MG tablet Take 25 mg by mouth 2 (two) times daily. 08/14/20  Yes [provider]  Boric Acid Vaginal 600 MG SUPP Place 600 mg vaginally daily as needed (yeast infections). Patient not taking: Reported on 03/15/2021    [provider]  Cyanocobalamin (B-12) 5000 MCG CAPS Take 5,000 mcg by mouth every morning.    [provider]  loperamide (IMODIUM) 2 MG capsule Take 2 tablets after first diarrheal bowel movement, then one tablet after each following diarrheal movement; maximum 6 tablets/ day Patient taking differently: 2-4 mg See admin instructions. Take 2 tablets  (4 mg) by mouth after first diarrheal bowel movement, then one tablet (2 mg) after each following diarrheal movement; maximum 6 tablets/ day 10/22/20   Magrinat, Virgie Dad, MD   methocarbamol (ROBAXIN) 500 MG tablet Take 1 tablet (500 mg total) by mouth every 6 (six) hours as needed for muscle spasms. Patient not taking: Reported on 03/25/2021 12/08/20   Rolm Bookbinder, MD  neomycin-bacitracin-polymyxin (NEOSPORIN) OINT Apply 1 application topically 2 (two) times daily. Patient not taking: Reported on 03/18/2021 02/13/21   Kayleen Memos, DO  buPROPion (WELLBUTRIN XL) 300 MG 24 hr tablet Take 1 tablet (300 mg total) by mouth every morning. 10/04/13 02/17/20  [name], MD  losartan-hydrochlorothiazide [facility]) 100-12.5 MG per tablet Take 1/2 - 1 tab daily 10/04/13 02/17/20  Zanard, Bernadene Bell, MD  telmisartan-hydrochlorothiazide (MICARDIS HCT) 80-25 MG tablet Take 1 tablet by mouth daily.  02/17/20  [provider]    Physical Exam: Vitals:   03/23/2021 0530 03/16/2021 0600 03/24/2021 0615 04/13/2021 0645  BP: 112/84 116/85 107/82 (!) 112/92  Pulse: (!) 125 (!) 122 (!) 125 (!) 125  Resp: (!) 22 [place])  '24 20 15  ' Temp:      TempSrc:      SpO2: 98% 99% 100% 96%  Weight:      Height:        General: 56 y.o. female resting in bed in NAD Eyes: PERRL, normal sclera ENMT: Nares patent w/o discharge, orophaynx clear, dentition normal, ears w/o discharge/lesions/ulcers Neck: Supple, trachea midline Cardiovascular: tachy, +S1, S2, no m/g/r, equal pulses throughout Respiratory: CTABL, no w/r/r, normal WOB GI: BS+, ND, RUQ TTP, no masses noted, no organomegaly noted, biliary drain noted w/ green fluid in bag MSK: No c/c; BLE 2+ pitting edema Neuro: A&O x 3, no focal deficits Psyc: Appropriate interaction and affect, calm/cooperative  Labs on Admission: I have personally reviewed following labs and imaging studies  CBC: Recent Labs  Lab 03/31/2021 0024  WBC 11.8*  NEUTROABS 10.1*  HGB 10.7*  HCT 33.2*  MCV 92.0  PLT 209*   Basic Metabolic Panel: Recent Labs  Lab 03/28/2021 0024  NA 130*  K 4.1  CL 95*  CO2 20*  GLUCOSE 162*  BUN 64*  CREATININE 1.26*   CALCIUM 9.5   GFR: Estimated Creatinine Clearance: 54.6 mL/min (A) (by C-G formula based on SCr of 1.26 mg/dL (H)). Liver Function Tests: Recent Labs  Lab 04/01/2021 0024  AST 54*  ALT 25  ALKPHOS 259*  BILITOT 1.0  PROT 7.5  ALBUMIN 2.9*   No results for input(s): LIPASE, AMYLASE in the last 168 hours. No results for input(s): AMMONIA in the last 168 hours. Coagulation Profile: Recent Labs  Lab 03/30/2021 0024  INR 1.4*   Cardiac Enzymes: No results for input(s): CKTOTAL, CKMB, CKMBINDEX, TROPONINI in the last 168 hours. BNP (last 3 results) No results for input(s): PROBNP in the last 8760 hours. HbA1C: No results for input(s): HGBA1C in the last 72 hours. CBG: No results for input(s): GLUCAP in the last 168 hours. Lipid Profile: No results for input(s): CHOL, HDL, LDLCALC, TRIG, CHOLHDL, LDLDIRECT in the last 72 hours. Thyroid Function Tests: No results for input(s): TSH, T4TOTAL, FREET4, T3FREE, THYROIDAB in the last 72 hours. Anemia Panel: No results for input(s): VITAMINB12, FOLATE, FERRITIN, TIBC, IRON, RETICCTPCT in the last 72 hours. Urine analysis:    Component Value Date/Time   COLORURINE YELLOW 04/08/2021 0024   APPEARANCEUR HAZY (A) 04/03/2021 0024   LABSPEC 1.025 03/24/2021 0024   PHURINE 5.0 04/13/2021 0024   GLUCOSEU NEGATIVE 03/31/2021 0024   HGBUR SMALL (A) 03/31/2021 0024   BILIRUBINUR NEGATIVE 03/20/2021 0024   KETONESUR 5 (A) 04/10/2021 0024   PROTEINUR 100 (A) 04/03/2021 0024   NITRITE NEGATIVE 03/15/2021 0024   LEUKOCYTESUR NEGATIVE 04/03/2021 0024    Radiological Exams on Admission: DG Chest Port 1 View  Result Date: 03/27/2021 CLINICAL DATA:  Sepsis EXAM: PORTABLE CHEST 1 VIEW COMPARISON:  CT 12/29/2020 FINDINGS: Mild elevation of the right hemidiaphragm is unchanged. Lungs are clear. No pneumothorax or pleural effusion. Cardiac size within normal limits. Left internal jugular chest port tip seen within the superior cavoatrial junction.  Sclerotic metastasis with associated pathologic fracture noted within the right fourth and sixth ribs. Healed left fourth rib fracture. No acute bone abnormality. IMPRESSION: No active disease. Electronically Signed   By: Fidela Salisbury M.D.   On: 03/30/2021 02:04    EKG: Independently reviewed. Sinus tach, no st elevations  Assessment/Plan Sepsis of unknown source     - admit to inpt, progressive     - she reports that she's had increased outpt from  the biliary drain; will check CT ab/pelvis     - for now continue abx as cefepime, flagyl     - continue fluids     - follow blood Cx     - UA is negative, CXR is negative     - trend lactic acid  AKI Dehydration     - fluids, watch nephrotoxins     - follow I&O     - holding diuretic as she is also on the hypotensive side  Elevated alk phos Mildly elevated INR     - checking CT ab/pelvis     - no signs of bleed, trend INR for now  Normocytic anemia Thrombocytopenia     - Hgb is at baseline; plts are down     - PBS     - follow for now  H/o HTN     - she is on the hypotensive side, hold home regimen for now  DVT prophylaxis: SCDs  Code Status: FULL  Family Communication: w/ mom at bedside  Consults called: None   Status is: Inpatient  Remains inpatient appropriate because: severity of illness  Jonnie Finner DO [facility]  If 7PM-7AM, please contact night-coverage [email or web address]  03/15/2021, 7:34 AM

## 2021-04-05 NOTE — ED Provider Notes (Signed)
[facility] Emergency Department Provider Note MRN:  [id number]  Arrival date & time: 03/22/2021     Chief Complaint   Weakness   History of Present Illness   [name] is a 56 y.o. year-old female with a history of breast cancer presenting to the ED with chief complaint of weakness.  Generalized weakness, lack of appetite, back pain.  Metastatic breast cancer recently finished radiation in December, currently taking chemotherapy meds.  Review of Systems  A thorough review of systems was obtained and all systems are negative except as noted in the HPI and PMH.   Patient's Health History    Past Medical History:  Diagnosis Date   Allergy    Arthritis 2018   hands, left knee   Breast cancer ([facility]) 03-27-2020   right breast IMC   Eczema    Family history of breast cancer    Family history of lung cancer    Family history of prostate cancer    Hypertension     Past Surgical History:  Procedure Laterality Date   ABDOMINAL HYSTERECTOMY     CESAREAN SECTION     ERCP N/A 02/02/2021   Procedure: ENDOSCOPIC RETROGRADE CHOLANGIOPANCREATOGRAPHY (ERCP);  Surgeon: Carol Ada, MD;  Location: [facility];  Service: Endoscopy;  Laterality: N/A;   ESOPHAGOGASTRODUODENOSCOPY N/A 02/09/2021   Procedure: ESOPHAGOGASTRODUODENOSCOPY (EGD);  Surgeon: Carol Ada, MD;  Location: [facility];  Service: Endoscopy;  Laterality: N/A;   IR BILIARY DRAIN PLACEMENT WITH CHOLANGIOGRAM  02/03/2021   IR ENDOLUMINAL BX OF BILIARY TREE  02/10/2021   IR EXCHANGE BILIARY DRAIN  02/10/2021   KNEE ARTHROSCOPY W/ ACL RECONSTRUCTION Left    KNEE SURGERY     MODIFIED MASTECTOMY Right 12/07/2020   Procedure: RIGHT MODIFIED RADICAL MASTECTOMY;  Surgeon: Rolm Bookbinder, MD;  Location: [place];  Service: General;  Laterality: Right;   PORTACATH PLACEMENT Left 04/23/2020   Procedure: LEFT SIDE PORT PLACEMENT WITH ULTRASOUND GUIDANCE;  Surgeon: Rolm Bookbinder,  MD;  Location: [place];  Service: General;  Laterality: Left;  230PM START TIME PLEASE ROOM 2 THANKS   SPHINCTEROTOMY  02/02/2021   Procedure: SPHINCTEROTOMY;  Surgeon: Carol Ada, MD;  Location: [facility];  Service: Endoscopy;;   TUBAL LIGATION     UPPER ESOPHAGEAL ENDOSCOPIC ULTRASOUND (EUS) N/A 02/09/2021   Procedure: UPPER ESOPHAGEAL ENDOSCOPIC ULTRASOUND (EUS);  Surgeon: Carol Ada, MD;  Location: [facility];  Service: Endoscopy;  Laterality: N/A;    Family History  Problem Relation Age of Onset   Breast cancer Mother 7   Arthritis Mother    Diabetes Father    Kidney disease Father    Hypertension Sister    Heart disease Maternal Grandmother    Breast cancer Paternal Grandmother        dx >50   Heart disease Maternal Aunt    Breast cancer Maternal Aunt 77   Other Maternal Aunt        brain tumor (not cancerous)   Lung cancer Maternal Aunt    Prostate cancer Cousin 17       localized    Social History   Socioeconomic History   Marital status: Divorced    Spouse name: Not on file   Number of children: Not on file   Years of education: Not on file   Highest education level: Not on file  Occupational History   Not on file  Tobacco Use   Smoking status: Former    Packs/day: 0.25  Years: 36.00    Pack years: 9.00    Types: Cigarettes    Quit date: 04/22/2017    Years since quitting: 3.9   Smokeless tobacco: Never   Tobacco comments:    She is trying to quit.    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  She has smoked for > 20 years.  She is trying to quit and is down to about 3-5 cigs per day.     Alcohol Use: 4 days/months  (3 drinks)    Drug Use:  None   Diet:  Regular   Exercise:  Walking/ Zumba/Basketball, Biking   Hobbies: Dancing/ Cooking                Social Determinants of Health   Financial Resource Strain: Not on file  Food Insecurity: No Food Insecurity   Worried About Charity fundraiser in the Last Year: Never true   Ran Out of Food in the Last Year: Never true  Transportation Needs: No Transportation Needs   Lack of Transportation (Medical): No   Lack of Transportation (Non-Medical): No  Physical Activity: Not on file  Stress: Not on file  Social Connections: Not on file  Intimate Partner Violence: Not on file     Physical Exam   Vitals:   03/25/2021 0430 04/02/2021 0445  BP: 112/85 120/90  Pulse: (!) 123 (!) 123  Resp: (!) 23 (!) 23  Temp:    SpO2: 97% 97%    CONSTITUTIONAL: Ill-appearing, NAD NEURO/PSYCH:  Alert and oriented x 3, no focal deficits EYES:  eyes equal and reactive ENT/NECK:  no LAD, no JVD CARDIO: Tachycardic rate, well-perfused, normal S1 and S2 PULM:  CTAB no wheezing or rhonchi GI/GU:  non-distended, non-tender MSK/SPINE:  No gross deformities, no edema SKIN:  no rash, atraumatic   *Additional and/or pertinent findings included in MDM below  Diagnostic and Interventional Summary    EKG Interpretation  Date/Time:  Monday April 05 2021 00:41:45 EST Ventricular Rate:  135 PR Interval:  102 QRS Duration: 85 QT Interval:  270 QTC Calculation: 405 R Axis:   66 Text Interpretation: Sinus tachycardia Probable LVH with secondary repol abnrm Confirmed by Gerlene Fee [phone]) on 03/26/2021 4:52:47 AM       Labs Reviewed  LACTIC ACID, PLASMA - Abnormal; Notable for the following components:      Result Value   Lactic  Acid, Venous 3.8 (*)    All other components within normal limits  COMPREHENSIVE METABOLIC PANEL - Abnormal; Notable for the following components:   Sodium 130 (*)    Chloride 95 (*)    CO2 20 (*)    Glucose, Bld 162 (*)    BUN 64 (*)    Creatinine, Ser 1.26 (*)    Albumin 2.9 (*)  AST 54 (*)    Alkaline Phosphatase 259 (*)    GFR, Estimated 50 (*)    All other components within normal limits  CBC WITH DIFFERENTIAL/PLATELET - Abnormal; Notable for the following components:   WBC 11.8 (*)    RBC 3.61 (*)    Hemoglobin 10.7 (*)    HCT 33.2 (*)    Platelets 110 (*)    nRBC 1.9 (*)    Neutro Abs 10.1 (*)    Abs Immature Granulocytes 0.15 (*)    All other components within normal limits  PROTIME-INR - Abnormal; Notable for the following components:   Prothrombin Time 16.8 (*)    INR 1.4 (*)    All other components within normal limits  URINALYSIS, ROUTINE W REFLEX MICROSCOPIC - Abnormal; Notable for the following components:   APPearance HAZY (*)    Hgb urine dipstick SMALL (*)    Ketones, ur 5 (*)    Protein, ur 100 (*)    Bacteria, UA RARE (*)    All other components within normal limits  RESP PANEL BY RT-PCR (FLU A&B, COVID) ARPGX2  CULTURE, BLOOD (ROUTINE X 2)  CULTURE, BLOOD (ROUTINE X 2)  URINE CULTURE  APTT  LACTIC ACID, PLASMA  HCG, QUANTITATIVE, PREGNANCY    DG Chest Port 1 View  Final Result      Medications  lactated ringers bolus 2,000 mL (0 mLs Intravenous Stopped 03/23/2021 0155)  ceFEPIme (MAXIPIME) 2 g in sodium chloride 0.9 % 100 mL IVPB (0 g Intravenous Stopped 04/01/2021 0155)  fentaNYL (SUBLIMAZE) injection 50 mcg (50 mcg Intravenous Given 04/08/2021 0049)  ondansetron (ZOFRAN) injection 4 mg (4 mg Intravenous Given 04/11/2021 0049)  morphine 4 MG/ML injection 4 mg (4 mg Intravenous Given 03/14/2021 0400)     Procedures  /  Critical Care .Critical Care Performed by: Maudie Flakes, MD Authorized by: Maudie Flakes, MD   Critical care provider statement:     Critical care time (minutes):  35   Critical care was necessary to treat or prevent imminent or life-threatening deterioration of the following conditions:  Sepsis   Critical care was time spent personally by me on the following activities:  Development of treatment plan with patient or surrogate, discussions with consultants, evaluation of patient's response to treatment, examination of patient, ordering and review of laboratory studies, ordering and review of radiographic studies, ordering and performing treatments and interventions, pulse oximetry, re-evaluation of patient's condition and review of old charts  ED Course and Medical Decision Making  Initial Impression and Ddx Concern for sepsis, side effect of chemotherapy, electrolyte disturbance, pain related to bony metastases.  Patient is tachycardic, soft blood pressure, oral temp 99.5.  Code sepsis initiated.  Past medical/surgical history that increases complexity of ED encounter: Metastatic breast cancer  Interpretation of Diagnostics I personally reviewed the EKG and my interpretation is as follows: Sinus tachycardia, no significant change from prior    Labs revealing an elevated lactic acid, elevated creatinine and BUN.  Suspect dehydration with AKI.  Patient Reassessment and Ultimate Disposition/Management Patient's heart rate is improved with fluids, still tachycardic however this seems to be near her recent baseline.  Unsure if patient has underlying sepsis or is simply dehydrated, has been having very poor p.o. intake per mother at bedside.  Will admit to medicine.  Patient management required discussion with the following services or consulting groups:  Hospitalist Service  Complexity of Problems Addressed Acute illness or injury that poses threat of life of bodily  function  Additional Data Reviewed and Analyzed Further history obtained from: Further history from spouse/family member  Factors Impacting ED Encounter  Risk Consideration of hospitalization  Barth Kirks. [place] Small, [place] [email or web address]  Final Clinical Impressions(s) / ED Diagnoses     ICD-10-CM   1. Dehydration  E86.0     2. AKI (acute kidney injury) ([place])  N17.9       ED Discharge Orders     None        Discharge Instructions Discussed with and Provided to Patient:   Discharge Instructions   None      Maudie Flakes, MD 03/28/2021 [phone]

## 2021-04-05 NOTE — Progress Notes (Signed)
Only 50cc UOP drained since foley palcement - Dr. Marylyn Ishihara made aware - no new orders at this time, continue strict I&Os

## 2021-04-05 NOTE — Progress Notes (Signed)
Opened in error

## 2021-04-05 NOTE — Consult Note (Signed)
Urology Consult   Reason for consult: hydronephrosis in the setting of AKI  History of Present Illness: [name] is a 56 y.o. who presented to the ED earlier today with generalized weakness. Her evaluation in the ED was concerning for sepsis and she was started on broad spectrum abx. [name] was also found to have an elevated Cr in the setting of an elevated lactate, soft Bps and increased HR. Additionally a CT scan was obtain demonstrating bilateral hydronephrosis.   At the time of my exam, [name] endorsed generalized abdominal pain. A foley was placed on her admission and has put out 50 cc during day shift.   I reviewed her CT scan from this admission.  The right hydronephrosis is approximately stable.  There is some mild left hydronephrosis not present on the CT scan from November.  [name] was seen by urology as an inpatient in November to evaluate her hydronephrosis.  Ultimately she was asymptomatic and her renal function stabilized so any intervention was deferred at that time.  She had plans to follow-up in approximately 2 weeks to discuss ureteral stent placement with [name].  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   Eczema    Family history of breast cancer    Family history of lung cancer    Family history of prostate cancer    Hypertension     Past Surgical History:  Procedure Laterality Date   ABDOMINAL HYSTERECTOMY     CESAREAN SECTION     ERCP N/A 02/02/2021   Procedure: ENDOSCOPIC RETROGRADE CHOLANGIOPANCREATOGRAPHY (ERCP);  Surgeon: Carol Ada, MD;  Location: [facility];  Service: Endoscopy;  Laterality: N/A;   ESOPHAGOGASTRODUODENOSCOPY N/A 02/09/2021   Procedure: ESOPHAGOGASTRODUODENOSCOPY (EGD);  Surgeon: Carol Ada, MD;  Location: [facility];  Service: Endoscopy;  Laterality: N/A;   IR BILIARY DRAIN PLACEMENT WITH CHOLANGIOGRAM  02/03/2021    IR ENDOLUMINAL BX OF BILIARY TREE  02/10/2021   IR EXCHANGE BILIARY DRAIN  02/10/2021   KNEE ARTHROSCOPY W/ ACL RECONSTRUCTION Left    KNEE SURGERY     MODIFIED MASTECTOMY Right 12/07/2020   Procedure: RIGHT MODIFIED RADICAL MASTECTOMY;  Surgeon: Rolm Bookbinder, MD;  Location: [facility];  Service: General;  Laterality: Right;   PORTACATH PLACEMENT Left 04/23/2020   Procedure: LEFT SIDE PORT PLACEMENT WITH ULTRASOUND GUIDANCE;  Surgeon: Rolm Bookbinder, MD;  Location: [facility];  Service: General;  Laterality: Left;  230PM START TIME PLEASE ROOM 2 THANKS   SPHINCTEROTOMY  02/02/2021   Procedure: SPHINCTEROTOMY;  Surgeon: Carol Ada, MD;  Location: [facility];  Service: Endoscopy;;   TUBAL LIGATION     UPPER ESOPHAGEAL ENDOSCOPIC ULTRASOUND (EUS) N/A 02/09/2021   Procedure: UPPER ESOPHAGEAL ENDOSCOPIC ULTRASOUND (EUS);  Surgeon: Carol Ada, MD;  Location: [facility];  Service: Endoscopy;  Laterality: N/A;     Current Hospital Medications:  Home meds:  No current facility-administered medications on file prior to encounter.   Current Outpatient Medications on File Prior to Encounter  Medication Sig Dispense Refill   acetaminophen (TYLENOL) 500 MG tablet Take 1,000 mg by mouth every 6 (six) hours as needed for moderate pain.     capecitabine (XELODA) 500 MG tablet Take 4 tablets (2,000 mg total) by mouth 2 (two) times daily after a meal. Take on days 1-14. Repeat every 21 days. 112 tablet 4  Cholecalciferol (VITAMIN D) 125 MCG (5000 UT) CAPS Take 5,000 Units by mouth every morning.     clobetasol ointment (TEMOVATE) 8.10 % Apply 1 application topically 2 (two) times daily as needed (eczema).     ondansetron (ZOFRAN) 8 MG tablet Take 1 tablet (8 mg total) by mouth 2 (two) times daily as needed (Nausea or vomiting). 30 tablet 1   oxyCODONE (OXY IR/ROXICODONE) 5 MG immediate release tablet Take 1 tablet (5 mg total) by mouth every 4 (four)  hours as needed for severe pain. 60 tablet 0   prochlorperazine (COMPAZINE) 10 MG tablet Take 1 tablet (10 mg total) by mouth every 6 (six) hours as needed (Nausea or vomiting). 30 tablet 1   propranolol (INDERAL) 10 MG tablet Take 10 mg by mouth 2 (two) times daily.     spironolactone (ALDACTONE) 25 MG tablet Take 25 mg by mouth 2 (two) times daily.     Boric Acid Vaginal 600 MG SUPP Place 600 mg vaginally daily as needed (yeast infections). (Patient not taking: Reported on 04/02/2021)     Cyanocobalamin (B-12) 5000 MCG CAPS Take 5,000 mcg by mouth every morning.     loperamide (IMODIUM) 2 MG capsule Take 2 tablets after first diarrheal bowel movement, then one tablet after each following diarrheal movement; maximum 6 tablets/ day (Patient taking differently: 2-4 mg See admin instructions. Take 2 tablets  (4 mg) by mouth after first diarrheal bowel movement, then one tablet (2 mg) after each following diarrheal movement; maximum 6 tablets/ day) 60 capsule 6   methocarbamol (ROBAXIN) 500 MG tablet Take 1 tablet (500 mg total) by mouth every 6 (six) hours as needed for muscle spasms. (Patient not taking: Reported on 03/29/2021) 30 tablet 1   neomycin-bacitracin-polymyxin (NEOSPORIN) OINT Apply 1 application topically 2 (two) times daily. (Patient not taking: Reported on 03/17/2021) 60 g 0   [DISCONTINUED] buPROPion (WELLBUTRIN XL) 300 MG 24 hr tablet Take 1 tablet (300 mg total) by mouth every morning. 30 tablet 5   [DISCONTINUED] losartan-hydrochlorothiazide (HYZAAR) 100-12.5 MG per tablet Take 1/2 - 1 tab daily 30 tablet 5   [DISCONTINUED] telmisartan-hydrochlorothiazide (MICARDIS HCT) 80-25 MG tablet Take 1 tablet by mouth daily.       Scheduled Meds:  Barium Sulfate  450 mL Oral BID   cholecalciferol  5,000 Units Oral Daily   iohexol       [START ON 03/18/2021] vitamin B-12  5,000 mcg Oral Daily   Continuous Infusions:  sodium chloride 125 mL/hr at 04/12/2021 1126   ceFEPime (MAXIPIME) IV      metronidazole     PRN Meds:.acetaminophen **OR** acetaminophen, guaiFENesin, HYDROmorphone (DILAUDID) injection, oxyCODONE, prochlorperazine  Allergies: No Known Allergies  Family History  Problem Relation Age of Onset   Breast cancer Mother 50   Arthritis Mother    Diabetes Father    Kidney disease Father    Hypertension Sister    Heart disease Maternal Grandmother    Breast cancer Paternal Grandmother        dx >50   Heart disease Maternal Aunt    Breast cancer Maternal Aunt 77   Other Maternal Aunt        brain tumor (not cancerous)   Lung cancer Maternal Aunt    Prostate cancer Cousin 56       localized    Social History:  reports that she quit smoking about 3 years ago. Her smoking use included cigarettes. She has a 9.00 pack-year smoking history. She has never used  smokeless tobacco. She reports current alcohol use of about 3.0 standard drinks per week. She reports that she does not use drugs.  ROS: A complete review of systems was performed.  All systems are negative except for pertinent findings as noted.  Physical Exam:  Vital signs in last 24 hours: Temp:  [97.7 F (36.5 C)-99.5 F (37.5 C)] 97.7 F (36.5 C) (01/23 1059) Pulse Rate:  [122-148] 128 (01/23 1059) Resp:  [15-30] 20 (01/23 1059) BP: (91-134)/(38-97) 99/38 (01/23 1059) SpO2:  [92 %-100 %] 92 % (01/23 1059) Weight:  [78.7 kg] 78.7 kg (01/23 0125) Constitutional:  Alert and oriented, No acute distress Cardiovascular: Regular rate and rhythm Respiratory: Normal respiratory effort, Lungs clear bilaterally GI: Abdomen is soft, nontender, nondistended, no abdominal masses GU: No CVA tenderness Neurologic: Grossly intact, no focal deficits Psychiatric: Normal mood and affect Foley in place with a small amount of clear yellow urine  Laboratory Data:  Recent Labs    04/04/2021 0024  WBC 11.8*  HGB 10.7*  HCT 33.2*  PLT 110*    Recent Labs    04/12/2021 0024  NA 130*  K 4.1  CL 95*  GLUCOSE  162*  BUN 64*  CALCIUM 9.5  CREATININE 1.26*     Results for orders placed or performed during the hospital encounter of 04/01/2021 (from the past 24 hour(s))  Resp Panel by RT-PCR (Flu A&B, Covid) Nasopharyngeal Swab     Status: None   Collection Time: 04/09/2021 12:24 AM   Specimen: Nasopharyngeal Swab; Nasopharyngeal(NP) swabs in vial transport medium  Result Value Ref Range   SARS Coronavirus 2 by RT PCR NEGATIVE NEGATIVE   Influenza A by PCR NEGATIVE NEGATIVE   Influenza B by PCR NEGATIVE NEGATIVE  Comprehensive metabolic panel     Status: Abnormal   Collection Time: 03/29/2021 12:24 AM  Result Value Ref Range   Sodium 130 (L) 135 - 145 mmol/L   Potassium 4.1 3.5 - 5.1 mmol/L   Chloride 95 (L) 98 - 111 mmol/L   CO2 20 (L) 22 - 32 mmol/L   Glucose, Bld 162 (H) 70 - 99 mg/dL   BUN 64 (H) 6 - 20 mg/dL   Creatinine, Ser 1.26 (H) 0.44 - 1.00 mg/dL   Calcium 9.5 8.9 - 10.3 mg/dL   Total Protein 7.5 6.5 - 8.1 g/dL   Albumin 2.9 (L) 3.5 - 5.0 g/dL   AST 54 (H) 15 - 41 U/L   ALT 25 0 - 44 U/L   Alkaline Phosphatase 259 (H) 38 - 126 U/L   Total Bilirubin 1.0 0.3 - 1.2 mg/dL   GFR, Estimated 50 (L) >60 mL/min   Anion gap 15 5 - 15  CBC WITH DIFFERENTIAL     Status: Abnormal   Collection Time: 03/29/2021 12:24 AM  Result Value Ref Range   WBC 11.8 (H) 4.0 - 10.5 K/uL   RBC 3.61 (L) 3.87 - 5.11 MIL/uL   Hemoglobin 10.7 (L) 12.0 - 15.0 g/dL   HCT 33.2 (L) 36.0 - 46.0 %   MCV 92.0 80.0 - 100.0 fL   MCH 29.6 26.0 - 34.0 pg   MCHC 32.2 30.0 - 36.0 g/dL   RDW 14.1 11.5 - 15.5 %   Platelets 110 (L) 150 - 400 K/uL   nRBC 1.9 (H) 0.0 - 0.2 %   Neutrophils Relative % 86 %   Neutro Abs 10.1 (H) 1.7 - 7.7 K/uL   Lymphocytes Relative 9 %   Lymphs Abs 1.1 0.7 -  4.0 K/uL   Monocytes Relative 4 %   Monocytes Absolute 0.5 0.1 - 1.0 K/uL   Eosinophils Relative 0 %   Eosinophils Absolute 0.0 0.0 - 0.5 K/uL   Basophils Relative 0 %   Basophils Absolute 0.0 0.0 - 0.1 K/uL   RBC Morphology RARE  NUCLEATED RED BLOOD CELLS    Immature Granulocytes 1 %   Abs Immature Granulocytes 0.15 (H) 0.00 - 0.07 K/uL   Polychromasia PRESENT    Ovalocytes PRESENT   Protime-INR     Status: Abnormal   Collection Time: 03/18/2021 12:24 AM  Result Value Ref Range   Prothrombin Time 16.8 (H) 11.4 - 15.2 seconds   INR 1.4 (H) 0.8 - 1.2  APTT     Status: None   Collection Time: 03/22/2021 12:24 AM  Result Value Ref Range   aPTT 29 24 - 36 seconds  Urinalysis, Routine w reflex microscopic Urine, In & Out Cath     Status: Abnormal   Collection Time: 04/04/2021 12:24 AM  Result Value Ref Range   Color, Urine YELLOW YELLOW   APPearance HAZY (A) CLEAR   Specific Gravity, Urine 1.025 1.005 - 1.030   pH 5.0 5.0 - 8.0   Glucose, UA NEGATIVE NEGATIVE mg/dL   Hgb urine dipstick SMALL (A) NEGATIVE   Bilirubin Urine NEGATIVE NEGATIVE   Ketones, ur 5 (A) NEGATIVE mg/dL   Protein, ur 100 (A) NEGATIVE mg/dL   Nitrite NEGATIVE NEGATIVE   Leukocytes,Ua NEGATIVE NEGATIVE   RBC / HPF 6-10 0 - 5 RBC/hpf   WBC, UA 6-10 0 - 5 WBC/hpf   Bacteria, UA RARE (A) NONE SEEN   Squamous Epithelial / LPF 0-5 0 - 5   Mucus PRESENT    Hyaline Casts, UA PRESENT    Granular Casts, UA PRESENT   hCG, quantitative, pregnancy     Status: Abnormal   Collection Time: 03/26/2021 12:24 AM  Result Value Ref Range   hCG, Beta Chain, Quant, S 5 (H) <5 mIU/mL  Lactic acid, plasma     Status: Abnormal   Collection Time: 03/28/2021 12:36 AM  Result Value Ref Range   Lactic Acid, Venous 3.8 (HH) 0.5 - 1.9 mmol/L  Lipase, blood     Status: Abnormal   Collection Time: 03/20/2021  1:20 AM  Result Value Ref Range   Lipase 75 (H) 11 - 51 U/L  Lactic acid, plasma     Status: Abnormal   Collection Time: 04/04/2021  4:47 AM  Result Value Ref Range   Lactic Acid, Venous 2.3 (HH) 0.5 - 1.9 mmol/L  Respiratory (~20 pathogens) panel by PCR     Status: None   Collection Time: 03/22/2021  7:42 AM   Specimen: Nasopharyngeal Swab; Respiratory  Result Value  Ref Range   Adenovirus NOT DETECTED NOT DETECTED   Coronavirus 229E NOT DETECTED NOT DETECTED   Coronavirus HKU1 NOT DETECTED NOT DETECTED   Coronavirus NL63 NOT DETECTED NOT DETECTED   Coronavirus OC43 NOT DETECTED NOT DETECTED   Metapneumovirus NOT DETECTED NOT DETECTED   Rhinovirus / Enterovirus NOT DETECTED NOT DETECTED   Influenza A NOT DETECTED NOT DETECTED   Influenza B NOT DETECTED NOT DETECTED   Parainfluenza Virus 1 NOT DETECTED NOT DETECTED   Parainfluenza Virus 2 NOT DETECTED NOT DETECTED   Parainfluenza Virus 3 NOT DETECTED NOT DETECTED   Parainfluenza Virus 4 NOT DETECTED NOT DETECTED   Respiratory Syncytial Virus NOT DETECTED NOT DETECTED   Bordetella pertussis NOT DETECTED NOT DETECTED  Bordetella Parapertussis NOT DETECTED NOT DETECTED   Chlamydophila pneumoniae NOT DETECTED NOT DETECTED   Mycoplasma pneumoniae NOT DETECTED NOT DETECTED   Recent Results (from the past 240 hour(s))  Resp Panel by RT-PCR (Flu A&B, Covid) Nasopharyngeal Swab     Status: None   Collection Time: 03/17/2021 12:24 AM   Specimen: Nasopharyngeal Swab; Nasopharyngeal(NP) swabs in vial transport medium  Result Value Ref Range Status   SARS Coronavirus 2 by RT PCR NEGATIVE NEGATIVE Final    Comment: (NOTE) SARS-CoV-2 target nucleic acids are NOT DETECTED.  The SARS-CoV-2 RNA is generally detectable in upper respiratory specimens during the acute phase of infection. The lowest concentration of SARS-CoV-2 viral copies this assay can detect is 138 copies/mL. A negative result does not preclude SARS-Cov-2 infection and should not be used as the sole basis for treatment or other patient management decisions. A negative result may occur with  improper specimen collection/handling, submission of specimen other than nasopharyngeal swab, presence of viral mutation(s) within the areas targeted by this assay, and inadequate number of viral copies(<138 copies/mL). A negative result must be combined  with clinical observations, patient history, and epidemiological information. The expected result is Negative.  Fact Sheet for Patients:  EntrepreneurPulse.com.au  Fact Sheet for Healthcare Providers:  IncredibleEmployment.be  This test is no t yet approved or cleared by the [place] FDA and  has been authorized for detection and/or diagnosis of SARS-CoV-2 by FDA under an Emergency Use Authorization (EUA). This EUA will remain  in effect (meaning this test can be used) for the duration of the COVID-19 declaration under Section 564(b)(1) of the Act, 21 U.S.C.section 360bbb-3(b)(1), unless the authorization is terminated  or revoked sooner.       Influenza A by PCR NEGATIVE NEGATIVE Final   Influenza B by PCR NEGATIVE NEGATIVE Final    Comment: (NOTE) The Xpert Xpress SARS-CoV-2/FLU/RSV plus assay is intended as an aid in the diagnosis of influenza from Nasopharyngeal swab specimens and should not be used as a sole basis for treatment. Nasal washings and aspirates are unacceptable for Xpert Xpress SARS-CoV-2/FLU/RSV testing.  Fact Sheet for Patients: EntrepreneurPulse.com.au  Fact Sheet for Healthcare Providers: IncredibleEmployment.be  This test is not yet approved or cleared by the [place] FDA and has been authorized for detection and/or diagnosis of SARS-CoV-2 by FDA under an Emergency Use Authorization (EUA). This EUA will remain in effect (meaning this test can be used) for the duration of the COVID-19 declaration under Section 564(b)(1) of the Act, 21 U.S.C. section 360bbb-3(b)(1), unless the authorization is terminated or revoked.  Performed at [facility], [place] [address], [place], [place] [address]   Respiratory (~20 pathogens) panel by PCR     Status: None   Collection Time: 04/09/2021  7:42 AM   Specimen: Nasopharyngeal Swab; Respiratory  Result Value Ref Range  Status   Adenovirus NOT DETECTED NOT DETECTED Final   Coronavirus 229E NOT DETECTED NOT DETECTED Final    Comment: (NOTE) The Coronavirus on the Respiratory Panel, DOES NOT test for the novel  Coronavirus (2019 nCoV)    Coronavirus HKU1 NOT DETECTED NOT DETECTED Final   Coronavirus NL63 NOT DETECTED NOT DETECTED Final   Coronavirus OC43 NOT DETECTED NOT DETECTED Final   Metapneumovirus NOT DETECTED NOT DETECTED Final   Rhinovirus / Enterovirus NOT DETECTED NOT DETECTED Final   Influenza A NOT DETECTED NOT DETECTED Final   Influenza B NOT DETECTED NOT DETECTED Final   Parainfluenza Virus 1 NOT DETECTED NOT  DETECTED Final   Parainfluenza Virus 2 NOT DETECTED NOT DETECTED Final   Parainfluenza Virus 3 NOT DETECTED NOT DETECTED Final   Parainfluenza Virus 4 NOT DETECTED NOT DETECTED Final   Respiratory Syncytial Virus NOT DETECTED NOT DETECTED Final   Bordetella pertussis NOT DETECTED NOT DETECTED Final   Bordetella Parapertussis NOT DETECTED NOT DETECTED Final   Chlamydophila pneumoniae NOT DETECTED NOT DETECTED Final   Mycoplasma pneumoniae NOT DETECTED NOT DETECTED Final    Comment: Performed at [facility], [place] [address], [place], [place] [address]    Renal Function: Recent Labs    04/02/2021 0024  CREATININE 1.26*   Estimated Creatinine Clearance: 54.6 mL/min (A) (by C-G formula based on SCr of 1.26 mg/dL (H)).  Radiologic Imaging: CT ABDOMEN PELVIS WO CONTRAST  Result Date: 03/28/2021 CLINICAL DATA:  Acute abdominal pain, history of biliary obstruction with stenting, breast cancer with bone metastasis, chronic pain, hypertension, presents with generalized weakness and dry cough for 1 week, fatigue. Found with acute kidney injury, tachycardia, elevated lactic acid EXAM: CT ABDOMEN AND PELVIS WITHOUT CONTRAST TECHNIQUE: Multidetector CT imaging of the abdomen and pelvis was performed following the standard protocol without IV contrast. RADIATION DOSE REDUCTION: This exam  was performed according to the departmental dose-optimization program which includes automated exposure control, adjustment of the mA and/or kV according to patient size and/or use of iterative reconstruction technique. COMPARISON:  02/06/2021; correlation PET-CT 03/11/2021 FINDINGS: Lower chest: Peribronchial thickening and interstitial prominence at lung bases. Minimal bibasilar atelectasis. Hepatobiliary: Post PTC with biliary drain extending from anterior abdominal wall through liver, CBD, and into third portion of duodenum. Mild intrahepatic biliary dilatation. Gallbladder filled by dense material, unchanged, question prior contrast. Remainder of liver unremarkable. Pancreas: Enlargement and surrounding infiltrative changes at pancreatic head again seen. Pancreatic body and tail otherwise normal appearance. Spleen: Normal appearance Adrenals/Urinary Tract: Adrenal glands normal appearance. Persistent RIGHT hydronephrosis and proximal hydroureter, distal ureter decompressed. Mild LEFT hydronephrosis without ureteral dilatation. Bladder wall appears thickened but bladder is underdistended question artifact. Stomach/Bowel: Stomach unremarkable. No bowel dilatation or wall thickening. Slightly prominent gas within sigmoid loop. Vascular/Lymphatic: Atherosclerotic calcifications aorta and iliac arteries without aneurysm. BILATERAL pelvic adenopathy. Reproductive: Uterus surgically absent with nonvisualization of ovaries Other: Increase in size of fluid collection at lesser sac now 1.0 x 2.5 cm. Scattered ascites perihepatic, perisplenic, pelvis. Extensive omental infiltration and peritoneal soft tissue nodularity consistent with peritoneal carcinomatosis. Increase in size of a perigastric mass 3.2 x 2.3 cm image 34. Musculoskeletal: Numerous osseous metastases, both lytic and sclerotic. IMPRESSION: Post PTC with biliary drain extending from anterior abdominal wall through liver, CBD, and into third portion of  duodenum with mild intrahepatic biliary dilatation. Persistent RIGHT and mild LEFT hydronephrosis without ureteral dilatation, question due to UPJ obstruction on LEFT and mid ureteral obstruction on RIGHT Extensive omental infiltration and peritoneal soft tissue nodularity consistent with peritoneal carcinomatosis, progressive since prior exam. Increase in size of a perigastric mass and BILATERAL pelvic adenopathy. Numerous osseous metastases. Increased loculated fluid collection at lesser sac. Aortic Atherosclerosis (ICD10-I70.0). Electronically Signed   By: Lavonia Dana M.D.   On: 04/09/2021 11:11   DG Chest Port 1 View  Result Date: 04/08/2021 CLINICAL DATA:  Sepsis EXAM: PORTABLE CHEST 1 VIEW COMPARISON:  CT 12/29/2020 FINDINGS: Mild elevation of the right hemidiaphragm is unchanged. Lungs are clear. No pneumothorax or pleural effusion. Cardiac size within normal limits. Left internal jugular chest port tip seen within the superior cavoatrial junction. Sclerotic metastasis with  associated pathologic fracture noted within the right fourth and sixth ribs. Healed left fourth rib fracture. No acute bone abnormality. IMPRESSION: No active disease. Electronically Signed   By: Fidela Salisbury M.D.   On: 03/25/2021 02:04    I independently reviewed the above imaging studies.  Impression/Recommendation: 56 yo F with metastatic breast cancer on chemo with bilateral hydronephrosis. Hydronephrosis appears stable on the R, new mild hydronephrosis on the L.   At this time it is unclear if obstruction is contributing to the patient's AKI. Will continue to follow; if Cr continues to worsen likely would proceed with bilateral ureteral stent placement.    Donald Pore 03/31/2021, 11:40 AM  [facility]  Pager: [phone]

## 2021-04-05 NOTE — Sepsis Progress Note (Signed)
Elink following code sepsis °

## 2021-04-05 NOTE — Progress Notes (Signed)
Pharmacy Antibiotic Note  [name] is a 56 y.o. female admitted on 03/22/2021 with sepsis.  Possible intra-abdominal source given increased output from biliary drain.  CT pending.  Pharmacy has been consulted for Cefepime dosing.  Plan: Metronidazole per MD Cefepime 2g IV q12h Follow up renal function, culture results, and clinical course.   Height: 5\' 10"  (177.8 cm) Weight: 78.7 kg (173 lb 8 oz) IBW/kg (Calculated) : 68.5  Temp (24hrs), Avg:99.5 F (37.5 C), Min:99.5 F (37.5 C), Max:99.5 F (37.5 C)  Recent Labs  Lab 03/31/2021 0024 04/08/2021 0036 03/27/2021 0447  WBC 11.8*  --   --   CREATININE 1.26*  --   --   LATICACIDVEN  --  3.8* 2.3*    Estimated Creatinine Clearance: 54.6 mL/min (A) (by C-G formula based on SCr of 1.26 mg/dL (H)).    No Known Allergies  Antimicrobials this admission: 1/23 Cefepime >>   Dose adjustments this admission:   Microbiology results: 1/23 Resp panel: COVID neg; Influenza neg 1/23 BCx: 1/23 UCx: 1/23 Resp panel:   Thank you for allowing pharmacy to be a part of this patients care.  Gretta Arab PharmD, BCPS Clinical Pharmacist [facility] main pharmacy [phone] 03/27/2021 8:26 AM

## 2021-04-06 ENCOUNTER — Inpatient Hospital Stay (HOSPITAL_COMMUNITY): Payer: BC Managed Care – PPO

## 2021-04-06 ENCOUNTER — Inpatient Hospital Stay (HOSPITAL_COMMUNITY): Payer: BC Managed Care – PPO | Admitting: Certified Registered Nurse Anesthetist

## 2021-04-06 ENCOUNTER — Encounter (HOSPITAL_COMMUNITY): Admission: EM | Disposition: E | Payer: Self-pay | Source: Home / Self Care | Attending: Family Medicine

## 2021-04-06 ENCOUNTER — Encounter (HOSPITAL_COMMUNITY): Payer: Self-pay | Admitting: Internal Medicine

## 2021-04-06 DIAGNOSIS — N1330 Unspecified hydronephrosis: Secondary | ICD-10-CM | POA: Diagnosis present

## 2021-04-06 DIAGNOSIS — I824Y3 Acute embolism and thrombosis of unspecified deep veins of proximal lower extremity, bilateral: Secondary | ICD-10-CM | POA: Diagnosis present

## 2021-04-06 DIAGNOSIS — M7989 Other specified soft tissue disorders: Secondary | ICD-10-CM | POA: Diagnosis not present

## 2021-04-06 DIAGNOSIS — A419 Sepsis, unspecified organism: Secondary | ICD-10-CM | POA: Diagnosis not present

## 2021-04-06 DIAGNOSIS — C801 Malignant (primary) neoplasm, unspecified: Secondary | ICD-10-CM

## 2021-04-06 DIAGNOSIS — D696 Thrombocytopenia, unspecified: Secondary | ICD-10-CM

## 2021-04-06 DIAGNOSIS — E871 Hypo-osmolality and hyponatremia: Secondary | ICD-10-CM

## 2021-04-06 DIAGNOSIS — K831 Obstruction of bile duct: Secondary | ICD-10-CM

## 2021-04-06 DIAGNOSIS — D6481 Anemia due to antineoplastic chemotherapy: Secondary | ICD-10-CM

## 2021-04-06 DIAGNOSIS — E872 Acidosis, unspecified: Secondary | ICD-10-CM | POA: Diagnosis present

## 2021-04-06 DIAGNOSIS — R578 Other shock: Secondary | ICD-10-CM

## 2021-04-06 DIAGNOSIS — E875 Hyperkalemia: Secondary | ICD-10-CM

## 2021-04-06 DIAGNOSIS — G9341 Metabolic encephalopathy: Secondary | ICD-10-CM

## 2021-04-06 DIAGNOSIS — R6521 Severe sepsis with septic shock: Secondary | ICD-10-CM

## 2021-04-06 DIAGNOSIS — N179 Acute kidney failure, unspecified: Secondary | ICD-10-CM | POA: Diagnosis present

## 2021-04-06 DIAGNOSIS — T451X5A Adverse effect of antineoplastic and immunosuppressive drugs, initial encounter: Secondary | ICD-10-CM

## 2021-04-06 HISTORY — PX: CYSTOSCOPY WITH STENT PLACEMENT: SHX5790

## 2021-04-06 LAB — ECHOCARDIOGRAM COMPLETE
AR max vel: 2.29 cm2
AV Area VTI: 2.04 cm2
AV Area mean vel: 2.15 cm2
AV Mean grad: 1 mmHg
AV Peak grad: 2.4 mmHg
Ao pk vel: 0.78 m/s
Calc EF: 34.8 %
Height: 70 in
Single Plane A2C EF: 34.6 %
Single Plane A4C EF: 38.5 %
Weight: 2776.03 oz

## 2021-04-06 LAB — COMPREHENSIVE METABOLIC PANEL
ALT: 28 U/L (ref 0–44)
AST: 65 U/L — ABNORMAL HIGH (ref 15–41)
Albumin: 2.6 g/dL — ABNORMAL LOW (ref 3.5–5.0)
Alkaline Phosphatase: 242 U/L — ABNORMAL HIGH (ref 38–126)
Anion gap: 14 (ref 5–15)
BUN: 78 mg/dL — ABNORMAL HIGH (ref 6–20)
CO2: 17 mmol/L — ABNORMAL LOW (ref 22–32)
Calcium: 8.2 mg/dL — ABNORMAL LOW (ref 8.9–10.3)
Chloride: 99 mmol/L (ref 98–111)
Creatinine, Ser: 1.8 mg/dL — ABNORMAL HIGH (ref 0.44–1.00)
GFR, Estimated: 33 mL/min — ABNORMAL LOW (ref >60)
Glucose, Bld: 158 mg/dL — ABNORMAL HIGH (ref 70–99)
Potassium: 5.2 mmol/L — ABNORMAL HIGH (ref 3.5–5.1)
Sodium: 130 mmol/L — ABNORMAL LOW (ref 135–145)
Total Bilirubin: 1.2 mg/dL (ref 0.3–1.2)
Total Protein: 6.9 g/dL (ref 6.5–8.1)

## 2021-04-06 LAB — TROPONIN I (HIGH SENSITIVITY): Troponin I (High Sensitivity): 71 ng/L — ABNORMAL HIGH (ref <18)

## 2021-04-06 LAB — PROCALCITONIN: Procalcitonin: 1.59 ng/mL

## 2021-04-06 LAB — CBC
HCT: 32 % — ABNORMAL LOW (ref 36.0–46.0)
Hemoglobin: 10 g/dL — ABNORMAL LOW (ref 12.0–15.0)
MCH: 29.2 pg (ref 26.0–34.0)
MCHC: 31.3 g/dL (ref 30.0–36.0)
MCV: 93.6 fL (ref 80.0–100.0)
Platelets: 75 10*3/uL — ABNORMAL LOW (ref 150–400)
RBC: 3.42 MIL/uL — ABNORMAL LOW (ref 3.87–5.11)
RDW: 14.3 % (ref 11.5–15.5)
WBC: 13.3 10*3/uL — ABNORMAL HIGH (ref 4.0–10.5)
nRBC: 1.5 % — ABNORMAL HIGH (ref 0.0–0.2)

## 2021-04-06 LAB — URINE CULTURE: Culture: NO GROWTH

## 2021-04-06 LAB — HEPARIN LEVEL (UNFRACTIONATED): Heparin Unfractionated: 1.05 IU/mL — ABNORMAL HIGH (ref 0.30–0.70)

## 2021-04-06 LAB — CORTISOL-AM, BLOOD: Cortisol - AM: 72 ug/dL — ABNORMAL HIGH (ref 6.7–22.6)

## 2021-04-06 LAB — PROTIME-INR
INR: 1.6 — ABNORMAL HIGH (ref 0.8–1.2)
Prothrombin Time: 19.3 seconds — ABNORMAL HIGH (ref 11.4–15.2)

## 2021-04-06 LAB — BRAIN NATRIURETIC PEPTIDE: B Natriuretic Peptide: 108.3 pg/mL — ABNORMAL HIGH (ref 0.0–100.0)

## 2021-04-06 LAB — LACTIC ACID, PLASMA: Lactic Acid, Venous: 1.9 mmol/L (ref 0.5–1.9)

## 2021-04-06 LAB — MRSA NEXT GEN BY PCR, NASAL: MRSA by PCR Next Gen: NOT DETECTED

## 2021-04-06 SURGERY — CYSTOSCOPY, WITH STENT INSERTION
Anesthesia: General | Site: Ureter | Laterality: Bilateral

## 2021-04-06 MED ORDER — HYDROMORPHONE HCL 1 MG/ML IJ SOLN
0.2500 mg | INTRAMUSCULAR | Status: DC | PRN
  Administered 2021-04-06 (×2): 0.25 mg via INTRAVENOUS

## 2021-04-06 MED ORDER — HEPARIN (PORCINE) 25000 UT/250ML-% IV SOLN
1200.0000 [IU]/h | INTRAVENOUS | Status: DC
  Administered 2021-04-06: 16:00:00 1200 [IU]/h via INTRAVENOUS
  Filled 2021-04-06: qty 250

## 2021-04-06 MED ORDER — MIDAZOLAM HCL 2 MG/2ML IJ SOLN
INTRAMUSCULAR | Status: DC | PRN
  Administered 2021-04-06: 1 mg via INTRAVENOUS

## 2021-04-06 MED ORDER — LACTATED RINGERS IV BOLUS
1000.0000 mL | Freq: Once | INTRAVENOUS | Status: AC
  Administered 2021-04-06: 09:00:00 1000 mL via INTRAVENOUS

## 2021-04-06 MED ORDER — PHENYLEPHRINE HCL-NACL 20-0.9 MG/250ML-% IV SOLN
INTRAVENOUS | Status: AC
  Administered 2021-04-06: 10:00:00 20 mg
  Filled 2021-04-06: qty 250

## 2021-04-06 MED ORDER — ORAL CARE MOUTH RINSE
15.0000 mL | Freq: Two times a day (BID) | OROMUCOSAL | Status: DC
  Administered 2021-04-06: 22:00:00 15 mL via OROMUCOSAL

## 2021-04-06 MED ORDER — OXYCODONE HCL 5 MG PO TABS: 5.0000 mg | ORAL_TABLET | Freq: Once | ORAL | Status: DC | PRN

## 2021-04-06 MED ORDER — HEPARIN BOLUS VIA INFUSION
2000.0000 [IU] | Freq: Once | INTRAVENOUS | Status: AC
  Administered 2021-04-06: 16:00:00 2000 [IU] via INTRAVENOUS
  Filled 2021-04-06: qty 2000

## 2021-04-06 MED ORDER — ALBUMIN HUMAN 5 % IV SOLN
INTRAVENOUS | Status: AC
  Filled 2021-04-06: qty 500

## 2021-04-06 MED ORDER — DEXAMETHASONE SODIUM PHOSPHATE 10 MG/ML IJ SOLN
INTRAMUSCULAR | Status: AC
  Filled 2021-04-06: qty 1

## 2021-04-06 MED ORDER — FENTANYL CITRATE (PF) 100 MCG/2ML IJ SOLN
INTRAMUSCULAR | Status: DC | PRN
  Administered 2021-04-06 (×2): 50 ug via INTRAVENOUS

## 2021-04-06 MED ORDER — MIDAZOLAM HCL 2 MG/2ML IJ SOLN: 0.5000 mg | Freq: Once | INTRAMUSCULAR | Status: DC | PRN

## 2021-04-06 MED ORDER — SODIUM CHLORIDE 0.9 % IV BOLUS
500.0000 mL | Freq: Once | INTRAVENOUS | Status: AC
  Administered 2021-04-06: 01:00:00 500 mL via INTRAVENOUS

## 2021-04-06 MED ORDER — PHENYLEPHRINE 40 MCG/ML (10ML) SYRINGE FOR IV PUSH (FOR BLOOD PRESSURE SUPPORT)
PREFILLED_SYRINGE | INTRAVENOUS | Status: DC | PRN
  Administered 2021-04-06 (×3): 200 ug via INTRAVENOUS

## 2021-04-06 MED ORDER — ONDANSETRON HCL 4 MG/2ML IJ SOLN
INTRAMUSCULAR | Status: AC
  Filled 2021-04-06: qty 2

## 2021-04-06 MED ORDER — HYDROMORPHONE HCL 1 MG/ML IJ SOLN
0.5000 mg | INTRAMUSCULAR | Status: DC | PRN
  Administered 2021-04-06 – 2021-04-07 (×4): 1 mg via INTRAVENOUS
  Filled 2021-04-06 (×4): qty 1

## 2021-04-06 MED ORDER — SODIUM CHLORIDE 0.9 % IV SOLN
1.0000 g | Freq: Two times a day (BID) | INTRAVENOUS | Status: DC
  Administered 2021-04-06 – 2021-04-07 (×2): 1 g via INTRAVENOUS
  Filled 2021-04-06 (×5): qty 1

## 2021-04-06 MED ORDER — LACTATED RINGERS IV SOLN: INTRAVENOUS | Status: DC | PRN

## 2021-04-06 MED ORDER — SUCCINYLCHOLINE CHLORIDE 200 MG/10ML IV SOSY
PREFILLED_SYRINGE | INTRAVENOUS | Status: AC
  Filled 2021-04-06: qty 10

## 2021-04-06 MED ORDER — SUCCINYLCHOLINE CHLORIDE 200 MG/10ML IV SOSY
PREFILLED_SYRINGE | INTRAVENOUS | Status: DC | PRN
  Administered 2021-04-06: 100 mg via INTRAVENOUS

## 2021-04-06 MED ORDER — PHENYLEPHRINE HCL (PRESSORS) 10 MG/ML IV SOLN
INTRAVENOUS | Status: AC
  Filled 2021-04-06: qty 1

## 2021-04-06 MED ORDER — ROCURONIUM BROMIDE 10 MG/ML (PF) SYRINGE
PREFILLED_SYRINGE | INTRAVENOUS | Status: AC
  Filled 2021-04-06: qty 10

## 2021-04-06 MED ORDER — PHENYLEPHRINE HCL-NACL 20-0.9 MG/250ML-% IV SOLN
0.0000 ug/min | INTRAVENOUS | Status: DC
  Administered 2021-04-06: 15:00:00 60 ug/min via INTRAVENOUS
  Administered 2021-04-06: 22:00:00 40 ug/min via INTRAVENOUS
  Administered 2021-04-07: 08:00:00 350 ug/min via INTRAVENOUS
  Filled 2021-04-06 (×4): qty 250

## 2021-04-06 MED ORDER — PROPOFOL 10 MG/ML IV BOLUS
INTRAVENOUS | Status: AC
  Filled 2021-04-06: qty 20

## 2021-04-06 MED ORDER — PHENYLEPHRINE HCL-NACL 20-0.9 MG/250ML-% IV SOLN
0.0000 ug/min | INTRAVENOUS | Status: DC
  Administered 2021-04-06: 15:00:00 50 ug/min via INTRAVENOUS
  Filled 2021-04-06: qty 250

## 2021-04-06 MED ORDER — MIDAZOLAM HCL 2 MG/2ML IJ SOLN
INTRAMUSCULAR | Status: AC
  Filled 2021-04-06: qty 2

## 2021-04-06 MED ORDER — LIDOCAINE HCL (CARDIAC) PF 100 MG/5ML IV SOSY
PREFILLED_SYRINGE | INTRAVENOUS | Status: DC | PRN
  Administered 2021-04-06: 20 mg via INTRAVENOUS

## 2021-04-06 MED ORDER — ROCURONIUM BROMIDE 10 MG/ML (PF) SYRINGE
PREFILLED_SYRINGE | INTRAVENOUS | Status: DC | PRN
  Administered 2021-04-06: 20 mg via INTRAVENOUS

## 2021-04-06 MED ORDER — PROMETHAZINE HCL 25 MG/ML IJ SOLN: 6.2500 mg | INTRAMUSCULAR | Status: DC | PRN

## 2021-04-06 MED ORDER — PHENYLEPHRINE 40 MCG/ML (10ML) SYRINGE FOR IV PUSH (FOR BLOOD PRESSURE SUPPORT)
PREFILLED_SYRINGE | INTRAVENOUS | Status: AC
  Filled 2021-04-06: qty 10

## 2021-04-06 MED ORDER — PHENYLEPHRINE HCL-NACL 20-0.9 MG/250ML-% IV SOLN
INTRAVENOUS | Status: DC | PRN
  Administered 2021-04-06: 50 ug/min via INTRAVENOUS
  Administered 2021-04-06: 25 ug/min via INTRAVENOUS

## 2021-04-06 MED ORDER — SUGAMMADEX SODIUM 200 MG/2ML IV SOLN
INTRAVENOUS | Status: DC | PRN
  Administered 2021-04-06: 200 mg via INTRAVENOUS

## 2021-04-06 MED ORDER — ALBUMIN HUMAN 5 % IV SOLN: INTRAVENOUS | Status: DC | PRN

## 2021-04-06 MED ORDER — FENTANYL CITRATE PF 50 MCG/ML IJ SOSY
PREFILLED_SYRINGE | INTRAMUSCULAR | Status: AC
  Filled 2021-04-06: qty 1

## 2021-04-06 MED ORDER — HYDROMORPHONE HCL 1 MG/ML IJ SOLN
INTRAMUSCULAR | Status: AC
  Filled 2021-04-06: qty 1

## 2021-04-06 MED ORDER — LIDOCAINE HCL (PF) 2 % IJ SOLN
INTRAMUSCULAR | Status: AC
  Filled 2021-04-06: qty 5

## 2021-04-06 MED ORDER — LINEZOLID 600 MG/300ML IV SOLN
600.0000 mg | Freq: Two times a day (BID) | INTRAVENOUS | Status: DC
  Administered 2021-04-06 – 2021-04-07 (×2): 600 mg via INTRAVENOUS
  Filled 2021-04-06 (×5): qty 300

## 2021-04-06 MED ORDER — HEPARIN (PORCINE) 25000 UT/250ML-% IV SOLN: 1000.0000 [IU]/h | INTRAVENOUS | Status: DC

## 2021-04-06 MED ORDER — FENTANYL CITRATE (PF) 100 MCG/2ML IJ SOLN
INTRAMUSCULAR | Status: AC
  Filled 2021-04-06: qty 2

## 2021-04-06 MED ORDER — ETOMIDATE 2 MG/ML IV SOLN
INTRAVENOUS | Status: DC | PRN
  Administered 2021-04-06: 14 mg via INTRAVENOUS

## 2021-04-06 MED ORDER — LACTATED RINGERS IV BOLUS
1250.0000 mL | Freq: Once | INTRAVENOUS | Status: AC
  Administered 2021-04-06: 15:00:00 1250 mL via INTRAVENOUS

## 2021-04-06 MED ORDER — OXYCODONE HCL 5 MG/5ML PO SOLN: 5.0000 mg | Freq: Once | ORAL | Status: DC | PRN

## 2021-04-06 MED ORDER — ONDANSETRON HCL 4 MG/2ML IJ SOLN
INTRAMUSCULAR | Status: DC | PRN
  Administered 2021-04-06: 4 mg via INTRAVENOUS

## 2021-04-06 MED ORDER — IOHEXOL 300 MG/ML  SOLN
INTRAMUSCULAR | Status: DC | PRN
  Administered 2021-04-06: 11:00:00 10 mL

## 2021-04-06 MED ORDER — DEXAMETHASONE SODIUM PHOSPHATE 10 MG/ML IJ SOLN
INTRAMUSCULAR | Status: DC | PRN
  Administered 2021-04-06: 5 mg via INTRAVENOUS

## 2021-04-06 SURGICAL SUPPLY — 13 items
BAG URO CATCHER STRL LF (MISCELLANEOUS) ×2 IMPLANT
BASKET ZERO TIP NITINOL 2.4FR (BASKET) IMPLANT
CATH URETL OPEN END 6FR 70 (CATHETERS) IMPLANT
CLOTH BEACON ORANGE TIMEOUT ST (SAFETY) ×2 IMPLANT
GLOVE SURG ENC TEXT LTX SZ7.5 (GLOVE) ×2 IMPLANT
GOWN STRL REUS W/TWL XL LVL3 (GOWN DISPOSABLE) ×2 IMPLANT
GUIDEWIRE ANG ZIPWIRE 038X150 (WIRE) IMPLANT
GUIDEWIRE STR DUAL SENSOR (WIRE) ×2 IMPLANT
KIT TURNOVER KIT A (KITS) IMPLANT
MANIFOLD NEPTUNE II (INSTRUMENTS) ×2 IMPLANT
PACK CYSTO (CUSTOM PROCEDURE TRAY) ×2 IMPLANT
STENT URET 6FRX26 CONTOUR (STENTS) ×2 IMPLANT
TUBING CONNECTING 10 (TUBING) ×2 IMPLANT

## 2021-04-06 NOTE — Progress Notes (Signed)
PHARMACY NOTE -  Meropenem  Pharmacy has been assisting with dosing of meropenem for IAI. Dosage remains stable at 1g IV q12 hr and further renal adjustments per institutional Pharmacy antibiotic protocol  Pharmacy will sign off, following peripherally for culture results or dose adjustments. Please reconsult if a change in clinical status warrants re-evaluation of dosage.  Reuel Boom, PharmD, BCPS [phone] 03/21/2021, 8:41 AM

## 2021-04-06 NOTE — Hospital Course (Signed)
[name] is a 56 y.o. 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  1/23: Urology consulted, started on broad spectrum antibiotics. 1/24: Worsening HR, BP, mentation --> to OR for ureteral stents

## 2021-04-06 NOTE — Assessment & Plan Note (Signed)
In setting of cancer, worsening with sepsis. - Trend CBC - Trend coags

## 2021-04-06 NOTE — Assessment & Plan Note (Addendum)
P/w tachycardia, tachypnea, encephalopathy.  Unclear source.  [place] pending, urine culture negative.  Don't think gallbladder because perc-drain seems to be draining.  COVID-, RVP-  - Repeat lactate - Bolus 1L LR now  - [place] to Meropenem - Add vancomycin  - Follow blood cultures - To OR for ureteral stents now - Consult CCM given worsening hemodynamics

## 2021-04-06 NOTE — Progress Notes (Signed)
°   04/13/2021 0813  Treat  Pain Scale 0-10  Pain Score 0  Notify: Provider  Provider Name/Title Dr. Loleta Books  Date Provider Notified 04/08/2021  Time Provider Notified [phone]  Notification Type Page (secure chat)  Notification Reason Other (Comment) (Updated regarding Pt and Red MEWS)  Provider response En route  Date of Provider Response 03/21/2021  Time of Provider Response [phone]

## 2021-04-06 NOTE — Consult Note (Shared)
NAME:  EBANY BOWERMASTER, MRN:  [id number], DOB:  May 12, 1965, LOS: 1 ADMISSION DATE:  03/28/2021, CONSULTATION DATE:  *** REFERRING MD:  ***, CHIEF COMPLAINT:  ***   History of Present Illness:  55yof arrived to [facility] ED on 01/22 via EMS from home with increased weakness, lack of appetite and back pain. Patient reported she has had a dry cough x1week. She tried mucinex at home with no relief. Her symptoms progressed up until 01/22 to weakness before presenting to the ED. She reported fatigue throughout the day that was complicated by nausea. She found herself unable to move herself around. She was found on the floor by her mom prior to presenting to the ED. She became concerned and EMS was called. Finished radiation in December and currently taking Chemo pill medication.   There is concern for sepsis, side effects of chemotherapy, electrolyte disturbance, pain related to bony metastases. In ED, noted to have an AKI, tachycardic, labile blood pressure, low grade temp 99.5. started on cefepime. Admitted to [facility]. Code sepsis initiated.   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 [facility] team consulted for Code Sepsis. 01/25 Increased WOB in acute distress with SOB, unable to obtain O2 sat at this time. Tachycardic, tachypnea and hypotensive. Increased oxygen requirement. Increase demand for pressors.   Interim History / Subjective:  Patient in acute distress with increased WOB and SOB  Objective   Blood pressure 96/69, pulse (!) 124, temperature 97.9 F (36.6 C), temperature source Oral, resp. rate (!) 36, height _0   (1.778 m), weight 78.7 kg, SpO2 100 %.        Intake/Output Summary (Last 24 hours) at 03/23/2021 1117 Last data filed at 03/26/2021 1115 Gross per 24 hour  Intake 2524.51 ml  Output 550 ml  Net 1974.51 ml   Filed Weights   03/17/2021 0125 03/22/2021 0905  Weight: 78.7 kg 78.7 kg   Examination: General: 89 yof in acute distress HENT: NCAT no JVD Lungs: Tachypnea increased WOB and SOB. Clear throughout; diminished in bibasilar Cardiovascular: RRR ST. HR 120's. No ectopy. 2+ radial pulses bilateral  Abdomen: soft, round nontender Extremities: dry and warm.BLE edema; R>L  Neuro: awake, disoriented to place, time, situation.  GU: clear urine draining to foley in BSB  Resolved Hospital Problem list   Present on Admission:  (Resolved) SIRS (systemic inflammatory response syndrome) (HCC)  Septic shock (HCC)  Bone metastases (HCC)  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 ([place])  Acute metabolic encephalopathy  AKI (acute kidney injury) ([place])  Bilateral hydronephrosis  Lactic acidosis  DVT, lower extremity, proximal, acute, bilateral ([place])    Assessment & Plan:  Severe Sepsis 2/2 bilateral hydronephrosis. ?Present on arrival. On admission patient noted to have an AKI, tachycardic, labile blood pressure, low grade temp 99.5. lactic acid elevated 3.8<2.3<2.0. Currently requiring phenylephrine for blood pressure support. Plan Blood cultures NGTD Urine cultures NGTD Continue to follow blood and urine cultures Consider consulting ID if organism identify to help with Abx management Continue broad spectrum Abx  Acute hypoxic respiratory failure likely 2/2 acute obstructive shock caused by strong correlation of  Pulmonary embolism. Lower extremity doppler positive for bilateral DVT. Patient has acute  distress with increased work of breathing and SOB. Unable to obtain O2 sat; has increasing oxygen requirements.   Plan Continue heparin gtt Supplemental oxygen IV fluid bolus IV Levo, phenyl, vaso  to support blood pressure  Mixed metabolic and respiratory acidosis likely 2/2 hypoventilation from acute pulmonary embolism. pH 6.99 pCO2 36.2 pO2 58.1 HCO3 23.1 Plan  Bibarb gtt ABG PRN  Acute metabolic encephalopathy   Secondary to elevated AST, lactic acid.  ?biliary obstruction caused by metastatic cancer, ?patent stent in bile duct. Effects of sedation on 01/24 s/p renal stent placement. -Mental status: currently confused with incomprehensible words. -Fluid and electrolyte imbalances  Plan Broad spectrum Abx Fluid resuscitation  Consider holding all opioids until mental status is approved Consider sending ammonia level   AKI  Secondary to bilateral hydronephrosis R>L. CT abdomen and pelvis showed persistent pancreatic head abnormality, PTC drain in place, right and left hydronephrosis, R>L and ?UPJ obstruction left, ureteral obstruction right, as well as new exetensive omental infiltration,  -diminished urine output -hypovolemic 2/2 obstructive shock  Creatinine 2.60 elevated from baseline  Plan Urology following Bilateral ureteral stent placed 01/24 Avoid nephrotoxic medications Bladder scan PRN for urinary retention Monitor fluid status Continue IV fluids Trend creatinine Strict I&O Hold diuretics  Anemia 2/2 chemotherapy. Hgb currently stable. Plan Continue to monitor for bleeding Trend CBC  Thrombocytopenia 2/2 cancer, chemo medication, and worsening sepsis Plan Trend CBC Trend coag studies Monitor for bleeding  Leukocytosis 2/2 severe sepsis caused by bilateral hydronephrosis. Plan  Trend CBC  Broad spectrum Abx Continue to follow blood and urine cultures Consider consulting ID if organism identify to help with Abx management  Fluid and electrolyte imbalance Hyperkalemia (5.9) 2/2 AKI IV fluids bolus PRN Trend BMP  Right leg swelling US revealed bilateral lower extremity DVT Plan Heparin gtt for thrombosis therapy  Hypertension: Chronic HOLD  blood pressure medications until stable   Best Practice (right click and "Reselect all SmartList Selections" daily)   Diet/type: NPO DVT prophylaxis: systemic heparin GI prophylaxis: N/A Lines: Central line Foley:  Yes, and it is still needed Code Status:  DNR Last date of multidisciplinary goals of care discussion _0   Labs   CBC: Recent Labs  Lab 03/28/2021 0024 03/15/2021 0322 May 03, 2021 0441  WBC 11.8* 13.3* 14.5*  NEUTROABS 10.1*  --   --   HGB 10.7* 10.0* 9.3*  HCT 33.2* 32.0* 30.1*  MCV 92.0 93.6 96.2  PLT 110* 75* 97*    Basic Metabolic Panel: Recent Labs  Lab 04/01/2021 0024 03/29/2021 0322 05-03-21 0441  NA 130* 130* 132*  K 4.1 5.2* 5.9*  CL 95* 99 104  CO2 20* 17* 13*  GLUCOSE 162* 158* 155*  BUN 64* 78* 77*  CREATININE 1.26* 1.80* 2.60*  CALCIUM 9.5 8.2* 7.5*   GFR: Estimated Creatinine Clearance: 38.2 mL/min (A) (by C-G formula based on SCr of 1.8 mg/dL (H)). Recent Labs  Lab 03/28/2021 0024 03/31/2021 0036 04/12/2021 0447 03/26/2021 2003 04/10/2021 0322 03/17/2021 1532 May 03, 2021 0441  PROCALCITON  --   --   --   --  1.59  --   --   WBC 11.8*  --   --   --  13.3*  --  14.5*  LATICACIDVEN  --  3.8* 2.3* 2.0*  --  1.9  --     Liver Function Tests: Recent Labs  Lab 04/02/2021 0024 03/22/2021 0322  AST 54* 65*  ALT 25 28  ALKPHOS 259* 242*  BILITOT 1.0 1.2  PROT 7.5 6.9  ALBUMIN 2.9* 2.6*   Recent Labs  Lab 04/11/2021 0120  LIPASE 75*   No results for input(s): AMMONIA in the last 168 hours.  ABG No results found for: PHART, PCO2ART, PO2ART, HCO3, TCO2, ACIDBASEDEF, O2SAT   Coagulation Profile: Recent Labs  Lab 04/11/2021 0024 03/30/2021 0322 04/24/21 0441  INR 1.4* 1.6* 2.3*    Cardiac Enzymes: No results for input(s): CKTOTAL, CKMB, CKMBINDEX, TROPONINI in the last 168 hours.  HbA1C: No results found for: HGBA1C  CBG: No results for input(s): GLUCAP in the last 168 hours.  Review of Systems:   Review of Systems  Constitutional: Negative.    HENT: Negative.    Eyes: Negative.   Respiratory:  Positive for cough and shortness of breath.   Cardiovascular:  Positive for orthopnea and leg swelling.  Gastrointestinal: Negative.   Genitourinary: Negative.   Musculoskeletal: Negative.   Skin: Negative.   Neurological:  Positive for weakness.  Endo/Heme/Allergies: Negative.   Psychiatric/Behavioral: Negative.      Past Medical History:  She,  has a past medical history of Allergy, Arthritis (2018), Breast cancer ([place]) (03-27-2020), Eczema, Family history of breast cancer, Family history of lung cancer, Family history of prostate cancer, and Hypertension.   Surgical History:   Past Surgical History:  Procedure Laterality Date   ABDOMINAL HYSTERECTOMY     CESAREAN SECTION     ERCP N/A 02/02/2021   Procedure: ENDOSCOPIC RETROGRADE CHOLANGIOPANCREATOGRAPHY (ERCP);  Surgeon: Carol Ada, MD;  Location: [facility];  Service: Endoscopy;  Laterality: N/A;   ESOPHAGOGASTRODUODENOSCOPY N/A 02/09/2021   Procedure: ESOPHAGOGASTRODUODENOSCOPY (EGD);  Surgeon: Carol Ada, MD;  Location: [facility];  Service: Endoscopy;  Laterality: N/A;   IR BILIARY DRAIN PLACEMENT WITH CHOLANGIOGRAM  02/03/2021   IR ENDOLUMINAL BX OF BILIARY TREE  02/10/2021   IR EXCHANGE BILIARY DRAIN  02/10/2021   KNEE ARTHROSCOPY W/ ACL RECONSTRUCTION Left    KNEE SURGERY     MODIFIED MASTECTOMY Right 12/07/2020   Procedure: RIGHT MODIFIED RADICAL MASTECTOMY;  Surgeon: Rolm Bookbinder, MD;  Location: [place];  Service: General;  Laterality: Right;   PORTACATH PLACEMENT Left 04/23/2020   Procedure: LEFT SIDE PORT PLACEMENT WITH ULTRASOUND GUIDANCE;  Surgeon: Rolm Bookbinder, MD;  Location: [facility];  Service: General;  Laterality: Left;  230PM START TIME PLEASE ROOM 2 THANKS   SPHINCTEROTOMY  02/02/2021   Procedure: SPHINCTEROTOMY;  Surgeon: Carol Ada, MD;  Location: [facility];  Service: Endoscopy;;   TUBAL  LIGATION     UPPER ESOPHAGEAL ENDOSCOPIC ULTRASOUND (EUS) N/A 02/09/2021   Procedure: UPPER ESOPHAGEAL ENDOSCOPIC ULTRASOUND (EUS);  Surgeon: Carol Ada, MD;  Location: [facility];  Service: Endoscopy;  Laterality: N/A;     Social History:   reports that she quit smoking about 3 years ago. Her smoking use included cigarettes. She has a 9.00 pack-year smoking history. She has never used smokeless tobacco. She reports current alcohol use of about 3.0 standard drinks per week. She reports that she does not use drugs.   Family History:  Her family history includes Arthritis in her mother; Breast cancer in her paternal grandmother; Breast cancer (age of onset: 6) in her mother; Breast cancer (age of onset: 53) in her maternal aunt; Diabetes in her father; Heart disease in her maternal aunt and maternal grandmother; Hypertension in her sister; Kidney disease in her father; Lung cancer in her maternal aunt; Other in her maternal aunt; Prostate cancer (age of onset: 63) in her cousin.   Allergies No Known  Allergies   Home Medications  Prior to Admission medications   Medication Sig Start Date End Date Taking? Authorizing Provider  acetaminophen (TYLENOL) 500 MG tablet Take 1,000 mg by mouth every 6 (six) hours as needed for moderate pain.   Yes [provider]  capecitabine (XELODA) 500 MG tablet Take 4 tablets (2,000 mg total) by mouth 2 (two) times daily after a meal. Take on days 1-14. Repeat every 21 days. 03/25/21  Yes Benay Pike, MD  Cholecalciferol (VITAMIN D) 125 MCG (5000 UT) CAPS Take 5,000 Units by mouth every morning.   Yes [provider]  clobetasol ointment (TEMOVATE) 8.63 % Apply 1 application topically 2 (two) times daily as needed (eczema).   Yes [provider]  ondansetron (ZOFRAN) 8 MG tablet Take 1 tablet (8 mg total) by mouth 2 (two) times daily as needed (Nausea or vomiting). 03/23/21  Yes Benay Pike, MD  oxyCODONE (OXY IR/ROXICODONE) 5  MG immediate release tablet Take 1 tablet (5 mg total) by mouth every 4 (four) hours as needed for severe pain. 03/23/21  Yes Causey, Charlestine Massed, NP  prochlorperazine (COMPAZINE) 10 MG tablet Take 1 tablet (10 mg total) by mouth every 6 (six) hours as needed (Nausea or vomiting). 03/23/21  Yes Benay Pike, MD  propranolol (INDERAL) 10 MG tablet Take 10 mg by mouth 2 (two) times daily. 06/08/20  Yes [provider]  spironolactone (ALDACTONE) 25 MG tablet Take 25 mg by mouth 2 (two) times daily. 08/14/20  Yes [provider]  Boric Acid Vaginal 600 MG SUPP Place 600 mg vaginally daily as needed (yeast infections). Patient not taking: Reported on 04/08/2021    [provider]  Cyanocobalamin (B-12) 5000 MCG CAPS Take 5,000 mcg by mouth every morning.    [provider]  loperamide (IMODIUM) 2 MG capsule Take 2 tablets after first diarrheal bowel movement, then one tablet after each following diarrheal movement; maximum 6 tablets/ day Patient taking differently: 2-4 mg See admin instructions. Take 2 tablets  (4 mg) by mouth after first diarrheal bowel movement, then one tablet (2 mg) after each following diarrheal movement; maximum 6 tablets/ day 10/22/20   Magrinat, Virgie Dad, MD  methocarbamol (ROBAXIN) 500 MG tablet Take 1 tablet (500 mg total) by mouth every 6 (six) hours as needed for muscle spasms. Patient not taking: Reported on 04/08/2021 12/08/20   Rolm Bookbinder, MD  neomycin-bacitracin-polymyxin (NEOSPORIN) OINT Apply 1 application topically 2 (two) times daily. Patient not taking: Reported on 04/08/2021 02/13/21   Kayleen Memos, DO  buPROPion (WELLBUTRIN XL) 300 MG 24 hr tablet Take 1 tablet (300 mg total) by mouth every morning. 10/04/13 02/17/20  [name], MD  losartan-hydrochlorothiazide [facility]) 100-12.5 MG per tablet Take 1/2 - 1 tab daily 10/04/13 02/17/20  Zanard, Bernadene Bell, MD  telmisartan-hydrochlorothiazide (MICARDIS HCT) 80-25 MG tablet Take  1 tablet by mouth daily.  02/17/20  [provider]     Critical care time:

## 2021-04-06 NOTE — Progress Notes (Addendum)
Progress Note   Patient: [name] MVE:[id number] DOB: December 01, 1965 DOA: 03/22/2021     1 DOS: the patient was seen and examined on 04/12/2021       Brief hospital course: Mrs. Poulsen is a 56 y.o. 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  1/23: Urology consulted, started on broad spectrum antibiotics. 1/24: Worsening HR, BP, mentation --> to OR for ureteral stents       Assessment and Plan * Severe sepsis ([place])- (present on admission) P/w tachycardia, tachypnea, encephalopathy.  Unclear source.  [place] pending, urine culture negative.  Don't think gallbladder because perc-drain seems to be draining.  COVID-, RVP-  - Repeat lactate - Bolus 1L LR now  - Broaden to Meropenem - Add vancomycin  - Follow blood cultures - To OR for ureteral stents now - Consult CCM given worsening hemodynamics   ADDENDUM:  Hypotensive in OR for stents, placed on pressors and transferred to ICU - CCM consulted    AKI (acute kidney injury) ([place]) Baseline creatinine 0.8.  Here up to 1.2, today 1.8 despite fluids.  Likely due to sepsis, possibly start. - Continue fluids - Trend creatinine - To the OR for stents   RIGHT leg swelling Pitting edema of right leg. - [place] leg to rule out DVT   Acute metabolic encephalopathy At baseline, aptietn is independent, has no cognitive impairment.  Here, she is confused this morning, oriented to place, but responses slow and inapparopriate.  Due to Sepsis.  Anemia associated with chemotherapy Hgb stable  relative to recent baseline.  No clinical bleeding.  Thrombocytopenia ([place]) In setting of cancer, worsening with sepsis. - Trend CBC - Trend coags  Hyperkalemia Due to AKI - IV fluids and trend  Malignant neoplasm of upper-outer quadrant of right breast in female, estrogen receptor positive ([place]) - Consult Oncology  Essential hypertension, benign- (present on admission) Hypotensive -Hold spironolactone, propranolol       Subjective: Overnight the patient has been persistently tachycardic despite fluids.  This morning she is restless, tachycardic, restless, encephalopathic.  No fever.  Appears ill.  Objective Signs reviewed, tachycardic to 130s, tachypneic to 30, blood pressure soft. Ill-appearing adult female, lying on her side, cognitive slowing noted, history of slow, difficult to respond to questions, inattentive.  Heart rate tachycardic, no murmurs, lung sounds diminished bilaterally, no rales or wheezing appreciated.  No focal abdominal tenderness.  She has pitting edema of the right leg, she is tachypneic and uncomfortable.  Moves upper extremities with symmetric strength but generalized weakness severe.  Speech fluent, face symmetric.  Data Reviewed: Discussed with urology and critical care. My review of labs and imaging is notable for elevated lactic acid, leukocytosis, thrombocytopenia blood count, mild anemia, stable from baseline, elevated alk phos, creatinine worsening on basic metabolic panel, new acidosis, hyponatremia and hyperkalemia.   CT of the abdomen and pelvis without contrast shows extensive peritoneal carcinomatosis.   INR 1.6.  Cortisol elevated.  Respiratory virus panel negative.   Urine culture negative.       Family Communication: Called to mother, no answer, left voicemail  Disposition: Status is: Inpatient  Remains  inpatient appropriate because: The patient is critically ill.  She is deteriorating and will need aggressive IV fluids, ureteral  stenting.  Despite that she may continue to worsen and require Levophed support.  Prognosis tenuous  The patient is critically ill with multi-organ failure.  Critical care was necessary to treat or prevent imminent or life-threatening deterioration of sepsis, renal failure and was exclusive of separately billable procedures and treating other patients. Total critical care time spent by me: 55 minutes Time spent personally by me on obtaining history from patient or surrogate, evaluation of the patient, evaluation of patient's response to treatment, ordering and review of laboratory studies, ordering and review of radiographic studies, ordering and performing treatments and interventions, and re-evaluation of the patient's condition.            Author: Edwin Dada, MD 03/28/2021 9:01 AM  For on call review [email or web address].

## 2021-04-06 NOTE — Progress Notes (Signed)
BLE venous duplex has been completed.  Critical findings given to Dr. Loleta Books through secure chat.  Results can be found under chart review under CV PROC. 03/27/2021 2:32 PM Aoki Wedemeyer RVT, RDMS

## 2021-04-06 NOTE — Progress Notes (Signed)
Patient to preop for emergent surgery. Patient septic- getting bolus. RR 36, tachy 120-130. Completed CHG, BP 80/40s. C/O severe pain in right leg- swollen and reddened, +2pitting edema   Informed Dr.Jackson.  In to see patient.   0930 Neo started at 25 mcg(18.6cc) Total of Fentanyl 50 mcg IV given. Maintaining BP 80s.

## 2021-04-06 NOTE — Assessment & Plan Note (Signed)
Due to AKI - IV fluids and trend

## 2021-04-06 NOTE — Anesthesia Postprocedure Evaluation (Signed)
Anesthesia Post Note  Patient: NADIRAH SOCORRO  Procedure(s) Performed: CYSTOSCOPY WITH STENT PLACEMENT retrograde pylegram (Bilateral: Ureter)     Patient location during evaluation: PACU Anesthesia Type: General Level of consciousness: awake and alert Pain management: pain level controlled Vital Signs Assessment: post-procedure vital signs reviewed and stable Respiratory status: spontaneous breathing, nonlabored ventilation, respiratory function stable and patient connected to nasal cannula oxygen Cardiovascular status: blood pressure returned to baseline and stable Postop Assessment: no apparent nausea or vomiting Anesthetic complications: no   No notable events documented.  Last Vitals:  Vitals:   03/29/2021 1358 03/22/2021 1400  BP: 92/76 105/88  Pulse: (!) 115 (!) 115  Resp: (!) 32 (!) 27  Temp:  (!) 36.1 C  SpO2: 92% 100%    Last Pain:  Vitals:   04/04/2021 1330  TempSrc:   PainSc: Asleep                 Annah Jasko L [place] Nies

## 2021-04-06 NOTE — Progress Notes (Signed)
°   03/22/2021 0800  Assess: MEWS Score  Temp 97.9 F (36.6 C)  BP 90/65  Pulse Rate (!) 130  ECG Heart Rate (!) 130  Resp (!) 33  Level of Consciousness Alert  SpO2 96 %  O2 Device Room Air  Assess: MEWS Score  MEWS Temp 0  MEWS Systolic 1  MEWS Pulse 3  MEWS RR 2  MEWS LOC 0  MEWS Score 6  MEWS Score Color Red  Assess: if the MEWS score is Yellow or Red  Were vital signs taken at a resting state? Yes  Focused Assessment No change from prior assessment  Does the patient meet 2 or more of the SIRS criteria? Yes  Does the patient have a confirmed or suspected source of infection? No  MEWS guidelines implemented *See Row Information* No, previously red, continue vital signs every 4 hours  Notify: Charge Nurse/RN  Name of Charge Nurse/RN Notified Marissa Long RN  Date Charge Nurse/RN Notified 03/15/2021  Time Charge Nurse/RN Notified [phone]  Assess: SIRS CRITERIA  SIRS Temperature  0  SIRS Pulse 1  SIRS Respirations  1  SIRS WBC 1  SIRS Score Sum  3

## 2021-04-06 NOTE — Progress Notes (Signed)
Day of Surgery Subjective: Patient reports pain in her right leg with right greater than left extremity swelling.  She continues to remain septic and creatinine increasing. I reviewed her CT scan from this admission.  The right hydronephrosis is approximately stable.  There is some mild left hydronephrosis not present on the CT scan from November. There is also persistent pancreatic head abnormality, as well as new exetensive omental infiltration, soft tissue nodularity c/w peritoneal carcinomatosis and a perigastric mass with pelvic adenopathy. Her urine output has been poor.  She was brought for urgent stenting.  Objective: Vital signs in last 24 hours: Temp:  [97.4 F (36.3 C)-97.9 F (36.6 C)] 97.9 F (36.6 C) (01/24 0800) Pulse Rate:  [32-131] 124 (01/24 1020) Resp:  [16-36] 36 (01/24 1030) BP: (84-128)/(38-94) 96/69 (01/24 1030) SpO2:  [89 %-100 %] 100 % (01/24 1020) Weight:  [78.7 kg] 78.7 kg (01/24 0905)  Intake/Output from previous day: 01/23 0701 - 01/24 0700 In: 2274.5 [P.O.:170; I.V.:1204.5; IV Piggyback:900] Out: 550 [Urine:60; Drains:490]  Intake/Output Summary (Last 24 hours) at 03/25/2021 1035 Last data filed at 04/09/2021 0600 Gross per 24 hour  Intake 2274.51 ml  Output 550 ml  Net 1724.51 ml    Intake/Output this shift: No intake/output data recorded.  Physical Exam:  In bed - alert and conversing. Alert and oriented. Better after bolus and neo. R>L LE swelling.   Lab Results: Recent Labs    03/27/2021 0024 04/01/2021 0322  HGB 10.7* 10.0*  HCT 33.2* 32.0*   BMET Recent Labs    04/10/2021 0024 04/04/2021 0322  NA 130* 130*  K 4.1 5.2*  CL 95* 99  CO2 20* 17*  GLUCOSE 162* 158*  BUN 64* 78*  CREATININE 1.26* 1.80*  CALCIUM 9.5 8.2*   Recent Labs    03/29/2021 0024 03/16/2021 0322  INR 1.4* 1.6*   No results for input(s): LABURIN in the last 72 hours. Results for orders placed or performed during the hospital encounter of 03/18/2021  Resp Panel by  RT-PCR (Flu A&B, Covid) Nasopharyngeal Swab     Status: None   Collection Time: 03/16/2021 12:24 AM   Specimen: Nasopharyngeal Swab; Nasopharyngeal(NP) swabs in vial transport medium  Result Value Ref Range Status   SARS Coronavirus 2 by RT PCR NEGATIVE NEGATIVE Final    Comment: (NOTE) SARS-CoV-2 target nucleic acids are NOT DETECTED.  The SARS-CoV-2 RNA is generally detectable in upper respiratory specimens during the acute phase of infection. The lowest concentration of SARS-CoV-2 viral copies this assay can detect is 138 copies/mL. A negative result does not preclude SARS-Cov-2 infection and should not be used as the sole basis for treatment or other patient management decisions. A negative result may occur with  improper specimen collection/handling, submission of specimen other than nasopharyngeal swab, presence of viral mutation(s) within the areas targeted by this assay, and inadequate number of viral copies(<138 copies/mL). A negative result must be combined with clinical observations, patient history, and epidemiological information. The expected result is Negative.  Fact Sheet for Patients:  EntrepreneurPulse.com.au  Fact Sheet for Healthcare Providers:  IncredibleEmployment.be  This test is no t yet approved or cleared by the [place] FDA and  has been authorized for detection and/or diagnosis of SARS-CoV-2 by FDA under an Emergency Use Authorization (EUA). This EUA will remain  in effect (meaning this test can be used) for the duration of the COVID-19 declaration under Section 564(b)(1) of the Act, 21 U.S.C.section 360bbb-3(b)(1), unless the authorization is terminated  or revoked  sooner.       Influenza A by PCR NEGATIVE NEGATIVE Final   Influenza B by PCR NEGATIVE NEGATIVE Final    Comment: (NOTE) The Xpert Xpress SARS-CoV-2/FLU/RSV plus assay is intended as an aid in the diagnosis of influenza from Nasopharyngeal swab  specimens and should not be used as a sole basis for treatment. Nasal washings and aspirates are unacceptable for Xpert Xpress SARS-CoV-2/FLU/RSV testing.  Fact Sheet for Patients: EntrepreneurPulse.com.au  Fact Sheet for Healthcare Providers: IncredibleEmployment.be  This test is not yet approved or cleared by the [place] FDA and has been authorized for detection and/or diagnosis of SARS-CoV-2 by FDA under an Emergency Use Authorization (EUA). This EUA will remain in effect (meaning this test can be used) for the duration of the COVID-19 declaration under Section 564(b)(1) of the Act, 21 U.S.C. section 360bbb-3(b)(1), unless the authorization is terminated or revoked.  Performed at [facility], [place] [address] [place], [place] [address]   Blood Culture (routine x 2)     Status: None (Preliminary result)   Collection Time: 04/11/2021 12:24 AM   Specimen: BLOOD  Result Value Ref Range Status   Specimen Description   Final    BLOOD LEFT WRIST Performed at [facility] [address], [place], [place] [address]    Special Requests   Final    BOTTLES DRAWN AEROBIC AND ANAEROBIC Blood Culture adequate volume Performed at [facility] [address] [place], [place] [address]    Culture   Final    NO GROWTH 1 DAY Performed at [facility], [place] [address], [place], [place] [address]    Report Status PENDING  Incomplete  Urine Culture     Status: None   Collection Time: 03/24/2021 12:24 AM   Specimen: In/Out Cath Urine  Result Value Ref Range Status   Specimen Description   Final    IN/OUT CATH URINE Performed at [facility] [address], [place], [place] [address]    Special Requests   Final    NONE Performed at [facility], [place] [address] [place], [place] [address]    Culture   Final    NO GROWTH Performed at [facility], [place] [address], [place], [place] [address]    Report Status 04/01/2021 FINAL  Final  Blood Culture (routine x 2)     Status: None (Preliminary result)   Collection Time: 03/31/2021 12:29 AM   Specimen: BLOOD  Result Value Ref Range Status   Specimen Description   Final    BLOOD LEFT WRIST Performed at [facility] [address], [place], [place] [address]    Special Requests   Final    BOTTLES DRAWN AEROBIC AND ANAEROBIC Blood Culture adequate volume Performed at [facility] [address] [place], [place] [address]    Culture   Final    NO GROWTH 1 DAY Performed at [facility], [place] [address], [place], [place] [address]    Report Status PENDING  Incomplete  Respiratory (~20 pathogens) panel by PCR     Status: None   Collection Time: 03/21/2021  7:42 AM   Specimen: Nasopharyngeal Swab; Respiratory  Result Value Ref Range Status   Adenovirus NOT DETECTED NOT DETECTED Final   Coronavirus 229E NOT DETECTED NOT DETECTED Final    Comment: (NOTE) The Coronavirus on the Respiratory Panel, DOES NOT test for the novel  Coronavirus (  2019 nCoV)    Coronavirus HKU1 NOT DETECTED NOT DETECTED Final   Coronavirus NL63 NOT DETECTED NOT DETECTED Final   Coronavirus OC43 NOT DETECTED NOT DETECTED Final   Metapneumovirus NOT DETECTED NOT DETECTED Final   Rhinovirus / Enterovirus NOT DETECTED NOT DETECTED Final   Influenza A NOT DETECTED NOT DETECTED Final   Influenza B NOT DETECTED NOT DETECTED Final   Parainfluenza Virus 1 NOT DETECTED NOT DETECTED Final   Parainfluenza Virus 2 NOT DETECTED NOT DETECTED Final   Parainfluenza Virus 3 NOT DETECTED NOT DETECTED Final   Parainfluenza Virus 4 NOT DETECTED NOT DETECTED Final   Respiratory Syncytial Virus NOT DETECTED NOT DETECTED Final   Bordetella pertussis NOT DETECTED NOT DETECTED Final   Bordetella Parapertussis NOT DETECTED NOT DETECTED Final   Chlamydophila pneumoniae NOT DETECTED NOT  DETECTED Final   Mycoplasma pneumoniae NOT DETECTED NOT DETECTED Final    Comment: Performed at [facility], [place] [address], [place], [place] [address]    Studies/Results: CT ABDOMEN PELVIS WO CONTRAST  Result Date: 03/14/2021 CLINICAL DATA:  Acute abdominal pain, history of biliary obstruction with stenting, breast cancer with bone metastasis, chronic pain, hypertension, presents with generalized weakness and dry cough for 1 week, fatigue. Found with acute kidney injury, tachycardia, elevated lactic acid EXAM: CT ABDOMEN AND PELVIS WITHOUT CONTRAST TECHNIQUE: Multidetector CT imaging of the abdomen and pelvis was performed following the standard protocol without IV contrast. RADIATION DOSE REDUCTION: This exam was performed according to the departmental dose-optimization program which includes automated exposure control, adjustment of the mA and/or kV according to patient size and/or use of iterative reconstruction technique. COMPARISON:  02/06/2021; correlation PET-CT 03/11/2021 FINDINGS: Lower chest: Peribronchial thickening and interstitial prominence at lung bases. Minimal bibasilar atelectasis. Hepatobiliary: Post PTC with biliary drain extending from anterior abdominal wall through liver, CBD, and into third portion of duodenum. Mild intrahepatic biliary dilatation. Gallbladder filled by dense material, unchanged, question prior contrast. Remainder of liver unremarkable. Pancreas: Enlargement and surrounding infiltrative changes at pancreatic head again seen. Pancreatic body and tail otherwise normal appearance. Spleen: Normal appearance Adrenals/Urinary Tract: Adrenal glands normal appearance. Persistent RIGHT hydronephrosis and proximal hydroureter, distal ureter decompressed. Mild LEFT hydronephrosis without ureteral dilatation. Bladder wall appears thickened but bladder is underdistended question artifact. Stomach/Bowel: Stomach unremarkable. No bowel dilatation or wall thickening. Slightly  prominent gas within sigmoid loop. Vascular/Lymphatic: Atherosclerotic calcifications aorta and iliac arteries without aneurysm. BILATERAL pelvic adenopathy. Reproductive: Uterus surgically absent with nonvisualization of ovaries Other: Increase in size of fluid collection at lesser sac now 1.0 x 2.5 cm. Scattered ascites perihepatic, perisplenic, pelvis. Extensive omental infiltration and peritoneal soft tissue nodularity consistent with peritoneal carcinomatosis. Increase in size of a perigastric mass 3.2 x 2.3 cm image 34. Musculoskeletal: Numerous osseous metastases, both lytic and sclerotic. IMPRESSION: Post PTC with biliary drain extending from anterior abdominal wall through liver, CBD, and into third portion of duodenum with mild intrahepatic biliary dilatation. Persistent RIGHT and mild LEFT hydronephrosis without ureteral dilatation, question due to UPJ obstruction on LEFT and mid ureteral obstruction on RIGHT Extensive omental infiltration and peritoneal soft tissue nodularity consistent with peritoneal carcinomatosis, progressive since prior exam. Increase in size of a perigastric mass and BILATERAL pelvic adenopathy. Numerous osseous metastases. Increased loculated fluid collection at lesser sac. Aortic Atherosclerosis (ICD10-I70.0). Electronically Signed   By: Lavonia Dana M.D.   On: 04/10/2021 11:11   DG Chest Port 1 View  Result Date: 04/10/2021 CLINICAL DATA:  Sepsis EXAM: PORTABLE CHEST 1  VIEW COMPARISON:  CT 12/29/2020 FINDINGS: Mild elevation of the right hemidiaphragm is unchanged. Lungs are clear. No pneumothorax or pleural effusion. Cardiac size within normal limits. Left internal jugular chest port tip seen within the superior cavoatrial junction. Sclerotic metastasis with associated pathologic fracture noted within the right fourth and sixth ribs. Healed left fourth rib fracture. No acute bone abnormality. IMPRESSION: No active disease. Electronically Signed   By: Fidela Salisbury M.D.    On: 03/20/2021 02:04    Assessment/Plan: -sepsis, bilateral hydronephrosis - I discussed with the patient the nature, potential benefits, risks and alternatives to cystoscopy with bilateral retrograde bilateral ureteral stent placement, Foley catheter placement, including side effects of the proposed treatment, the likelihood of the patient achieving the goals of the procedure, and any potential problems that might occur during the procedure or recuperation.  We discussed possible failure to gain retrograde access.  All questions answered. Patient elects to proceed.    LOS: 1 day   Festus Aloe 03/19/2021, 10:34 AM

## 2021-04-06 NOTE — Assessment & Plan Note (Signed)
Hypotensive -Hold spironolactone, propranolol

## 2021-04-06 NOTE — Anesthesia Preprocedure Evaluation (Addendum)
Anesthesia Evaluation  Patient identified by MRN, date of birth, ID band Patient awake    Reviewed: Allergy & Precautions, NPO status , Patient's Chart, lab work & pertinent test results, reviewed documented beta blocker date and time   History of Anesthesia Complications Negative for: history of anesthetic complications  Airway Mallampati: II  TM Distance: >3 FB Neck ROM: Full    Dental  (+) Dental Advisory Given, Teeth Intact   Pulmonary former smoker,  04/10/2021 SARS coronavirus NEG   breath sounds clear to auscultation       Cardiovascular hypertension, Pt. on medications and Pt. on home beta blockers (-) angina Rhythm:Regular Rate:Tachycardia  hypotensive   Neuro/Psych negative neurological ROS     GI/Hepatic negative GI ROS, Neg liver ROS,   Endo/Other    Renal/GU Renal InsufficiencyRenal diseaseuroseptic     Musculoskeletal  (+) Arthritis ,   Abdominal   Peds  Hematology  (+) Blood dyscrasia (Hb 10.0), anemia , plt 75k INR 1.6   Anesthesia Other Findings Breast cancer  Reproductive/Obstetrics                            Anesthesia Physical Anesthesia Plan  ASA: 4  Anesthesia Plan: General   Post-op Pain Management: Ofirmev IV (intra-op)   Induction:   PONV Risk Score and Plan: 3 and Ondansetron, Dexamethasone and Scopolamine patch - Pre-op  Airway Management Planned: Oral ETT  Additional Equipment: None  Intra-op Plan:   Post-operative Plan: Extubation in OR  Informed Consent: I have reviewed the patients History and Physical, chart, labs and discussed the procedure including the risks, benefits and alternatives for the proposed anesthesia with the patient or authorized representative who has indicated his/her understanding and acceptance.     Dental advisory given  Plan Discussed with: CRNA and Surgeon  Anesthesia Plan Comments:        Anesthesia Quick  Evaluation

## 2021-04-06 NOTE — Progress Notes (Signed)
ANTICOAGULATION CONSULT NOTE - follow up  Pharmacy Consult for heparin Indication: DVT  No Known Allergies  Patient Measurements: Height: 5\' 10"  (177.8 cm) Weight: 86.5 kg (190 lb 11.2 oz) IBW/kg (Calculated) : 68.5 Heparin Dosing Weight: 78.7 kg  Vital Signs: Temp: 97.7 F (36.5 C) (01/24 2026) Temp Source: Axillary (01/24 2026) BP: 95/69 (01/24 2220) Pulse Rate: 119 (01/24 2220)  Labs: Recent Labs    04/08/2021 0024 03/25/2021 0322 03/22/2021 1532 03/23/2021 2213  HGB 10.7* 10.0*  --   --   HCT 33.2* 32.0*  --   --   PLT 110* 75*  --   --   APTT 29  --   --   --   LABPROT 16.8* 19.3*  --   --   INR 1.4* 1.6*  --   --   HEPARINUNFRC  --   --   --  1.05*  CREATININE 1.26* 1.80*  --   --   TROPONINIHS  --   --  71*  --      Estimated Creatinine Clearance: 42.2 mL/min (A) (by C-G formula based on SCr of 1.8 mg/dL (H)).   Medical History: Past Medical History:  Diagnosis Date   Allergy    Arthritis 2018   hands, left knee   Breast cancer ([place]) 03-27-2020   right breast IMC   Eczema    Family history of breast cancer    Family history of lung cancer    Family history of prostate cancer    Hypertension     Medications:  Scheduled:   Barium Sulfate  450 mL Oral BID   Chlorhexidine Gluconate Cloth  6 each Topical Daily   cholecalciferol  5,000 Units Oral Daily   mouth rinse  15 mL Mouth Rinse BID   sodium chloride flush  10-40 mL Intracatheter Q12H   vitamin B-12  5,000 mcg Oral Daily    Assessment: 56 yo female with notable PMH including breast cancer with bone metastases on chemo and biliary drain placement presenting with generalized weakness, fatigue, nausea, unable to move around.  Noted to have R leg swelling on 1/24 and dopplers performed.  No on AC currently due to low platelets.   - 1/24 Dopplers: findings consistent in both R and L legs for DVT of common femoral vein, SF junction, femoral vein, proximal profunda vein, popliteal vein, posterior tibial  veins, and peroneal veins.  Baseline labs: aPTT 29, INR 1.4, Hgb 10.7  Today, 03/30/2021 - Platelets 110 > 75 likely due to sepsis presentation and chemotherapy > no bleeding reported - Hemoglobin stable 10's - 1st HL 1.05 supra-therapeutic on 1200 units/hr - per RN level drawn from port, heparin running peripherally.  No bleeding noted  Goal of Therapy:  Heparin level 0.3-0.7 units/ml Monitor platelets by anticoagulation protocol: Yes   Plan:  Hold heparin x 1 hour then decrease heparin drip to 1000 units/hr Check heparin level in 6 hours Follow-up daily CBC and heparin level F/u ability to transition to PO anticoagulant  [place] 03/28/2021, 10:52 PM

## 2021-04-06 NOTE — Progress Notes (Addendum)
°   03/25/2021 0525  Vitals  Temp 97.7 F (36.5 C)  Temp Source Oral  BP 117/86  MAP (mmHg) 94  BP Location Left Arm  BP Method Automatic  Patient Position (if appropriate) Lying  Pulse Rate (!) 128  Pulse Rate Source Monitor  ECG Heart Rate (!) 128  Resp (!) 26  Level of Consciousness  Level of Consciousness Alert  MEWS COLOR  MEWS Score Color Red  Oxygen Therapy  SpO2 95 %  O2 Device Room Air  Pain Assessment  Pain Scale 0-10  Pain Score 10  Pain Type Acute pain  Pain Location Leg  Pain Orientation Right;Left  Pain Descriptors / Indicators Aching;Constant  Pain Frequency Constant  Pain Onset On-going  Patients Stated Pain Goal 6  Pain Intervention(s) Medication (See eMAR)  MEWS Score  MEWS Temp 0  MEWS Systolic 0  MEWS Pulse 2  MEWS RR 2  MEWS LOC 0  MEWS Score 4  Provider Notification  Provider Name/Title J. Olena Heckle  Date Provider Notified 03/16/2021  Time Provider Notified 0532  Notification Type Page (Secure Chat)  Notification Reason Other (Comment) (Urine output 10 ml)  Provider response Other (Comment) (Monitor pt)  Date of Provider Response 04/12/2021  Time of Provider Response 0540   Notified NP Olena Heckle that pt has 10 ml urine output thru the night, no new orders and will continue to monitor intake and output.  Pt refusing bladder scan due to being unable to lay flat for the time the procedure would take.

## 2021-04-06 NOTE — Assessment & Plan Note (Signed)
Baseline creatinine 0.8.  Here up to 1.2, today 1.8 despite fluids.  Likely due to sepsis, possibly start. - Continue fluids - Trend creatinine - To the OR for stents

## 2021-04-06 NOTE — Progress Notes (Signed)
MD aware of Red MEWS status and has rounded on Patient. New orders have been placed. LR bolus hung prior to Patient being taken to OR.

## 2021-04-06 NOTE — Progress Notes (Signed)
Lactic acid was not collected by this IV nurse, pt went to OR. RN aware, and will place order for DBIV when pt comes back to the unit.

## 2021-04-06 NOTE — Consult Note (Signed)
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  In ER was tachycardic, had leukocytosis and AKI. CT abd/pelvis showed PTC biliary drain through liver, CBD and into third portion of duodenum w/ mild intrahepatic biliary dilatation, persistent Right and mild left hydronephrosis. W/ possible UPJ obstruction on the left and mild ureteral obstruction on right. Also had extensive omental infiltrate of peritoneum. Was admitted w/ working dx of sepsis felt either abd or urinary tract source. Cultures sent. ABX started.   PCCM asked to eval on 1/24 for refractory hypotension (see hospital course outlined below)   Pertinent  Medical History  Breast cancer, BrCA triple negative w/ mets to chest and retroperitoneal LNs, bones, and biliary obstruction w/ chronic drain placement.  Xeloda started as had disease progression on adjuvant immunotherapy and lact of targetable mutations.  HTN Significant Hospital Events: Including procedures, antibiotic start and stop dates in addition to other pertinent events   1/23 admitted w/ working dx of sepsis felt either abd or urinary tract source. Cultures sent. ABX started cefepime and flagyl. Urology consulted for bilateral hydro w/ AKI felt left was new and right was more chronic and recommended watching initially.  1/24 remained tachycardic. SBP as low as 80s. Remained Hypotensive in spite of Volume resuscitation w/ crystalloids. Abx changed to meropenem and zyvox. Decision made to bring to OR for bilateral ureteral stent placement. PCCM asked to assist w/ care for septic shock   Interim History / Subjective:  Slow to respond. No distress but still on pressors   Objective   Blood pressure  93/73, pulse (Abnormal) 116, temperature (Abnormal) 97.4 F (36.3 C), resp. rate (Abnormal) 28, height '5\' 10"'  (1.778 m), weight 78.7 kg, SpO2 100 %.        Intake/Output Summary (Last 24 hours) at 04/13/2021 1207 Last data filed at 04/04/2021 1138 Gross per 24 hour  Intake 3374.51 ml  Output 550 ml  Net 2824.51 ml   Filed Weights   03/23/2021 0125 04/12/2021 0905  Weight: 78.7 kg 78.7 kg    Examination: General: critically ill , still on pressors. Does have some word finding difficulty  HENT: NCAT no JVD Lungs: clear to auscultation  Cardiovascular: RRR Abdomen: soft not tender currently  Extremities: warm pitting edema c/o right LE pain  Neuro: awake. Confused. Oriented X 1-2. 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 May need further central access for CVP monitoring Day 2 abx; day 1 zyvox and meropenem. Await cultures and narrow as indicated  Hold inderal, aldactone Tele  ICU care Trend wbcs  AKI in setting of bilateral hydronephrosis (right chronic/left acute); further  c/b sepsis and end-organ hypoperfusion  S/p Bilateral ureteral stent placement 1/24 w/ op report noting left UPJ 2/2 external compression  -scr rising prior to surgery  Plan Treating sepsis Now s/p bilateral stent placement, cont strict  I&O Serial chems  Renal dose meds  Lactic acidosis in setting of sepsis  Plan Treat shock  Serial lactates  Acute metabolic encephalopathy 2/2 sepsis Plan Supportive care Holding sedating meds  Fluid and electrolyte imbalance: hyperkalemia, hyponatremia,  Plan Repeat serial chems Ck abg  Anemia w/out evidence of bleeding Plan Trend cbc  Transfuse for hgb < 7   Thrombocytopenia 2/2 sepsis Plan Trend   Chronic biliary obstruction w/ bilary stents  Plan Trend LFT and PCT output  Hyperglycemia Plan SSI   Widely metastatic Breast cancer, BrCA triple negative w/ mets to chest and retroperitoneal LNs, bones, and biliary. Now probably causing hydronephrosis as well Plan ONC follow up  It is concerning to me that there has been no discussion of goals of care in spite of clear disease progression and multiple organ system involvement Would be wise to get palliative care consult    Best Practice (right click and "Reselect all SmartList Selections" daily)   Diet/type: NPO DVT prophylaxis: SCD GI prophylaxis: N/A Lines: Central line and yes and it is still needed Foley:  Yes, and it is still needed Code Status:  full code Last date of multidisciplinary goals of care discussion [per primary ]  Labs   CBC: Recent Labs  Lab 03/22/2021 0024 03/15/2021 0322  WBC 11.8* 13.3*  NEUTROABS 10.1*  --   HGB 10.7* 10.0*  HCT 33.2* 32.0*  MCV 92.0 93.6  PLT 110* 75*    Basic Metabolic Panel: Recent Labs  Lab 03/22/2021 0024 03/18/2021 0322  NA 130* 130*  K 4.1 5.2*  CL 95* 99  CO2 20* 17*  GLUCOSE 162* 158*  BUN 64* 78*  CREATININE 1.26* 1.80*  CALCIUM 9.5 8.2*   GFR: Estimated Creatinine Clearance: 38.2 mL/min (A) (by C-G formula  based on SCr of 1.8 mg/dL (H)). Recent Labs  Lab 03/25/2021 0024 03/14/2021 0036 04/02/2021 0447 04/01/2021 2003 03/18/2021 0322  PROCALCITON  --   --   --   --  1.59  WBC 11.8*  --   --   --  13.3*  LATICACIDVEN  --  3.8* 2.3* 2.0*  --     Liver Function Tests: Recent Labs  Lab 04/11/2021 0024 04/11/2021 0322  AST 54* 65*  ALT 25 28  ALKPHOS 259* 242*  BILITOT 1.0 1.2  PROT 7.5 6.9  ALBUMIN 2.9* 2.6*   Recent Labs  Lab 03/25/2021 0120  LIPASE 75*   No results for input(s): AMMONIA in the last 168 hours.  ABG No results found for: PHART, PCO2ART, PO2ART, HCO3, TCO2, ACIDBASEDEF, O2SAT   Coagulation Profile: Recent Labs  Lab 04/03/2021 0024 03/16/2021 0322  INR 1.4* 1.6*    Cardiac Enzymes: No results for input(s): CKTOTAL, CKMB, CKMBINDEX, TROPONINI in the last 168 hours.  HbA1C: No results found for: HGBA1C  CBG: No results for input(s): GLUCAP in the last 168 hours.  Review of Systems:  Not able   Past Medical History:  She,  has a past medical history of Allergy, Arthritis (2018), Breast cancer ([place]) (03-27-2020), Eczema, Family history of breast cancer, Family history of lung cancer, Family history of prostate cancer, and Hypertension.   Surgical History:   Past Surgical History:  Procedure Laterality Date   ABDOMINAL HYSTERECTOMY     CESAREAN SECTION     ERCP N/A 02/02/2021   Procedure: ENDOSCOPIC RETROGRADE CHOLANGIOPANCREATOGRAPHY (ERCP);  Surgeon: Carol Ada, MD;  Location: [facility];  Service: Endoscopy;  Laterality: N/A;   ESOPHAGOGASTRODUODENOSCOPY N/A 02/09/2021   Procedure: ESOPHAGOGASTRODUODENOSCOPY (EGD);  Surgeon: Carol Ada, MD;  Location: [facility];  Service: Endoscopy;  Laterality: N/A;   IR BILIARY DRAIN PLACEMENT WITH CHOLANGIOGRAM  02/03/2021   IR ENDOLUMINAL BX OF BILIARY TREE  02/10/2021   IR EXCHANGE BILIARY DRAIN  02/10/2021   KNEE ARTHROSCOPY W/ ACL RECONSTRUCTION Left    KNEE SURGERY     MODIFIED MASTECTOMY Right 12/07/2020    Procedure: RIGHT MODIFIED RADICAL MASTECTOMY;  Surgeon: Rolm Bookbinder, MD;  Location: [facility];  Service: General;  Laterality: Right;   PORTACATH PLACEMENT Left 04/23/2020   Procedure: LEFT SIDE PORT PLACEMENT WITH ULTRASOUND GUIDANCE;  Surgeon: Rolm Bookbinder, MD;  Location: [facility];  Service: General;  Laterality: Left;  230PM START TIME PLEASE ROOM 2 THANKS   SPHINCTEROTOMY  02/02/2021   Procedure: SPHINCTEROTOMY;  Surgeon: Carol Ada, MD;  Location: [facility];  Service: Endoscopy;;   TUBAL LIGATION     UPPER ESOPHAGEAL ENDOSCOPIC ULTRASOUND (EUS) N/A 02/09/2021   Procedure: UPPER ESOPHAGEAL ENDOSCOPIC ULTRASOUND (EUS);  Surgeon: Carol Ada, MD;  Location: [facility];  Service: Endoscopy;  Laterality: N/A;     Social History:   reports that she quit smoking about 3 years ago. Her smoking use included cigarettes. She has a 9.00 pack-year smoking history. She has never used smokeless tobacco. She reports current alcohol use of about 3.0 standard drinks per week. She reports that she does not use drugs.   Family History:  Her family history includes Arthritis in her mother; Breast cancer in her paternal grandmother; Breast cancer (age of onset: 24) in her mother; Breast cancer (age of onset: 13) in her maternal aunt; Diabetes in her father; Heart disease in her maternal aunt and maternal grandmother; Hypertension in her sister; Kidney disease in her father; Lung cancer in her maternal aunt; Other in her maternal aunt; Prostate cancer (age of onset: 49) in her cousin.   Allergies No Known Allergies   Home Medications  Prior to Admission medications   Medication Sig Start Date End Date Taking? Authorizing Provider  acetaminophen (TYLENOL) 500 MG tablet Take 1,000 mg by mouth every 6 (six) hours as needed for moderate pain.   Yes [provider]  capecitabine (XELODA) 500 MG tablet Take 4 tablets (2,000 mg total) by mouth 2 (two)  times daily after a meal. Take on days 1-14. Repeat every 21 days. 03/25/21  Yes Benay Pike, MD  Cholecalciferol (VITAMIN D) 125 MCG (5000 UT) CAPS Take 5,000 Units by mouth every morning.   Yes [provider]  clobetasol ointment (TEMOVATE) 6.21 % Apply 1 application topically 2 (two) times daily as needed (eczema).   Yes [provider]  ondansetron (ZOFRAN) 8 MG tablet Take 1 tablet (8 mg total) by mouth 2 (two) times daily as needed (Nausea or vomiting). 03/23/21  Yes Iruku, Arletha Pili, MD  oxyCODONE (OXY IR/ROXICODONE) 5 MG immediate release tablet Take 1  tablet (5 mg total) by mouth every 4 (four) hours as needed for severe pain. 03/23/21  Yes Causey, Charlestine Massed, NP  prochlorperazine (COMPAZINE) 10 MG tablet Take 1 tablet (10 mg total) by mouth every 6 (six) hours as needed (Nausea or vomiting). 03/23/21  Yes Benay Pike, MD  propranolol (INDERAL) 10 MG tablet Take 10 mg by mouth 2 (two) times daily. 06/08/20  Yes [provider]  spironolactone (ALDACTONE) 25 MG tablet Take 25 mg by mouth 2 (two) times daily. 08/14/20  Yes [provider]  Boric Acid Vaginal 600 MG SUPP Place 600 mg vaginally daily as needed (yeast infections). Patient not taking: Reported on 03/29/2021    [provider]  Cyanocobalamin (B-12) 5000 MCG CAPS Take 5,000 mcg by mouth every morning.    [provider]  loperamide (IMODIUM) 2 MG capsule Take 2 tablets after first diarrheal bowel movement, then one tablet after each following diarrheal movement; maximum 6 tablets/ day Patient taking differently: 2-4 mg See admin instructions. Take 2 tablets  (4 mg) by mouth after first diarrheal bowel movement, then one tablet (2 mg) after each following diarrheal movement; maximum 6 tablets/ day 10/22/20   Magrinat, Virgie Dad, MD  methocarbamol (ROBAXIN) 500 MG tablet Take 1 tablet (500 mg total) by mouth every 6 (six) hours as needed for muscle spasms. Patient not taking:  Reported on 03/15/2021 12/08/20   Rolm Bookbinder, MD  neomycin-bacitracin-polymyxin (NEOSPORIN) OINT Apply 1 application topically 2 (two) times daily. Patient not taking: Reported on 04/11/2021 02/13/21   Kayleen Memos, DO  buPROPion (WELLBUTRIN XL) 300 MG 24 hr tablet Take 1 tablet (300 mg total) by mouth every morning. 10/04/13 02/17/20  [name], MD  losartan-hydrochlorothiazide [facility]) 100-12.5 MG per tablet Take 1/2 - 1 tab daily 10/04/13 02/17/20  Zanard, Bernadene Bell, MD  telmisartan-hydrochlorothiazide (MICARDIS HCT) 80-25 MG tablet Take 1 tablet by mouth daily.  02/17/20  [provider]     Critical care time: 32 minutes.      Erick Colace ACNP-BC [place] Pager # [phone] OR # [phone] if no answer

## 2021-04-06 NOTE — Progress Notes (Signed)
°   03/17/2021 1954  Assess: MEWS Score  Temp (!) 97.5 F (36.4 C)  BP 107/82  Pulse Rate (!) 126  ECG Heart Rate (!) 126  Resp (!) 26  Level of Consciousness Alert  SpO2 96 %  O2 Device Room Air  Assess: MEWS Score  MEWS Temp 0  MEWS Systolic 0  MEWS Pulse 2  MEWS RR 2  MEWS LOC 0  MEWS Score 4  MEWS Score Color Red  Assess: if the MEWS score is Yellow or Red  Were vital signs taken at a resting state? Yes  Focused Assessment Change from prior assessment (see assessment flowsheet)  Does the patient meet 2 or more of the SIRS criteria? Yes  Does the patient have a confirmed or suspected source of infection? No  MEWS guidelines implemented *See Row Information* Yes  Treat  Pain Scale 0-10  Pain Score 10  Pain Type Acute pain  Pain Location Leg  Pain Orientation Right;Left  Pain Descriptors / Indicators Aching;Constant  Pain Frequency Constant  Pain Onset On-going  Patients Stated Pain Goal 6  Pain Intervention(s) Medication (See eMAR)  Take Vital Signs  Increase Vital Sign Frequency  Red: Q 1hr X 4 then Q 4hr X 4, if remains red, continue Q 4hrs  Escalate  MEWS: Escalate Red: discuss with charge nurse/RN and provider, consider discussing with RRT  Notify: Charge Nurse/RN  Name of Charge Nurse/RN Notified Kristine, RN  Date Charge Nurse/RN Notified 03/20/2021  Time Charge Nurse/RN Notified 1958  Notify: Provider  Provider Name/Title J. Olena Heckle  Date Provider Notified 04/08/2021  Time Provider Notified 2016  Notification Type Page (secure chat)  Notification Reason Other (Comment) (Red mews)  Provider response Other (Comment) (monitor pt)  Date of Provider Response 03/30/2021  Time of Provider Response 2018  Notify: Rapid Response  Name of Rapid Response RN Notified Mandy, RN  Date Rapid Response Notified 03/24/2021  Time Rapid Response Notified 2030  Assess: SIRS CRITERIA  SIRS Temperature  0  SIRS Pulse 1  SIRS Respirations  1  SIRS WBC 0  SIRS Score Sum  2   Pt  alert, c/o constant pain on her bilateral lower legs, given PRN pain meds, MD notified and will continue to monitor pt.

## 2021-04-06 NOTE — Anesthesia Procedure Notes (Signed)
Procedure Name: Intubation Date/Time: 04/08/2021 10:49 AM Performed by: Raenette Rover, CRNA Pre-anesthesia Checklist: Patient identified, Emergency Drugs available, Suction available and Patient being monitored Patient Re-evaluated:Patient Re-evaluated prior to induction Oxygen Delivery Method: Circle system utilized Preoxygenation: Pre-oxygenation with 100% oxygen Induction Type: IV induction Ventilation: Mask ventilation without difficulty Laryngoscope Size: Mac and 3 Grade View: Grade I Tube type: Oral Tube size: 7.0 mm Number of attempts: 1 Airway Equipment and Method: Stylet Placement Confirmation: ETT inserted through vocal cords under direct vision, positive ETCO2 and breath sounds checked- equal and bilateral Secured at: 22 cm Tube secured with: Tape Dental Injury: Teeth and Oropharynx as per pre-operative assessment

## 2021-04-06 NOTE — Transfer of Care (Signed)
Immediate Anesthesia Transfer of Care Note  Patient: [name]  Procedure(s) Performed: CYSTOSCOPY WITH STENT PLACEMENT retrograde pylegram (Bilateral: Ureter)  Patient Location: PACU  Anesthesia Type:General  Level of Consciousness: awake and patient cooperative  Airway & Oxygen Therapy: Patient Spontanous Breathing and Patient connected to face mask oxygen  Post-op Assessment: Report given to RN and Post -op Vital signs reviewed and stable  Post vital signs: Reviewed and stable--pt on phenylephrine gtt being titrated by PACU RN  Last Vitals:  Vitals Value Taken Time  BP 83/64 03/14/2021 1143  Temp    Pulse 118 03/25/2021 1142  Resp 28 03/26/2021 1142  SpO2 100 % 03/25/2021 1142  Vitals shown include unvalidated device data.  Last Pain:  Vitals:   03/27/2021 1030  TempSrc:   PainSc: 9       Patients Stated Pain Goal: 6 (00/86/76 1950)  Complications: No notable events documented.

## 2021-04-06 NOTE — Op Note (Addendum)
Preoperative diagnosis: Bilateral hydronephrosis, sepsis Postoperative diagnosis: Same  Procedure: Cystoscopy with bilateral retrograde pyelogram and bilateral ureteral stent placement  Surgeon: Junious Silk  Anesthesia: General  Indication for procedure: Ms. Maestre is a 56 year old female with metastatic breast cancer.  She had known right hydronephrosis and her kidney function had stabilized and she was asymptomatic and this was monitored.  She presented with sepsis and has persistent right hydronephrosis and new mild left hydronephrosis.  She had worsening metastatic disease as well as right lower extremity swelling.  She is brought for urgent stents.  Findings: Vulva was normal.  Meatus appeared normal.  On cystoscopy the urethra and the bladder were unremarkable.  No stone or foreign body in the bladder.  Trigone and ureteral orifice ease appeared normal.  Right retrograde pyelogram-this outlined a normal distal and mid ureter with the entire proximal ureter up through the retroperitoneum was thin and compressed from external factors.  There was right moderate dilation of the collecting system and calyces.  Left retrograde pyelogram-this outlined a single ureter single collecting system unit with a normal distal mid and proximal ureter but there was narrowing at the UPJ that look like from compression.  There was mild dilation of the collecting system and pelvis.  Description of procedure: After consent was obtained patient brought to the operating room.  After adequate anesthesia she placed lithotomy position and prepped and draped in the usual fashion.  Timeout was performed to confirm the patient and procedure.  Cystoscope was passed per urethra and the bladder inspected.  Right ureteral orifice was cannulated with the 6 [place] open-ended catheter and right retrograde injection of contrast was performed.  Sensor wire was advanced into an upper calyx and a 626 cm stent placed wire was removed and  a good coil seen in the upper calyx and a good coil in the bladder.    Left ureteral orifice was cannulated with the 6 [place] open-ended catheter and right retrograde injection of contrast was performed.  Sensor wire was advanced into an upper calyx and a 6x26 cm stent placed wire was removed and a good coil seen in the upper calyx and a good coil in the bladder.    A 16 French Foley catheter was placed in left to gravity drainage.  She was awakened and taken to the recovery room in guarded condition.  Complications: None  Blood loss: Minimal  Specimens: None  Drains: 6 x 26 cm bilateral ureteral stents and a 16 French Foley catheter  Disposition: Patient on pressure support to PACU and to ICU.  I spoke with her mom Hassan Rowan he was caring for Latorsha's house and may visit later.  We discussed the procedure and the critical nature of her daughter's situation. I also communicated with Dr. Loleta Books and Dr. Silas Flood and they are ready to receive the patient and are aware of the right lower extremity swelling.

## 2021-04-06 NOTE — Assessment & Plan Note (Signed)
-   Consult Oncology 

## 2021-04-06 NOTE — Assessment & Plan Note (Signed)
At baseline, aptietn is independent, has no cognitive impairment.  Here, she is confused this morning, oriented to place, but responses slow and inapparopriate.  Due to Sepsis.

## 2021-04-06 NOTE — Assessment & Plan Note (Addendum)
Hgb stable relative to recent baseline.  No clinical bleeding.

## 2021-04-06 NOTE — Progress Notes (Signed)
ANTICOAGULATION CONSULT NOTE - Initial Consult  Pharmacy Consult for heparin Indication: DVT  No Known Allergies  Patient Measurements: Height: 5\' 10"  (177.8 cm) Weight: 78.7 kg (173 lb 8 oz) IBW/kg (Calculated) : 68.5 Heparin Dosing Weight: 78.7 kg  Vital Signs: Temp: 97.7 F (36.5 C) (01/24 1431) Temp Source: Axillary (01/24 1431) BP: 105/88 (01/24 1400) Pulse Rate: 119 (01/24 1431)  Labs: Recent Labs    03/28/2021 0024 04/11/2021 0322  HGB 10.7* 10.0*  HCT 33.2* 32.0*  PLT 110* 75*  APTT 29  --   LABPROT 16.8* 19.3*  INR 1.4* 1.6*  CREATININE 1.26* 1.80*    Estimated Creatinine Clearance: 38.2 mL/min (A) (by C-G formula based on SCr of 1.8 mg/dL (H)).   Medical History: Past Medical History:  Diagnosis Date   Allergy    Arthritis 2018   hands, left knee   Breast cancer ([place]) 03-27-2020   right breast IMC   Eczema    Family history of breast cancer    Family history of lung cancer    Family history of prostate cancer    Hypertension     Medications:  Scheduled:   Barium Sulfate  450 mL Oral BID   Chlorhexidine Gluconate Cloth  6 each Topical Daily   cholecalciferol  5,000 Units Oral Daily   sodium chloride flush  10-40 mL Intracatheter Q12H   vitamin B-12  5,000 mcg Oral Daily    Assessment: 56 yo female with notable PMH including breast cancer with bone metastases on chemo and biliary drain placement presenting with generalized weakness, fatigue, nausea, unable to move around.  Noted to have R leg swelling on 1/24 and dopplers performed.  No on AC currently due to low platelets.   - 1/24 Dopplers: findings consistent in both R and L legs for DVT of common femoral vein, SF junction, femoral vein, proximal profunda vein, popliteal vein, posterior tibial veins, and peroneal veins.  Baseline labs: aPTT 29, INR 1.4, Hgb 10.7  Today, 03/26/2021 - Platelets 110 > 75 likely due to sepsis presentation and chemotherapy > no bleeding reported - Hemoglobin stable  10's  Goal of Therapy:  Heparin level 0.3-0.7 units/ml Monitor platelets by anticoagulation protocol: Yes   Plan:  Small heparin bolus of 2000 units x1 given extensive DVT's + low platelets Begin heparin drip at 1200 units/hr Check heparin level in 6 hours Follow-up daily CBC and heparin level F/u ability to transition to PO anticoagulant  Dimple Nanas, PharmD 03/29/2021 2:42 PM

## 2021-04-06 NOTE — Progress Notes (Signed)
°   04/13/2021 1954 03/25/2021 0037  Assess: MEWS Score  Temp (!) 97.5 F (36.4 C) 97.6 F (36.4 C)  BP 107/82 93/75  Pulse Rate (!) 126 (!) 130  ECG Heart Rate (!) 126 (!) 130  Resp (!) 26 (!) 22  Level of Consciousness Alert Alert  SpO2  --  100 %  O2 Device  --  Room Air  Assess: MEWS Score  MEWS Temp 0 0  MEWS Systolic 0 1  MEWS Pulse 2 3  MEWS RR 2 1  MEWS LOC 0 0  MEWS Score 4 5  MEWS Score Color Red Red  Notify: Provider  Provider Name/Title  --  J. Olena Heckle  Date Provider Notified  --  03/28/2021  Time Provider Notified  --  0040  Notification Type  --  Page (secure chat)  Notification Reason  --  Other (Comment) (Pt HR 130)  Provider response  --  In department;See new orders  Date of Provider Response  --  03/23/2021  Time of Provider Response  --  0045  Assess: SIRS CRITERIA  SIRS Temperature  0 0  SIRS Pulse 1 1  SIRS Respirations  1 1  SIRS WBC 0 0  SIRS Score Sum  2 2   Pt HR remains elevated, Notified NP Olena Heckle. See new orders.

## 2021-04-06 NOTE — Discharge Instructions (Signed)
Be sure to follow-up with Dr. Junious Silk at [facility] urology specialist, [phone], for stent removal or exchange.

## 2021-04-07 ENCOUNTER — Inpatient Hospital Stay (HOSPITAL_COMMUNITY): Payer: BC Managed Care – PPO

## 2021-04-07 ENCOUNTER — Encounter (HOSPITAL_COMMUNITY): Payer: Self-pay | Admitting: Urology

## 2021-04-07 DIAGNOSIS — Z66 Do not resuscitate: Secondary | ICD-10-CM

## 2021-04-07 DIAGNOSIS — R6521 Severe sepsis with septic shock: Secondary | ICD-10-CM | POA: Diagnosis not present

## 2021-04-07 DIAGNOSIS — I2699 Other pulmonary embolism without acute cor pulmonale: Secondary | ICD-10-CM

## 2021-04-07 DIAGNOSIS — J9600 Acute respiratory failure, unspecified whether with hypoxia or hypercapnia: Secondary | ICD-10-CM

## 2021-04-07 DIAGNOSIS — R578 Other shock: Secondary | ICD-10-CM

## 2021-04-07 DIAGNOSIS — A419 Sepsis, unspecified organism: Secondary | ICD-10-CM | POA: Diagnosis not present

## 2021-04-07 LAB — LACTIC ACID, PLASMA: Lactic Acid, Venous: >9 mmol/L (ref 0.5–1.9)

## 2021-04-07 LAB — BASIC METABOLIC PANEL
Anion gap: 15 (ref 5–15)
BUN: 77 mg/dL — ABNORMAL HIGH (ref 6–20)
CO2: 13 mmol/L — ABNORMAL LOW (ref 22–32)
Calcium: 7.5 mg/dL — ABNORMAL LOW (ref 8.9–10.3)
Chloride: 104 mmol/L (ref 98–111)
Creatinine, Ser: 2.6 mg/dL — ABNORMAL HIGH (ref 0.44–1.00)
GFR, Estimated: 21 mL/min — ABNORMAL LOW (ref >60)
Glucose, Bld: 155 mg/dL — ABNORMAL HIGH (ref 70–99)
Potassium: 5.9 mmol/L — ABNORMAL HIGH (ref 3.5–5.1)
Sodium: 132 mmol/L — ABNORMAL LOW (ref 135–145)

## 2021-04-07 LAB — PROTIME-INR
INR: 2.3 — ABNORMAL HIGH (ref 0.8–1.2)
Prothrombin Time: 24.9 seconds — ABNORMAL HIGH (ref 11.4–15.2)

## 2021-04-07 LAB — BLOOD GAS, ARTERIAL
Acid-base deficit: 23.1 mmol/L — ABNORMAL HIGH (ref 0.0–2.0)
Bicarbonate: 7.4 mmol/L — ABNORMAL LOW (ref 20.0–28.0)
O2 Saturation: 68.9 %
Patient temperature: 98.6
pCO2 arterial: 36.2 mmHg (ref 32.0–48.0)
pH, Arterial: 6.944 — CL (ref 7.350–7.450)
pO2, Arterial: 58.1 mmHg — ABNORMAL LOW (ref 83.0–108.0)

## 2021-04-07 LAB — CBC
HCT: 30.1 % — ABNORMAL LOW (ref 36.0–46.0)
Hemoglobin: 9.3 g/dL — ABNORMAL LOW (ref 12.0–15.0)
MCH: 29.7 pg (ref 26.0–34.0)
MCHC: 30.9 g/dL (ref 30.0–36.0)
MCV: 96.2 fL (ref 80.0–100.0)
Platelets: 97 10*3/uL — ABNORMAL LOW (ref 150–400)
RBC: 3.13 MIL/uL — ABNORMAL LOW (ref 3.87–5.11)
RDW: 14.5 % (ref 11.5–15.5)
WBC: 14.5 10*3/uL — ABNORMAL HIGH (ref 4.0–10.5)
nRBC: 4.3 % — ABNORMAL HIGH (ref 0.0–0.2)

## 2021-04-07 LAB — COOXEMETRY PANEL
Carboxyhemoglobin: 0.9 % (ref 0.5–1.5)
Methemoglobin: 1.4 % (ref 0.0–1.5)
O2 Saturation: 47 %
Total hemoglobin: 7 g/dL — ABNORMAL LOW (ref 12.0–16.0)

## 2021-04-07 LAB — HEPARIN LEVEL (UNFRACTIONATED): Heparin Unfractionated: 0.91 IU/mL — ABNORMAL HIGH (ref 0.30–0.70)

## 2021-04-07 LAB — PATHOLOGIST SMEAR REVIEW

## 2021-04-07 LAB — APTT: aPTT: >200 seconds (ref 24–36)

## 2021-04-07 MED ORDER — EPINEPHRINE HCL 5 MG/250ML IV SOLN IN NS
0.5000 ug/min | INTRAVENOUS | Status: DC
  Filled 2021-04-07: qty 250

## 2021-04-07 MED ORDER — HEPARIN (PORCINE) 25000 UT/250ML-% IV SOLN
900.0000 [IU]/h | INTRAVENOUS | Status: DC
  Administered 2021-04-07: 10:00:00 900 [IU]/h via INTRAVENOUS
  Filled 2021-04-07: qty 250

## 2021-04-07 MED ORDER — SODIUM CHLORIDE 0.9 % IV SOLN: INTRAVENOUS | Status: DC | PRN

## 2021-04-07 MED ORDER — EPINEPHRINE HCL 5 MG/250ML IV SOLN IN NS
INTRAVENOUS | Status: AC
  Administered 2021-04-07: 5 mg
  Filled 2021-04-07: qty 250

## 2021-04-07 MED ORDER — EPINEPHRINE 1 MG/10ML IJ SOSY: 0.2000 mg | PREFILLED_SYRINGE | Freq: Once | INTRAMUSCULAR | Status: AC

## 2021-04-07 MED ORDER — NOREPINEPHRINE 4 MG/250ML-% IV SOLN
0.0000 ug/min | INTRAVENOUS | Status: DC
  Administered 2021-04-07: 09:00:00 40 ug/min via INTRAVENOUS
  Filled 2021-04-07: qty 250

## 2021-04-07 MED ORDER — SODIUM BICARBONATE 8.4 % IV SOLN
INTRAVENOUS | Status: AC
  Filled 2021-04-07: qty 50

## 2021-04-07 MED ORDER — EPINEPHRINE 1 MG/10ML IJ SOSY
PREFILLED_SYRINGE | INTRAMUSCULAR | Status: AC
  Administered 2021-04-07: 08:00:00 0.5 mg via INTRAVENOUS
  Filled 2021-04-07: qty 10

## 2021-04-07 MED ORDER — SODIUM BICARBONATE 8.4 % IV SOLN
INTRAVENOUS | Status: DC
  Filled 2021-04-07: qty 1000
  Filled 2021-04-07: qty 150

## 2021-04-07 MED ORDER — PHENYLEPHRINE CONCENTRATED 100MG/250ML (0.4 MG/ML) INFUSION SIMPLE
0.0000 ug/min | INTRAVENOUS | Status: DC
  Administered 2021-04-07: 400 ug/min via INTRAVENOUS
  Filled 2021-04-07 (×2): qty 250

## 2021-04-07 MED ORDER — VASOPRESSIN 20 UNITS/100 ML INFUSION FOR SHOCK
0.0300 [IU]/min | INTRAVENOUS | Status: DC
  Administered 2021-04-07: 08:00:00 0.03 [IU]/min via INTRAVENOUS
  Filled 2021-04-07 (×2): qty 100

## 2021-04-07 MED ORDER — SODIUM BICARBONATE 4.2 % IV SOLN
50.0000 meq | Freq: Once | INTRAVENOUS | Status: AC
  Administered 2021-04-07: 08:00:00 50 meq via INTRAVENOUS

## 2021-04-07 MED ORDER — NOREPINEPHRINE 16 MG/250ML-% IV SOLN
0.0000 ug/min | INTRAVENOUS | Status: DC
  Administered 2021-04-07: 11:00:00 40 ug/min via INTRAVENOUS
  Filled 2021-04-07: qty 250

## 2021-04-07 MED ORDER — NOREPINEPHRINE 4 MG/250ML-% IV SOLN
INTRAVENOUS | Status: AC
  Administered 2021-04-07: 08:00:00 20 ug/min via INTRAVENOUS
  Filled 2021-04-07: qty 250

## 2021-04-08 ENCOUNTER — Other Ambulatory Visit: Payer: BC Managed Care – PPO

## 2021-04-10 LAB — CULTURE, BLOOD (ROUTINE X 2)
Culture: NO GROWTH
Culture: NO GROWTH
Special Requests: ADEQUATE
Special Requests: ADEQUATE

## 2021-04-14 NOTE — Procedures (Signed)
Central Venous Catheter Insertion Procedure Note  NATALY PACIFICO  [id number]  01/05/1966  Date:Apr 16, 2021  Time:11:40 AM   Provider Performing:Pete [name] Abraham Kary Kos   Procedure: Insertion of Non-tunneled Central Venous Catheter(36556) with US guidance (76734)   Indication(s) Medication administration and Difficult access  Consent Unable to obtain consent due to emergent nature of procedure.  Anesthesia Topical only with 1% lidocaine   Timeout Verified patient identification, verified procedure, site/side was marked, verified correct patient position, special equipment/implants available, medications/allergies/relevant history reviewed, required imaging and test results available.  Sterile Technique Maximal sterile technique including full sterile barrier drape, hand hygiene, sterile gown, sterile gloves, mask, hair covering, sterile ultrasound probe cover (if used).  Procedure Description Area of catheter insertion was cleaned with chlorhexidine and draped in sterile fashion.  With real-time ultrasound guidance a central venous catheter was placed into the right internal jugular vein. Nonpulsatile blood flow and easy flushing noted in all ports.  The catheter was sutured in place and sterile dressing applied.  Complications/Tolerance None; patient tolerated the procedure well. Chest X-ray is ordered to verify placement for internal jugular or subclavian cannulation.   Chest x-ray is not ordered for femoral cannulation.  EBL Minimal  Specimen(s) None  Erick Colace ACNP-BC [facility] Pager # [phone] OR # [phone] if no answer

## 2021-04-14 NOTE — Procedures (Signed)
Arterial Catheter Insertion Procedure Note  MARIESHA VENTURELLA  [id number]  November 09, 1965  Date:04/22/21  Time:11:41 AM    Provider Performing: Clementeen Graham    Procedure: Insertion of Arterial Line [phone]) with US guidance (02725)   Indication(s) Blood pressure monitoring and/or need for frequent ABGs  Consent Unable to obtain consent due to emergent nature of procedure.  Anesthesia None   Time Out Verified patient identification, verified procedure, site/side was marked, verified correct patient position, special equipment/implants available, medications/allergies/relevant history reviewed, required imaging and test results available.   Sterile Technique Maximal sterile technique including full sterile barrier drape, hand hygiene, sterile gown, sterile gloves, mask, hair covering, sterile ultrasound probe cover (if used).   Procedure Description Area of catheter insertion was cleaned with chlorhexidine and draped in sterile fashion. With real-time ultrasound guidance an arterial catheter was placed into the left femoral artery.  Appropriate arterial tracings confirmed on monitor.     Complications/Tolerance None; patient tolerated the procedure well.   EBL Minimal   Specimen(s) None  Erick Colace ACNP-BC [place] Pager # [phone] OR # [phone] if no answer

## 2021-04-14 NOTE — Progress Notes (Signed)
°   23-Apr-2021 1450  Attending [place]  Attending Physician Notified Y  Attending Physician (First and Last Name) Marni Griffon, NP  Post Mortem Checklist  Date of Death 23-Apr-2021  Time of Death 06-13-43  Pronounced By Ria Bush RN & Cecille Amsterdam RN  Next of kin notified Yes  Name of next of kin notified of death Melody Haver  Contact Person's Relationship to Patient Parent  Contact Person's Phone Number [phone]  Was the patient a No Code Blue or a Limited Code Blue? Yes  Did the patient die unattended? No  Patient restrained? Not applicable  [facility] (previously known as [facility])  Notification Date 04/23/2021  Notification Time [place]  [facility] Number [phone]  Is patient a potential donor? Y  Donation Type Eyes  Autopsy  Autopsy requested by N/A  Patient and Axis Returned  Patient is satisfied that all belongings have been returned? Yes  Medical Examiner  Is this a medical examiner's case? [place] home name/address/phone # [place]  Planned location of pickup [place]

## 2021-04-14 NOTE — Progress Notes (Signed)
1 Day Post-Op   Subjective: [name] has taken a turn for the worse this AM with increased pressure support.   Objective: Vital signs in last 24 hours: Temp:  [96.9 F (36.1 C)-98.7 F (37.1 C)] 98.7 F (37.1 C) (01/25 0352) Pulse Rate:  [32-131] 113 (01/25 0130) Resp:  [15-39] 31 (01/25 0215) BP: (74-144)/(42-97) 99/55 (01/25 0215) SpO2:  [89 %-100 %] 100 % (01/25 0130) Weight:  [78.7 kg-86.5 kg] 86.5 kg (01/24 1431)  Intake/Output from previous day: 01/24 0701 - 01/25 0700 In: 3909 [I.V.:1862.1; IV Piggyback:2046.9] Out: 225 [Drains:225] Intake/Output this shift: No intake/output data recorded.   Physical Exam:  In distress and actively being evaluated and cared for by nurse and PCCM staff Foley in place with scant UOP  Lab Results: Recent Labs    03/19/2021 0024 03/14/2021 0322 04-16-2021 0441  HGB 10.7* 10.0* 9.3*  HCT 33.2* 32.0* 30.1*   BMET Recent Labs    03/20/2021 0322 04/16/21 0441  NA 130* 132*  K 5.2* 5.9*  CL 99 104  CO2 17* 13*  GLUCOSE 158* 155*  BUN 78* 77*  CREATININE 1.80* 2.60*  CALCIUM 8.2* 7.5*   Recent Labs    03/28/2021 0024 03/27/2021 0322 04-16-21 0441  INR 1.4* 1.6* 2.3*   No results for input(s): LABURIN in the last 72 hours. Results for orders placed or performed during the hospital encounter of 04/08/2021  Resp Panel by RT-PCR (Flu A&B, Covid) Nasopharyngeal Swab     Status: None   Collection Time: 04/04/2021 12:24 AM   Specimen: Nasopharyngeal Swab; Nasopharyngeal(NP) swabs in vial transport medium  Result Value Ref Range Status   SARS Coronavirus 2 by RT PCR NEGATIVE NEGATIVE Final    Comment: (NOTE) SARS-CoV-2 target nucleic acids are NOT DETECTED.  The SARS-CoV-2 RNA is generally detectable in upper respiratory specimens during the acute phase of infection. The lowest concentration of SARS-CoV-2 viral copies this assay can detect is 138 copies/mL. A negative result does not preclude SARS-Cov-2 infection and should not be used as  the sole basis for treatment or other patient management decisions. A negative result may occur with  improper specimen collection/handling, submission of specimen other than nasopharyngeal swab, presence of viral mutation(s) within the areas targeted by this assay, and inadequate number of viral copies(<138 copies/mL). A negative result must be combined with clinical observations, patient history, and epidemiological information. The expected result is Negative.  Fact Sheet for Patients:  EntrepreneurPulse.com.au  Fact Sheet for Healthcare Providers:  IncredibleEmployment.be  This test is no t yet approved or cleared by the [place] FDA and  has been authorized for detection and/or diagnosis of SARS-CoV-2 by FDA under an Emergency Use Authorization (EUA). This EUA will remain  in effect (meaning this test can be used) for the duration of the COVID-19 declaration under Section 564(b)(1) of the Act, 21 U.S.C.section 360bbb-3(b)(1), unless the authorization is terminated  or revoked sooner.       Influenza A by PCR NEGATIVE NEGATIVE Final   Influenza B by PCR NEGATIVE NEGATIVE Final    Comment: (NOTE) The Xpert Xpress SARS-CoV-2/FLU/RSV plus assay is intended as an aid in the diagnosis of influenza from Nasopharyngeal swab specimens and should not be used as a sole basis for treatment. Nasal washings and aspirates are unacceptable for Xpert Xpress SARS-CoV-2/FLU/RSV testing.  Fact Sheet for Patients: EntrepreneurPulse.com.au  Fact Sheet for Healthcare Providers: IncredibleEmployment.be  This test is not yet approved or cleared by the [place] and has been authorized  for detection and/or diagnosis of SARS-CoV-2 by FDA under an Emergency Use Authorization (EUA). This EUA will remain in effect (meaning this test can be used) for the duration of the COVID-19 declaration under Section 564(b)(1) of  the Act, 21 U.S.C. section 360bbb-3(b)(1), unless the authorization is terminated or revoked.  Performed at [facility], [place] [address] [place], [place] [address]   Blood Culture (routine x 2)     Status: None (Preliminary result)   Collection Time: 04/01/2021 12:24 AM   Specimen: BLOOD  Result Value Ref Range Status   Specimen Description   Final    BLOOD LEFT WRIST Performed at [facility] [address] [place], [place] [address]    Special Requests   Final    BOTTLES DRAWN AEROBIC AND ANAEROBIC Blood Culture adequate volume Performed at [facility] [address] [place], [place] [address]    Culture   Final    NO GROWTH 2 DAYS Performed at [facility] [address] [place], [place] [address]    Report Status PENDING  Incomplete  Urine Culture     Status: None   Collection Time: 03/30/2021 12:24 AM   Specimen: In/Out Cath Urine  Result Value Ref Range Status   Specimen Description   Final    IN/OUT CATH URINE Performed at [facility] [address] [place], [place] [address]    Special Requests   Final    NONE Performed at [facility], [place] [address] [place], [place] [address]    Culture   Final    NO GROWTH Performed at [facility], [place] [address] [place], [place] [address]    Report Status 03/22/2021 FINAL  Final  Blood Culture (routine x 2)     Status: None (Preliminary result)   Collection Time: 03/25/2021 12:29 AM   Specimen: BLOOD  Result Value Ref Range Status   Specimen Description   Final    BLOOD LEFT WRIST Performed at [facility] [address] [place], [place] [address]    Special Requests   Final    BOTTLES DRAWN AEROBIC AND ANAEROBIC Blood Culture adequate volume Performed at [facility] [address] [place],  [address]    Culture   Final    NO GROWTH 2 DAYS Performed at  [facility] [address] [place],  [address]    Report Status PENDING  Incomplete  Respiratory (~20 pathogens) panel by PCR     Status: None   Collection Time: 04/12/2021  7:42 AM   Specimen: Nasopharyngeal Swab; Respiratory  Result Value Ref Range Status   Adenovirus NOT DETECTED NOT DETECTED Final   Coronavirus 229E NOT DETECTED NOT DETECTED Final    Comment: (NOTE) The Coronavirus on the Respiratory Panel, DOES NOT test for the novel  Coronavirus (2019 nCoV)    Coronavirus HKU1 NOT DETECTED NOT DETECTED Final   Coronavirus NL63 NOT DETECTED NOT DETECTED Final   Coronavirus OC43 NOT DETECTED NOT DETECTED Final   Metapneumovirus NOT DETECTED NOT DETECTED Final   Rhinovirus / Enterovirus NOT DETECTED NOT DETECTED Final   Influenza A NOT DETECTED NOT DETECTED Final   Influenza B NOT DETECTED NOT DETECTED Final   Parainfluenza Virus 1 NOT DETECTED NOT DETECTED Final   Parainfluenza Virus 2 NOT DETECTED NOT DETECTED Final   Parainfluenza Virus 3 NOT DETECTED NOT DETECTED Final   Parainfluenza Virus 4  NOT DETECTED NOT DETECTED Final   Respiratory Syncytial Virus NOT DETECTED NOT DETECTED Final   Bordetella pertussis NOT DETECTED NOT DETECTED Final   Bordetella Parapertussis NOT DETECTED NOT DETECTED Final   Chlamydophila pneumoniae NOT DETECTED NOT DETECTED Final   Mycoplasma pneumoniae NOT DETECTED NOT DETECTED Final    Comment: Performed at [facility], [place] [address] [place], [place] [address]  MRSA Next Gen by PCR, Nasal     Status: None   Collection Time: 04/01/2021  2:52 PM   Specimen: Nasal Mucosa; Nasal Swab  Result Value Ref Range Status   MRSA by PCR Next Gen NOT DETECTED NOT DETECTED Final    Comment: (NOTE) The GeneXpert MRSA Assay (FDA approved for NASAL specimens only), is one component of a comprehensive MRSA colonization surveillance program. It is not intended to diagnose MRSA infection nor to guide or monitor treatment for MRSA  infections. Test performance is not FDA approved in patients less than 9 years old. Performed at [facility], [place] [address] [place], [place] [address]     Studies/Results: Echo:  1. Left ventricular ejection fraction, by estimation, is 35 to 40%. The  left ventricle has moderately decreased function. The left ventricle  demonstrates global hypokinesis. Left ventricular diastolic function could  not be evaluated.   2. Right ventricular systolic function is normal. The right ventricular  size is normal.  LE [place]: Summary:  BILATERAL:  -No evidence of popliteal cyst, bilaterally.  RIGHT:  - Findings consistent with acute deep vein thrombosis involving the right  common femoral vein, SF junction, right femoral vein, right proximal  profunda vein, right popliteal vein, right posterior tibial veins, and  right peroneal veins.  - Unable to evaluate extension of common femoral vein obstruction proximal  to the inguinal ligament.     LEFT:  - Findings consistent with acute deep vein thrombosis involving the left  common femoral vein, left femoral vein, SF junction, left proximal  profunda vein, left popliteal vein, left posterior tibial veins, and left  peroneal veins.  - Unable to evaluate extension of common femoral vein obstruction proximal  to the inguinal ligament.   Assessment/Plan: -bilateral hydronephrosis - s/p bilateral ureteral stent and foley to max drain system. AKI multifactorial. -shock - PCCM team actively evaluating   Will follow. Discussed with patient's mom and son in hallway.     LOS: 2 days   Festus Aloe 04/10/2021, 8:10 AM

## 2021-04-14 NOTE — TOC Progression Note (Signed)
Transition of Care [facility]) - Progression Note    Patient Details  Name: [name] MRN: [id number] Date of Birth: [birth year]  [facility] of [facility] CM/SW Contact  Purcell Mouton, RN Phone Number: 28-Apr-2021, 3:05 PM  Clinical Narrative:    Pt expired.    Expected Discharge Plan: Expired    Expected Discharge Plan and Services Expected Discharge Plan: Expired                                               Social Determinants of Health (SDOH) Interventions    Readmission Risk Interventions No flowsheet data found.

## 2021-04-14 NOTE — Progress Notes (Signed)
[facility] Progress Note Patient Name: [name] DOB: 25-Mar-1965 MRN: [id number]   Date of Service  05/03/2021  HPI/Events of Note  PT >200  eICU Interventions  Pharm team notified. Await heparin level        Amilyah Nack Rodman Pickle 05/03/2021, 7:04 AM

## 2021-04-14 NOTE — Death Summary Note (Signed)
DEATH SUMMARY   Patient Details  Name: [name] MRN: [id number] DOB: 1965/07/25  Admission/Discharge Information   Admit Date:  04/14/21  Date of Death: Date of Death: 2021-04-16  Time of Death: Time of Death: 04/23/1443  Length of Stay: 2  Referring Physician: [name], MD   Reason(s) for Hospitalization  Severe sepsis with encephalopathy and AKI, bilateral hydronephrosis from malignant extrinsic compression  Diagnoses  Preliminary cause of death:  Secondary Diagnoses (including complications and co-morbidities):  Principal Problem:   Septic shock ([place]) Active Problems:   Malignant neoplasm of upper-outer quadrant of right breast in female, estrogen receptor positive ([place])   Bone metastases ([place])   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 ([place])   Acute metabolic encephalopathy   Hyponatremia   AKI (acute kidney injury) ([place])   Hyperkalemia   Thrombocytopenia ([facility])   Anemia associated with chemotherapy   Right leg swelling   Bilateral hydronephrosis   Lactic acidosis   DVT, lower extremity, proximal, acute, bilateral ([place])   Obstructive cardiovascular shock ([place])   DNR (do not resuscitate)   Acute respiratory failure ([place])   Acute massive pulmonary embolism [facility])   Brief Hospital Course (including significant findings, care, treatment, and services provided and events leading to death)  [name] is a 56 y.o. year old female who has a history of metastatic breast cancer admitted with encephalopathy and sepsis subsequently transferred to the ICU after hypotension following bilateral ureteral stent placement due to extrinsic compression and bilateral hydronephrosis in the setting of malignant disease.  CT scan on admission demonstrated worsening metastatic disease with carcinomatosis and bulky known distant metastases from breast cancer.  She was placed on broad-spectrum antibiotics.  He became more tachycardic and borderline hypotensive overnight.  Fluids were given.   She was taken to the OR for bilateral stent placement with urology due to hydronephrosis as discussed above.  She remained on low-dose phenylephrine throughout the day in the intensive care unit.  She is known to have low swollen lower extremities.  Bilateral Dopplers revealed bilateral DVT.  TTE showed normal RV size and function.  The following morning, around 6:30 AM, rapid escalation to 4 pressors over the course of an hour or so.  Agonal breathing.  Severe lactic acidosis.  Cooximeter low.  Concern for obstructive shock in addition to known septic shock likely in the setting of DVT leading to new PE given normal TTE the day preceding.  Given her worsening metastatic disease and encephalopathy, concern for intracerebral mass but unstable for imaging.  Therefore, was deemed not a candidate for lytic therapy for presumed PE.  Unstable for additional imaging to confirm or deny the presence of PE.  She remained in refractory shock and developed refractory acidosis.  Family was informed.  They agreed to DNR.  She subsequently passed surrounded by loved ones in the afternoon 16-Apr-2022 on 4 vasopressors.    Pertinent Labs and Studies  Significant Diagnostic Studies CT ABDOMEN PELVIS WO CONTRAST  Result Date: 2021-04-14 CLINICAL DATA:  Acute abdominal pain, history of biliary obstruction with stenting, breast cancer with bone metastasis, chronic pain, hypertension, presents with generalized weakness and dry cough for 1 week, fatigue. Found with acute kidney injury, tachycardia, elevated lactic acid EXAM: CT ABDOMEN AND PELVIS WITHOUT CONTRAST TECHNIQUE: Multidetector CT imaging of the abdomen and pelvis was performed following the standard protocol without IV contrast. RADIATION DOSE REDUCTION: This exam was performed according to the departmental dose-optimization program which includes automated exposure control, adjustment  of the mA and/or kV according to patient size and/or use of iterative reconstruction  technique. COMPARISON:  02/06/2021; correlation PET-CT 03/11/2021 FINDINGS: Lower chest: Peribronchial thickening and interstitial prominence at lung bases. Minimal bibasilar atelectasis. Hepatobiliary: Post PTC with biliary drain extending from anterior abdominal wall through liver, CBD, and into third portion of duodenum. Mild intrahepatic biliary dilatation. Gallbladder filled by dense material, unchanged, question prior contrast. Remainder of liver unremarkable. Pancreas: Enlargement and surrounding infiltrative changes at pancreatic head again seen. Pancreatic body and tail otherwise normal appearance. Spleen: Normal appearance Adrenals/Urinary Tract: Adrenal glands normal appearance. Persistent RIGHT hydronephrosis and proximal hydroureter, distal ureter decompressed. Mild LEFT hydronephrosis without ureteral dilatation. Bladder wall appears thickened but bladder is underdistended question artifact. Stomach/Bowel: Stomach unremarkable. No bowel dilatation or wall thickening. Slightly prominent gas within sigmoid loop. Vascular/Lymphatic: Atherosclerotic calcifications aorta and iliac arteries without aneurysm. BILATERAL pelvic adenopathy. Reproductive: Uterus surgically absent with nonvisualization of ovaries Other: Increase in size of fluid collection at lesser sac now 1.0 x 2.5 cm. Scattered ascites perihepatic, perisplenic, pelvis. Extensive omental infiltration and peritoneal soft tissue nodularity consistent with peritoneal carcinomatosis. Increase in size of a perigastric mass 3.2 x 2.3 cm image 34. Musculoskeletal: Numerous osseous metastases, both lytic and sclerotic. IMPRESSION: Post PTC with biliary drain extending from anterior abdominal wall through liver, CBD, and into third portion of duodenum with mild intrahepatic biliary dilatation. Persistent RIGHT and mild LEFT hydronephrosis without ureteral dilatation, question due to UPJ obstruction on LEFT and mid ureteral obstruction on RIGHT Extensive  omental infiltration and peritoneal soft tissue nodularity consistent with peritoneal carcinomatosis, progressive since prior exam. Increase in size of a perigastric mass and BILATERAL pelvic adenopathy. Numerous osseous metastases. Increased loculated fluid collection at lesser sac. Aortic Atherosclerosis (ICD10-I70.0). Electronically Signed   By: Lavonia Dana M.D.   On: 03/30/2021 11:11   NM PET Image Initial (PI) Skull Base To Thigh  Result Date: 03/11/2021 CLINICAL DATA:  Subsequent treatment strategy for metastatic breast cancer. EXAM: NUCLEAR MEDICINE PET SKULL BASE TO THIGH TECHNIQUE: 9.18 mCi F-18 FDG was injected intravenously. Full-ring PET imaging was performed from the skull base to thigh after the radiotracer. CT data was obtained and used for attenuation correction and anatomic localization. Fasting blood glucose: 101 mg/dl COMPARISON:  CT scan 02/06/2021 FINDINGS: Mediastinal blood pool activity: SUV max 2.62 Liver activity: SUV max NA NECK: No hypermetabolic lymph nodes in the neck. Incidental CT findings: none CHEST: Surgical changes from right mastectomy and right lymph node dissection. No findings suspicious for recurrent chest wall tumor. No enlarged or hypermetabolic axillary or supraclavicular adenopathy. Extensive hypermetabolic mediastinal and hilar lymphadenopathy. Left hilar node is difficult to measure but the SUV max is 16.15. Right hilar node measures 15 mm and SUV max is 11.65. Right paratracheal node measures 9 mm and SUV max is 16.21. No measurable pulmonary nodules are identified. Lower lobe interstitial thickening and moderate hypermetabolism (4.77) could suggest an inflammatory process or interstitial spread of tumor. Incidental CT findings: Stable vascular calcifications. The left-sided Port-A-Cath is in good position. ABDOMEN/PELVIS: No findings for hepatic or adrenal gland metastasis. There is diffuse and marked hypermetabolism in the pancreatic head. SUV max in the  pancreatic head is 12.42. A 4.6 cm pseudocyst is noted in the lesser sac. Surrounding hypermetabolic lymphadenopathy. Hypermetabolic omental nodularity with a cystic area which could be a pseudocyst. Celiac axis node measuring 17 mm on image 113/4 has an SUV max of 17.46. Bilateral iliac hypermetabolic lymphadenopathy. Two left-sided adjacent 9 mm nodes  on image 147/4 has an SUV max of 11.16. Smaller right-sided common iliac nodes are also hypermetabolic. 13 mm left obturator node on image 166/4 has an SUV max of 11.16. Incidental CT findings: Stable scattered vascular calcifications. There is marked right-sided hydronephrosis with findings worrisome for tumoral involvement of the right proximal ureter. The bladder is grossly. Free pelvic fluid is noted. Biliary drainage catheter remains in place SKELETON: Diffuse mixed lytic and sclerotic hypermetabolic bone disease involving the cervical, thoracic and lumbar spines, bilateral scapulae, the sternum, ribs, extensive involvement of the bony pelvis and left lesser trochanter. Findings are progressive compared to the prior CT scan. No obvious pathologic fracture or spinal canal compromise. Incidental CT findings: none IMPRESSION: 1. Extensive and progressive osseous metastatic disease with mixed lytic and sclerotic hypermetabolic lesions throughout the axial and appendicular skeleton. 2. Extensive hypermetabolic mediastinal and hilar lymphadenopathy. 3. Lower lobe interstitial prominence and hypermetabolism could be due to an inflammatory/infectious process or interstitial spread of tumor. 4. Marked hypermetabolism in the pancreatic head could be chronic inflammation or neoplasm. 5. Extensive hypermetabolic abdominal and pelvic lymphadenopathy, peritoneal implants and omental metastatic disease. 6. 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 Electronically Signed   By: Marijo Sanes M.D.   On: 03/11/2021 17:15   DG  CHEST PORT 1 VIEW  Result Date: 04/13/2021 CLINICAL DATA:  Central line placement EXAM: PORTABLE CHEST 1 VIEW COMPARISON:  Chest radiograph 03/15/2021 FINDINGS: There is a new right IJ vascular catheter with the tip terminating in the mid to lower SVC. The left chest wall port is stable terminating at the cavoatrial junction. The cardiomediastinal silhouette is stable. Lung volumes remain low. There is no new or worsening focal airspace disease. There is no overt pulmonary edema. There is no pleural effusion or pneumothorax. The bones are stable. IMPRESSION: 1. New right IJ vascular catheter terminating in the mid to lower SVC. 2. Unchanged aeration of the lungs. Electronically Signed   By: Valetta Mole M.D.   On: 04/13/21 08:43   DG Chest Port 1 View  Result Date: 04/12/2021 CLINICAL DATA:  Sepsis EXAM: PORTABLE CHEST 1 VIEW COMPARISON:  CT 12/29/2020 FINDINGS: Mild elevation of the right hemidiaphragm is unchanged. Lungs are clear. No pneumothorax or pleural effusion. Cardiac size within normal limits. Left internal jugular chest port tip seen within the superior cavoatrial junction. Sclerotic metastasis with associated pathologic fracture noted within the right fourth and sixth ribs. Healed left fourth rib fracture. No acute bone abnormality. IMPRESSION: No active disease. Electronically Signed   By: Fidela Salisbury M.D.   On: 03/16/2021 02:04   DG C-Arm 1-60 Min-No Report  Result Date: 03/18/2021 Fluoroscopy was utilized by the requesting physician.  No radiographic interpretation.   ECHOCARDIOGRAM COMPLETE  Result Date: 04/12/2021    ECHOCARDIOGRAM REPORT   Patient Name:   [name] Date of Exam: 03/31/2021 Medical Rec #:  [id number]      Height:       70.0 in Accession #:    [id number]     Weight:       173.5 lb Date of Birth:  13-Jun-1965     BSA:          1.965 m Patient Age:    52 years       BP:           87/54 mmHg Patient Gender: F              HR:  115 bpm. Exam Location:   Inpatient Procedure: 2D Echo, Cardiac Doppler and Color Doppler STAT ECHO Indications:    Shock  History:        Patient has prior history of Echocardiogram examinations, most                 recent 04/15/2020. Risk Factors:Hypertension.  Sonographer:    Glo Herring Referring Phys: [place]  1. Left ventricular ejection fraction, by estimation, is 35 to 40%. The left ventricle has moderately decreased function. The left ventricle demonstrates global hypokinesis. Left ventricular diastolic function could not be evaluated.  2. Right ventricular systolic function is normal. The right ventricular size is normal.  3. The mitral valve is normal in structure. No evidence of mitral valve regurgitation. No evidence of mitral stenosis.  4. The aortic valve was not well visualized. Aortic valve regurgitation is not visualized. No aortic stenosis is present. Comparison(s): A prior study was performed on 04/15/20. LVEF has worsened. TR has improved. FINDINGS  Left Ventricle: Left ventricular ejection fraction, by estimation, is 35 to 40%. The left ventricle has moderately decreased function. The left ventricle demonstrates global hypokinesis. The left ventricular internal cavity size was small. There is no left ventricular hypertrophy. Left ventricular diastolic function could not be evaluated. Right Ventricle: The right ventricular size is normal. No increase in right ventricular wall thickness. Right ventricular systolic function is normal. Left Atrium: Left atrial size was normal in size. Right Atrium: Right atrial size was normal in size. Pericardium: Trivial pericardial effusion is present. Mitral Valve: The mitral valve is normal in structure. No evidence of mitral valve regurgitation. No evidence of mitral valve stenosis. Tricuspid Valve: The tricuspid valve is normal in structure. Tricuspid valve regurgitation is mild . No evidence of tricuspid stenosis. Aortic Valve: The aortic valve was  not well visualized. Aortic valve regurgitation is not visualized. No aortic stenosis is present. Aortic valve mean gradient measures 1.0 mmHg. Aortic valve peak gradient measures 2.4 mmHg. Aortic valve area, by VTI measures 2.04 cm. Pulmonic Valve: The pulmonic valve was not well visualized. Pulmonic valve regurgitation is not visualized. Aorta: The aortic root is normal in size and structure. IAS/Shunts: No atrial level shunt detected by color flow Doppler.  LEFT VENTRICLE PLAX 2D LVOT diam:     1.80 cm LV SV:         25 LV SV Index:   13 LVOT Area:     2.54 cm  LV Volumes (MOD) LV vol d, MOD A2C: 43.6 ml LV vol d, MOD A4C: 49.4 ml LV vol s, MOD A2C: 28.5 ml LV vol s, MOD A4C: 30.4 ml LV SV MOD A2C:     15.1 ml LV SV MOD A4C:     49.4 ml LV SV MOD BP:      16.1 ml RIGHT VENTRICLE            IVC RV Basal diam:  3.30 cm    IVC diam: 1.60 cm RV Mid diam:    2.60 cm RV S prime:     8.49 cm/s LEFT ATRIUM           Index        RIGHT ATRIUM           Index LA Vol (A2C): 23.6 ml 12.01 ml/m  RA Area:     10.20 cm  RA Volume:   20.10 ml  10.23 ml/m  AORTIC VALVE AV Area (Vmax):    2.29 cm AV Area (Vmean):   2.15 cm AV Area (VTI):     2.04 cm AV Vmax:           77.60 cm/s AV Vmean:          55.200 cm/s AV VTI:            0.121 m AV Peak Grad:      2.4 mmHg AV Mean Grad:      1.0 mmHg LVOT Vmax:         69.80 cm/s LVOT Vmean:        46.700 cm/s LVOT VTI:          0.097 m LVOT/AV VTI ratio: 0.80  AORTA Ao Root diam: 2.60 cm TRICUSPID VALVE TR Peak grad:   35.8 mmHg TR Vmax:        299.00 cm/s  SHUNTS Systemic VTI:  0.10 m Systemic Diam: 1.80 cm Rudean Haskell MD Electronically signed by Rudean Haskell MD Signature Date/Time: 04/10/2021/4:06:07 PM    Final    VAS [place] LOWER EXTREMITY VENOUS (DVT)  Result Date: 04/13/2021  Lower Venous DVT Study Patient Name:  [name]  Date of Exam:   03/15/2021 Medical Rec #: [id number]       Accession #:    [id number] Date of Birth:  04-17-1965      Patient Gender: F Patient Age:   4 years Exam Location:  [facility] Procedure:      VAS [place] LOWER EXTREMITY VENOUS (DVT) Referring Phys: Myrene Buddy --------------------------------------------------------------------------------  Indications: Swelling.  Limitations: Body habitus and poor ultrasound/tissue interface. Comparison Study: No previous exams Performing Technologist: Jody Hill RVT, RDMS  Examination Guidelines: A complete evaluation includes B-mode imaging, spectral Doppler, color Doppler, and power Doppler as needed of all accessible portions of each vessel. Bilateral testing is considered an integral part of a complete examination. Limited examinations for reoccurring indications may be performed as noted. The reflux portion of the exam is performed with the patient in reverse Trendelenburg.  +---------+---------------+---------+-----------+----------+--------------+  RIGHT     Compressibility Phasicity Spontaneity Properties Thrombus Aging  +---------+---------------+---------+-----------+----------+--------------+  CFV       None            No        No                     Acute           +---------+---------------+---------+-----------+----------+--------------+  SFJ       None                                             Acute           +---------+---------------+---------+-----------+----------+--------------+  FV Prox   None            No        No                     Acute           +---------+---------------+---------+-----------+----------+--------------+  FV Mid    None            No        No  Acute           +---------+---------------+---------+-----------+----------+--------------+  FV Distal None            No        No                     Acute           +---------+---------------+---------+-----------+----------+--------------+  PFV       None            No        No                     Acute            +---------+---------------+---------+-----------+----------+--------------+  POP       None            No        No                     Acute           +---------+---------------+---------+-----------+----------+--------------+  PTV       None            No        No                     Acute           +---------+---------------+---------+-----------+----------+--------------+  PERO      None            No        No                     Acute           +---------+---------------+---------+-----------+----------+--------------+   +---------+---------------+---------+-----------+----------+--------------+  LEFT      Compressibility Phasicity Spontaneity Properties Thrombus Aging  +---------+---------------+---------+-----------+----------+--------------+  CFV       Partial         No        Yes                    Acute           +---------+---------------+---------+-----------+----------+--------------+  SFJ       None                                             Acute           +---------+---------------+---------+-----------+----------+--------------+  FV Prox   None            No        No                     Acute           +---------+---------------+---------+-----------+----------+--------------+  FV Mid    None            No        No                     Acute           +---------+---------------+---------+-----------+----------+--------------+  FV Distal None            No        No  Acute           +---------+---------------+---------+-----------+----------+--------------+  PFV       Partial         Yes       Yes                    Acute           +---------+---------------+---------+-----------+----------+--------------+  POP       None            No        No                     Acute           +---------+---------------+---------+-----------+----------+--------------+  PTV       None            No        No                     Acute            +---------+---------------+---------+-----------+----------+--------------+  PERO      None            No        No                     Acute           +---------+---------------+---------+-----------+----------+--------------+     Summary: BILATERAL: -No evidence of popliteal cyst, bilaterally. RIGHT: - Findings consistent with acute deep vein thrombosis involving the right common femoral vein, SF junction, right femoral vein, right proximal profunda vein, right popliteal vein, right posterior tibial veins, and right peroneal veins. - Unable to evaluate extension of common femoral vein obstruction proximal to the inguinal ligament.  LEFT: - Findings consistent with acute deep vein thrombosis involving the left common femoral vein, left femoral vein, SF junction, left proximal profunda vein, left popliteal vein, left posterior tibial veins, and left peroneal veins. - Unable to evaluate extension of common femoral vein obstruction proximal to the inguinal ligament.  *See table(s) above for measurements and observations. Electronically signed by Deitra Mayo MD on 04/13/2021 at 9:07:53 PM.    Final     Microbiology Recent Results (from the past 240 hour(s))  Resp Panel by RT-PCR (Flu A&B, Covid) Nasopharyngeal Swab     Status: None   Collection Time: 04/10/2021 12:24 AM   Specimen: Nasopharyngeal Swab; Nasopharyngeal(NP) swabs in vial transport medium  Result Value Ref Range Status   SARS Coronavirus 2 by RT PCR NEGATIVE NEGATIVE Final    Comment: (NOTE) SARS-CoV-2 target nucleic acids are NOT DETECTED.  The SARS-CoV-2 RNA is generally detectable in upper respiratory specimens during the acute phase of infection. The lowest concentration of SARS-CoV-2 viral copies this assay can detect is 138 copies/mL. A negative result does not preclude SARS-Cov-2 infection and should not be used as the sole basis for treatment or other patient management decisions. A negative result may occur with  improper  specimen collection/handling, submission of specimen other than nasopharyngeal swab, presence of viral mutation(s) within the areas targeted by this assay, and inadequate number of viral copies(<138 copies/mL). A negative result must be combined with clinical observations, patient history, and epidemiological information. The expected result is Negative.  Fact Sheet for Patients:  EntrepreneurPulse.com.au  Fact Sheet for Healthcare Providers:  IncredibleEmployment.be  This test is no t yet approved or cleared by the [place]  States FDA and  has been authorized for detection and/or diagnosis of SARS-CoV-2 by FDA under an Emergency Use Authorization (EUA). This EUA will remain  in effect (meaning this test can be used) for the duration of the COVID-19 declaration under Section 564(b)(1) of the Act, 21 U.S.C.section 360bbb-3(b)(1), unless the authorization is terminated  or revoked sooner.       Influenza A by PCR NEGATIVE NEGATIVE Final   Influenza B by PCR NEGATIVE NEGATIVE Final    Comment: (NOTE) The Xpert Xpress SARS-CoV-2/FLU/RSV plus assay is intended as an aid in the diagnosis of influenza from Nasopharyngeal swab specimens and should not be used as a sole basis for treatment. Nasal washings and aspirates are unacceptable for Xpert Xpress SARS-CoV-2/FLU/RSV testing.  Fact Sheet for Patients: EntrepreneurPulse.com.au  Fact Sheet for Healthcare Providers: IncredibleEmployment.be  This test is not yet approved or cleared by the [place] FDA and has been authorized for detection and/or diagnosis of SARS-CoV-2 by FDA under an Emergency Use Authorization (EUA). This EUA will remain in effect (meaning this test can be used) for the duration of the COVID-19 declaration under Section 564(b)(1) of the Act, 21 U.S.C. section 360bbb-3(b)(1), unless the authorization is terminated or revoked.  Performed at  [facility], [place] [address] [place], [place] [address]   Blood Culture (routine x 2)     Status: None (Preliminary result)   Collection Time: 03/23/2021 12:24 AM   Specimen: BLOOD  Result Value Ref Range Status   Specimen Description   Final    BLOOD LEFT WRIST Performed at [facility] [address] [place], [place] [address]    Special Requests   Final    BOTTLES DRAWN AEROBIC AND ANAEROBIC Blood Culture adequate volume Performed at [facility] [address], [place], [place] [address]    Culture   Final    NO GROWTH 3 DAYS Performed at [facility], [place] [address], [place], [place] [address]    Report Status PENDING  Incomplete  Urine Culture     Status: None   Collection Time: 04/04/2021 12:24 AM   Specimen: In/Out Cath Urine  Result Value Ref Range Status   Specimen Description   Final    IN/OUT CATH URINE Performed at [facility] [address] [place] [place], [place] [address]    Special Requests   Final    NONE Performed at [facility], [place] [address] [place], [place] [address]    Culture   Final    NO GROWTH Performed at [facility], [place] [address], [place], [place] [address]    Report Status 03/29/2021 FINAL  Final  Blood Culture (routine x 2)     Status: None (Preliminary result)   Collection Time: 03/20/2021 12:29 AM   Specimen: BLOOD  Result Value Ref Range Status   Specimen Description   Final    BLOOD LEFT WRIST Performed at [place] [address], [place], [place] [address]    Special Requests   Final    BOTTLES DRAWN AEROBIC AND ANAEROBIC Blood Culture adequate volume Performed at [place] [address] [place], [place] [address]    Culture   Final    NO GROWTH 3 DAYS Performed at [facility], [place] [address], [place],  [address]    Report Status PENDING  Incomplete   Respiratory (~20 pathogens) panel by PCR  Status: None   Collection Time: 03/19/2021  7:42 AM   Specimen: Nasopharyngeal Swab; Respiratory  Result Value Ref Range Status   Adenovirus NOT DETECTED NOT DETECTED Final   Coronavirus 229E NOT DETECTED NOT DETECTED Final    Comment: (NOTE) The Coronavirus on the Respiratory Panel, DOES NOT test for the novel  Coronavirus (2019 nCoV)    Coronavirus HKU1 NOT DETECTED NOT DETECTED Final   Coronavirus NL63 NOT DETECTED NOT DETECTED Final   Coronavirus OC43 NOT DETECTED NOT DETECTED Final   Metapneumovirus NOT DETECTED NOT DETECTED Final   Rhinovirus / Enterovirus NOT DETECTED NOT DETECTED Final   Influenza A NOT DETECTED NOT DETECTED Final   Influenza B NOT DETECTED NOT DETECTED Final   Parainfluenza Virus 1 NOT DETECTED NOT DETECTED Final   Parainfluenza Virus 2 NOT DETECTED NOT DETECTED Final   Parainfluenza Virus 3 NOT DETECTED NOT DETECTED Final   Parainfluenza Virus 4 NOT DETECTED NOT DETECTED Final   Respiratory Syncytial Virus NOT DETECTED NOT DETECTED Final   Bordetella pertussis NOT DETECTED NOT DETECTED Final   Bordetella Parapertussis NOT DETECTED NOT DETECTED Final   Chlamydophila pneumoniae NOT DETECTED NOT DETECTED Final   Mycoplasma pneumoniae NOT DETECTED NOT DETECTED Final    Comment: Performed at [facility], [place]. [address], [place], [place] [address]  MRSA Next Gen by PCR, Nasal     Status: None   Collection Time: 03/25/2021  2:52 PM   Specimen: Nasal Mucosa; Nasal Swab  Result Value Ref Range Status   MRSA by PCR Next Gen NOT DETECTED NOT DETECTED Final    Comment: (NOTE) The GeneXpert MRSA Assay (FDA approved for NASAL specimens only), is one component of a comprehensive MRSA colonization surveillance program. It is not intended to diagnose MRSA infection nor to guide or monitor treatment for MRSA infections. Test performance is not FDA approved in patients less than 73 years old. Performed at [facility], [place] [address] [place], [place] [address]     Lab Basic Metabolic Panel: Recent Labs  Lab 03/16/2021 0024 04/03/2021 0322 May 06, 2021 0441  NA 130* 130* 132*  K 4.1 5.2* 5.9*  CL 95* 99 104  CO2 20* 17* 13*  GLUCOSE 162* 158* 155*  BUN 64* 78* 77*  CREATININE 1.26* 1.80* 2.60*  CALCIUM 9.5 8.2* 7.5*   Liver Function Tests: Recent Labs  Lab 03/29/2021 0024 03/29/2021 0322  AST 54* 65*  ALT 25 28  ALKPHOS 259* 242*  BILITOT 1.0 1.2  PROT 7.5 6.9  ALBUMIN 2.9* 2.6*   Recent Labs  Lab 03/22/2021 0120  LIPASE 75*   No results for input(s): AMMONIA in the last 168 hours. CBC: Recent Labs  Lab 04/01/2021 0024 03/14/2021 0322 May 06, 2021 0441  WBC 11.8* 13.3* 14.5*  NEUTROABS 10.1*  --   --   HGB 10.7* 10.0* 9.3*  HCT 33.2* 32.0* 30.1*  MCV 92.0 93.6 96.2  PLT 110* 75* 97*   Cardiac Enzymes: No results for input(s): CKTOTAL, CKMB, CKMBINDEX, TROPONINI in the last 168 hours. Sepsis Labs: Recent Labs  Lab 03/22/2021 0024 03/21/2021 0036 04/02/2021 0447 03/25/2021 2003 03/16/2021 0322 04/09/2021 1532 May 06, 2021 0441 05-06-2021 1039  PROCALCITON  --   --   --   --  1.59  --   --   --   WBC 11.8*  --   --   --  13.3*  --  14.5*  --   LATICACIDVEN  --    < > 2.3* 2.0*  --  1.9  --  >9.0*   < > = values in this interval not displayed.    Procedures/Operations  As per EMR   Bonna Gains [place] 04/08/2021, 10:49 AM

## 2021-04-14 NOTE — Progress Notes (Signed)
ANTICOAGULATION CONSULT NOTE - follow up  Pharmacy Consult for heparin Indication: DVT  No Known Allergies  Patient Measurements: Height: 5\' 10"  (177.8 cm) Weight: 86.5 kg (190 lb 11.2 oz) IBW/kg (Calculated) : 68.5 Heparin Dosing Weight: 78.7 kg  Vital Signs: Temp: 98.7 F (37.1 C) (01/25 0352) Temp Source: Axillary (01/25 0352) BP: 99/55 (01/25 0215) Pulse Rate: 113 (01/25 0130)  Labs: Recent Labs    04/12/2021 0024 04/09/2021 0322 03/25/2021 1532 03/14/2021 2213 04-16-2021 0441 04/16/2021 0600  HGB 10.7* 10.0*  --   --  9.3*  --   HCT 33.2* 32.0*  --   --  30.1*  --   PLT 110* 75*  --   --  97*  --   APTT 29  --   --   --  >200*  --   LABPROT 16.8* 19.3*  --   --  24.9*  --   INR 1.4* 1.6*  --   --  2.3*  --   HEPARINUNFRC  --   --   --  1.05*  --  0.91*  CREATININE 1.26* 1.80*  --   --  2.60*  --   TROPONINIHS  --   --  71*  --   --   --      Estimated Creatinine Clearance: 29.2 mL/min (A) (by C-G formula based on SCr of 2.6 mg/dL (H)).   Medical History: Past Medical History:  Diagnosis Date   Allergy    Arthritis 2018   hands, left knee   Breast cancer ([place]) 03-27-2020   right breast IMC   Eczema    Family history of breast cancer    Family history of lung cancer    Family history of prostate cancer    Hypertension     Medications:  Scheduled:   Barium Sulfate  450 mL Oral BID   Chlorhexidine Gluconate Cloth  6 each Topical Daily   cholecalciferol  5,000 Units Oral Daily   mouth rinse  15 mL Mouth Rinse BID   sodium chloride flush  10-40 mL Intracatheter Q12H   vitamin B-12  5,000 mcg Oral Daily    Assessment: 56 yo female with notable PMH including breast cancer with bone metastases on chemo and biliary drain placement presenting with generalized weakness, fatigue, nausea, unable to move around.  Noted to have R leg swelling on 1/24 and dopplers performed.  No on AC currently due to low platelets.   - 1/24 Dopplers: findings consistent in both R and L  legs for DVT of common femoral vein, SF junction, femoral vein, proximal profunda vein, popliteal vein, posterior tibial veins, and peroneal veins.  Baseline labs: aPTT 29, INR 1.4, Hgb 10.7  Today, 2021/04/16 - Platelets 75 > 97 trended up likely due to sepsis presentation and chemotherapy > no bleeding reported, no line issues per RN  - Hemoglobin low but stable at  9.3 - SCr trending up, now 2.60 mg/dl CrCl 29 ml/min  - HL 0.91 supra-therapeutic on 1000 units/hr - per RN level drawn from port, heparin running peripherally.  No bleeding noted  Goal of Therapy:  Heparin level 0.3-0.7 units/ml Monitor platelets by anticoagulation protocol: Yes   Plan:  Decrease heparin drip to 900 units/hr Check heparin level in 8 hours Follow-up daily CBC and heparin level F/u ability to transition to PO anticoagulant   Royetta Asal, PharmD, BCPS 04/16/21 7:43 AM

## 2021-04-14 NOTE — Progress Notes (Addendum)
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  In ER was tachycardic, had leukocytosis and AKI. CT abd/pelvis showed PTC biliary drain through liver, CBD and into third portion of duodenum w/ mild intrahepatic biliary dilatation, persistent Right and mild left hydronephrosis. W/ possible UPJ obstruction on the left and mild ureteral obstruction on right. Also had extensive omental infiltrate of peritoneum. Was admitted w/ working dx of sepsis felt either abd or urinary tract source. Cultures sent. ABX started.   PCCM asked to eval on 1/24 for refractory hypotension (see hospital course outlined below)   Pertinent  Medical History  Breast cancer, BrCA triple negative w/ mets to chest and retroperitoneal LNs, bones, and biliary obstruction w/ chronic drain placement.  Xeloda started as had disease progression on adjuvant immunotherapy and lact of targetable mutations.  HTN Significant Hospital Events: Including procedures, antibiotic start and stop dates in addition to other pertinent events   1/23 admitted w/ working dx of sepsis felt either abd or urinary tract source. Cultures sent. ABX started cefepime and flagyl. Urology consulted for bilateral hydro w/ AKI felt left was new and right was more chronic and recommended watching initially.  1/24 remained tachycardic. SBP as low as 80s. Remained Hypotensive in spite of Volume resuscitation w/ crystalloids. Abx changed to meropenem and zyvox. Decision made to bring to OR for bilateral ureteral stent placement. PCCM asked to assist w/ care for septic shock. Acute bilateral LE DVTs via [place]. IV heparin started. ECHO: 35-40% global hypokinesis, RV size nml. Random cortisol  72 1/25 Acutely decompensated at ~ 0700. Marked refractory shock requiring rapidly escalating vasoactive support (neo, then norepinephrine, vasopressin and epinephrine infusion added). Felt most likely obstructive shock from progression of DVT to acute Pulmonary emboli. Not candidate for Lytics given metastatic disease. Family urgently called to bedside. Pt had near arrest requiring pushes of epinephrine and bicarb. Goals of care discussed. Advised family that DNR status w/ DNI most appropriate would she to fail the invasive current measures. They accepted this. DNR/DNI status made.   Interim History / Subjective:  Appears terminally ill.   Objective   Blood pressure (Abnormal) 159/124, pulse (Abnormal) 146, temperature 98.7 F (37.1 C), temperature source Axillary, resp. rate (Abnormal) 26, height _0  (1.778 m), weight 86.5 kg, SpO2 (Abnormal) 86 %.        Intake/Output Summary (Last 24 hours) at 05-07-2021 0920 Last data filed at 05-07-21 0700 Gross per 24 hour  Intake 3908.99 ml  Output 225 ml  Net 3683.99 ml   Filed Weights   03/14/2021 0125 03/30/2021 0905 04/04/2021 1431  Weight: 78.7 kg 78.7 kg 86.5 kg    Examination: General on arrival pt awake, in acute distress. Respirations agonal at times and rhonchus. She is currently terminally ill.  HENT NCAT, she does have some JVD. Sclera not icteric  Pulm rhonchus w. Increased respiratory effort. Unable to read pulse ox. Now on NRB Card tachy irreg. Cool and mottled Abd soft  Ext cool mottled pitting edema Neuro fluctuates. At times awake, confused to nearly unresponsive. Currently interacting w/ family but speech incomprehensible  GU minumal UOP .   Resolved Hospital Problem list     Assessment & Plan:  Principal Problem:   Septic shock ([place]) Active Problems:   Bilateral hydronephrosis   AKI (acute kidney injury) ([place])   Obstructive cardiovascular shock (HCC)   Acute massive pulmonary embolism (HCC)   Acute metabolic  encephalopathy   DVT, lower extremity, proximal, acute, bilateral (HCC)   Acute respiratory failure (HCC)   Lactic acidosis   Hyponatremia   Hyperkalemia   Malignant neoplasm of upper-outer quadrant of right breast in female, estrogen receptor positive (HCC)   Bone metastases (HCC)   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 ([place])   Thrombocytopenia ([place])   Anemia associated with chemotherapy   Right leg swelling   DNR (do not resuscitate)  Multifactorial shock: initially Septic shock presume from Urinary tract source, and now acutely complicated by what is likely obstructive cardiogenic shock from Pulmonary emboli.  EF 35-40% Plan Transduce CVP Serial Co-ox IVFs to [facility] Cont Norepi/vasopressin and added epi gtt. Will work on titration down neo Ck ABG Day 3 abx, day 2 zyvox and meropenem  Cont to hold inderal and aldactone DNR should she arrest.   Acute hypoxic respiratory failure  2/2 Pulmonary Emboli and shock state and end-organ hypoperfusion Plan 100% NRB  Pulse ox No intubation  IV heparin   Acute Bilateral DVT w/ PE  Not candidate for lytics Plan IV heparin  Treat shock  AKI in setting of bilateral hydronephrosis (right chronic/left acute); further c/b sepsis and end-organ hypoperfusion  S/p Bilateral ureteral stent placement 1/24 w/ op report noting left UPJ 2/2 external compression  -scr rising prior to surgery, and continues to rise  -not HD candidate  Plan Cont to treat shock  Renal dose meds Strict I&O Additional recs per Urology   Lactic acidosis in setting of sepsis  Acidosis worse on chemistry  Plan Ck abg Repeat lactate May try further bicarb gtt to temporize (goal Ph > 7.2)  Acute metabolic encephalopathy 2/2 sepsis Plan Holding sedation  Supportive care   Fluid and electrolyte imbalance: hyperkalemia, hyponatremia,  Plan Got 2 amps bicarb.  Repeat serial chems  May need kayexalate   Anemia w/out evidence of bleeding Plan Trend  cbc Transfuse for hgb < 7   Thrombocytopenia 2/2 sepsis Plan Trend (fairly stable)   Chronic biliary obstruction w/ bilary stents  Plan Monitor drain output   Hyperglycemia Plan Ssi   Widely metastatic Breast cancer, BrCA triple negative w/ mets to chest and retroperitoneal LNs, bones, and biliary. Now probably causing hydronephrosis as well Plan Onc follow up if able   DNR status made today  Plan DNR/DNI if arrests   Best Practice (right click and "Reselect all SmartList Selections" daily)   Diet/type: NPO DVT prophylaxis: SCD GI prophylaxis: N/A Lines: Central line and yes and it is still needed Foley:  Yes, and it is still needed Code Status:  DNR Last date of multidisciplinary goals of care discussion [per primary ]    Critical care time: 60 minutes      Erick Colace ACNP-BC [place] Pager # [phone] OR # [phone] if no answer  Critical care attending attestation note:   Patient seen and examined and relevant ancillary tests reviewed.  I agree with the assessment and plan of care as outlined by Marni Griffon NP.    56 y.o. woman with metastatic breast cancer presents with concerns of sepsis with tachycardia, leukocytosis and CT scan demonstrates bilateral hydronephrosis as well as increased burden of metastatic disease in the abdomen transferred to the stepdown unit for hypotension.  Relatively stable overnight and about 6:30 AM.  Received call with worsening hypotension.  Maxed out on phenylephrine with unable to get BPs.  Liter bolus norepinephrine were added.  Central line placed.  Blood pressure seemed to improve transiently but then continued to decline.  Escalated to 4 pressors including epinephrine and phenylephrine.  Family was called in.  Due to metastatic disease and refractory shock, nothing further that can be added medically.  Recommended to be DNR/DNI which was accepted.  Co. oximetry low less than 50%, concerning for mixed  shock picture some concern for obstructive shock given she has bilateral DVTs and may have developed massive PE.  Unstable for scans.  EF also reduced although she feels warm, less concern for cardiogenic component.  Grave prognosis, suspect will die today.   Synopsis of assessment and plan:   Shock, presumed septic with severe sepsis elevated lactic acid and low-dose phenylephrine following procedure/sedation with endorgan damage being AKI and encephalopathy with source most likely urinary given bilateral hydronephrosis: Also appears volume down, endorses thirstiness. --NE, epi, vaso, Phenylephrine, MAP goal greater than 65, wean phenyl first --Continue broad-spectrum antibiotics --TTE 1/24 without signs of RV dysfunction reassurign against PE in that moment, did show reduced EF --Likely now mixed shock with sepsis and component of presumed obstructive shock due to PE given acute and profound decompensation 1/25 with Co-ox < 50%, unstable for scans, not candidate for lytics given metastatic disease, she is warm arguing against cardiogenic component but possible   Acute kidney injury due to postrenal obstruction with bilateral hydronephrosis as well as ATN from shock --Status post bilateral ureteral stents --Ongoing fluid resuscitation   Acute toxic metabolic encephalopathy due to sepsis --Avoid centrally acting meds   Normal cytopenia: Due to sepsis, trend   Anemia: Likely anemia due to chronic disease/inflammation, no source of bleeding, continue to monitor   Hyperglycemia: SSI   Widely metastatic breast cancer with imaging on admission showing worsening metastatic disease   Bilateral DVT: Risk factor being malignancy --Heparin drip   CRITICAL CARE Performed by: Bonna Gains Khaleelah Yowell     Total critical care time: 50 minutes   Critical care time was exclusive of separately billable procedures and treating other patients.   Critical care was necessary to treat or prevent imminent or  life-threatening deterioration.   Critical care was time spent personally by me on the following activities: development of treatment plan with patient and/or surrogate as well as nursing, discussions with consultants, evaluation of patient's response to treatment, examination of patient, obtaining history from patient or surrogate, ordering and performing treatments and interventions, ordering and review of laboratory studies, ordering and review of radiographic studies, pulse oximetry, re-evaluation of patient's condition and participation in multidisciplinary rounds.   Lanier Clam, MD See Shea Evans for contact info

## 2021-04-14 DEATH — deceased

## 2021-04-19 ENCOUNTER — Inpatient Hospital Stay: Payer: BC Managed Care – PPO

## 2021-04-19 ENCOUNTER — Inpatient Hospital Stay: Payer: BC Managed Care – PPO | Admitting: Hematology and Oncology

## 2021-05-11 ENCOUNTER — Other Ambulatory Visit (HOSPITAL_COMMUNITY): Payer: Self-pay

## 2021-05-18 ENCOUNTER — Other Ambulatory Visit: Payer: BC Managed Care – PPO

## 2021-05-18 ENCOUNTER — Ambulatory Visit: Payer: BC Managed Care – PPO | Admitting: Adult Health

## 2021-05-18 ENCOUNTER — Ambulatory Visit: Payer: BC Managed Care – PPO

## 2021-06-15 ENCOUNTER — Ambulatory Visit: Payer: BC Managed Care – PPO

## 2021-06-15 ENCOUNTER — Other Ambulatory Visit: Payer: BC Managed Care – PPO

## 2021-06-15 ENCOUNTER — Ambulatory Visit: Payer: BC Managed Care – PPO | Admitting: Hematology and Oncology

## 2021-07-13 ENCOUNTER — Other Ambulatory Visit: Payer: BC Managed Care – PPO

## 2021-07-13 ENCOUNTER — Ambulatory Visit: Payer: BC Managed Care – PPO | Admitting: Hematology and Oncology

## 2021-07-13 ENCOUNTER — Ambulatory Visit: Payer: BC Managed Care – PPO

## 2022-02-02 IMAGING — NM NM BONE WHOLE BODY
2 series · 2 of 2 positions shown · non-contrast
Comparison: CT 04/20/2020

CLINICAL DATA: Breast cancer, staging new breast cancer staging
prior to chemo/metastatic lymph node

EXAM:
NUCLEAR MEDICINE WHOLE BODY BONE SCAN
TECHNIQUE: Whole body anterior and posterior images were obtained approximately
3 hours after intravenous injection of radiopharmaceutical.
RADIOPHARMACEUTICALS:  20.9 mCi Vechnetium-DDm MDP IV

[Series 1: wbr_bone_40 whole body · 2.66mm/px · 1 of 1 slices shown (1 of 2)]
[im 1/1]
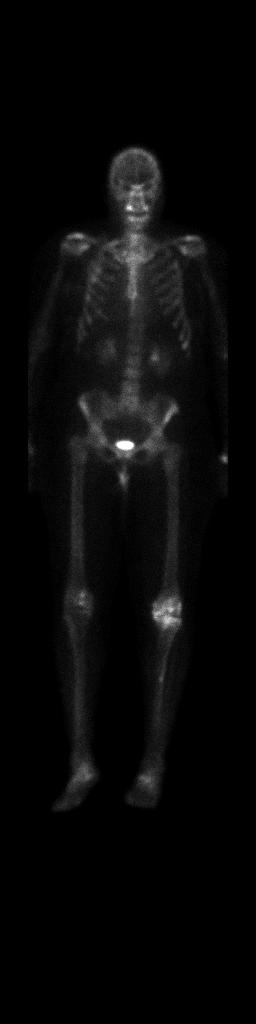

[Series 1: wbr_bone_40 whole body · 2.66mm/px · 1 of 1 slices shown (2 of 2)]
[im 1/1]
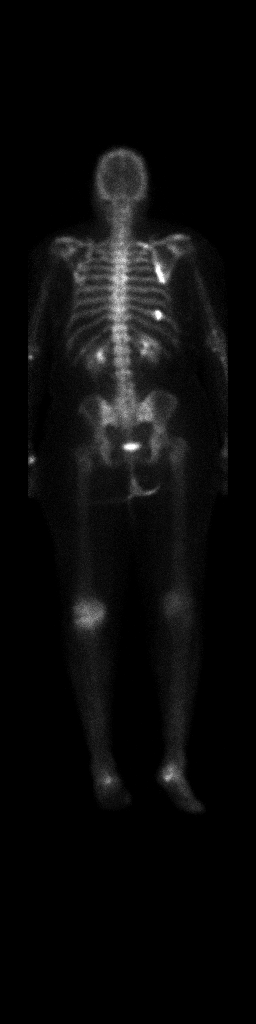

[2 of 2 positions shown; findings below may reference images not displayed]

FINDINGS: Along the inferior medial border of the RIGHT scapula there is a rim
of intense radiotracer activity which corresponds to a mixed lytic
lesion on CT.

Focal uptake posterior RIGHT ninth rib corresponds to pathologic
fracture on comparison CT

On comparison CT there several small sclerotic lesions in the spine
as well as a lytic lesion at T9. These are not well appreciated on
the bone scan.

There is intense uptake associated with the LEFT knee involving the
femoral condyles and tibial plateau.

Tiny focus of uptake in the midshaft LEFT tibia.
IMPRESSION: 1. Multifocal skeletal metastasis corresponds to findings on
comparison CT chest.
2. Uptake in the LEFT knee is favored severe degenerative
arthropathy.

## 2022-02-11 IMAGING — MR MR THORACIC SPINE WO/W CM
8 of 10 series · 33 of 48 positions shown · IV contrast (gadavist)
Comparison: Chest CT 04/20/2020, bone scan 04/20/2020

CLINICAL DATA: Metastatic breast cancer. Evaluate for spinal cord
compression.

EXAM:
MRI THORACIC WITHOUT AND WITH CONTRAST
TECHNIQUE: Multiplanar and multiecho pulse sequences of the thoracic spine were
obtained without and with intravenous contrast.
CONTRAST:  10mL GADAVIST GADOBUTROL 1 MMOL/ML IV SOLN

[Series 16: T1 · sagittal · 4.0mm · 1.72mm/px · 3 of 13 slices shown (1 of 3)]
[im 1/13]
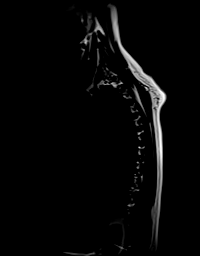
[im 7/13]
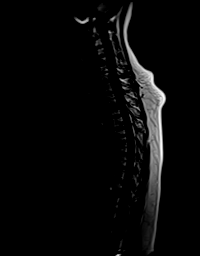
[im 13/13]
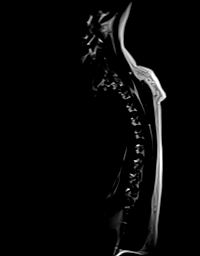

[Series 17: T2 · sagittal · 3.0mm · 0.81mm/px · 4 of 17 slices shown (1 of 2)]
[im 1/17]
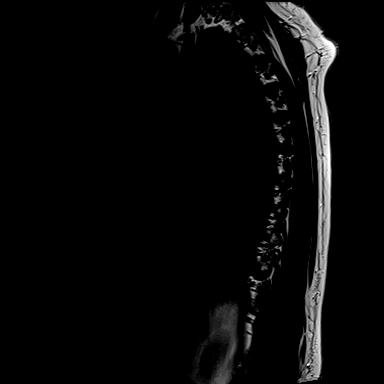
[im 6/17]
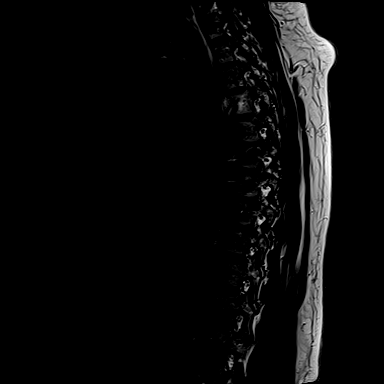
[im 11/17]
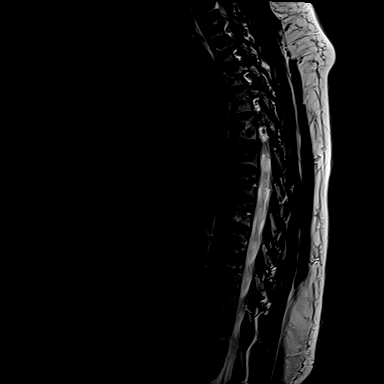
[im 17/17]
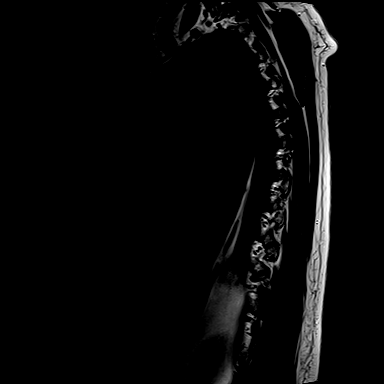

[Series 18: STIR · sagittal · 3.0mm · 0.97mm/px · 3 of 17 slices shown]
[im 1/17]
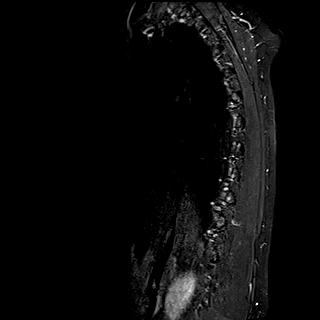
[im 9/17]
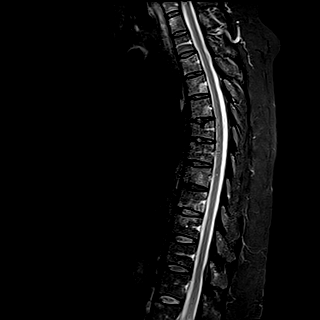
[im 17/17]
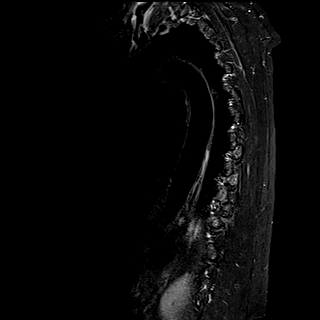

[Series 19: T1 · sagittal · 3.0mm · 0.97mm/px · 3 of 17 slices shown (2 of 3)]
[im 1/17]
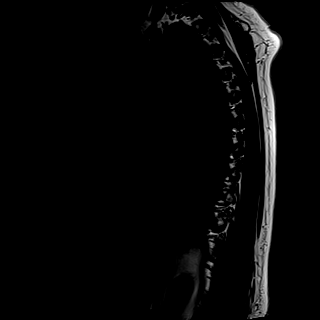
[im 9/17]
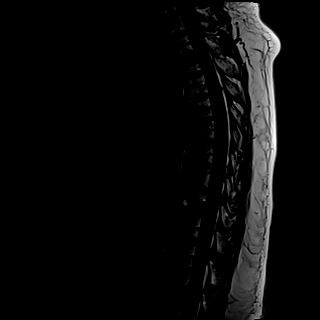
[im 17/17]
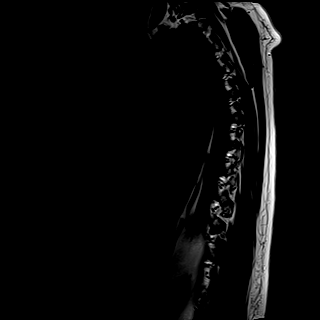

[Series 20: T2 · axial · 4.0mm · 0.78mm/px · z∈[-207,+32]mm · 7 of 39 slices shown (2 of 2)]
[im 1/39]
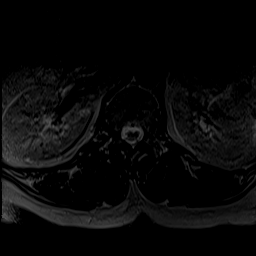
[im 7/39]
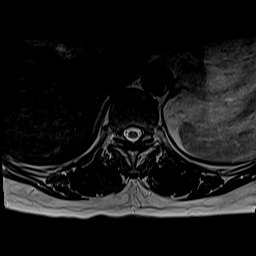
[im 13/39]
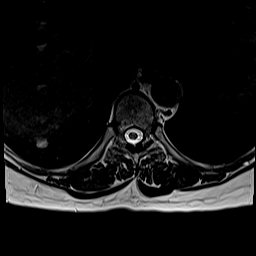
[im 20/39]
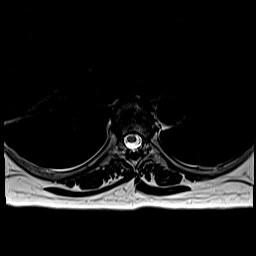
[im 26/39]
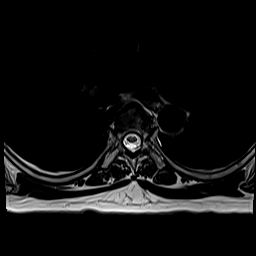
[im 32/39]
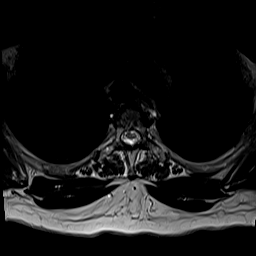
[im 39/39]
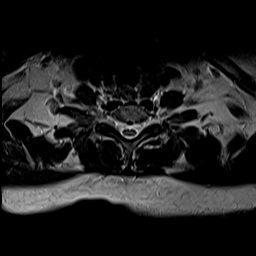

[Series 22: T1 · axial · 4.0mm · 0.39mm/px · z∈[-207,+32]mm · 7 of 39 slices shown (3 of 3)]
[im 1/39]
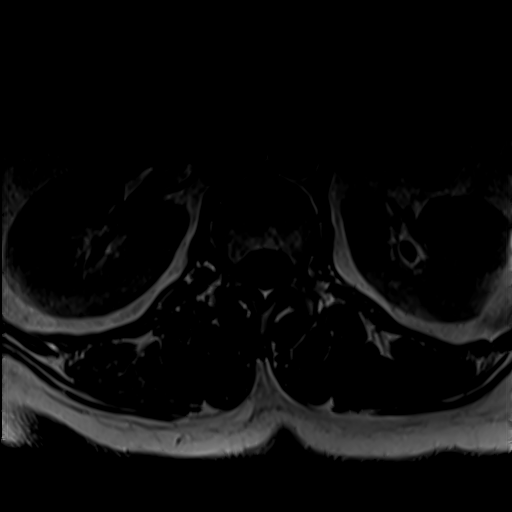
[im 7/39]
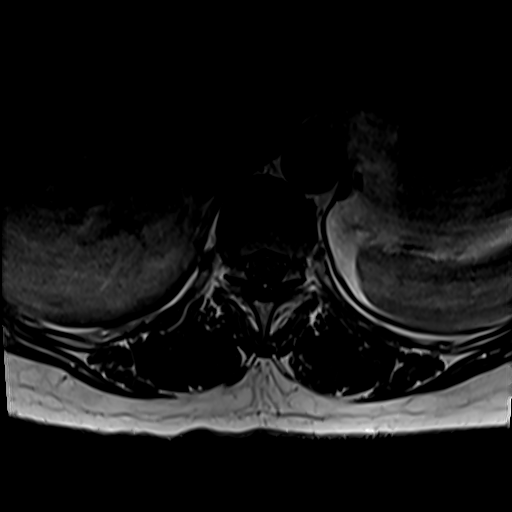
[im 13/39]
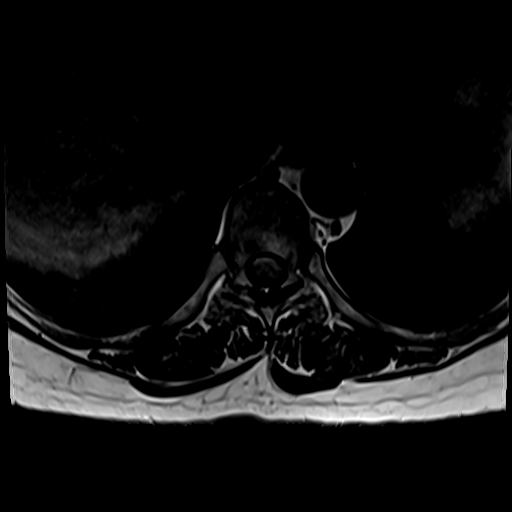
[im 20/39]
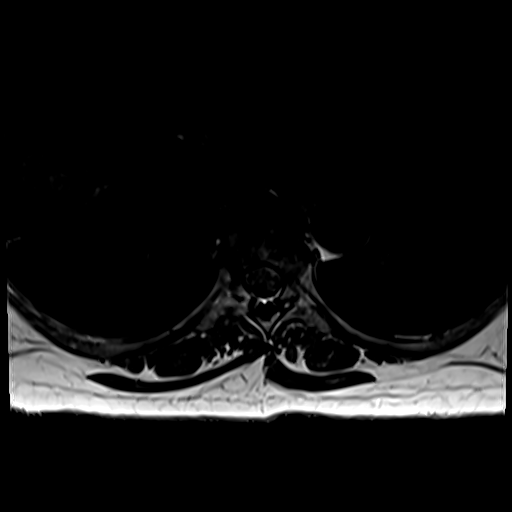
[im 26/39]
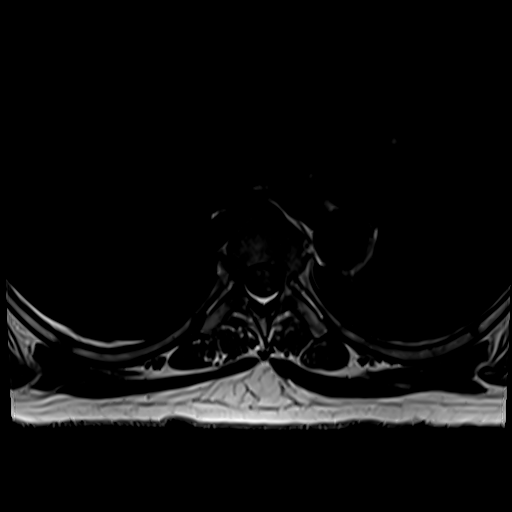
[im 32/39]
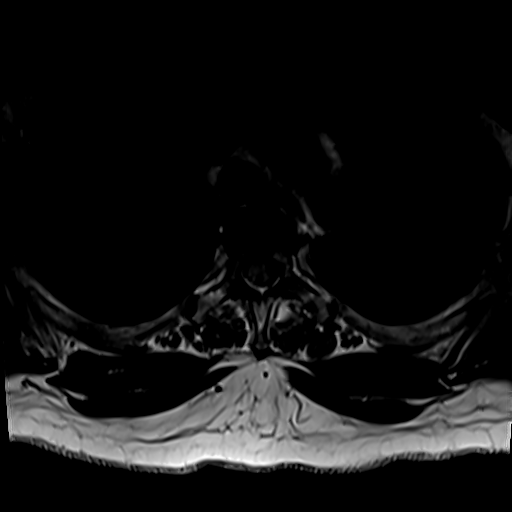
[im 39/39]
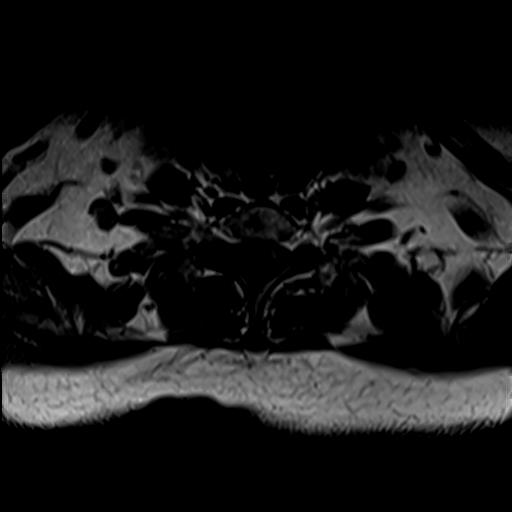

[Series 23: T1 fat-sat post-contrast · sagittal · 3.0mm · 0.97mm/px · 3 of 17 slices shown]
[im 1/17]
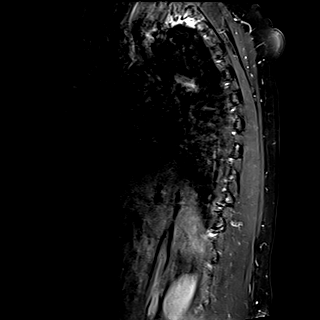
[im 9/17]
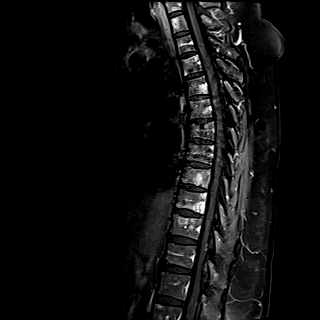
[im 17/17]
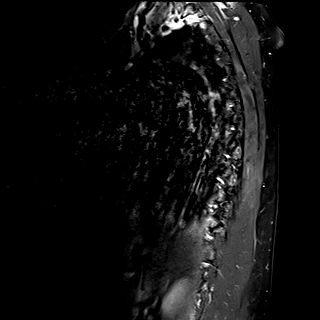

[Series 24: T1 post-contrast · axial · 4.0mm · 0.39mm/px · z∈[-207,-100]mm · 3 of 39 slices shown]
[im 1/39]
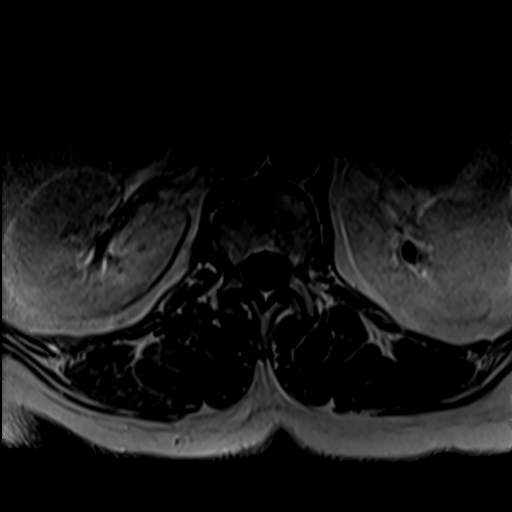
[im 7/39]
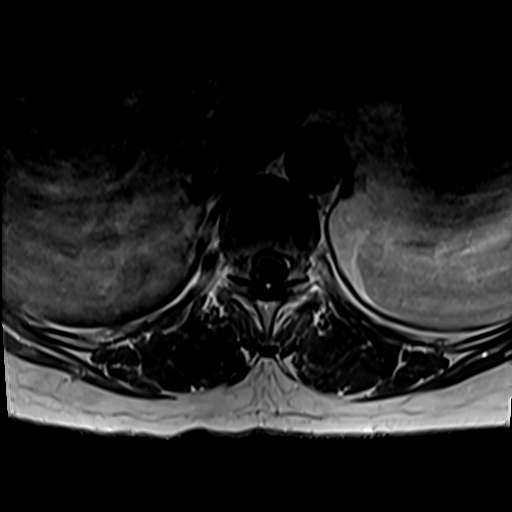
[im 13/39]
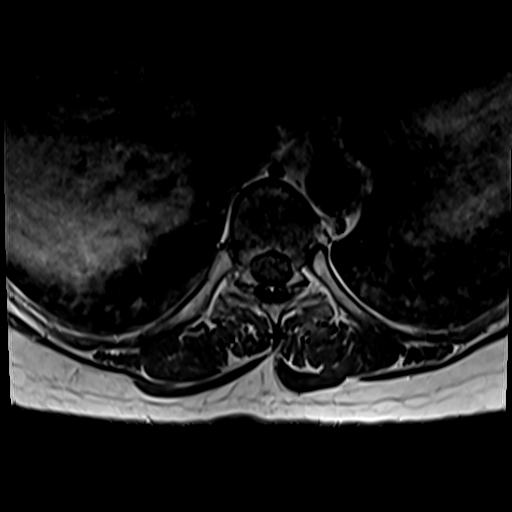

[33 of 48 positions shown; findings below may reference images not displayed]

FINDINGS: Alignment:  Physiologic.

Vertebrae: Chronic T6 vertebral body compression fracture with
approximately 20% anterior height loss. Remainder the vertebral body
heights are maintained. No acute fracture. No discitis or
osteomyelitis. Abnormal bone lesions throughout the thoracic spine
vertebral bodies and lower cervical spine vertebral bodies and
posterior elements consistent with metastatic disease. Abnormal bone
lesion involving the left T8 pedicle and right T6 pedicle. No
epidural soft tissue component. Abnormal bone lesions scattered
throughout the visualized portions of the ribs with a destructive
bone lesion with the large soft tissue component involving the right
posterior ninth rib.

Cord:  Normal signal and morphology.

Paraspinal and other soft tissues: No acute paraspinal abnormality.

Disc levels:

No significant thoracic spine disc protrusion, foraminal stenosis or
central canal stenosis.
IMPRESSION: 1. Diffuse osseous metastatic disease of the thoracic spine and
lower cervical spine. No epidural soft tissue component. No cord
compression.

## 2022-09-08 ENCOUNTER — Other Ambulatory Visit (HOSPITAL_COMMUNITY): Payer: Self-pay

## 2022-11-12 IMAGING — US US ABDOMEN LIMITED
1 series · 14 of 25 positions shown · non-contrast
Comparison: CT temporal bones, 12/29/2020.

CLINICAL DATA: Elevated bilirubin.

EXAM:
ULTRASOUND ABDOMEN LIMITED RIGHT UPPER QUADRANT

[Series 1: us abdomen limited ruq (liver/gb) · 46 acquisitions, 14 frames shown]
[im 1/46]
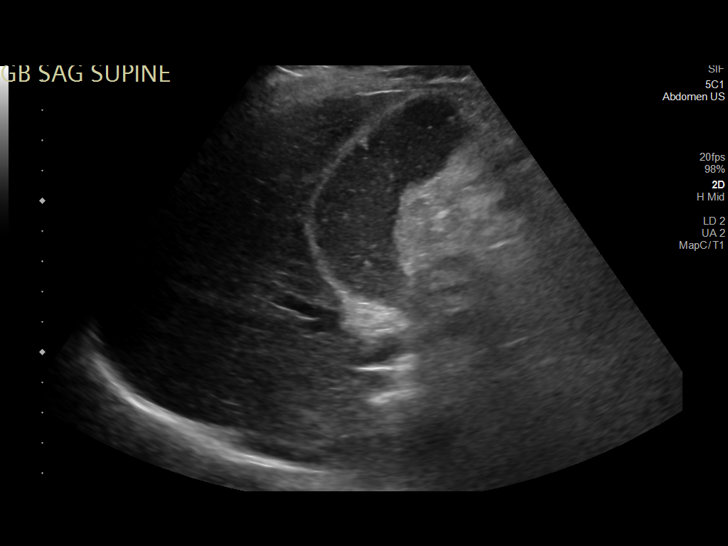
[im 4/46]
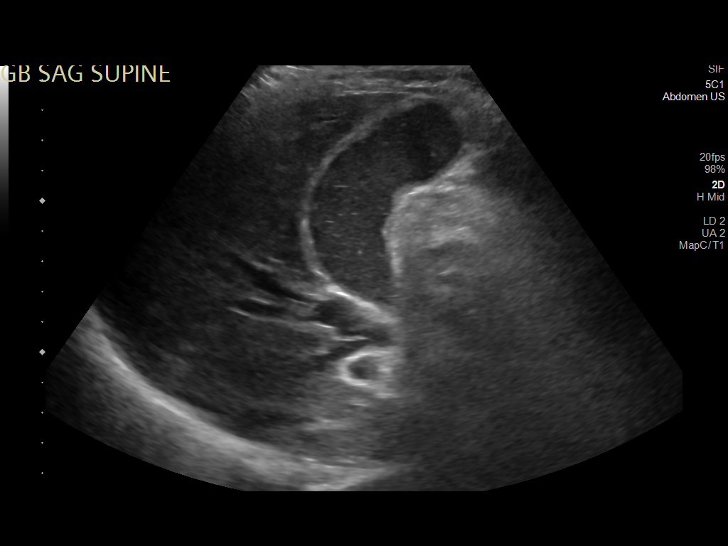
[im 8/46]
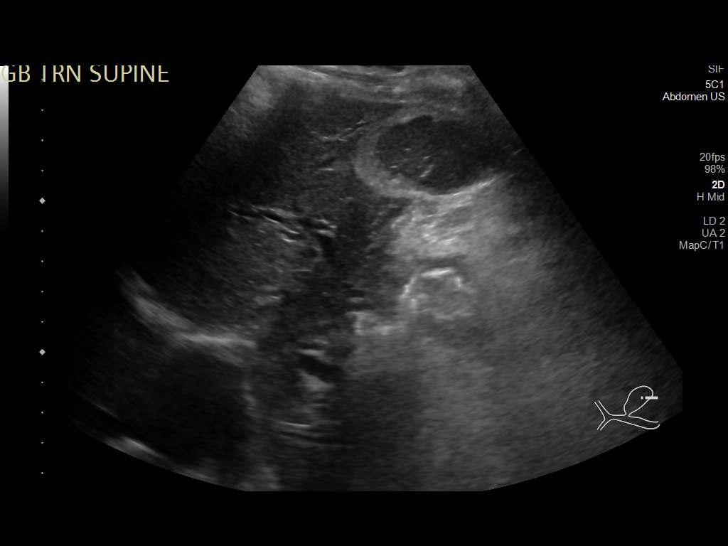
[im 12/46]
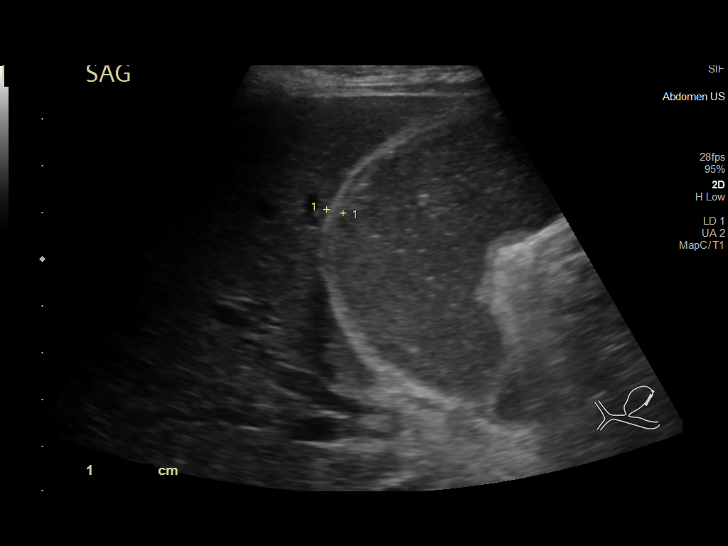
[im 16/46]
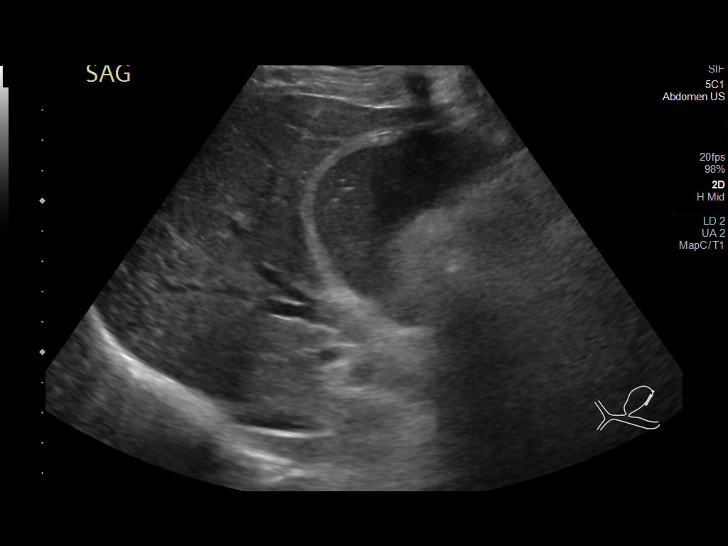
[im 17/46]
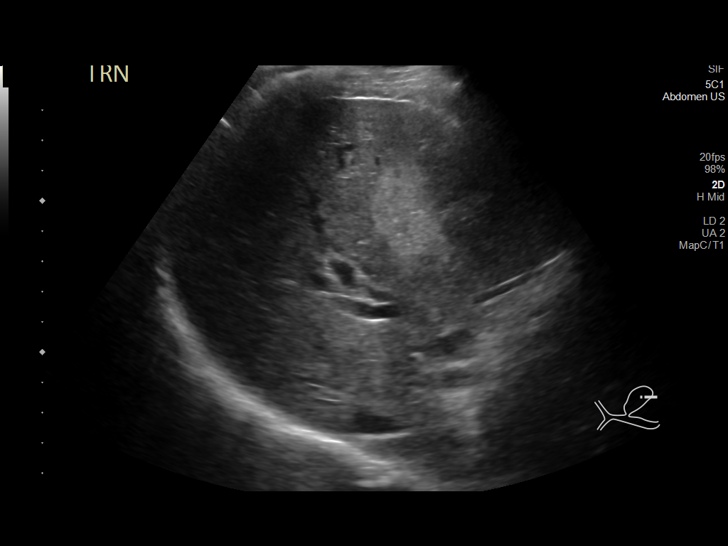
[im 21/46]
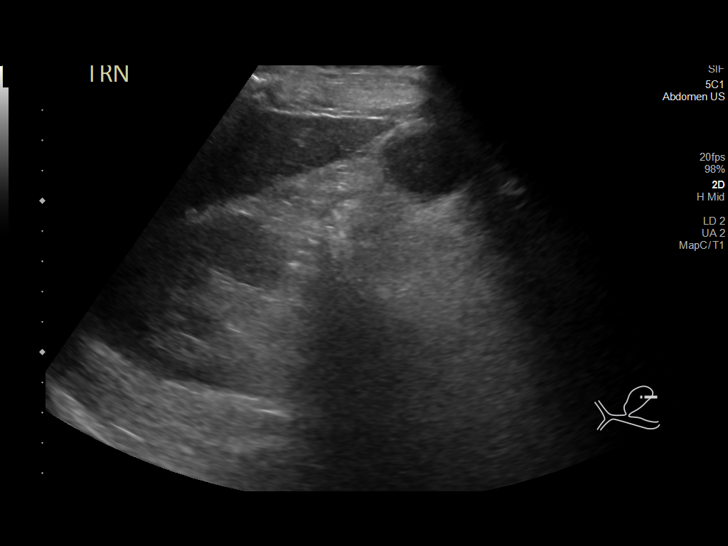
[im 25/46]
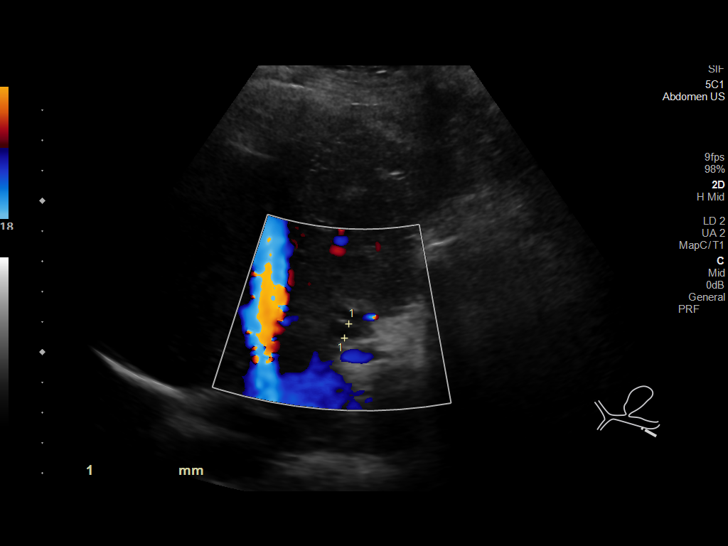
[im 29/46]
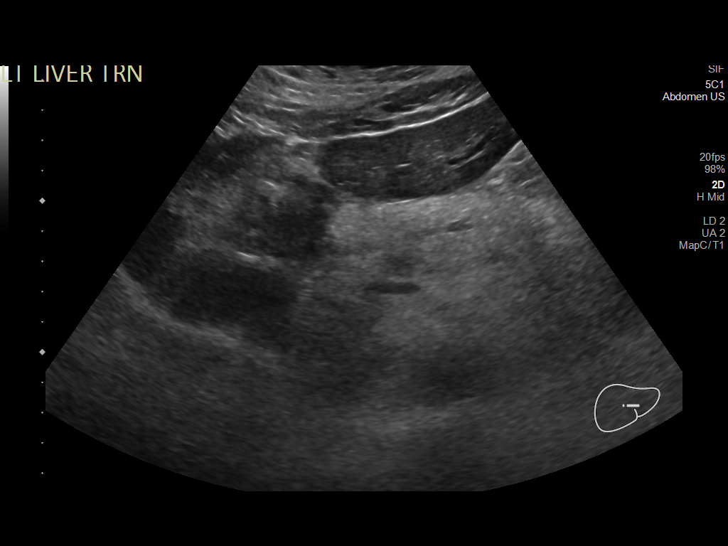
[im 31/46]
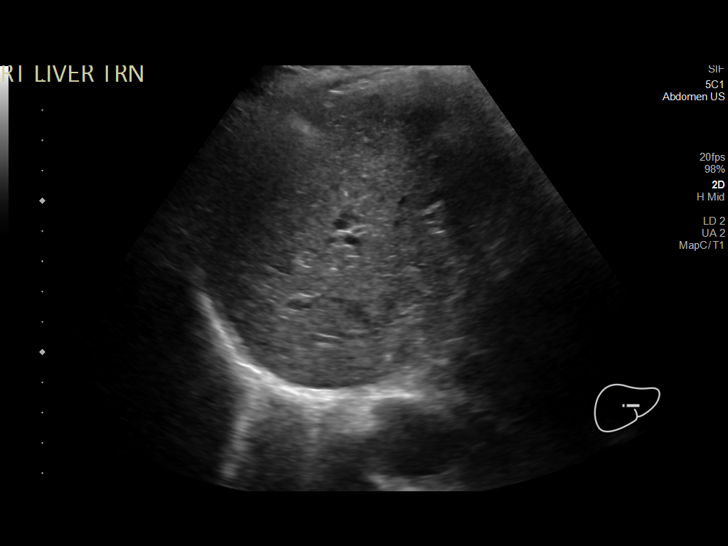
[im 34/46]
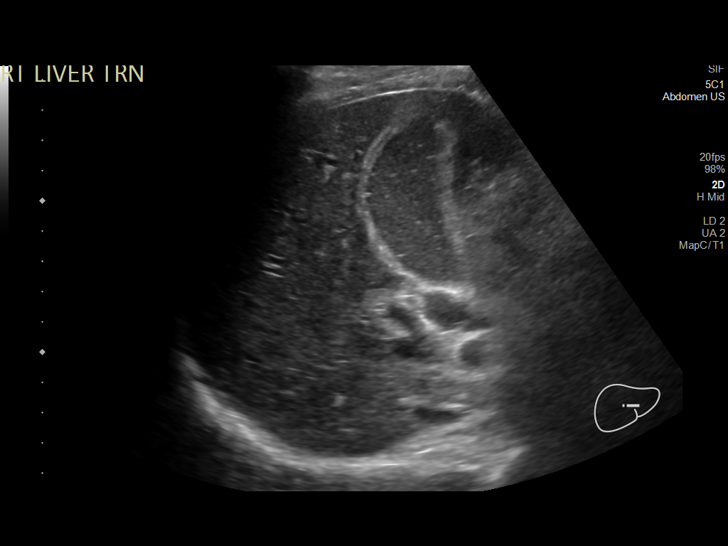
[im 38/46]
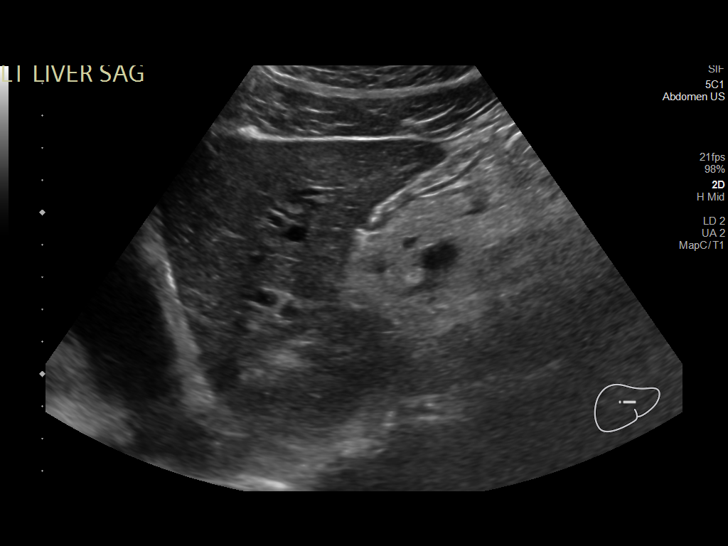
[im 42/46]
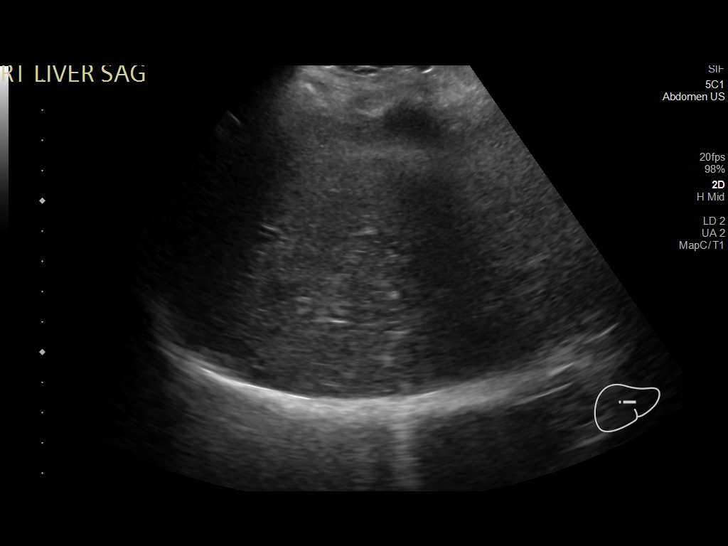
[im 46/46]
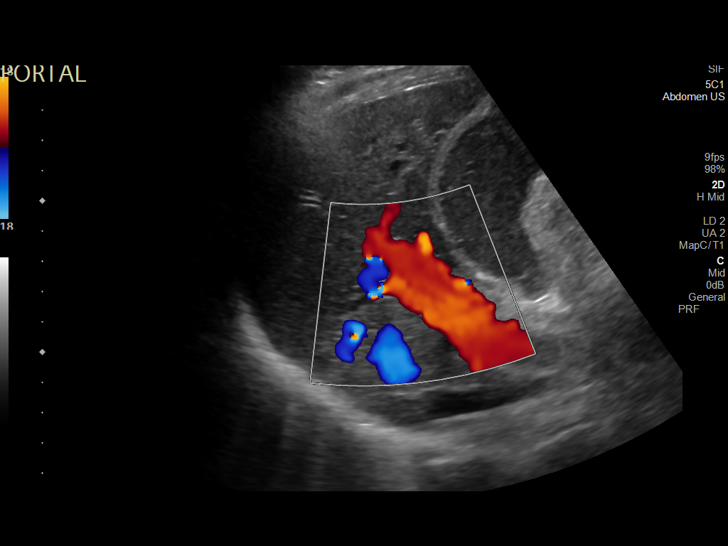

[14 of 25 positions shown; findings below may reference images not displayed]

FINDINGS: Gallbladder:

Nondistended, sludge-filled gallbladder with gallbladder wall
measuring near the upper limit of normal in thickness, at 0.4 cm. No
sonographic Murphy sign noted by sonographer.

Common bile duct:

Diameter: 0.6 cm

Liver:

No focal lesion identified. Within normal limits in parenchymal
echogenicity. Portal vein is patent on color Doppler imaging with
normal direction of blood flow towards the liver.

Other: No ascites.
IMPRESSION: Biliary sludge without additional sonographic evidence of acute
cholecystitis.

If concern for biliary dyskinesia, recommend NM HIDA for further
evaluation.

## 2023-02-01 NOTE — Telephone Encounter (Signed)
Telephone call
